# Patient Record
Sex: Male | Born: 1955 | Race: Black or African American | Hispanic: No | Marital: Married | Smoking: Former smoker
Health system: Southern US, Community
[De-identification: ages and names within clinical notes are randomized; demographics above are authoritative.]

## PROBLEM LIST (undated history)

## (undated) DIAGNOSIS — E785 Hyperlipidemia, unspecified: Secondary | ICD-10-CM

## (undated) DIAGNOSIS — Z794 Long term (current) use of insulin: Secondary | ICD-10-CM

## (undated) DIAGNOSIS — IMO0001 Reserved for inherently not codable concepts without codable children: Secondary | ICD-10-CM

## (undated) DIAGNOSIS — I700 Atherosclerosis of aorta: Secondary | ICD-10-CM

## (undated) DIAGNOSIS — R6882 Decreased libido: Secondary | ICD-10-CM

## (undated) DIAGNOSIS — I480 Paroxysmal atrial fibrillation: Secondary | ICD-10-CM

## (undated) DIAGNOSIS — E669 Obesity, unspecified: Secondary | ICD-10-CM

## (undated) DIAGNOSIS — Z9289 Personal history of other medical treatment: Secondary | ICD-10-CM

## (undated) DIAGNOSIS — I10 Essential (primary) hypertension: Secondary | ICD-10-CM

## (undated) DIAGNOSIS — E119 Type 2 diabetes mellitus without complications: Secondary | ICD-10-CM

## (undated) DIAGNOSIS — I5189 Other ill-defined heart diseases: Secondary | ICD-10-CM

## (undated) DIAGNOSIS — G4730 Sleep apnea, unspecified: Secondary | ICD-10-CM

## (undated) DIAGNOSIS — J029 Acute pharyngitis, unspecified: Secondary | ICD-10-CM

## (undated) DIAGNOSIS — D497 Neoplasm of unspecified behavior of endocrine glands and other parts of nervous system: Secondary | ICD-10-CM

## (undated) DIAGNOSIS — R079 Chest pain, unspecified: Secondary | ICD-10-CM

## (undated) HISTORY — DX: Long term (current) use of insulin: Z79.4

## (undated) HISTORY — DX: Sleep apnea, unspecified: G47.30

## (undated) HISTORY — DX: Reserved for inherently not codable concepts without codable children: IMO0001

## (undated) HISTORY — PX: AV FISTULA REPAIR: SHX563

## (undated) HISTORY — DX: Other ill-defined heart diseases: I51.89

## (undated) HISTORY — DX: Personal history of other medical treatment: Z92.89

## (undated) HISTORY — DX: Hyperlipidemia, unspecified: E78.5

## (undated) HISTORY — DX: Obesity, unspecified: E66.9

## (undated) HISTORY — DX: Type 2 diabetes mellitus without complications: E11.9

## (undated) HISTORY — DX: Paroxysmal atrial fibrillation: I48.0

## (undated) HISTORY — DX: Atherosclerosis of aorta: I70.0

## (undated) HISTORY — DX: Acute pharyngitis, unspecified: J02.9

## (undated) HISTORY — PX: CARDIAC CATHETERIZATION: SHX172

## (undated) HISTORY — DX: Neoplasm of unspecified behavior of endocrine glands and other parts of nervous system: D49.7

## (undated) HISTORY — DX: Essential (primary) hypertension: I10

## (undated) HISTORY — DX: Decreased libido: R68.82

## (undated) HISTORY — DX: Chest pain, unspecified: R07.9

---

## 2005-12-29 ENCOUNTER — Ambulatory Visit: Payer: Self-pay

## 2007-02-14 DIAGNOSIS — I10 Essential (primary) hypertension: Secondary | ICD-10-CM | POA: Insufficient documentation

## 2008-10-01 ENCOUNTER — Ambulatory Visit: Payer: Self-pay | Admitting: Gastroenterology

## 2010-07-31 ENCOUNTER — Ambulatory Visit: Payer: Self-pay | Admitting: Family Medicine

## 2011-05-18 HISTORY — PX: COLONOSCOPY: SHX174

## 2011-11-30 ENCOUNTER — Emergency Department: Payer: Self-pay | Admitting: Emergency Medicine

## 2011-11-30 LAB — BASIC METABOLIC PANEL
Anion Gap: 10 (ref 7–16)
BUN: 21 mg/dL — ABNORMAL HIGH (ref 7–18)
Co2: 22 mmol/L (ref 21–32)
Creatinine: 1.41 mg/dL — ABNORMAL HIGH (ref 0.60–1.30)
EGFR (African American): >60
EGFR (Non-African Amer.): 56 — ABNORMAL LOW
Glucose: 308 mg/dL — ABNORMAL HIGH (ref 65–99)
Osmolality: 285 (ref 275–301)

## 2011-11-30 LAB — CBC
HGB: 13.2 g/dL (ref 13.0–18.0)
MCHC: 31.8 g/dL — ABNORMAL LOW (ref 32.0–36.0)
MCV: 88 fL (ref 80–100)
Platelet: 211 10*3/uL (ref 150–440)
RDW: 11.9 % (ref 11.5–14.5)
WBC: 7.2 10*3/uL (ref 3.8–10.6)

## 2011-11-30 LAB — CK TOTAL AND CKMB (NOT AT ARMC): CK, Total: 388 U/L — ABNORMAL HIGH (ref 35–232)

## 2012-04-06 ENCOUNTER — Ambulatory Visit: Payer: Self-pay | Admitting: Family Medicine

## 2012-10-10 ENCOUNTER — Ambulatory Visit: Payer: Self-pay | Admitting: Family Medicine

## 2012-10-10 LAB — CREATININE, SERUM
Creatinine: 1.23 mg/dL (ref 0.60–1.30)
EGFR (African American): >60
EGFR (Non-African Amer.): >60

## 2012-10-23 ENCOUNTER — Ambulatory Visit: Payer: Self-pay | Admitting: Anesthesiology

## 2012-10-23 LAB — CBC
HCT: 40.1 % (ref 40.0–52.0)
HGB: 13.4 g/dL (ref 13.0–18.0)
MCH: 28.4 pg (ref 26.0–34.0)
MCHC: 33.4 g/dL (ref 32.0–36.0)
MCV: 85 fL (ref 80–100)
Platelet: 266 10*3/uL (ref 150–440)

## 2012-10-23 LAB — BASIC METABOLIC PANEL
Anion Gap: 8 (ref 7–16)
BUN: 15 mg/dL (ref 7–18)
Calcium, Total: 9.3 mg/dL (ref 8.5–10.1)
Chloride: 103 mmol/L (ref 98–107)
Creatinine: 1.18 mg/dL (ref 0.60–1.30)
EGFR (African American): >60
EGFR (Non-African Amer.): >60
Glucose: 287 mg/dL — ABNORMAL HIGH (ref 65–99)
Osmolality: 281 (ref 275–301)
Sodium: 135 mmol/L — ABNORMAL LOW (ref 136–145)

## 2012-10-26 ENCOUNTER — Ambulatory Visit: Payer: Self-pay | Admitting: Surgery

## 2012-10-27 LAB — PATHOLOGY REPORT

## 2014-04-22 ENCOUNTER — Emergency Department: Payer: Self-pay | Admitting: Emergency Medicine

## 2014-04-22 LAB — CBC
HCT: 43 % (ref 40.0–52.0)
HGB: 14 g/dL (ref 13.0–18.0)
MCH: 28.7 pg (ref 26.0–34.0)
MCHC: 32.6 g/dL (ref 32.0–36.0)
MCV: 88 fL (ref 80–100)
Platelet: 232 10*3/uL (ref 150–440)
RBC: 4.9 10*6/uL (ref 4.40–5.90)
RDW: 12.2 % (ref 11.5–14.5)
WBC: 7.8 10*3/uL (ref 3.8–10.6)

## 2014-04-22 LAB — BASIC METABOLIC PANEL
Anion Gap: 12 (ref 7–16)
BUN: 15 mg/dL (ref 7–18)
CALCIUM: 9 mg/dL (ref 8.5–10.1)
CHLORIDE: 95 mmol/L — AB (ref 98–107)
Co2: 25 mmol/L (ref 21–32)
Creatinine: 1.19 mg/dL (ref 0.60–1.30)
EGFR (Non-African Amer.): >60
Glucose: 284 mg/dL — ABNORMAL HIGH (ref 65–99)
Osmolality: 276 (ref 275–301)
POTASSIUM: 3.5 mmol/L (ref 3.5–5.1)
Sodium: 132 mmol/L — ABNORMAL LOW (ref 136–145)

## 2014-04-22 LAB — TROPONIN I: Troponin-I: <0.02 ng/mL

## 2014-04-26 ENCOUNTER — Encounter: Payer: Self-pay | Admitting: *Deleted

## 2014-05-03 ENCOUNTER — Ambulatory Visit: Payer: Self-pay | Admitting: Cardiovascular Disease

## 2014-05-16 ENCOUNTER — Encounter: Payer: Self-pay | Admitting: *Deleted

## 2014-05-23 ENCOUNTER — Ambulatory Visit (INDEPENDENT_AMBULATORY_CARE_PROVIDER_SITE_OTHER): Payer: BLUE CROSS/BLUE SHIELD | Admitting: Cardiovascular Disease

## 2014-05-23 ENCOUNTER — Encounter (INDEPENDENT_AMBULATORY_CARE_PROVIDER_SITE_OTHER): Payer: Self-pay

## 2014-05-23 ENCOUNTER — Telehealth: Payer: Self-pay | Admitting: *Deleted

## 2014-05-23 ENCOUNTER — Encounter: Payer: Self-pay | Admitting: Cardiovascular Disease

## 2014-05-23 VITALS — BP 130/80 | HR 84 | Ht 75.0 in | Wt 275.2 lb

## 2014-05-23 DIAGNOSIS — G4733 Obstructive sleep apnea (adult) (pediatric): Secondary | ICD-10-CM | POA: Insufficient documentation

## 2014-05-23 DIAGNOSIS — E785 Hyperlipidemia, unspecified: Secondary | ICD-10-CM

## 2014-05-23 DIAGNOSIS — R079 Chest pain, unspecified: Secondary | ICD-10-CM

## 2014-05-23 DIAGNOSIS — G4730 Sleep apnea, unspecified: Secondary | ICD-10-CM

## 2014-05-23 DIAGNOSIS — I10 Essential (primary) hypertension: Secondary | ICD-10-CM | POA: Insufficient documentation

## 2014-05-23 NOTE — Progress Notes (Signed)
Primary care physician: Dr. Rutherford Nail  HPI  This is a pleasant 59 year old African-American male who is here today for evaluation of chest pain. He has no previous cardiac history. He has chronic medical conditions that include type 2 diabetes diagnosed in 2000, hypertension, hyperlipidemia, obesity and sleep apnea. He used his CPAP machine in the past but stopped doing so after losing about 30 pounds. He went to the emergency room before Christmas for chest pain and generalized cramps. The chest pain is described as numbness, heavy feeling and occasional sharp discomfort substernally radiating to his left arm. The pain happen at rest and not with physical activities. He does complain of significant exertional fatigue and occasional shortness of breath. No orthopnea or PND. He does complain of feeling his pulse in his ears but no palpitations or tachycardia. His symptoms started after he was started on chlorthalidone. He went to the emergency room at [facility] his labs were unremarkable except for mild hyponatremia at 132 and borderline low potassium at 3.5. Cardiac enzymes were negative and EKG shows no acute changes. He was subsequently switched from chlorthalidone to [facility]. There is no family history of coronary artery disease. He is not a smoker.   Allergies  Allergen Reactions  . Atorvastatin   . Statins      Current Outpatient Prescriptions on File Prior to Visit  Medication Sig Dispense Refill  . diltiazem (CARDIZEM CD) 120 MG 24 hr capsule Take 120 mg by mouth daily.    . fluticasone (FLONASE) 50 MCG/ACT nasal spray Place 2 sprays into both nostrils as needed.     Marland Kitchen glipiZIDE (GLUCOTROL) 10 MG tablet Take 10 mg by mouth 2 (two) times daily before a meal.    . Liraglutide (VICTOZA) 18 MG/3ML SOPN Inject into the skin daily.    Marland Kitchen losartan (COZAAR) 100 MG tablet Take 100 mg by mouth daily.    . metFORMIN (GLUCOPHAGE) 1000 MG tablet Take 1,000 mg by mouth 2 (two) times daily with a meal.    .  sildenafil (VIAGRA) 100 MG tablet Take 100 mg by mouth daily as needed for erectile dysfunction.    Marland Kitchen zolpidem (AMBIEN) 10 MG tablet Take 10 mg by mouth at bedtime.     No current facility-administered medications on file prior to visit.     Past Medical History  Diagnosis Date  . Pharyngitis   . Hypertension   . Diabetes mellitus without complication   . Decreased libido      Past Surgical History  Procedure Laterality Date  . Colonoscopy    . Av fistula repair    . Cardiac catheterization      [facility]      Family History  Problem Relation Age of Onset  . Breast cancer Mother   . Kidney failure Father      History   Social History  . Marital Status: Married    Spouse Name: N/A    Number of Children: N/A  . Years of Education: N/A   Occupational History  . Not on file.   Social History Main Topics  . Smoking status: Never Smoker   . Smokeless tobacco: Not on file  . Alcohol Use: No  . Drug Use: No  . Sexual Activity: Not on file   Other Topics Concern  . Not on file   Social History Narrative     ROS A 10 point review of system was performed. It is negative other than that mentioned in the history of present illness.  PHYSICAL EXAM   BP 130/80 mmHg  Pulse 84  Ht 6\' 3"  (1.905 m)  Wt 275 lb 4 oz (124.853 kg)  BMI 34.40 kg/m2 Constitutional: He is oriented to person, place, and time. He appears well-developed and well-nourished. No distress.  HENT: No nasal discharge.  Head: Normocephalic and atraumatic.  Eyes: Pupils are equal and round.  No discharge. Neck: Normal range of motion. Neck supple. No JVD present. No thyromegaly present.  Cardiovascular: Normal rate, regular rhythm, normal heart sounds. Exam reveals no gallop and no friction rub. No murmur heard.  Pulmonary/Chest: Effort normal and breath sounds normal. No stridor. No respiratory distress. He has no wheezes. He has no rales. He exhibits no tenderness.  Abdominal: Soft. Bowel sounds  are normal. He exhibits no distension. There is no tenderness. There is no rebound and no guarding.  Musculoskeletal: Normal range of motion. He exhibits no edema and no tenderness.  Neurological: He is alert and oriented to person, place, and time. Coordination normal.  Skin: Skin is warm and dry. No rash noted. He is not diaphoretic. No erythema. No pallor.  Psychiatric: He has a normal mood and affect. His behavior is normal. Judgment and thought content normal.       NBZ:XYDSW  Rhythm  -  Diffuse nonspecific T-abnormality.   ABNORMAL     ASSESSMENT AND PLAN

## 2014-05-23 NOTE — Assessment & Plan Note (Signed)
Blood pressure is well controlled on current medications. 

## 2014-05-23 NOTE — Telephone Encounter (Signed)
Informed patient his stress test will be 05/29/14 Arrive at 0745 am  Patient verbalized understanding

## 2014-05-23 NOTE — Assessment & Plan Note (Signed)
His chest pain is mostly atypical. EKG does not show acute ischemic changes but he does have nonspecific T wave changes. He does have multiple risk factors for coronary artery disease and thus I recommend evaluation with a treadmill nuclear stress test. I discussed with the patient the importance of lifestyle changes in order to decrease the chance of future coronary artery disease and cardiovascular events. We discussed the importance of controlling risk factors, healthy diet as well as regular exercise. I also explained to him that a normal stress test does not rule out atherosclerosis.

## 2014-05-23 NOTE — Patient Instructions (Addendum)
[place]  Your caregiver has ordered a Stress Test with nuclear imaging. The purpose of this test is to evaluate the blood supply to your heart muscle. This procedure is referred to as a "Non-Invasive Stress Test." This is because other than having an IV started in your vein, nothing is inserted or "invades" your body. Cardiac stress tests are done to find areas of poor blood flow to the heart by determining the extent of coronary artery disease (CAD). Some patients exercise on a treadmill, which naturally increases the blood flow to your heart, while others who are  unable to walk on a treadmill due to physical limitations have a pharmacologic/chemical stress agent called Lexiscan . This medicine will mimic walking on a treadmill by temporarily increasing your coronary blood flow.   Please note: these test may take anywhere between 2-4 hours to complete  PLEASE REPORT TO [facility] AT THE FIRST DESK WILL DIRECT YOU WHERE TO GO  Date of Procedure:______01/13/16_______________________________  Arrival Time for Procedure:______0745am________________________  Instructions regarding medication:   __x__ : Hold diabetes medication morning of procedure  __x__:  Hold Diltiazem the morning of your procedure    PLEASE NOTIFY THE OFFICE AT LEAST 24 HOURS IN ADVANCE IF YOU ARE UNABLE TO KEEP YOUR APPOINTMENT.  [phone] AND  PLEASE NOTIFY NUCLEAR MEDICINE AT [facility] AT LEAST 24 HOURS IN ADVANCE IF YOU ARE UNABLE TO KEEP YOUR APPOINTMENT. [phone]  How to prepare for your Myoview test:  1. Do not eat or drink after midnight 2. No caffeine for 24 hours prior to test 3. No smoking 24 hours prior to test. 4. Your medication may be taken with water.  If your doctor stopped a medication because of this test, do not take that medication. 5. Ladies, please do not wear dresses.  Skirts or pants are appropriate. Please wear a short sleeve shirt. 6. No perfume, cologne  or lotion. 7. Wear comfortable walking shoes. No heels!        Your physician recommends that you schedule a follow-up appointment in:  As needed

## 2014-05-29 ENCOUNTER — Ambulatory Visit: Payer: Self-pay | Admitting: Cardiovascular Disease

## 2014-05-29 DIAGNOSIS — R079 Chest pain, unspecified: Secondary | ICD-10-CM

## 2014-05-30 ENCOUNTER — Other Ambulatory Visit: Payer: Self-pay

## 2014-05-30 DIAGNOSIS — R079 Chest pain, unspecified: Secondary | ICD-10-CM

## 2014-06-27 LAB — BASIC METABOLIC PANEL: GLUCOSE: 142 mg/dL

## 2014-06-27 LAB — HEMOGLOBIN A1C: Hgb A1c MFr Bld: 7.5 % — AB (ref 4.0–6.0)

## 2014-09-06 NOTE — Op Note (Signed)
PATIENT NAME:  CASSELL, VOORHIES MR#:  [id number] DATE OF BIRTH:  06-26-1955  DATE OF PROCEDURE:  10/26/2012  PREOPERATIVE DIAGNOSIS: Headaches, refractory.  POSTOPERATIVE DIAGNOSIS: Headaches, refractory.  PROCEDURE:  Right temporal artery biopsy.   SURGEON: Marlyce Huge, MD  ANESTHESIA:  MAC local.   ESTIMATED BLOOD LOSS: 5 mL.  COMPLICATIONS: None.   SPECIMENS: Temporal artery.   INDICATION FOR SURGERY: Mr. Gracey is a pleasant 59 year old male with a history of right-sided headaches which he described as severe which were slightly improved with prednisone, but which recurred after he was taken off prednisone. He was thus referred for temporal artery biopsy.   DETAILS OF PROCEDURE: Informed consent was obtained. The patient was brought to the operating room suite. He was laid on the operating room table and given mild sedation. His right face was prepped and draped in standard surgical fashion. A timeout was then performed correctly identifying the patient name, operative site and procedure to be performed. Doppler ultrasound was used to isolate his temporal artery. I made an incision over the artery just above the tragus and this was deepened down to the temporal artery. I dissected out approximately 1 cm of temporal artery, tied off all side branches, placed two 3-0 silk ties proximally and distally and tied these.  The specimen was then cut off from the tied ends and sent to pathology. I then irrigated the wound. It was hemostatic. I then closed it with a layer of 4-0 Monocryl deep dermals and then placed Dermabond over the wound. The patient was then awoken, extubated and brought to the postanesthesia care unit. There were no immediate complications. Needle, sponge and instrument counts were correct at the end of the procedure.   ____________________________ Glena Norfolk. Jordon Bourquin, MD cal:sb D: 10/27/2012 09:07:08 ET T: 10/27/2012 09:27:48 ET JOB#: [id number]  cc: Harrell Gave  A. Kaydan Wong, MD, <Dictator> Floyde Parkins MD ELECTRONICALLY SIGNED 11/02/2012 16:53

## 2014-09-09 ENCOUNTER — Encounter: Payer: Self-pay | Admitting: Emergency Medicine

## 2014-10-24 ENCOUNTER — Encounter: Payer: Self-pay | Admitting: Family Medicine

## 2014-10-24 ENCOUNTER — Ambulatory Visit (INDEPENDENT_AMBULATORY_CARE_PROVIDER_SITE_OTHER): Payer: 59 | Admitting: Family Medicine

## 2014-10-24 ENCOUNTER — Other Ambulatory Visit: Payer: Self-pay | Admitting: Family Medicine

## 2014-10-24 VITALS — BP 159/86 | HR 91 | Temp 98.2°F | Resp 18 | Ht 73.0 in | Wt 279.1 lb

## 2014-10-24 DIAGNOSIS — E1165 Type 2 diabetes mellitus with hyperglycemia: Secondary | ICD-10-CM

## 2014-10-24 DIAGNOSIS — I10 Essential (primary) hypertension: Secondary | ICD-10-CM | POA: Diagnosis not present

## 2014-10-24 DIAGNOSIS — G4700 Insomnia, unspecified: Secondary | ICD-10-CM

## 2014-10-24 DIAGNOSIS — N529 Male erectile dysfunction, unspecified: Secondary | ICD-10-CM

## 2014-10-24 DIAGNOSIS — IMO0002 Reserved for concepts with insufficient information to code with codable children: Secondary | ICD-10-CM

## 2014-10-24 DIAGNOSIS — E785 Hyperlipidemia, unspecified: Secondary | ICD-10-CM

## 2014-10-24 DIAGNOSIS — F419 Anxiety disorder, unspecified: Secondary | ICD-10-CM

## 2014-10-24 DIAGNOSIS — N5201 Erectile dysfunction due to arterial insufficiency: Secondary | ICD-10-CM | POA: Insufficient documentation

## 2014-10-24 LAB — POCT GLYCOSYLATED HEMOGLOBIN (HGB A1C): HEMOGLOBIN A1C: 9.3

## 2014-10-24 LAB — GLUCOSE, POCT (MANUAL RESULT ENTRY): POC Glucose: 255 mg/dl — AB (ref 70–99)

## 2014-10-24 MED ORDER — TADALAFIL 20 MG PO TABS: 10.0000 mg | ORAL_TABLET | Freq: Every day | ORAL | Status: DC | PRN

## 2014-10-24 MED ORDER — LORAZEPAM 0.5 MG PO TABS
0.5000 mg | ORAL_TABLET | Freq: Two times a day (BID) | ORAL | Status: DC | PRN
Start: 2014-10-24 — End: 2015-05-01

## 2014-10-24 MED ORDER — TRIAMTERENE-HCTZ 37.5-25 MG PO TABS: 1.0000 | ORAL_TABLET | Freq: Every day | ORAL | Status: DC

## 2014-10-24 MED ORDER — TADALAFIL 20 MG PO TABS: 10.0000 mg | ORAL_TABLET | ORAL | Status: DC | PRN

## 2014-10-24 MED ORDER — LOSARTAN POTASSIUM 100 MG PO TABS: 100.0000 mg | ORAL_TABLET | Freq: Every day | ORAL | Status: DC

## 2014-10-24 MED ORDER — PRAVASTATIN SODIUM 40 MG PO TABS: 40.0000 mg | ORAL_TABLET | Freq: Every day | ORAL | Status: DC

## 2014-10-24 MED ORDER — LIRAGLUTIDE 18 MG/3ML ~~LOC~~ SOPN: 1.0000 mg | PEN_INJECTOR | Freq: Every day | SUBCUTANEOUS | Status: DC

## 2014-10-24 MED ORDER — OMEPRAZOLE 20 MG PO CPDR: 20.0000 mg | DELAYED_RELEASE_CAPSULE | Freq: Every morning | ORAL | Status: DC

## 2014-10-24 MED ORDER — ZOLPIDEM TARTRATE 10 MG PO TABS: 10.0000 mg | ORAL_TABLET | Freq: Every day | ORAL | Status: DC

## 2014-10-24 MED ORDER — GLIPIZIDE 10 MG PO TABS: 10.0000 mg | ORAL_TABLET | Freq: Two times a day (BID) | ORAL | Status: DC

## 2014-10-24 MED ORDER — INDAPAMIDE 2.5 MG PO TABS
2.5000 mg | ORAL_TABLET | Freq: Every day | ORAL | Status: DC
Start: 2014-10-24 — End: 2015-09-26

## 2014-10-24 MED ORDER — METFORMIN HCL 1000 MG PO TABS: 1000.0000 mg | ORAL_TABLET | Freq: Two times a day (BID) | ORAL | Status: DC

## 2014-10-24 MED ORDER — INDAPAMIDE 2.5 MG PO TABS: 2.5000 mg | ORAL_TABLET | Freq: Every day | ORAL | Status: DC

## 2014-10-24 NOTE — Progress Notes (Signed)
Name: Marvin KIRKER Sr.   MRN: [id number]    DOB: 13-Dec-1955   Date:10/24/2014       Progress Note  Subjective  Chief Complaint  Chief Complaint  Patient presents with  . Hypertension  . Hyperlipidemia  . Insomnia  . Diabetes    Hypertension This is a chronic problem. The current episode started more than 1 year ago. The problem has been gradually worsening since onset. Associated symptoms include anxiety, chest pain, headaches and peripheral edema. Pertinent negatives include no blurred vision, neck pain, orthopnea, palpitations or shortness of breath. Risk factors for coronary artery disease include diabetes mellitus, dyslipidemia, obesity, male gender, stress and sedentary lifestyle. Past treatments include calcium channel blockers, beta blockers, diuretics and angiotensin blockers. The current treatment provides mild improvement. Compliance problems include medication cost and psychosocial issues.  Hypertensive end-organ damage includes kidney disease.  Hyperlipidemia This is a chronic problem. The current episode started more than 1 year ago. The problem is uncontrolled. Recent lipid tests were reviewed and are high. Exacerbating diseases include diabetes and obesity. Associated symptoms include chest pain. Pertinent negatives include no focal weakness, myalgias or shortness of breath. Current antihyperlipidemic treatment includes statins. The current treatment provides mild improvement of lipids. Compliance problems include medication cost.  Risk factors for coronary artery disease include diabetes mellitus, dyslipidemia, hypertension, obesity, a sedentary lifestyle and stress.  Diabetes He presents for his follow-up diabetic visit. He has type 2 diabetes mellitus. His disease course has been worsening. Hypoglycemia symptoms include headaches and nervousness/anxiousness. Pertinent negatives for hypoglycemia include no dizziness, seizures or tremors. Associated symptoms include chest pain.  Pertinent negatives for diabetes include no blurred vision, no weakness and no weight loss. Risk factors for coronary artery disease include diabetes mellitus, hypertension and sedentary lifestyle. Current diabetic treatment includes oral agent (dual therapy). He is compliant with treatment some of the time. He rarely participates in exercise. His home blood glucose trend is increasing steadily. An ACE inhibitor/angiotensin II receptor blocker is being taken.  Anxiety Presents for initial visit. Onset was 1 to 6 months ago. The problem has been gradually worsening. Symptoms include chest pain and nervous/anxious behavior. Patient reports no dizziness, insomnia, nausea, palpitations or shortness of breath. Symptoms occur occasionally. The severity of symptoms is moderate. The symptoms are aggravated by caffeine. The quality of sleep is fair.   Past treatments include nothing.   OBESITY Patient continues to be noncompliant with diet and exercise  ANXIETY Significant stressors recent as patient has been terminated from his job. He is currently seeking employment elsewhere. He has manifestations of anxiety and insomnia     Past Medical History  Diagnosis Date  . Pharyngitis   . Hypertension   . Diabetes mellitus without complication   . Decreased libido     History  Substance Use Topics  . Smoking status: Former Research scientist (life sciences)  . Smokeless tobacco: Not on file  . Alcohol Use: 0.0 oz/week    0 Standard drinks or equivalent per week     Comment: previously but not currently     Current outpatient prescriptions:  .  aspirin 81 MG tablet, Take 81 mg by mouth daily., Disp: , Rfl:  .  Cholecalciferol (VITAMIN D) 2000 UNITS tablet, Take 2,000 Units by mouth daily., Disp: , Rfl:  .  Flaxseed, Linseed, (FLAXSEED OIL) 1000 MG CAPS, Take by mouth daily., Disp: , Rfl:  .  fluticasone (FLONASE) 50 MCG/ACT nasal spray, Place 2 sprays into both nostrils as needed. , Disp: ,  Rfl:  .  glipiZIDE (GLUCOTROL)  10 MG tablet, Take 10 mg by mouth 2 (two) times daily before a meal., Disp: , Rfl:  .  Liraglutide (VICTOZA) 18 MG/3ML SOPN, Inject into the skin daily., Disp: , Rfl:  .  losartan (COZAAR) 100 MG tablet, Take 100 mg by mouth daily., Disp: , Rfl:  .  metFORMIN (GLUCOPHAGE) 1000 MG tablet, Take 1,000 mg by mouth 2 (two) times daily with a meal., Disp: , Rfl:  .  Omega-3 Fatty Acids (FISH OIL) 1000 MG CAPS, Take by mouth daily., Disp: , Rfl:  .  omeprazole (PRILOSEC) 20 MG capsule, TAKE ONE CAPSULE BY MOUTH IN THE MORNING, Disp: 30 capsule, Rfl: 11 .  pravastatin (PRAVACHOL) 40 MG tablet, Take 40 mg by mouth daily., Disp: , Rfl:  .  triamterene-hydrochlorothiazide (MAXZIDE-25) 37.5-25 MG per tablet, Take 1 tablet by mouth daily., Disp: , Rfl:  .  zolpidem (AMBIEN) 10 MG tablet, Take 10 mg by mouth at bedtime., Disp: , Rfl:  .  diltiazem (CARDIZEM CD) 120 MG 24 hr capsule, Take 120 mg by mouth daily., Disp: , Rfl:  .  sildenafil (VIAGRA) 100 MG tablet, Take 100 mg by mouth daily as needed for erectile dysfunction., Disp: , Rfl:   Allergies  Allergen Reactions  . Atorvastatin Other (See Comments)    Statins cause headaches and muscle aches    Review of Systems  Constitutional: Negative for fever, chills and weight loss.  HENT: Negative for congestion, hearing loss, sore throat and tinnitus.   Eyes: Negative for blurred vision, double vision and redness.  Respiratory: Negative for cough, hemoptysis and shortness of breath.   Cardiovascular: Positive for chest pain. Negative for palpitations, orthopnea, claudication and leg swelling.  Gastrointestinal: Negative for heartburn, nausea, vomiting, diarrhea, constipation and blood in stool.  Genitourinary: Negative for dysuria, urgency, frequency and hematuria.  Musculoskeletal: Negative for myalgias, back pain, joint pain, falls and neck pain.  Skin: Negative for itching.  Neurological: Positive for headaches. Negative for dizziness, tingling,  tremors, focal weakness, seizures, loss of consciousness and weakness. Sensory change: in chest.  Endo/Heme/Allergies: Does not bruise/bleed easily.  Psychiatric/Behavioral: Negative for depression and substance abuse. The patient is nervous/anxious. The patient does not have insomnia.      Objective  Filed Vitals:   10/24/14 1517  BP: 159/86  Pulse: 91  Temp: 98.2 F (36.8 C)  Resp: 18  Height: 6\' 1"  (1.854 m)  Weight: 279 lb 1 oz (126.582 kg)  SpO2: 97%     Physical Exam  Constitutional: He is oriented to person, place, and time and well-developed, well-nourished, and in no distress.  HENT:  Head: Normocephalic.  Eyes: EOM are normal. Pupils are equal, round, and reactive to light.  Neck: Normal range of motion. Neck supple. No thyromegaly present.  Cardiovascular: Normal rate, regular rhythm and normal heart sounds.   No murmur heard. Pulmonary/Chest: Effort normal and breath sounds normal. No respiratory distress. He has no wheezes.  Abdominal: Soft. Bowel sounds are normal.  Musculoskeletal: Normal range of motion. He exhibits no edema.  Lymphadenopathy:    He has no cervical adenopathy.  Neurological: He is alert and oriented to person, place, and time. No cranial nerve deficit. Gait normal. Coordination normal.  Skin: Skin is warm and dry. No rash noted.  Psychiatric: Affect and judgment normal.      Assessment & Plan  1. DM (diabetes mellitus), type 2, uncontrolled Continue compliance with diet and exercise to some degree medication -  POCT HgB A1C - POCT Glucose (CBG)  2. Hyperlipidemia  uncontrolled  - Lipid panel - TSH  3. Essential hypertension Uncontrolled and will add indapamide as a diuretic to his regimen - Comprehensive metabolic panel - indapamide (LOZOL) 2.5 MG tablet; Take 1 tablet (2.5 mg total) by mouth daily.  Dispense: 30 tablet; Refill: 5  4. Acute anxiety Related particularly to loss of job and economic stressors. We give trial of  Ativan - LORazepam (ATIVAN) 0.5 MG tablet; Take 1 tablet (0.5 mg total) by mouth 2 (two) times daily as needed for anxiety.  Dispense: 30 tablet; Refill: 1  5. Insomnia Worsened with recent loss of his job - zolpidem (AMBIEN) 10 MG tablet; Take 1 tablet (10 mg total) by mouth at bedtime.  Dispense: 30 tablet; Refill: 5  6. Erectile dysfunction, unspecified erectile dysfunction type Multifactorial - tadalafil (CIALIS) 20 MG tablet; Take 0.5-1 tablets (10-20 mg total) by mouth daily as needed for erectile dysfunction.  Dispense: 5 tablet; Refill: 11

## 2014-10-29 LAB — COMPREHENSIVE METABOLIC PANEL
A/G RATIO: 1.4 (ref 1.1–2.5)
ALT: 12 IU/L (ref 0–44)
AST: 13 IU/L (ref 0–40)
Albumin: 4.2 g/dL (ref 3.5–5.5)
Alkaline Phosphatase: 99 IU/L (ref 39–117)
BILIRUBIN TOTAL: 0.4 mg/dL (ref 0.0–1.2)
BUN/Creatinine Ratio: 14 (ref 9–20)
BUN: 18 mg/dL (ref 6–24)
CHLORIDE: 92 mmol/L — AB (ref 97–108)
CO2: 23 mmol/L (ref 18–29)
Calcium: 9.7 mg/dL (ref 8.7–10.2)
Creatinine, Ser: 1.31 mg/dL — ABNORMAL HIGH (ref 0.76–1.27)
GFR, EST AFRICAN AMERICAN: 69 mL/min/{1.73_m2} (ref >59)
GFR, EST NON AFRICAN AMERICAN: 60 mL/min/{1.73_m2} (ref >59)
Globulin, Total: 2.9 g/dL (ref 1.5–4.5)
Glucose: 305 mg/dL — ABNORMAL HIGH (ref 65–99)
Potassium: 4.2 mmol/L (ref 3.5–5.2)
Sodium: 133 mmol/L — ABNORMAL LOW (ref 134–144)
TOTAL PROTEIN: 7.1 g/dL (ref 6.0–8.5)

## 2014-10-29 LAB — TSH: TSH: 1.81 u[IU]/mL (ref 0.450–4.500)

## 2014-10-29 LAB — LIPID PANEL
Chol/HDL Ratio: 4.9 ratio units (ref 0.0–5.0)
Cholesterol, Total: 180 mg/dL (ref 100–199)
HDL: 37 mg/dL — ABNORMAL LOW (ref >39)
LDL CALC: 114 mg/dL — AB (ref 0–99)
TRIGLYCERIDES: 147 mg/dL (ref 0–149)
VLDL Cholesterol Cal: 29 mg/dL (ref 5–40)

## 2014-10-30 ENCOUNTER — Other Ambulatory Visit: Payer: Self-pay | Admitting: Family Medicine

## 2014-11-26 ENCOUNTER — Other Ambulatory Visit: Payer: Self-pay | Admitting: Emergency Medicine

## 2014-11-26 ENCOUNTER — Telehealth: Payer: Self-pay | Admitting: Family Medicine

## 2014-11-26 DIAGNOSIS — E119 Type 2 diabetes mellitus without complications: Secondary | ICD-10-CM

## 2014-11-26 MED ORDER — INSULIN PEN NEEDLE 32G X 6 MM MISC: 1.0000 | Freq: Once | Status: DC

## 2014-11-26 NOTE — Telephone Encounter (Signed)
Requesting a refill on his needles for victoza send to [facility] [address]

## 2015-01-02 ENCOUNTER — Telehealth: Payer: Self-pay | Admitting: Family Medicine

## 2015-01-02 ENCOUNTER — Other Ambulatory Visit: Payer: Self-pay | Admitting: Family Medicine

## 2015-01-02 MED ORDER — LIRAGLUTIDE 18 MG/3ML ~~LOC~~ SOPN: 1.0000 mg | PEN_INJECTOR | Freq: Every day | SUBCUTANEOUS | Status: DC

## 2015-01-02 NOTE — Telephone Encounter (Signed)
[facility] to pt pharmacy and sent refill

## 2015-01-02 NOTE — Telephone Encounter (Signed)
PT NEEDS VICTOZA REFILL AND NEEDS TO GO TO [facility]

## 2015-01-07 ENCOUNTER — Other Ambulatory Visit: Payer: Self-pay | Admitting: Family Medicine

## 2015-01-24 ENCOUNTER — Telehealth: Payer: Self-pay | Admitting: Family Medicine

## 2015-01-24 MED ORDER — LOSARTAN POTASSIUM 100 MG PO TABS: 100.0000 mg | ORAL_TABLET | Freq: Every day | ORAL | Status: DC

## 2015-01-24 MED ORDER — PRAVASTATIN SODIUM 40 MG PO TABS: 40.0000 mg | ORAL_TABLET | Freq: Every day | ORAL | Status: DC

## 2015-01-24 NOTE — Telephone Encounter (Signed)
Pt would like refill on Losartan and Prevastatin to be sent to [facility].

## 2015-01-24 NOTE — Telephone Encounter (Signed)
Sent to [facility].

## 2015-01-26 ENCOUNTER — Other Ambulatory Visit: Payer: Self-pay | Admitting: Family Medicine

## 2015-02-03 ENCOUNTER — Telehealth: Payer: Self-pay | Admitting: Family Medicine

## 2015-02-03 ENCOUNTER — Encounter: Payer: Self-pay | Admitting: Family Medicine

## 2015-02-03 NOTE — Telephone Encounter (Signed)
Requesting refill on Losartan and Pravastatin. Please send to [facility]. Please call once complete.

## 2015-02-04 MED ORDER — LOSARTAN POTASSIUM 100 MG PO TABS: 100.0000 mg | ORAL_TABLET | Freq: Every day | ORAL | Status: DC

## 2015-02-04 MED ORDER — PRAVASTATIN SODIUM 40 MG PO TABS: 40.0000 mg | ORAL_TABLET | Freq: Every day | ORAL | Status: DC

## 2015-02-04 NOTE — Telephone Encounter (Signed)
Patient informed. Thank you

## 2015-02-04 NOTE — Telephone Encounter (Signed)
Done

## 2015-02-07 ENCOUNTER — Other Ambulatory Visit: Payer: Self-pay | Admitting: Family Medicine

## 2015-02-10 ENCOUNTER — Encounter: Payer: Self-pay | Admitting: Family Medicine

## 2015-02-11 ENCOUNTER — Telehealth: Payer: Self-pay | Admitting: Family Medicine

## 2015-02-12 ENCOUNTER — Other Ambulatory Visit: Payer: Self-pay | Admitting: Family Medicine

## 2015-02-12 NOTE — Telephone Encounter (Signed)
ERRENOUS °

## 2015-02-26 ENCOUNTER — Telehealth: Payer: Self-pay | Admitting: Family Medicine

## 2015-02-26 MED ORDER — GLIPIZIDE 10 MG PO TABS: ORAL_TABLET | ORAL | Status: DC

## 2015-02-26 NOTE — Telephone Encounter (Signed)
Patient informed. 

## 2015-02-26 NOTE — Telephone Encounter (Signed)
Requesting refill on Glipizide, please send to [facility]. Patient has appointment for his annual physical for 03-06-15

## 2015-02-26 NOTE — Telephone Encounter (Signed)
Medication has been sent to pharmacy.  °

## 2015-03-06 ENCOUNTER — Ambulatory Visit (INDEPENDENT_AMBULATORY_CARE_PROVIDER_SITE_OTHER): Payer: 59 | Admitting: Family Medicine

## 2015-03-06 ENCOUNTER — Encounter: Payer: Self-pay | Admitting: Family Medicine

## 2015-03-06 VITALS — BP 158/82 | HR 87 | Temp 98.3°F | Resp 16 | Ht 73.0 in | Wt 272.4 lb

## 2015-03-06 DIAGNOSIS — IMO0002 Reserved for concepts with insufficient information to code with codable children: Secondary | ICD-10-CM | POA: Insufficient documentation

## 2015-03-06 DIAGNOSIS — E1165 Type 2 diabetes mellitus with hyperglycemia: Secondary | ICD-10-CM

## 2015-03-06 DIAGNOSIS — Z1211 Encounter for screening for malignant neoplasm of colon: Secondary | ICD-10-CM | POA: Diagnosis not present

## 2015-03-06 DIAGNOSIS — Z0000 Encounter for general adult medical examination without abnormal findings: Secondary | ICD-10-CM

## 2015-03-06 DIAGNOSIS — Z789 Other specified health status: Secondary | ICD-10-CM | POA: Insufficient documentation

## 2015-03-06 DIAGNOSIS — E1140 Type 2 diabetes mellitus with diabetic neuropathy, unspecified: Secondary | ICD-10-CM | POA: Insufficient documentation

## 2015-03-06 MED ORDER — GLIPIZIDE 10 MG PO TABS: ORAL_TABLET | ORAL | Status: DC

## 2015-03-06 MED ORDER — METFORMIN HCL 1000 MG PO TABS: 1000.0000 mg | ORAL_TABLET | Freq: Two times a day (BID) | ORAL | Status: DC

## 2015-03-06 MED ORDER — DILTIAZEM HCL ER COATED BEADS 120 MG PO CP24: 120.0000 mg | ORAL_CAPSULE | Freq: Every day | ORAL | Status: DC

## 2015-03-06 NOTE — Progress Notes (Signed)
Name: QUOC TOME Sr.   MRN: [id number]    DOB: 1955/05/22   Date:03/06/2015       Progress Note  Subjective  Chief Complaint  Chief Complaint  Patient presents with  . Annual Exam    HPI  59 year old male presents for annual H&P. Baseline problems are stable.    Past Medical History  Diagnosis Date  . Pharyngitis   . Hypertension   . Diabetes mellitus without complication ([place])   . Decreased libido     Social History  Substance Use Topics  . Smoking status: Former Research scientist (life sciences)  . Smokeless tobacco: Not on file  . Alcohol Use: 0.0 oz/week    0 Standard drinks or equivalent per week     Comment: previously but not currently     Current outpatient prescriptions:  .  aspirin 81 MG tablet, Take 81 mg by mouth daily., Disp: , Rfl:  .  Cholecalciferol (VITAMIN D) 2000 UNITS tablet, Take 2,000 Units by mouth daily., Disp: , Rfl:  .  diltiazem (CARTIA XT) 120 MG 24 hr capsule, Take 1 capsule (120 mg total) by mouth daily., Disp: 90 capsule, Rfl: 1 .  Flaxseed, Linseed, (FLAXSEED OIL) 1000 MG CAPS, Take by mouth daily., Disp: , Rfl:  .  fluticasone (FLONASE) 50 MCG/ACT nasal spray, Place 2 sprays into both nostrils as needed. , Disp: , Rfl:  .  glipiZIDE (GLUCOTROL) 10 MG tablet, TAKE ONE TABLET BY MOUTH TWICE DAILY BEFORE MEAL(S), Disp: 180 tablet, Rfl: 1 .  Insulin Pen Needle 32G X 6 MM MISC, 1 Package by Does not apply route once., Disp: 100 each, Rfl: 3 .  Liraglutide (VICTOZA) 18 MG/3ML SOPN, Inject 0.17 mLs (1.02 mg total) into the skin daily., Disp: 6 mL, Rfl: 11 .  losartan (COZAAR) 100 MG tablet, Take 1 tablet (100 mg total) by mouth daily., Disp: 30 tablet, Rfl: 3 .  metFORMIN (GLUCOPHAGE) 1000 MG tablet, Take 1 tablet (1,000 mg total) by mouth 2 (two) times daily with a meal., Disp: 180 tablet, Rfl: 1 .  Omega-3 Fatty Acids (FISH OIL) 1000 MG CAPS, Take by mouth daily., Disp: , Rfl:  .  omeprazole (PRILOSEC) 20 MG capsule, Take 1 capsule (20 mg total) by mouth every  morning., Disp: 30 capsule, Rfl: 11 .  pravastatin (PRAVACHOL) 40 MG tablet, Take 1 tablet (40 mg total) by mouth daily., Disp: 30 tablet, Rfl: 3 .  zolpidem (AMBIEN) 10 MG tablet, Take 1 tablet (10 mg total) by mouth at bedtime., Disp: 30 tablet, Rfl: 5 .  indapamide (LOZOL) 2.5 MG tablet, Take 1 tablet (2.5 mg total) by mouth daily. (Patient not taking: Reported on 03/06/2015), Disp: 30 tablet, Rfl: 5 .  LORazepam (ATIVAN) 0.5 MG tablet, Take 1 tablet (0.5 mg total) by mouth 2 (two) times daily as needed for anxiety. (Patient not taking: Reported on 03/06/2015), Disp: 30 tablet, Rfl: 1 .  tadalafil (CIALIS) 20 MG tablet, Take 0.5-1 tablets (10-20 mg total) by mouth daily as needed for erectile dysfunction. (Patient not taking: Reported on 03/06/2015), Disp: 5 tablet, Rfl: 11 .  triamterene-hydrochlorothiazide (MAXZIDE-25) 37.5-25 MG per tablet, Take 1 tablet by mouth daily. (Patient not taking: Reported on 03/06/2015), Disp: 30 tablet, Rfl: 3  Allergies  Allergen Reactions  . Atorvastatin Other (See Comments)    Statins cause headaches and muscle aches    Review of Systems  Constitutional: Negative for fever, chills and weight loss.  HENT: Negative for congestion, hearing loss, sore throat and tinnitus.  Eyes: Negative for blurred vision, double vision and redness.  Respiratory: Negative for cough, hemoptysis and shortness of breath.   Cardiovascular: Negative for chest pain, palpitations, orthopnea, claudication and leg swelling.  Gastrointestinal: Negative for heartburn, nausea, vomiting, diarrhea, constipation and blood in stool.  Genitourinary: Negative for dysuria, urgency, frequency and hematuria.  Musculoskeletal: Negative for myalgias, back pain, joint pain, falls and neck pain.  Skin: Negative for itching.  Neurological: Negative for dizziness, tingling, tremors, focal weakness, seizures, loss of consciousness, weakness and headaches.  Endo/Heme/Allergies: Does not bruise/bleed  easily.  Psychiatric/Behavioral: Negative for depression and substance abuse. The patient is not nervous/anxious and does not have insomnia.      Objective  Filed Vitals:   03/06/15 0920  BP: 158/82  Pulse: 87  Temp: 98.3 F (36.8 C)  Resp: 16  Height: 6\' 1"  (1.854 m)  Weight: 272 lb 7 oz (123.577 kg)  SpO2: 96%     Physical Exam  Constitutional: He is oriented to person, place, and time.  Obese male in no acute distress  HENT:  Head: Normocephalic.  Eyes: EOM are normal. Pupils are equal, round, and reactive to light.  Neck: Normal range of motion. Neck supple. No thyromegaly present.  Cardiovascular: Normal rate, regular rhythm and normal heart sounds.   No murmur heard. Pulmonary/Chest: Effort normal and breath sounds normal. No respiratory distress. He has no wheezes.  Abdominal: Soft. Bowel sounds are normal.  Genitourinary: Rectum normal, prostate normal and penis normal. Guaiac negative stool. No discharge found.  Musculoskeletal: Normal range of motion. He exhibits no edema.  Lymphadenopathy:    He has no cervical adenopathy.  Neurological: He is alert and oriented to person, place, and time. No cranial nerve deficit. Gait normal. Coordination normal.  Skin: Skin is warm and dry. No rash noted.  Psychiatric: Affect and judgment normal.      Assessment & Plan  1. Annual physical exam  - Ambulatory referral to diabetic education - CBC - Comprehensive Metabolic Panel (CMET) - Lipid Profile - TSH  2. Colon cancer screening  - POC Hemoccult Bld/Stl (1-Cd Office Dx) - POC Hemoccult Bld/Stl (1-Cd Office Dx)

## 2015-03-06 NOTE — Patient Instructions (Signed)
Exercising to Lose Weight Exercising can help you to lose weight. In order to lose weight through exercise, you need to do vigorous-intensity exercise. You can tell that you are exercising with vigorous intensity if you are breathing very hard and fast and cannot hold a conversation while exercising. Moderate-intensity exercise helps to maintain your current weight. You can tell that you are exercising at a moderate level if you have a higher heart rate and faster breathing, but you are still able to hold a conversation. HOW OFTEN SHOULD I EXERCISE? Choose an activity that you enjoy and set realistic goals. Your health care provider can help you to make an activity plan that works for you. Exercise regularly as directed by your health care provider. This may include:  Doing resistance training twice each week, such as:  Push-ups.  Sit-ups.  Lifting weights.  Using resistance bands.  Doing a given intensity of exercise for a given amount of time. Choose from these options:  150 minutes of moderate-intensity exercise every week.  75 minutes of vigorous-intensity exercise every week.  A mix of moderate-intensity and vigorous-intensity exercise every week. Children, pregnant women, people who are out of shape, people who are overweight, and older adults may need to consult a health care provider for individual recommendations. If you have any sort of medical condition, be sure to consult your health care provider before starting a new exercise program. WHAT ARE SOME ACTIVITIES THAT CAN HELP ME TO LOSE WEIGHT?   Walking at a rate of at least 4.5 miles an hour.  Jogging or running at a rate of 5 miles per hour.  Biking at a rate of at least 10 miles per hour.  Lap swimming.  Roller-skating or in-line skating.  Cross-country skiing.  Vigorous competitive sports, such as football, basketball, and soccer.  Jumping rope.  Aerobic dancing. HOW CAN I BE MORE ACTIVE IN MY DAY-TO-DAY  ACTIVITIES?  Use the stairs instead of the elevator.  Take a walk during your lunch break.  If you drive, park your car farther away from work or school.  If you take public transportation, get off one stop early and walk the rest of the way.  Make all of your phone calls while standing up and walking around.  Get up, stretch, and walk around every 30 minutes throughout the day. WHAT GUIDELINES SHOULD I FOLLOW WHILE EXERCISING?  Do not exercise so much that you hurt yourself, feel dizzy, or get very short of breath.  Consult your health care provider prior to starting a new exercise program.  Wear comfortable clothes and shoes with good support.  Drink plenty of water while you exercise to prevent dehydration or heat stroke. Body water is lost during exercise and must be replaced.  Work out until you breathe faster and your heart beats faster.   This information is not intended to replace advice given to you by your health care provider. Make sure you discuss any questions you have with your health care provider.   Document Released: 06/05/2010 Document Revised: 05/24/2014 Document Reviewed: 10/04/2013 Elsevier Interactive Patient Education 2016 [employer]. Obesity Obesity is defined as having too much total body fat and a body mass index (BMI) of 30 or more. BMI is an estimate of body fat and is calculated from your height and weight. BMI is typically calculated by your health care provider during regular wellness visits. Obesity happens when you consume more calories than you can burn by exercising or performing daily physical tasks.  Prolonged obesity can cause major illnesses or emergencies, such as:  Stroke.  Heart disease.  Diabetes.  Cancer.  Arthritis.  High blood pressure (hypertension).  High cholesterol.  Sleep apnea.  Erectile dysfunction.  Infertility problems. CAUSES   Regularly eating unhealthy foods.  Physical inactivity.  Certain disorders,  such as an underactive thyroid (hypothyroidism), Cushing's syndrome, and polycystic ovarian syndrome.  Certain medicines, such as steroids, some depression medicines, and antipsychotics.  Genetics.  Lack of sleep. DIAGNOSIS A health care provider can diagnose obesity after calculating your BMI. Obesity will be diagnosed if your BMI is 30 or higher. There are other methods of measuring obesity levels. Some other methods include measuring your skinfold thickness, your waist circumference, and comparing your hip circumference to your waist circumference. TREATMENT  A healthy treatment program includes some or all of the following:  Long-term dietary changes.  Exercise and physical activity.  Behavioral and lifestyle changes.  Medicine only under the supervision of your health care provider. Medicines may help, but only if they are used with diet and exercise programs. If your BMI is 40 or higher, your health care provider may recommend specialized surgery or programs to help with weight loss. An unhealthy treatment program includes:  Fasting.  Fad diets.  Supplements and drugs. These choices do not succeed in long-term weight control. HOME CARE INSTRUCTIONS  Exercise and perform physical activity as directed by your health care provider. To increase physical activity, try the following:  Use stairs instead of elevators.  Park farther away from store entrances.  Garden, bike, or walk instead of watching television or using the computer.  Eat healthy, low-calorie foods and drinks on a regular basis. Eat more fruits and vegetables. Use low-calorie cookbooks or take healthy cooking classes.  Limit fast food, sweets, and processed snack foods.  Eat smaller portions.  Keep a daily journal of everything you eat. There are many free websites to help you with this. It may be helpful to measure your foods so you can determine if you are eating the correct portion sizes.  Avoid  drinking alcohol. Drink more water and drinks without calories.  Take vitamins and supplements only as recommended by your health care provider.  Weight-loss support groups, Tax adviser, counselors, and stress reduction education can also be very helpful. SEEK IMMEDIATE MEDICAL CARE IF:  You have chest pain or tightness.  You have trouble breathing or feel short of breath.  You have weakness or leg numbness.  You feel confused or have trouble talking.  You have sudden changes in your vision.   This information is not intended to replace advice given to you by your health care provider. Make sure you discuss any questions you have with your health care provider.   Document Released: 06/10/2004 Document Revised: 05/24/2014 Document Reviewed: 06/09/2011 Elsevier Interactive Patient Education [employer].

## 2015-03-07 LAB — LIPID PANEL
CHOLESTEROL TOTAL: 146 mg/dL (ref 100–199)
Chol/HDL Ratio: 3.8 ratio units (ref 0.0–5.0)
HDL: 38 mg/dL — ABNORMAL LOW (ref >39)
LDL Calculated: 89 mg/dL (ref 0–99)
Triglycerides: 93 mg/dL (ref 0–149)
VLDL Cholesterol Cal: 19 mg/dL (ref 5–40)

## 2015-03-07 LAB — COMPREHENSIVE METABOLIC PANEL
A/G RATIO: 1.4 (ref 1.1–2.5)
ALBUMIN: 4.4 g/dL (ref 3.5–5.5)
ALK PHOS: 68 IU/L (ref 39–117)
ALT: 9 IU/L (ref 0–44)
AST: 15 IU/L (ref 0–40)
BILIRUBIN TOTAL: 0.5 mg/dL (ref 0.0–1.2)
BUN / CREAT RATIO: 14 (ref 9–20)
BUN: 17 mg/dL (ref 6–24)
CHLORIDE: 96 mmol/L — AB (ref 97–106)
CO2: 23 mmol/L (ref 18–29)
Calcium: 9.8 mg/dL (ref 8.7–10.2)
Creatinine, Ser: 1.22 mg/dL (ref 0.76–1.27)
GFR calc non Af Amer: 65 mL/min/{1.73_m2} (ref >59)
GFR, EST AFRICAN AMERICAN: 75 mL/min/{1.73_m2} (ref >59)
GLUCOSE: 127 mg/dL — AB (ref 65–99)
Globulin, Total: 3.1 g/dL (ref 1.5–4.5)
POTASSIUM: 4 mmol/L (ref 3.5–5.2)
SODIUM: 135 mmol/L — AB (ref 136–144)
TOTAL PROTEIN: 7.5 g/dL (ref 6.0–8.5)

## 2015-03-07 LAB — CBC
Hematocrit: 37.5 % (ref 37.5–51.0)
Hemoglobin: 12.4 g/dL — ABNORMAL LOW (ref 12.6–17.7)
MCH: 27.7 pg (ref 26.6–33.0)
MCHC: 33.1 g/dL (ref 31.5–35.7)
MCV: 84 fL (ref 79–97)
PLATELETS: 315 10*3/uL (ref 150–379)
RBC: 4.47 x10E6/uL (ref 4.14–5.80)
RDW: 12.5 % (ref 12.3–15.4)
WBC: 5.8 10*3/uL (ref 3.4–10.8)

## 2015-03-07 LAB — TSH: TSH: 1.21 u[IU]/mL (ref 0.450–4.500)

## 2015-03-12 ENCOUNTER — Telehealth: Payer: Self-pay | Admitting: Emergency Medicine

## 2015-03-12 NOTE — Telephone Encounter (Signed)
Patient notified of lab results

## 2015-03-14 ENCOUNTER — Other Ambulatory Visit: Payer: Self-pay | Admitting: Family Medicine

## 2015-04-02 ENCOUNTER — Telehealth: Payer: Self-pay | Admitting: Family Medicine

## 2015-04-02 ENCOUNTER — Encounter: Payer: Self-pay | Admitting: *Deleted

## 2015-04-02 ENCOUNTER — Encounter: Payer: 59 | Attending: Family Medicine | Admitting: *Deleted

## 2015-04-02 VITALS — BP 130/78 | Ht 73.0 in | Wt 274.4 lb

## 2015-04-02 DIAGNOSIS — E119 Type 2 diabetes mellitus without complications: Secondary | ICD-10-CM

## 2015-04-02 DIAGNOSIS — Z794 Long term (current) use of insulin: Principal | ICD-10-CM

## 2015-04-02 MED ORDER — INSULIN PEN NEEDLE 32G X 6 MM MISC
1.0000 | Freq: Once | Status: DC
Start: 2015-04-02 — End: 2019-10-04

## 2015-04-02 NOTE — Telephone Encounter (Signed)
Requesting 32G x 11mm needles to be sent to [facility]. He has about a week worth left

## 2015-04-02 NOTE — Patient Instructions (Addendum)
Check blood sugars 2 x day before breakfast and 2 hrs after supper 3-4 x week Exercise: Begin walking or other exercise for  15  minutes  3 days a week and gradually increase to 150 minutes/week Eat 3 meals day,  1-2  snacks a day Space meals 4-6 hours apart Bring blood sugar records to the next class

## 2015-04-02 NOTE — Progress Notes (Signed)
Diabetes Self-Management Education  Visit Type: First/Initial  Appt. Start Time: 850 Appt. End Time: [id number]  04/02/2015  [name], identified by name and date of birth, is a 59 y.o. male with a diagnosis of Diabetes: Type 2.   ASSESSMENT  Blood pressure 130/78, height 6\' 1"  (1.854 m), weight 274 lb 6.4 oz (124.467 kg). Body mass index is 36.21 kg/(m^2).      Diabetes Self-Management Education - 04/02/15 1131    Visit Information   Visit Type First/Initial   Initial Visit   Diabetes Type Type 2   Are you currently following a meal plan? Yes   What type of meal plan do you follow? drinking only water or unsweetened tea, eating vegetables, baked meat   Are you taking your medications as prescribed? Yes   Date Diagnosed 2000   Health Coping   How would you rate your overall health? Fair   Psychosocial Assessment   Patient Belief/Attitude about Diabetes Motivated to manage diabetes   Self-care barriers None   Self-management support Doctor's office;Family  Marvin Duncan   Patient Concerns Nutrition/Meal planning;Medication;Healthy Lifestyle;Glycemic Control;Weight Control;Monitoring   Special Needs None   Preferred Learning Style Auditory;Visual;Hands on   [place] in progress   How often do you need to have someone help you when you read instructions, pamphlets, or other written materials from your doctor or pharmacy? 1 - Never   What is the last grade level you completed in school? some college   Complications   Last HgB A1C per patient/outside source 9.3 %  10/24/14   How often do you check your blood sugar? 0 times/day (not testing)  He has a new meter but doesn't check blood sugars   Have you had a dilated eye exam in the past 12 months? Yes   Have you had a dental exam in the past 12 months? No   Are you checking your feet? Yes   How many days per week are you checking your feet? 7   Dietary Intake   Breakfast sausage patties or egg or bread or banana   Snack (morning) fruit   Lunch salads with chicken, potato chips   Dinner baked chicken, vegetables, occasional pizza or steak   Beverage(s) water   Exercise   Exercise Type ADL's  pt reports walking for 2 hrs on his job    Patient Education   Previous Diabetes Education No   Disease state  Definition of diabetes, type 1 and 2, and the diagnosis of diabetes   Nutrition management  Role of diet in the treatment of diabetes and the relationship between the three main macronutrients and blood glucose level   Physical activity and exercise  Role of exercise on diabetes management, blood pressure control and cardiac health.   Medications Reviewed patients medication for diabetes, action, purpose, timing of dose and side effects.   Monitoring Purpose and frequency of SMBG.;Identified appropriate SMBG and/or A1C goals.   Chronic complications Relationship between chronic complications and blood glucose control   Psychosocial adjustment Identified and addressed patients feelings and concerns about diabetes   Individualized Goals (developed by patient)   Reducing Risk Improve blood sugars Decrease medications Lose weight Lead a healthier lifestyle Become more fit   Outcomes   Expected Outcomes Demonstrated interest in learning. Expect positive outcomes      Individualized Plan for Diabetes Self-Management Training:   Learning Objective:  Patient will have a greater understanding of diabetes self-management. Patient education plan is to attend  individual and/or group sessions per assessed needs and concerns.   Plan:   Patient Instructions  Check blood sugars 2 x day before breakfast and 2 hrs after supper 3-4 x week Exercise: Begin walking or other exercise for  15  minutes  3 days a week and gradually increase to 150 minutes/week Eat 3 meals day,  1-2  snacks a day Space meals 4-6 hours apart Bring blood sugar records to the next class   Expected Outcomes:  Demonstrated interest in  learning. Expect positive outcomes  Education material provided: General Meal Planning Guidelines  If problems or questions, patient to contact team via:   Johny Drilling, [place], [place], CDE [phone]  Future DSME appointment:   April 14, 2015 for Class 1

## 2015-04-02 NOTE — Telephone Encounter (Signed)
Needles sent to [facility]

## 2015-04-08 ENCOUNTER — Ambulatory Visit (INDEPENDENT_AMBULATORY_CARE_PROVIDER_SITE_OTHER): Payer: 59 | Admitting: Family Medicine

## 2015-04-08 ENCOUNTER — Encounter: Payer: Self-pay | Admitting: Family Medicine

## 2015-04-08 VITALS — BP 122/76 | HR 88 | Temp 98.8°F | Resp 18 | Ht 73.0 in | Wt 274.3 lb

## 2015-04-08 DIAGNOSIS — E785 Hyperlipidemia, unspecified: Secondary | ICD-10-CM | POA: Diagnosis not present

## 2015-04-08 DIAGNOSIS — N528 Other male erectile dysfunction: Secondary | ICD-10-CM

## 2015-04-08 DIAGNOSIS — I10 Essential (primary) hypertension: Secondary | ICD-10-CM

## 2015-04-08 DIAGNOSIS — F419 Anxiety disorder, unspecified: Secondary | ICD-10-CM | POA: Diagnosis not present

## 2015-04-08 DIAGNOSIS — G4730 Sleep apnea, unspecified: Secondary | ICD-10-CM | POA: Diagnosis not present

## 2015-04-08 DIAGNOSIS — R809 Proteinuria, unspecified: Secondary | ICD-10-CM | POA: Diagnosis not present

## 2015-04-08 DIAGNOSIS — E1169 Type 2 diabetes mellitus with other specified complication: Secondary | ICD-10-CM | POA: Diagnosis not present

## 2015-04-08 DIAGNOSIS — E1129 Type 2 diabetes mellitus with other diabetic kidney complication: Secondary | ICD-10-CM

## 2015-04-08 LAB — POCT UA - MICROALBUMIN: Microalbumin Ur, POC: 100 mg/L

## 2015-04-08 LAB — GLUCOSE, POCT (MANUAL RESULT ENTRY): POC Glucose: 137 mg/dl — AB (ref 70–99)

## 2015-04-08 LAB — POCT GLYCOSYLATED HEMOGLOBIN (HGB A1C): Hemoglobin A1C: 7.3

## 2015-04-08 MED ORDER — FLUTICASONE PROPIONATE 50 MCG/ACT NA SUSP: 2.0000 | NASAL | Status: DC | PRN

## 2015-04-08 NOTE — Progress Notes (Signed)
Name: Marvin TIANO Sr.   MRN: [id number]    DOB: 01/01/1956   Date:04/08/2015       Progress Note  Subjective  Chief Complaint  Chief Complaint  Patient presents with  . Hypertension    4 month recheck  . Diabetes  . Hyperlipidemia    HPI  Hypertension   Patient presents for follow-up of hypertension. It has been present for over 5 years.  Patient states that there is compliance with medical regimen which consists of Maxide 25 losartan 100 diltiazem XT 120 daily indapamide 2.5 daily . There is no end organ disease. Cardiac risk factors include hypertension hyperlipidemia and diabetes.  Exercise regimen consist of minimal walking .  Diet consist of salt restriction  Diabetes  Patient presents for follow-up of diabetes which is present for over 5 years. Is currently on a regimen of metformin 1000 mg twice a day losartan 100 mg daily glipizide 10 mg daily and Victoza 18 mg daily . Patient states some compliance with their diet and exercise. There's been no hypoglycemic episodes and there is no polyuria polydipsia polyphagia. His average fasting glucoses been in the low around - with a high around - . There is no end organ disease.  Last diabetic eye exam was earlier this year.   Last visit with dietitian was within the last year. Last microalbumin was obtained today and is 100 .  Marland Kitchen  Hyperlipidemia  Patient has a history of hyperlipidemia for over 5 years.  Current medical regimen consist of omega-3 fatty episodes pravastatin 40 mg daily at bedtime .  Compliance is good .  Diet and exercise are currently followed fairly well .  Risk factors for cardiovascular disease include hyperlipidemia diabetes hypertension obesity .   There have been no side effects from the medication.    Past Medical History  Diagnosis Date  . Pharyngitis   . Hypertension   . Diabetes mellitus without complication ([place])   . Decreased libido   . Hyperlipidemia     Social History  Substance Use Topics  .  Smoking status: Former Smoker -- 0.25 packs/day for 25 years    Types: Cigarettes    Quit date: 05/17/1998  . Smokeless tobacco: Never Used     Comment: off and on - maybe 5 cigarettes  . Alcohol Use: No     Comment: previously but not currently     Current outpatient prescriptions:  .  aspirin 81 MG tablet, Take 81 mg by mouth daily., Disp: , Rfl:  .  Cholecalciferol (VITAMIN D) 2000 UNITS tablet, Take 2,000 Units by mouth daily., Disp: , Rfl:  .  diltiazem (CARTIA XT) 120 MG 24 hr capsule, Take 1 capsule (120 mg total) by mouth daily., Disp: 90 capsule, Rfl: 1 .  Flaxseed, Linseed, (FLAXSEED OIL) 1000 MG CAPS, Take 1,000 mg by mouth daily. , Disp: , Rfl:  .  fluticasone (FLONASE) 50 MCG/ACT nasal spray, Place 2 sprays into both nostrils as needed., Disp: 16 g, Rfl: 5 .  glipiZIDE (GLUCOTROL) 10 MG tablet, TAKE ONE TABLET BY MOUTH TWICE DAILY BEFORE MEAL(S), Disp: 180 tablet, Rfl: 1 .  indapamide (LOZOL) 2.5 MG tablet, Take 1 tablet (2.5 mg total) by mouth daily., Disp: 30 tablet, Rfl: 5 .  Insulin Pen Needle 32G X 6 MM MISC, 1 Package by Does not apply route once., Disp: 100 each, Rfl: 3 .  Liraglutide (VICTOZA) 18 MG/3ML SOPN, Inject 0.17 mLs (1.02 mg total) into the skin daily. (Patient taking  differently: Inject 1.2 mg into the skin daily. ), Disp: 6 mL, Rfl: 11 .  LORazepam (ATIVAN) 0.5 MG tablet, Take 1 tablet (0.5 mg total) by mouth 2 (two) times daily as needed for anxiety., Disp: 30 tablet, Rfl: 1 .  losartan (COZAAR) 100 MG tablet, Take 1 tablet (100 mg total) by mouth daily., Disp: 30 tablet, Rfl: 3 .  metFORMIN (GLUCOPHAGE) 1000 MG tablet, Take 1 tablet (1,000 mg total) by mouth 2 (two) times daily with a meal., Disp: 180 tablet, Rfl: 1 .  Omega-3 Fatty Acids (FISH OIL) 1000 MG CAPS, Take 1,000 mg by mouth daily. , Disp: , Rfl:  .  omeprazole (PRILOSEC) 20 MG capsule, Take 1 capsule (20 mg total) by mouth every morning., Disp: 30 capsule, Rfl: 11 .  pravastatin (PRAVACHOL) 40 MG  tablet, Take 1 tablet (40 mg total) by mouth daily., Disp: 30 tablet, Rfl: 3 .  tadalafil (CIALIS) 20 MG tablet, Take 0.5-1 tablets (10-20 mg total) by mouth daily as needed for erectile dysfunction. (Patient not taking: Reported on 03/06/2015), Disp: 5 tablet, Rfl: 11 .  triamterene-hydrochlorothiazide (MAXZIDE-25) 37.5-25 MG per tablet, Take 1 tablet by mouth daily. (Patient not taking: Reported on 03/06/2015), Disp: 30 tablet, Rfl: 3 .  zolpidem (AMBIEN) 10 MG tablet, Take 1 tablet (10 mg total) by mouth at bedtime., Disp: 30 tablet, Rfl: 5  Allergies  Allergen Reactions  . Atorvastatin Other (See Comments)    Statins cause headaches and muscle aches    Review of Systems  Constitutional: Negative for fever, chills and weight loss.  HENT: Negative for congestion, hearing loss, sore throat and tinnitus.   Eyes: Negative for blurred vision, double vision and redness.  Respiratory: Negative for cough, hemoptysis and shortness of breath.   Cardiovascular: Negative for chest pain, palpitations, orthopnea, claudication and leg swelling.  Gastrointestinal: Negative for heartburn, nausea, vomiting, diarrhea, constipation and blood in stool.  Genitourinary: Negative for dysuria, urgency, frequency and hematuria.  Musculoskeletal: Positive for back pain (left-sided left-sided sciatica). Negative for myalgias, joint pain, falls and neck pain (left-sided sciatica).  Skin: Negative for itching.  Neurological: Negative for dizziness, tingling, tremors, focal weakness, seizures, loss of consciousness, weakness and headaches.  Endo/Heme/Allergies: Does not bruise/bleed easily.  Psychiatric/Behavioral: Negative for depression and substance abuse. The patient is not nervous/anxious and does not have insomnia.      Objective  Filed Vitals:   04/08/15 0751  BP: 122/76  Pulse: 88  Temp: 98.8 F (37.1 C)  TempSrc: Oral  Resp: 18  Height: 6\' 1"  (1.854 m)  Weight: 274 lb 4.8 oz (124.422 kg)  SpO2:  97%     Physical Exam  Constitutional: He is oriented to person, place, and time and well-developed, well-nourished, and in no distress.  HENT:  Head: Normocephalic.  Eyes: EOM are normal. Pupils are equal, round, and reactive to light.  Neck: Normal range of motion. Neck supple. No thyromegaly present.  Cardiovascular: Normal rate, regular rhythm, normal heart sounds and intact distal pulses.   No murmur heard. Pulmonary/Chest: Effort normal and breath sounds normal. No respiratory distress. He has no wheezes.  Abdominal: Soft. Bowel sounds are normal.  Musculoskeletal: Normal range of motion. He exhibits no edema.  Lymphadenopathy:    He has no cervical adenopathy.  Neurological: He is alert and oriented to person, place, and time. No cranial nerve deficit. Gait normal. Coordination normal.  Skin: Skin is warm and dry. No rash noted.  Psychiatric: Affect and judgment normal.  Assessment & Plan   1. Type 2 diabetes mellitus with other specified complication (HCC) Improved  - POCT HgB A1C - POCT Glucose (CBG) - POCT UA - Microalbumin  2. Hyperlipidemia Not at goal encouraged dietary and exercise compliance  3. Essential hypertension At goal  4. Other male erectile dysfunction Continues  5. Acute anxiety Response to Ativan  6. Sleep apnea Continue CPAP  7. Diabetes mellitus with microalbuminuria (HCC) Avoidance of NSAIDs reassess in 6 months

## 2015-04-14 ENCOUNTER — Encounter: Payer: 59 | Admitting: Dietician

## 2015-04-14 VITALS — Ht 73.0 in | Wt 281.7 lb

## 2015-04-14 DIAGNOSIS — E119 Type 2 diabetes mellitus without complications: Secondary | ICD-10-CM

## 2015-04-14 NOTE — Progress Notes (Signed)

## 2015-04-21 ENCOUNTER — Encounter: Payer: 59 | Attending: Family Medicine | Admitting: Dietician

## 2015-04-21 VITALS — Wt 281.5 lb

## 2015-04-21 DIAGNOSIS — E119 Type 2 diabetes mellitus without complications: Secondary | ICD-10-CM | POA: Insufficient documentation

## 2015-04-21 NOTE — Progress Notes (Signed)
Appt. Start Time: 1730 Appt. End Time: 2030  Class 2 Diabetes Overview - define DM; state own type of DM; identify functions of pancreas and insulin; define insulin deficiency vs insulin resistance  Psychosocial - identify DM as a source of stress; state the effects of stress on BG control; verbalize appropriate stress management techniques; identify personal stress issues   Nutritional Management - describe effects of food on blood glucose; identify sources of carbohydrate, protein and fat; verbalize the importance of balance meals in controlling blood glucose; identify meals as well balanced or not; estimate servings of carbohydrate from menus; use food labels to identify servings size, content of carbohydrate, fiber, protein, fat, saturated fat and sodium; recognize food sources of fat, saturated fat, trans fat, sodium and verbalize goals for intake; describe healthful appropriate food choices when dining out-advised pt to eat a small bedtime snack to aide in controlling FBG's  Exercise - describe the effects of exercise on blood glucose and importance of regular exercise in controlling diabetes; state a plan for personal exercise; verbalize contraindications for exercise  Medications - state name, dose, timing of currently prescribed medications; describe types of medications available for diabetes  Self-Monitoring - state importance of HBGM and demo procedure accurately; use HBGM results to effectively manage diabetes; identify importance of regular HbA1C testing and goals for results  Acute Complications/Sick Day Guidelines - recognize hyperglycemia and hypoglycemia with causes and effects; identify blood glucose results as high, low or in control; list steps in treating and preventing high and low blood glucose; state appropriate measure to manage blood glucose when ill (need for meds, HBGM plan, when to call physician, need for fluids)  Chronic Complications/Foot, Skin, Eye Dental Care -  identify possible long-term complications of diabetes (retinopathy, neuropathy, nephropathy, cardiovascular disease, infections); explain steps in prevention and treatment of chronic complications; state importance of daily self-foot exams; describe how to examine feet and what to look for; explain appropriate eye and dental care  Lifestyle Changes/Goals & Health/Community Resources - state benefits of making appropriate lifestyle changes; identify habits that need to change (meals, tobacco, alcohol); identify strategies to reduce risk factors for personal health; set goals for proper diabetes care; state need for and frequency of healthcare follow-up; describe appropriate community resources for good health (ADA, web sites, apps)   Pregnancy/Sexual Health - define gestational diabetes; state importance of good blood glucose control and birth control prior to pregnancy; state importance of good blood glucose control in preventing sexual problems (impotence, vaginal dryness, infections, loss of desire); state relationship of blood glucose control and pregnancy outcome; describe risk of maternal and fetal complications  Teaching Materials Used: Class 2 Slide Packet A1C Pamphlet Foot Care Literature Menu Ideas Goals for Class 2

## 2015-04-25 ENCOUNTER — Other Ambulatory Visit: Payer: Self-pay | Admitting: Family Medicine

## 2015-04-28 ENCOUNTER — Encounter: Payer: 59 | Admitting: Dietician

## 2015-04-28 ENCOUNTER — Other Ambulatory Visit: Payer: Self-pay

## 2015-04-28 VITALS — BP 130/90 | Ht 73.0 in | Wt 280.1 lb

## 2015-04-28 DIAGNOSIS — E119 Type 2 diabetes mellitus without complications: Secondary | ICD-10-CM

## 2015-04-28 MED ORDER — PRAVASTATIN SODIUM 40 MG PO TABS: 40.0000 mg | ORAL_TABLET | Freq: Every day | ORAL | Status: DC

## 2015-04-28 MED ORDER — LOSARTAN POTASSIUM 100 MG PO TABS: 100.0000 mg | ORAL_TABLET | Freq: Every day | ORAL | Status: DC

## 2015-04-28 NOTE — Progress Notes (Signed)

## 2015-04-29 ENCOUNTER — Telehealth: Payer: Self-pay | Admitting: Family Medicine

## 2015-04-29 ENCOUNTER — Emergency Department
Admission: EM | Admit: 2015-04-29 | Discharge: 2015-04-29 | Disposition: A | Discharge status: Home / Self Care | Payer: 59 | Attending: Emergency Medicine | Admitting: Emergency Medicine

## 2015-04-29 ENCOUNTER — Encounter: Payer: Self-pay | Admitting: Medical Oncology

## 2015-04-29 ENCOUNTER — Other Ambulatory Visit: Payer: Self-pay

## 2015-04-29 ENCOUNTER — Emergency Department: Payer: 59

## 2015-04-29 DIAGNOSIS — I10 Essential (primary) hypertension: Secondary | ICD-10-CM | POA: Insufficient documentation

## 2015-04-29 DIAGNOSIS — Z79899 Other long term (current) drug therapy: Secondary | ICD-10-CM | POA: Insufficient documentation

## 2015-04-29 DIAGNOSIS — R079 Chest pain, unspecified: Secondary | ICD-10-CM | POA: Diagnosis not present

## 2015-04-29 DIAGNOSIS — R0789 Other chest pain: Secondary | ICD-10-CM | POA: Diagnosis present

## 2015-04-29 DIAGNOSIS — Z87891 Personal history of nicotine dependence: Secondary | ICD-10-CM | POA: Insufficient documentation

## 2015-04-29 DIAGNOSIS — E119 Type 2 diabetes mellitus without complications: Secondary | ICD-10-CM | POA: Diagnosis not present

## 2015-04-29 DIAGNOSIS — Z7982 Long term (current) use of aspirin: Secondary | ICD-10-CM | POA: Insufficient documentation

## 2015-04-29 DIAGNOSIS — Z7984 Long term (current) use of oral hypoglycemic drugs: Secondary | ICD-10-CM | POA: Diagnosis not present

## 2015-04-29 DIAGNOSIS — Z794 Long term (current) use of insulin: Secondary | ICD-10-CM | POA: Diagnosis not present

## 2015-04-29 LAB — TROPONIN I: Troponin I: <0.03 ng/mL (ref <0.031)

## 2015-04-29 LAB — CBC
HEMATOCRIT: 38.6 % — AB (ref 40.0–52.0)
Hemoglobin: 12.9 g/dL — ABNORMAL LOW (ref 13.0–18.0)
MCH: 28.3 pg (ref 26.0–34.0)
MCHC: 33.3 g/dL (ref 32.0–36.0)
MCV: 85 fL (ref 80.0–100.0)
PLATELETS: 241 10*3/uL (ref 150–440)
RBC: 4.54 MIL/uL (ref 4.40–5.90)
RDW: 12.4 % (ref 11.5–14.5)
WBC: 6.2 10*3/uL (ref 3.8–10.6)

## 2015-04-29 LAB — BASIC METABOLIC PANEL
Anion gap: 10 (ref 5–15)
BUN: 20 mg/dL (ref 6–20)
CHLORIDE: 98 mmol/L — AB (ref 101–111)
CO2: 26 mmol/L (ref 22–32)
CREATININE: 1.2 mg/dL (ref 0.61–1.24)
Calcium: 9.5 mg/dL (ref 8.9–10.3)
GFR calc non Af Amer: >60 mL/min (ref >60)
Glucose, Bld: 224 mg/dL — ABNORMAL HIGH (ref 65–99)
POTASSIUM: 4 mmol/L (ref 3.5–5.1)
SODIUM: 134 mmol/L — AB (ref 135–145)

## 2015-04-29 NOTE — Discharge Instructions (Signed)
As we discussed your EKG today has not changed from one performed in January which is very reassuring. Please seek medical attention for any high fevers, chest pain, shortness of breath, change in behavior, persistent vomiting, bloody stool or any other new or concerning symptoms.   Nonspecific Chest Pain  Chest pain can be caused by many different conditions. There is always a chance that your pain could be related to something serious, such as a heart attack or a blood clot in your lungs. Chest pain can also be caused by conditions that are not life-threatening. If you have chest pain, it is very important to follow up with your health care provider. CAUSES  Chest pain can be caused by:  Heartburn.  Pneumonia or bronchitis.  Anxiety or stress.  Inflammation around your heart (pericarditis) or lung (pleuritis or pleurisy).  A blood clot in your lung.  A collapsed lung (pneumothorax). It can develop suddenly on its own (spontaneous pneumothorax) or from trauma to the chest.  Shingles infection (varicella-zoster virus).  Heart attack.  Damage to the bones, muscles, and cartilage that make up your chest wall. This can include:  Bruised bones due to injury.  Strained muscles or cartilage due to frequent or repeated coughing or overwork.  Fracture to one or more ribs.  Sore cartilage due to inflammation (costochondritis). RISK FACTORS  Risk factors for chest pain may include:  Activities that increase your risk for trauma or injury to your chest.  Respiratory infections or conditions that cause frequent coughing.  Medical conditions or overeating that can cause heartburn.  Heart disease or family history of heart disease.  Conditions or health behaviors that increase your risk of developing a blood clot.  Having had chicken pox (varicella zoster). SIGNS AND SYMPTOMS Chest pain can feel like:  Burning or tingling on the surface of your chest or deep in your  chest.  Crushing, pressure, aching, or squeezing pain.  Dull or sharp pain that is worse when you move, cough, or take a deep breath.  Pain that is also felt in your back, neck, shoulder, or arm, or pain that spreads to any of these areas. Your chest pain may come and go, or it may stay constant. DIAGNOSIS Lab tests or other studies may be needed to find the cause of your pain. Your health care provider may have you take a test called an ambulatory ECG (electrocardiogram). An ECG records your heartbeat patterns at the time the test is performed. You may also have other tests, such as:  Transthoracic echocardiogram (TTE). During echocardiography, sound waves are used to create a picture of all of the heart structures and to look at how blood flows through your heart.  Transesophageal echocardiogram (TEE).This is a more advanced imaging test that obtains images from inside your body. It allows your health care provider to see your heart in finer detail.  Cardiac monitoring. This allows your health care provider to monitor your heart rate and rhythm in real time.  Holter monitor. This is a portable device that records your heartbeat and can help to diagnose abnormal heartbeats. It allows your health care provider to track your heart activity for several days, if needed.  Stress tests. These can be done through exercise or by taking medicine that makes your heart beat more quickly.  Blood tests.  Imaging tests. TREATMENT  Your treatment depends on what is causing your chest pain. Treatment may include:  Medicines. These may include:  Acid blockers for heartburn.  Anti-inflammatory medicine.  Pain medicine for inflammatory conditions.  Antibiotic medicine, if an infection is present.  Medicines to dissolve blood clots.  Medicines to treat coronary artery disease.  Supportive care for conditions that do not require medicines. This may include:  Resting.  Applying heat or cold  packs to injured areas.  Limiting activities until pain decreases. HOME CARE INSTRUCTIONS  If you were prescribed an antibiotic medicine, finish it all even if you start to feel better.  Avoid any activities that bring on chest pain.  Do not use any tobacco products, including cigarettes, chewing tobacco, or electronic cigarettes. If you need help quitting, ask your health care provider.  Do not drink alcohol.  Take medicines only as directed by your health care provider.  Keep all follow-up visits as directed by your health care provider. This is important. This includes any further testing if your chest pain does not go away.  If heartburn is the cause for your chest pain, you may be told to keep your head raised (elevated) while sleeping. This reduces the chance that acid will go from your stomach into your esophagus.  Make lifestyle changes as directed by your health care provider. These may include:  Getting regular exercise. Ask your health care provider to suggest some activities that are safe for you.  Eating a heart-healthy diet. A registered dietitian can help you to learn healthy eating options.  Maintaining a healthy weight.  Managing diabetes, if necessary.  Reducing stress. SEEK MEDICAL CARE IF:  Your chest pain does not go away after treatment.  You have a rash with blisters on your chest.  You have a fever. SEEK IMMEDIATE MEDICAL CARE IF:   Your chest pain is worse.  You have an increasing cough, or you cough up blood.  You have severe abdominal pain.  You have severe weakness.  You faint.  You have chills.  You have sudden, unexplained chest discomfort.  You have sudden, unexplained discomfort in your arms, back, neck, or jaw.  You have shortness of breath at any time.  You suddenly start to sweat, or your skin gets clammy.  You feel nauseous or you vomit.  You suddenly feel light-headed or dizzy.  Your heart begins to beat quickly, or  it feels like it is skipping beats. These symptoms may represent a serious problem that is an emergency. Do not wait to see if the symptoms will go away. Get medical help right away. Call your local emergency services (911 in the [place] Do not drive yourself to the hospital.   This information is not intended to replace advice given to you by your health care provider. Make sure you discuss any questions you have with your health care provider.   Document Released: 02/10/2005 Document Revised: 05/24/2014 Document Reviewed: 12/07/2013 Elsevier Interactive Patient Education [employer].

## 2015-04-29 NOTE — Telephone Encounter (Signed)
Did pt leave number to [facility] so that I may request results? And pt should contact whomever his cardiologist is and request his results because I do not see any results in his chart.

## 2015-04-29 NOTE — Telephone Encounter (Signed)
Patient had a normal EKG about 6 months ago but just had one completed due to job employment and it came back  Abnormal by [facility]. They are requesting his results from the cardiologist. Please fax it to [phone].he is requesting that it be faxed today because his job depends on it. He also scheduled an appointment for Thursday and asked that you request the abnormal EKG from [facility]

## 2015-04-29 NOTE — ED Notes (Signed)

## 2015-04-29 NOTE — ED Notes (Signed)
Pt reports that he has been having central chest heaviness since February, pain has worsened recently but reports he went for pre- employment screening this am and was told to follow-up in ed bc there appeared to be some changes.

## 2015-04-29 NOTE — ED Provider Notes (Signed)
[facility] Emergency Department Provider Note   ____________________________________________  Time seen: 1920  I have reviewed the triage vital signs and the nursing notes.   HISTORY  Chief Complaint Chest Pain   History limited by: Not Limited   HPI Marvin CORSINO Sr. is a 59 y.o. male who presents to the emergency department because of concerns for chest discomfort and abnormal EKG. 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 He states he been feeling off throughout the day and then started feeling worse in the afternoon. He states he has been having some chest pain. He describes it as intermittent episodes of sharp pain committee by some chest pressure. He denies any shortness breath or diaphoresis. He denies any recent fevers. He states he has had similar symptoms and had a workup in the beginning of this ear. He states he underwent a stress test which did not show any concerning abnormalities.    Past Medical History  Diagnosis Date  . Pharyngitis   . Hypertension   . Diabetes mellitus without complication ([place])   . Decreased libido   . Hyperlipidemia     Patient Active Problem List   Diagnosis Date Noted  . Drug intolerance 03/06/2015  . Adult BMI 30+ 03/06/2015  . Diabetes mellitus, type 2 ([place]) 03/06/2015  . Acute anxiety 10/24/2014  . ED (erectile dysfunction) 10/24/2014  . Pain in the chest 05/23/2014  . Essential hypertension 05/23/2014  . Hyperlipidemia 05/23/2014  . Sleep apnea 05/23/2014  . Benign essential HTN 02/14/2007    Past Surgical History  Procedure Laterality Date  . Colonoscopy    . Av fistula repair    . Cardiac catheterization      [facility]     Current Outpatient Rx  Name  Route  Sig  Dispense  Refill  . aspirin 81 MG tablet   Oral   Take 81 mg by mouth daily.         . Cholecalciferol (VITAMIN D) 2000 UNITS tablet   Oral   Take 2,000  Units by mouth daily.         Marland Kitchen diltiazem (CARTIA XT) 120 MG 24 hr capsule   Oral   Take 1 capsule (120 mg total) by mouth daily.   90 capsule   1   . Flaxseed, Linseed, (FLAXSEED OIL) 1000 MG CAPS   Oral   Take 1,000 mg by mouth daily.          . fluticasone (FLONASE) 50 MCG/ACT nasal spray   Each Nare   Place 2 sprays into both nostrils as needed.   16 g   5   . glipiZIDE (GLUCOTROL) 10 MG tablet      TAKE ONE TABLET BY MOUTH TWICE DAILY BEFORE MEAL(S)   180 tablet   1   . indapamide (LOZOL) 2.5 MG tablet   Oral   Take 1 tablet (2.5 mg total) by mouth daily.   30 tablet   5   . Insulin Pen Needle 32G X 6 MM MISC   Does not apply   1 Package by Does not apply route once.   100 each   3     E11.9 dx code Diabetes Type 2   . Liraglutide (VICTOZA) 18 MG/3ML SOPN   Subcutaneous   Inject 0.17 mLs (1.02 mg total) into the skin daily. Patient taking differently: Inject 1.2 mg into the skin daily.  6 mL   11   . LORazepam (ATIVAN) 0.5 MG tablet   Oral   Take 1 tablet (0.5 mg total) by mouth 2 (two) times daily as needed for anxiety.   30 tablet   1   . losartan (COZAAR) 100 MG tablet   Oral   Take 1 tablet (100 mg total) by mouth daily.   90 tablet   0   . metFORMIN (GLUCOPHAGE) 1000 MG tablet   Oral   Take 1 tablet (1,000 mg total) by mouth 2 (two) times daily with a meal.   180 tablet   1   . Omega-3 Fatty Acids (FISH OIL) 1000 MG CAPS   Oral   Take 1,000 mg by mouth daily.          Marland Kitchen omeprazole (PRILOSEC) 20 MG capsule   Oral   Take 1 capsule (20 mg total) by mouth every morning.   30 capsule   11   . pravastatin (PRAVACHOL) 40 MG tablet   Oral   Take 1 tablet (40 mg total) by mouth daily.   90 tablet   0   . tadalafil (CIALIS) 20 MG tablet   Oral   Take 0.5-1 tablets (10-20 mg total) by mouth daily as needed for erectile dysfunction. Patient not taking: Reported on 03/06/2015   5 tablet   11   . triamterene-hydrochlorothiazide  (MAXZIDE-25) 37.5-25 MG per tablet   Oral   Take 1 tablet by mouth daily. Patient not taking: Reported on 03/06/2015   30 tablet   3   . zolpidem (AMBIEN) 10 MG tablet      TAKE ONE TABLET BY MOUTH AT BEDTIME   30 tablet   0     Allergies Atorvastatin  Family History  Problem Relation Age of Onset  . Breast cancer Mother   . Kidney failure Father   . Diabetes Father     Social History Social History  Substance Use Topics  . Smoking status: Former Smoker -- 0.25 packs/day for 25 years    Types: Cigarettes    Quit date: 05/17/1998  . Smokeless tobacco: Never Used     Comment: off and on - maybe 5 cigarettes  . Alcohol Use: No     Comment: previously but not currently    Review of Systems  Constitutional: Negative for fever. Cardiovascular: Positive for chest pain. Respiratory: Negative for shortness of breath. Gastrointestinal: Negative for abdominal pain, vomiting and diarrhea. Neurological: Negative for headaches, focal weakness or numbness.   10-point ROS otherwise negative.  ____________________________________________   PHYSICAL EXAM:  VITAL SIGNS: ED Triage Vitals  Enc Vitals Group     BP 04/29/15 1724 136/74 mmHg     Pulse Rate 04/29/15 1724 82     Resp 04/29/15 1724 17     Temp 04/29/15 1724 97.5 F (36.4 C)     Temp Source 04/29/15 1724 Oral     SpO2 04/29/15 1724 96 %     Weight 04/29/15 1719 274 lb (124.286 kg)     Height 04/29/15 1719 6\' 1"  (1.854 m)     Head Cir --      Peak Flow --      Pain Score 04/29/15 1720 6   Constitutional: Alert and oriented. Well appearing and in no distress. Eyes: Conjunctivae are normal. PERRL. Normal extraocular movements. ENT   Head: Normocephalic and atraumatic.   Nose: No congestion/rhinnorhea.   Mouth/Throat: Mucous membranes are moist.   Neck: No stridor. Hematological/Lymphatic/Immunilogical: No  cervical lymphadenopathy. Cardiovascular: Normal rate, regular rhythm.  No murmurs,  rubs, or gallops. Respiratory: Normal respiratory effort without tachypnea nor retractions. Breath sounds are clear and equal bilaterally. No wheezes/rales/rhonchi. Gastrointestinal: Soft and nontender. No distention. There is no CVA tenderness. Genitourinary: Deferred Musculoskeletal: Normal range of motion in all extremities. No joint effusions.  No lower extremity tenderness nor edema. Neurologic:  Normal speech and language. No gross focal neurologic deficits are appreciated.  Skin:  Skin is warm, dry and intact. No rash noted. Psychiatric: Mood and affect are normal. Speech and behavior are normal. Patient exhibits appropriate insight and judgment.  ____________________________________________    LABS (pertinent positives/negatives)  Labs Reviewed  BASIC METABOLIC PANEL - Abnormal; Notable for the following:    Sodium 134 (*)    Chloride 98 (*)    Glucose, Bld 224 (*)    All other components within normal limits  CBC - Abnormal; Notable for the following:    Hemoglobin 12.9 (*)    HCT 38.6 (*)    All other components within normal limits  TROPONIN I     ____________________________________________   EKG  I, Nance Pear, attending physician, personally viewed and interpreted this EKG  EKG Time: 1721 Rate: 82 Rhythm: nsR Axis: NORMAL Intervals: qtc 404 QRS: narrow ST changes: no st elevation Impression: normal ekg  Not significantly changed from EKG dated 05/23/2014 ____________________________________________    RADIOLOGY  CXR  IMPRESSION: No acute cardiopulmonary disease.  ____________________________________________   PROCEDURES  Procedure(s) performed: None  Critical Care performed: No  ____________________________________________   INITIAL IMPRESSION / ASSESSMENT AND PLAN / ED COURSE  Pertinent labs & imaging results that were available during my care of the patient were reviewed by me and considered in my medical decision making (see  chart for details).  Patient presented to the emergency department today with concerns for chest discomfort and abnormal EKG. EKG today appears to be unchanged from EKG obtained in June or this year. Patient's discomfort was described as intermittent sharp pain without any shortness of breath or diaphoresis. I doubt that the patient's symptoms represent ACS initially given negative troponin and the fact that the symptoms been going on and off and were present for most of the day. Additionally patient has not had any fevers. Chest x-ray was negative. Did discuss with patient's parents following up with primary care doctor and he can recontact cardiology. Discussed return precautions with patient and family.  ____________________________________________   FINAL CLINICAL IMPRESSION(S) / ED DIAGNOSES  Final diagnoses:  Chest pain, unspecified chest pain type     Nance Pear, MD 04/29/15 2025

## 2015-04-30 NOTE — Telephone Encounter (Signed)
Earl called again to give the number [phone]

## 2015-04-30 NOTE — Telephone Encounter (Signed)
Patient returned my call stating that he has appointment tomorrow with Dr Rutherford Nail in the morning and tomorrow afternoon with the cardiologist. He stated that he went to ER last night just to make sure everything was okay. They told him that everything was normal for him that there was no change from his last EKG with the hospital. He will bring the number to [facility] for [place] to request his EKG from them tomorrow.

## 2015-04-30 NOTE — Telephone Encounter (Signed)
Called number provided and they will fax over a copy of results EKG

## 2015-05-01 ENCOUNTER — Encounter: Payer: Self-pay | Admitting: Family Medicine

## 2015-05-01 ENCOUNTER — Ambulatory Visit (INDEPENDENT_AMBULATORY_CARE_PROVIDER_SITE_OTHER): Payer: 59 | Admitting: Physician Assistant

## 2015-05-01 ENCOUNTER — Ambulatory Visit (INDEPENDENT_AMBULATORY_CARE_PROVIDER_SITE_OTHER): Payer: 59 | Admitting: Family Medicine

## 2015-05-01 ENCOUNTER — Other Ambulatory Visit: Payer: Self-pay | Admitting: Family Medicine

## 2015-05-01 ENCOUNTER — Encounter: Payer: Self-pay | Admitting: Physician Assistant

## 2015-05-01 VITALS — BP 142/60 | HR 67 | Ht 73.5 in | Wt 280.0 lb

## 2015-05-01 VITALS — BP 142/86 | HR 104 | Temp 98.0°F | Resp 18 | Ht 73.0 in | Wt 280.8 lb

## 2015-05-01 DIAGNOSIS — G4730 Sleep apnea, unspecified: Secondary | ICD-10-CM

## 2015-05-01 DIAGNOSIS — I10 Essential (primary) hypertension: Secondary | ICD-10-CM | POA: Diagnosis not present

## 2015-05-01 DIAGNOSIS — R079 Chest pain, unspecified: Secondary | ICD-10-CM

## 2015-05-01 DIAGNOSIS — E668 Other obesity: Secondary | ICD-10-CM | POA: Diagnosis not present

## 2015-05-01 DIAGNOSIS — IMO0002 Reserved for concepts with insufficient information to code with codable children: Secondary | ICD-10-CM

## 2015-05-01 DIAGNOSIS — E119 Type 2 diabetes mellitus without complications: Secondary | ICD-10-CM

## 2015-05-01 DIAGNOSIS — F419 Anxiety disorder, unspecified: Secondary | ICD-10-CM

## 2015-05-01 DIAGNOSIS — E785 Hyperlipidemia, unspecified: Secondary | ICD-10-CM

## 2015-05-01 DIAGNOSIS — N528 Other male erectile dysfunction: Secondary | ICD-10-CM | POA: Diagnosis not present

## 2015-05-01 DIAGNOSIS — R9431 Abnormal electrocardiogram [ECG] [EKG]: Secondary | ICD-10-CM | POA: Diagnosis not present

## 2015-05-01 DIAGNOSIS — R072 Precordial pain: Secondary | ICD-10-CM | POA: Diagnosis not present

## 2015-05-01 DIAGNOSIS — Z794 Long term (current) use of insulin: Secondary | ICD-10-CM

## 2015-05-01 DIAGNOSIS — IMO0001 Reserved for inherently not codable concepts without codable children: Secondary | ICD-10-CM

## 2015-05-01 DIAGNOSIS — E1100 Type 2 diabetes mellitus with hyperosmolarity without nonketotic hyperglycemic-hyperosmolar coma (NKHHC): Secondary | ICD-10-CM | POA: Diagnosis not present

## 2015-05-01 DIAGNOSIS — E1142 Type 2 diabetes mellitus with diabetic polyneuropathy: Secondary | ICD-10-CM | POA: Insufficient documentation

## 2015-05-01 NOTE — Patient Instructions (Addendum)
Medication Instructions:  Your physician recommends that you continue on your current medications as directed. Please refer to the Current Medication list given to you today.   Labwork: none  Testing/Procedures: Your physician has requested that you have a lexiscan myoview. For further information please visit [email or web address]. Please follow instruction sheet, as given.  [place]  Your caregiver has ordered a Stress Test with nuclear imaging. The purpose of this test is to evaluate the blood supply to your heart muscle. This procedure is referred to as a "Non-Invasive Stress Test." This is because other than having an IV started in your vein, nothing is inserted or "invades" your body. Cardiac stress tests are done to find areas of poor blood flow to the heart by determining the extent of coronary artery disease (CAD). Some patients exercise on a treadmill, which naturally increases the blood flow to your heart, while others who are  unable to walk on a treadmill due to physical limitations have a pharmacologic/chemical stress agent called Lexiscan . This medicine will mimic walking on a treadmill by temporarily increasing your coronary blood flow.   Please note: these test may take anywhere between 2-4 hours to complete  PLEASE REPORT TO [place] AT THE FIRST DESK WILL DIRECT YOU WHERE TO GO  Date of Procedure:_____Tuesday, Jan 10________________________________  Arrival Time for Procedure:_________7:15am___________________  Instructions regarding medication:   ___xx_ : Hold diabetes medication morning of procedure. Hold glipizide 24 hours before test. Hold metformin 24 hours before and 48 hours after test. Do not take morning insulin. Take 1/2 dose of victoza the evening before  PLEASE NOTIFY THE OFFICE AT LEAST 24 HOURS IN ADVANCE IF YOU ARE UNABLE TO KEEP YOUR APPOINTMENT.  [phone] AND  PLEASE NOTIFY NUCLEAR MEDICINE AT [facility] AT LEAST 24  HOURS IN ADVANCE IF YOU ARE UNABLE TO KEEP YOUR APPOINTMENT. [phone]  How to prepare for your Myoview test:   Do not eat or drink after midnight  No caffeine for 24 hours prior to test  No smoking 24 hours prior to test.  Your medication may be taken with water.  If your doctor stopped a medication because of this test, do not take that medication.  Ladies, please do not wear dresses.  Skirts or pants are appropriate. Please wear a short sleeve shirt.  No perfume, cologne or lotion.  Wear comfortable walking shoes. No heels!            Follow-Up: Your physician recommends that you schedule a follow-up appointment after treadmill myoview with Christell Faith, PA-C   Any Other Special Instructions Will Be Listed Below (If Applicable).     If you need a refill on your cardiac medications before your next appointment, please call your pharmacy.  Cardiac Nuclear Scanning A cardiac nuclear scan is used to check your heart for problems, such as the following:  A portion of the heart is not getting enough blood.  Part of the heart muscle has died, which happens with a heart attack.  The heart wall is not working normally.  In this test, a radioactive dye (tracer) is injected into your bloodstream. After the tracer has traveled to your heart, a scanning device is used to measure how much of the tracer is absorbed by or distributed to various areas of your heart. LET [facility] CARE PROVIDER KNOW ABOUT:  Any allergies you have.  All medicines you are taking, including vitamins, herbs, eye drops, creams, and over-the-counter medicines.  Previous  problems you or members of your family have had with the use of anesthetics.  Any blood disorders you have.  Previous surgeries you have had.  Medical conditions you have.  RISKS AND COMPLICATIONS Generally, this is a safe procedure. However, as with any procedure, problems can occur. Possible problems include:   Serious  chest pain.  Rapid heartbeat.  Sensation of warmth in your chest. This usually passes quickly. BEFORE THE PROCEDURE Ask your health care provider about changing or stopping your regular medicines. PROCEDURE This procedure is usually done at a hospital and takes 2-4 hours.  An IV tube is inserted into one of your veins.  Your health care provider will inject a small amount of radioactive tracer through the tube.  You will then wait for 20-40 minutes while the tracer travels through your bloodstream.  You will lie down on an exam table so images of your heart can be taken. Images will be taken for about 15-20 minutes.  You will exercise on a treadmill or stationary bike. While you exercise, your heart activity will be monitored with an electrocardiogram (ECG), and your blood pressure will be checked.  If you are unable to exercise, you may be given a medicine to make your heart beat faster.  When blood flow to your heart has peaked, tracer will again be injected through the IV tube.  After 20-40 minutes, you will get back on the exam table and have more images taken of your heart.  When the procedure is over, your IV tube will be removed. AFTER THE PROCEDURE  You will likely be able to leave shortly after the test. Unless your health care provider tells you otherwise, you may return to your normal schedule, including diet, activities, and medicines.  Make sure you find out how and when you will get your test results.   This information is not intended to replace advice given to you by your health care provider. Make sure you discuss any questions you have with your health care provider.   Document Released: 05/28/2004 Document Revised: 05/08/2013 Document Reviewed: 04/11/2013 Elsevier Interactive Patient Education [employer].

## 2015-05-01 NOTE — Progress Notes (Signed)
Name: Marvin HUGER Sr.   MRN: [id number]    DOB: 1956/02/21   Date:05/01/2015       Progress Note  Subjective  Chief Complaint  Chief Complaint  Patient presents with  . 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 He has had some intermittent mild chest discomfort. There is no nausea vomiting palpitations orthopnea PND or extremity swelling. He is on a schedule for stress test this afternoon.  Past Medical History  Diagnosis Date  . Pharyngitis   . Hypertension   . Diabetes mellitus without complication ([place])   . Decreased libido   . Hyperlipidemia     Social History  Substance Use Topics  . Smoking status: Former Smoker -- 0.25 packs/day for 25 years    Types: Cigarettes    Quit date: 05/17/1998  . Smokeless tobacco: Never Used     Comment: off and on - maybe 5 cigarettes  . Alcohol Use: No     Comment: previously but not currently     Current outpatient prescriptions:  .  aspirin 81 MG tablet, Take 81 mg by mouth daily., Disp: , Rfl:  .  Cholecalciferol (VITAMIN D) 2000 UNITS tablet, Take 2,000 Units by mouth daily., Disp: , Rfl:  .  diltiazem (CARTIA XT) 120 MG 24 hr capsule, Take 1 capsule (120 mg total) by mouth daily., Disp: 90 capsule, Rfl: 1 .  Flaxseed, Linseed, (FLAXSEED OIL) 1000 MG CAPS, Take 1,000 mg by mouth daily. , Disp: , Rfl:  .  fluticasone (FLONASE) 50 MCG/ACT nasal spray, Place 2 sprays into both nostrils as needed., Disp: 16 g, Rfl: 5 .  glipiZIDE (GLUCOTROL) 10 MG tablet, TAKE ONE TABLET BY MOUTH TWICE DAILY BEFORE MEAL(S), Disp: 180 tablet, Rfl: 1 .  indapamide (LOZOL) 2.5 MG tablet, Take 1 tablet (2.5 mg total) by mouth daily., Disp: 30 tablet, Rfl: 5 .  Insulin Pen Needle 32G X 6 MM MISC, 1 Package by Does not apply route once., Disp: 100 each, Rfl: 3 .  Liraglutide (VICTOZA)  18 MG/3ML SOPN, Inject 0.17 mLs (1.02 mg total) into the skin daily. (Patient taking differently: Inject 1.2 mg into the skin daily. ), Disp: 6 mL, Rfl: 11 .  LORazepam (ATIVAN) 0.5 MG tablet, Take 1 tablet (0.5 mg total) by mouth 2 (two) times daily as needed for anxiety., Disp: 30 tablet, Rfl: 1 .  losartan (COZAAR) 100 MG tablet, Take 1 tablet (100 mg total) by mouth daily., Disp: 90 tablet, Rfl: 0 .  metFORMIN (GLUCOPHAGE) 1000 MG tablet, Take 1 tablet (1,000 mg total) by mouth 2 (two) times daily with a meal., Disp: 180 tablet, Rfl: 1 .  Omega-3 Fatty Acids (FISH OIL) 1000 MG CAPS, Take 1,000 mg by mouth daily. , Disp: , Rfl:  .  omeprazole (PRILOSEC) 20 MG capsule, Take 1 capsule (20 mg total) by mouth every morning., Disp: 30 capsule, Rfl: 11 .  pravastatin (PRAVACHOL) 40 MG tablet, Take 1 tablet (40 mg total) by mouth daily., Disp: 90 tablet, Rfl: 0 .  tadalafil (CIALIS) 20 MG tablet, Take 0.5-1 tablets (10-20 mg total) by mouth daily as needed for erectile dysfunction. (Patient not taking: Reported on 03/06/2015), Disp: 5 tablet, Rfl: 11 .  triamterene-hydrochlorothiazide (MAXZIDE-25) 37.5-25 MG per tablet, Take 1 tablet by mouth daily. (Patient not taking:  Reported on 03/06/2015), Disp: 30 tablet, Rfl: 3 .  zolpidem (AMBIEN) 10 MG tablet, TAKE ONE TABLET BY MOUTH AT BEDTIME, Disp: 30 tablet, Rfl: 0  Allergies  Allergen Reactions  . Atorvastatin Other (See Comments)    Statins cause headaches and muscle aches    Review of Systems  Constitutional: Negative for fever, chills and weight loss.  HENT: Negative for congestion, hearing loss, sore throat and tinnitus.   Eyes: Negative for blurred vision, double vision and redness.  Respiratory: Negative for cough, hemoptysis and shortness of breath.   Cardiovascular: Positive for chest pain (with EKG showing T-wave abnormalities). Negative for palpitations, orthopnea, claudication and leg swelling.  Gastrointestinal: Negative for heartburn,  nausea, vomiting, diarrhea, constipation and blood in stool.  Genitourinary: Negative for dysuria, urgency, frequency and hematuria.  Musculoskeletal: Negative for myalgias, back pain, joint pain, falls and neck pain.  Skin: Negative for itching.  Neurological: Negative for dizziness, tingling, tremors, focal weakness, seizures, loss of consciousness, weakness and headaches.  Endo/Heme/Allergies: Does not bruise/bleed easily.  Psychiatric/Behavioral: Negative for depression and substance abuse. The patient is not nervous/anxious and does not have insomnia.      Objective  Filed Vitals:   05/01/15 0852  BP: 142/86  Pulse: 104  Temp: 98 F (36.7 C)  TempSrc: Oral  Resp: 18  Height: 6\' 1"  (1.854 m)  Weight: 280 lb 12.8 oz (127.37 kg)  SpO2: 96%     Physical Exam  Constitutional: He is oriented to person, place, and time and well-developed, well-nourished, and in no distress.  HENT:  Head: Normocephalic.  Eyes: EOM are normal. Pupils are equal, round, and reactive to light.  Neck: Normal range of motion. Neck supple. No thyromegaly present.  Cardiovascular: Normal rate, regular rhythm and normal heart sounds.   No murmur heard. Pulmonary/Chest: Effort normal and breath sounds normal. No respiratory distress. He has no wheezes.  Abdominal: Soft. Bowel sounds are normal.  Musculoskeletal: Normal range of motion. He exhibits no edema.  Lymphadenopathy:    He has no cervical adenopathy.  Neurological: He is alert and oriented to person, place, and time. No cranial nerve deficit. Gait normal. Coordination normal.  Skin: Skin is warm and dry. No rash noted.  Psychiatric: Affect and judgment normal.      Assessment & Plan  1. Abnormal EKG Stress testing with cardiologist as scheduled  2. Precordial pain As above  3. Essential hypertension Not at all not at goal  4. Type 2 diabetes mellitus with hyperosmolarity without coma, without long-term current use of insulin  (HCC) Not at [facility] male erectile dysfunction Multifactorial  6. Sleep apnea  continue CPAP.   Hyperlipidemia Labs on return visit  8. Adult BMI 30+ Encourage diet and exercise   9. Acute anxietysystematic treatment as needed

## 2015-05-01 NOTE — Progress Notes (Signed)
Cardiology Office Note:  Date of Encounter: 05/01/2015  ID: Marvin Sauger Sr., DOB [birth year], MRN [id number]  PCP:  Ashok Norris, MD Primary Cardiologist:  Dr. Fletcher Anon, MD  Chief Complaint  Patient presents with  . other    Follow up from [facility] ER chest pain. Meds reviewed by the patient verbally. Pt. c/o chest pain at times with tingling sensation in finers as well.     HPI:  59 year old male with history of DM2 diagnosed in 2000, HTN, HLD, obseity and sleep apnea previously on CPAP but stopped using it after losing about 30 pounds who presents for ED follow up of chest pain. He was seen in January of 2016 for chest pain with generalized cramps after visiting the ED as well. At that time all of his labs were unremarkable except for mild hyponatremia of 132 and boderline hypokalemia of 3.5. Cardaic enzymes were negative and ECG showed no acute changes. He underwent nuclear stress testing at that showed no evidence of significant ischemia, GI uptake was noted, EF 51%, no ECG changes concerning for ischemia, normal study.   He again presented to the ED around Christmas time this year with chest pain. 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 He was complaining of intermittent sharp chest pain. Cardiac enzymes were negative x 1. Glucose 224, sodium 134, K+ 4.0, SCr 1.20, hgb 12.9. CXR showed no acute cardiopulmonary disease. ECG showed NSR, 82 bpm, nonspecific st/t changes inferolateral leads. He was advised to follow up with PCP and cardiology and discharged from the ED.   He reports the most recent episodes of left and right-sided chest pain are "light" in nature. Are not related to either exertion or rest and are not associated with any other symptoms, except the episode that prompted him to come to the ED he did have some palpitations lasting "unitl I got to the car." The  tingling in his fingers occurs when he sleeps with his hands above his head, otherwise he does not develop tingling in his fingers. The above left and right-sided chest pains are intermittent with pain lasting minutes in duration before self resolving. The pain he is currently having does not feel like the pain he was having the prior year. He feels like the pain he was having the prior year was 2/2 the influenza vaccine. He is currently without pain. 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 He reports this was a job "I wanted my whole life."     Past Medical History  Diagnosis Date  . Pharyngitis   . Hypertension   . Diabetes mellitus without complication ([place])   . Decreased libido   . Hyperlipidemia   . Obesity   . Sleep apnea   . History of nuclear stress test     a. 05/2014: no evidence of significant ischemia, GI uptake was noted, EF 51%, no ECG changes concerning for ischemia, normal study  :  Past Surgical History  Procedure Laterality Date  . Colonoscopy    . Av fistula repair    . Cardiac catheterization      [facility]   :  Social History:  The patient  reports that he quit smoking about 16 years ago. His smoking use included Cigarettes. He has a 6.25 pack-year smoking history. He has never used smokeless tobacco.  He reports that he does not drink alcohol or use illicit drugs.   Family History  Problem Relation Age of Onset  . Breast cancer Mother   . Kidney failure Father   . Diabetes Father      Allergies:  Allergies  Allergen Reactions  . Atorvastatin Other (See Comments)    Statins cause headaches and muscle aches     Home Medications:  Current Outpatient Prescriptions  Medication Sig Dispense Refill  . aspirin 81 MG tablet Take 81 mg by mouth daily.    . Cholecalciferol (VITAMIN D) 2000 UNITS tablet Take 2,000 Units by mouth daily.    Marland Kitchen diltiazem (CARTIA XT) 120 MG 24 hr capsule Take 1 capsule (120 mg total)  by mouth daily. 90 capsule 1  . Flaxseed, Linseed, (FLAXSEED OIL) 1000 MG CAPS Take 1,000 mg by mouth daily.     . fluticasone (FLONASE) 50 MCG/ACT nasal spray Place 2 sprays into both nostrils as needed. 16 g 5  . glipiZIDE (GLUCOTROL) 10 MG tablet TAKE ONE TABLET BY MOUTH TWICE DAILY BEFORE MEAL(S) 180 tablet 1  . indapamide (LOZOL) 2.5 MG tablet Take 1 tablet (2.5 mg total) by mouth daily. 30 tablet 5  . Insulin Pen Needle 32G X 6 MM MISC 1 Package by Does not apply route once. 100 each 3  . Liraglutide (VICTOZA) 18 MG/3ML SOPN Inject 0.17 mLs (1.02 mg total) into the skin daily. (Patient taking differently: Inject 1.2 mg into the skin daily. ) 6 mL 11  . LORazepam (ATIVAN) 0.5 MG tablet Take 1 tablet (0.5 mg total) by mouth 2 (two) times daily as needed for anxiety. 30 tablet 1  . losartan (COZAAR) 100 MG tablet Take 1 tablet (100 mg total) by mouth daily. 90 tablet 0  . metFORMIN (GLUCOPHAGE) 1000 MG tablet Take 1 tablet (1,000 mg total) by mouth 2 (two) times daily with a meal. 180 tablet 1  . Omega-3 Fatty Acids (FISH OIL) 1000 MG CAPS Take 1,000 mg by mouth daily.     Marland Kitchen omeprazole (PRILOSEC) 20 MG capsule Take 1 capsule (20 mg total) by mouth every morning. 30 capsule 11  . pravastatin (PRAVACHOL) 40 MG tablet Take 1 tablet (40 mg total) by mouth daily. 90 tablet 0  . tadalafil (CIALIS) 20 MG tablet Take 0.5-1 tablets (10-20 mg total) by mouth daily as needed for erectile dysfunction. 5 tablet 11  . triamterene-hydrochlorothiazide (MAXZIDE-25) 37.5-25 MG per tablet Take 1 tablet by mouth daily. 30 tablet 3  . zolpidem (AMBIEN) 10 MG tablet TAKE ONE TABLET BY MOUTH AT BEDTIME 30 tablet 0   No current facility-administered medications for this visit.     Review of Systems:  Review of Systems  Constitutional: Negative for fever, chills, weight loss, malaise/fatigue and diaphoresis.  HENT: Negative for congestion.   Eyes: Negative for discharge and redness.  Respiratory: Negative for  cough, hemoptysis, sputum production, shortness of breath and wheezing.   Cardiovascular: Positive for chest pain. Negative for palpitations, orthopnea, claudication, leg swelling and PND.       "Light" left and right-sided chest pain lasting minutes at a time. Not associated with exertion or rest  Gastrointestinal: Negative for nausea, vomiting and abdominal pain.  Musculoskeletal: Negative for falls.  Skin: Negative for rash.  Neurological: Positive for tingling and sensory change. Negative for dizziness, tremors, speech change, focal weakness, loss of consciousness and weakness.       Tingling and sensory change in his fingers if he sleeps with his  arms above his head  Endo/Heme/Allergies: Does not bruise/bleed easily.  Psychiatric/Behavioral: Negative for substance abuse. The patient is nervous/anxious.   All other systems reviewed and are negative.    Physical Exam:  Blood pressure 142/60, pulse 67, height 6' 1.5" (1.867 m), weight 280 lb (127.007 kg). BMI: Body mass index is 36.44 kg/(m^2). General: Pleasant, NAD. Psych: Normal affect. Responds to questions with normal affect.  Neuro: Alert and oriented X 3. Moves all extremities spontaneously. HEENT: Normocephalic, atraumatic. EOM intact. Sclera anicteric.  Neck: Trachea midline. Supple without bruits or JVD. Lungs:  Respirations regular and unlabored. CTA bilaterally without wheezing, crackles, or rhonchi.  Heart: RRR, normal s3, s4. No murmurs, rubs, or gallops. Palpation does not reproduce his symptoms.   Abdomen: Obese, soft, non-tender, non-distended, BS + x 4.  Extremities: No clubbing, cyanosis or edema. DP/PT/Radials 2+ and equal bilaterally.   Accessory Clinical Findings:  EKG: NSR, 89 bpm, TWI I, aVL, nonspecific st/t changes II, III, aVF, V5-V6  Recent Labs: 03/06/2015: ALT 9; TSH 1.210 04/29/2015: BUN 20; Creatinine, Ser 1.20; Hemoglobin 12.9*; Platelets 241; Potassium 4.0; Sodium 134*  03/06/2015: Chol/HDL Ratio  3.8; Cholesterol, Total 146; HDL 38*; LDL Calculated 89; Triglycerides 93  Estimated Creatinine Clearance: 94.4 mL/min (by C-G formula based on Cr of 1.2).  Weights: Wt Readings from Last 3 Encounters:  05/01/15 280 lb (127.007 kg)  05/01/15 280 lb 12.8 oz (127.37 kg)  04/29/15 274 lb (124.286 kg)    Other studies Reviewed: Additional studies/ records that were reviewed today include: Cardiology, PCP, and ED notes.  Assessment & Plan:  1. Chest pain with moderate risk of cardiac etiology: -Recent treadmill Myoview January 2015 that was negative for ischemia -Risk factors include IDDM, HTN, HLD, obesity, former tobacco abuse of 6.25 pack years -Discussed further evaluation options to include repeat nuclear stress testing vs cardiac catheterization  -Patient prefers repeat treadmill Myoview at this time, though wait 3 weeks as he is just starting his new job on 05/05/2015 -He will need to continue to control his above risk factors -Continue aspirin and pravastatin   2. HTN: -BP has typically been running in the 120s/80s -Continue current medications  3. IDDM: -Per PCP -A1C 7.3% on 04/08/2015  4. Obesity/OSA: -CPAP -Weight loss advised    Dispo: -Follow up post stress test  Current medicines are reviewed at length with the patient today.  The patient did not have any concerns regarding medicines.   Christell Faith, PA-C [place],  [phone] [facility] 05/01/2015, 3:32 PM

## 2015-05-01 NOTE — Telephone Encounter (Signed)
Patient was seen today.

## 2015-05-06 ENCOUNTER — Encounter: Payer: Self-pay | Admitting: *Deleted

## 2015-05-26 ENCOUNTER — Other Ambulatory Visit: Payer: 59

## 2015-05-26 ENCOUNTER — Telehealth: Payer: Self-pay

## 2015-05-26 DIAGNOSIS — R079 Chest pain, unspecified: Secondary | ICD-10-CM | POA: Diagnosis not present

## 2015-05-26 NOTE — Telephone Encounter (Signed)
Reviewed lexi instructions w/pt who verbalized understanding.

## 2015-05-27 ENCOUNTER — Encounter
Admission: RE | Admit: 2015-05-27 | Discharge: 2015-05-27 | Disposition: A | Discharge status: Home / Self Care | Payer: 59 | Source: Ambulatory Visit | Attending: Physician Assistant | Admitting: Physician Assistant

## 2015-05-27 ENCOUNTER — Other Ambulatory Visit: Payer: Self-pay | Admitting: Family Medicine

## 2015-05-27 DIAGNOSIS — R079 Chest pain, unspecified: Secondary | ICD-10-CM | POA: Diagnosis not present

## 2015-05-27 LAB — NM MYOCAR MULTI W/SPECT W/WALL MOTION / EF
CHL CUP RESTING HR STRESS: 67 {beats}/min
CSEPED: 8 min
CSEPEW: 9.9 METS
CSEPPHR: 150 {beats}/min
LVDIAVOL: 107 mL
LVSYSVOL: 53 mL
NUC STRESS TID: 0.89
Percent HR: 92 %
SDS: 0
SRS: 1
SSS: 0

## 2015-05-27 MED ORDER — TECHNETIUM TC 99M SESTAMIBI - CARDIOLITE
31.4930 | Freq: Once | INTRAVENOUS | Status: AC | PRN
  Administered 2015-05-27: 31.493 via INTRAVENOUS

## 2015-05-27 MED ORDER — TECHNETIUM TC 99M SESTAMIBI - CARDIOLITE
10.0000 | Freq: Once | INTRAVENOUS | Status: AC | PRN
  Administered 2015-05-27: 12.63 via INTRAVENOUS

## 2015-05-28 ENCOUNTER — Other Ambulatory Visit: Payer: Self-pay

## 2015-05-28 DIAGNOSIS — R079 Chest pain, unspecified: Secondary | ICD-10-CM

## 2015-05-28 DIAGNOSIS — R9439 Abnormal result of other cardiovascular function study: Secondary | ICD-10-CM

## 2015-06-03 ENCOUNTER — Other Ambulatory Visit: Payer: Self-pay | Admitting: Family Medicine

## 2015-06-03 ENCOUNTER — Other Ambulatory Visit: Payer: Self-pay

## 2015-06-03 ENCOUNTER — Ambulatory Visit (INDEPENDENT_AMBULATORY_CARE_PROVIDER_SITE_OTHER): Payer: 59

## 2015-06-03 DIAGNOSIS — R9439 Abnormal result of other cardiovascular function study: Secondary | ICD-10-CM

## 2015-06-03 DIAGNOSIS — R079 Chest pain, unspecified: Secondary | ICD-10-CM

## 2015-06-12 ENCOUNTER — Telehealth: Payer: Self-pay

## 2015-06-12 ENCOUNTER — Encounter: Payer: Self-pay | Admitting: Family Medicine

## 2015-06-12 NOTE — Telephone Encounter (Signed)
S/w pt who called to inquire of echo results from Jan 17.  He states if echo is normal, he would like to cancel his Jan 31 f/u visit as he has already taken time off from work for the tests.  Echo results are not in MD basket therefore, I have submitted it to Dr. Fletcher Anon for review. Advised pt I will CB after results available. Pt agreeable w/plan.

## 2015-06-12 NOTE — Telephone Encounter (Signed)
Pt is requesting his myoview results over the phone, he does not want to come on 1/31 for his f/u appt

## 2015-06-12 NOTE — Telephone Encounter (Signed)
Marvin Sauger Sr. >','<< Less Detail',event)" href="javascript:;">More Detail >>   Marvin Hampshire, MD   SentMegan Mans 3:44 PM    To: Georgiana Shore, RN        Message         Echo was fine. Ejection fraction is normal.        ----- Message -----     From: Georgiana Shore, RN     Sent: 06/12/2015  2:37 PM      To: Marvin Hampshire, MD        Informed pt of echo results. Pt verbalized understanding and states he would like to cancel Jan 31 f/u as his tests were normal.  Advised pt to call if he has any issues. Appt cancelled at patient request.

## 2015-06-17 ENCOUNTER — Ambulatory Visit: Payer: 59 | Admitting: Nurse Practitioner

## 2015-06-26 ENCOUNTER — Other Ambulatory Visit: Payer: Self-pay | Admitting: Family Medicine

## 2015-06-30 ENCOUNTER — Telehealth: Payer: Self-pay | Admitting: Family Medicine

## 2015-06-30 NOTE — Telephone Encounter (Signed)
Requesting refill on Zolpidem. Please send to [facility]

## 2015-07-01 MED ORDER — ZOLPIDEM TARTRATE 10 MG PO TABS: 10.0000 mg | ORAL_TABLET | Freq: Every day | ORAL | Status: DC

## 2015-07-01 NOTE — Telephone Encounter (Signed)
Script faxed to pharmacy

## 2015-07-01 NOTE — Telephone Encounter (Signed)
Patient informed. 

## 2015-07-08 ENCOUNTER — Ambulatory Visit: Payer: 59 | Admitting: Family Medicine

## 2015-07-30 ENCOUNTER — Other Ambulatory Visit: Payer: Self-pay | Admitting: Family Medicine

## 2015-08-06 ENCOUNTER — Ambulatory Visit: Payer: 59 | Admitting: Family Medicine

## 2015-08-20 ENCOUNTER — Other Ambulatory Visit: Payer: Self-pay | Admitting: Family Medicine

## 2015-08-25 ENCOUNTER — Other Ambulatory Visit: Payer: Self-pay

## 2015-08-25 MED ORDER — ZOLPIDEM TARTRATE 10 MG PO TABS: 10.0000 mg | ORAL_TABLET | Freq: Every day | ORAL | Status: DC

## 2015-09-02 ENCOUNTER — Other Ambulatory Visit: Payer: Self-pay

## 2015-09-02 MED ORDER — ZOLPIDEM TARTRATE 10 MG PO TABS
10.0000 mg | ORAL_TABLET | Freq: Every day | ORAL | Status: DC
Start: 2015-09-02 — End: 2015-09-26

## 2015-09-09 ENCOUNTER — Other Ambulatory Visit: Payer: Self-pay | Admitting: Family Medicine

## 2015-09-16 ENCOUNTER — Ambulatory Visit: Payer: 59 | Admitting: Family Medicine

## 2015-09-22 ENCOUNTER — Ambulatory Visit: Payer: 59 | Admitting: Family Medicine

## 2015-09-26 ENCOUNTER — Encounter: Payer: Self-pay | Admitting: Family Medicine

## 2015-09-26 ENCOUNTER — Ambulatory Visit (INDEPENDENT_AMBULATORY_CARE_PROVIDER_SITE_OTHER): Payer: 59 | Admitting: Family Medicine

## 2015-09-26 VITALS — BP 158/100 | HR 80 | Temp 98.3°F | Resp 16 | Ht 73.0 in | Wt 285.0 lb

## 2015-09-26 DIAGNOSIS — Z1211 Encounter for screening for malignant neoplasm of colon: Secondary | ICD-10-CM | POA: Insufficient documentation

## 2015-09-26 DIAGNOSIS — IMO0002 Reserved for concepts with insufficient information to code with codable children: Secondary | ICD-10-CM

## 2015-09-26 DIAGNOSIS — E1100 Type 2 diabetes mellitus with hyperosmolarity without nonketotic hyperglycemic-hyperosmolar coma (NKHHC): Secondary | ICD-10-CM | POA: Diagnosis not present

## 2015-09-26 DIAGNOSIS — G4730 Sleep apnea, unspecified: Secondary | ICD-10-CM

## 2015-09-26 DIAGNOSIS — Z1159 Encounter for screening for other viral diseases: Secondary | ICD-10-CM | POA: Diagnosis not present

## 2015-09-26 DIAGNOSIS — N529 Male erectile dysfunction, unspecified: Secondary | ICD-10-CM

## 2015-09-26 DIAGNOSIS — E668 Other obesity: Secondary | ICD-10-CM

## 2015-09-26 DIAGNOSIS — Z114 Encounter for screening for human immunodeficiency virus [HIV]: Secondary | ICD-10-CM | POA: Insufficient documentation

## 2015-09-26 DIAGNOSIS — Z5181 Encounter for therapeutic drug level monitoring: Secondary | ICD-10-CM | POA: Insufficient documentation

## 2015-09-26 DIAGNOSIS — E785 Hyperlipidemia, unspecified: Secondary | ICD-10-CM

## 2015-09-26 DIAGNOSIS — I10 Essential (primary) hypertension: Secondary | ICD-10-CM

## 2015-09-26 DIAGNOSIS — Z125 Encounter for screening for malignant neoplasm of prostate: Secondary | ICD-10-CM | POA: Insufficient documentation

## 2015-09-26 MED ORDER — TRAZODONE HCL 50 MG PO TABS: 50.0000 mg | ORAL_TABLET | Freq: Every evening | ORAL | Status: DC | PRN

## 2015-09-26 MED ORDER — SILDENAFIL CITRATE 20 MG PO TABS: ORAL_TABLET | ORAL | Status: DC

## 2015-09-26 MED ORDER — AMLODIPINE BESYLATE 5 MG PO TABS: 5.0000 mg | ORAL_TABLET | Freq: Every day | ORAL | Status: DC

## 2015-09-26 NOTE — Assessment & Plan Note (Addendum)
Add amlo 5 mg; check at home, call if needed, if not to [facility]; patient to monitor BP at home, discussed "prop and flop" method with wrist BP cuff; will increase amlodipine in 2 weeks if he is not controlled; DASH guidelines encouraged; weight loss stressed

## 2015-09-26 NOTE — Patient Instructions (Addendum)
Check out the information at familydoctor.org entitled "Nutrition for Weight Loss: What You Need to Know About Fad Diets" Try to lose between 1-2 pounds per week by taking in fewer calories and burning off more calories You can succeed by limiting portions, limiting foods dense in calories and fat, becoming more active, and drinking 8 glasses of water a day (64 ounces) Don't skip meals, especially breakfast, as skipping meals may alter your metabolism Do not use over-the-counter weight loss pills or gimmicks that claim rapid weight loss A healthy BMI (or body mass index) is between 18.5 and 24.9 You can calculate your ideal BMI at the [place] website ClubMonetize.fr   Your goal blood pressure is less than 130 mmHg on top. Try to follow the DASH guidelines (DASH stands for Dietary Approaches to Stop Hypertension) Try to limit the sodium in your diet.  Ideally, consume less than 1.5 grams (less than 1,500mg ) per day. Do not add salt when cooking or at the table.  Check the sodium amount on labels when shopping, and choose items lower in sodium when given a choice. Avoid or limit foods that already contain a lot of sodium. Eat a diet rich in fruits and vegetables and whole grains. Monitor your blood pressure (prop and flop), and call me to increase your amlodipine if not to goal after 2 weeks  Use the new medicine instead of Cialis  Use the new medicine instead of zolpidem  Let me know if you'd like referral for physical therapy  DASH Eating Plan DASH stands for "Dietary Approaches to Stop Hypertension." The DASH eating plan is a healthy eating plan that has been shown to reduce high blood pressure (hypertension). Additional health benefits may include reducing the risk of type 2 diabetes mellitus, heart disease, and stroke. The DASH eating plan may also help with weight loss. WHAT DO I NEED TO KNOW ABOUT THE DASH EATING PLAN? For the DASH  eating plan, you will follow these general guidelines:  Choose foods with a percent daily value for sodium of less than 5% (as listed on the food label).  Use salt-free seasonings or herbs instead of table salt or sea salt.  Check with your health care provider or pharmacist before using salt substitutes.  Eat lower-sodium products, often labeled as "lower sodium" or "no salt added."  Eat fresh foods.  Eat more vegetables, fruits, and low-fat dairy products.  Choose whole grains. Look for the word "whole" as the first word in the ingredient list.  Choose fish and skinless chicken or [place] more often than red meat. Limit fish, poultry, and meat to 6 oz (170 g) each day.  Limit sweets, desserts, sugars, and sugary drinks.  Choose heart-healthy fats.  Limit cheese to 1 oz (28 g) per day.  Eat more home-cooked food and less restaurant, buffet, and fast food.  Limit fried foods.  Cook foods using methods other than frying.  Limit canned vegetables. If you do use them, rinse them well to decrease the sodium.  When eating at a restaurant, ask that your food be prepared with less salt, or no salt if possible. WHAT FOODS CAN I EAT? Seek help from a dietitian for individual calorie needs. Grains Whole grain or whole wheat bread. Dec rice. Whole grain or whole wheat pasta. Quinoa, bulgur, and whole grain cereals. Low-sodium cereals. Corn or whole wheat flour tortillas. Whole grain cornbread. Whole grain crackers. Low-sodium crackers. Vegetables Fresh or frozen vegetables (raw, steamed, roasted, or grilled). Low-sodium or reduced-sodium  tomato and vegetable juices. Low-sodium or reduced-sodium tomato sauce and paste. Low-sodium or reduced-sodium canned vegetables.  Fruits All fresh, canned (in natural juice), or frozen fruits. Meat and Other Protein Products Ground beef (85% or leaner), grass-fed beef, or beef trimmed of fat. Skinless chicken or [place]. Ground chicken or [place]. Pork  trimmed of fat. All fish and seafood. Eggs. Dried beans, peas, or lentils. Unsalted nuts and seeds. Unsalted canned beans. Dairy Low-fat dairy products, such as skim or 1% milk, 2% or reduced-fat cheeses, low-fat ricotta or cottage cheese, or plain low-fat yogurt. Low-sodium or reduced-sodium cheeses. Fats and Oils Tub margarines without trans fats. Light or reduced-fat mayonnaise and salad dressings (reduced sodium). Avocado. Safflower, olive, or canola oils. Natural peanut or almond butter. Other Unsalted popcorn and pretzels. The items listed above may not be a complete list of recommended foods or beverages. Contact your dietitian for more options. WHAT FOODS ARE NOT RECOMMENDED? Grains White bread. White pasta. White rice. Refined cornbread. Bagels and croissants. Crackers that contain trans fat. Vegetables Creamed or fried vegetables. Vegetables in a cheese sauce. Regular canned vegetables. Regular canned tomato sauce and paste. Regular tomato and vegetable juices. Fruits Dried fruits. Canned fruit in light or heavy syrup. Fruit juice. Meat and Other Protein Products Fatty cuts of meat. Ribs, chicken wings, bacon, sausage, bologna, salami, chitterlings, fatback, hot dogs, bratwurst, and packaged luncheon meats. Salted nuts and seeds. Canned beans with salt. Dairy Whole or 2% milk, cream, half-and-half, and cream cheese. Whole-fat or sweetened yogurt. Full-fat cheeses or blue cheese. Nondairy creamers and whipped toppings. Processed cheese, cheese spreads, or cheese curds. Condiments Onion and garlic salt, seasoned salt, table salt, and sea salt. Canned and packaged gravies. Worcestershire sauce. Tartar sauce. Barbecue sauce. Teriyaki sauce. Soy sauce, including reduced sodium. Steak sauce. Fish sauce. Oyster sauce. Cocktail sauce. Horseradish. Ketchup and mustard. Meat flavorings and tenderizers. Bouillon cubes. Hot sauce. Tabasco sauce. Marinades. Taco seasonings. Relishes. Fats and  Oils Butter, stick margarine, lard, shortening, ghee, and bacon fat. Coconut, palm kernel, or palm oils. Regular salad dressings. Other Pickles and olives. Salted popcorn and pretzels. The items listed above may not be a complete list of foods and beverages to avoid. Contact your dietitian for more information. WHERE CAN I FIND MORE INFORMATION? National Heart, Lung, and Blood Institute: travelstabloid.com   This information is not intended to replace advice given to you by your health care provider. Make sure you discuss any questions you have with your health care provider.   Document Released: 04/22/2011 Document Revised: 05/24/2014 Document Reviewed: 03/07/2013 Elsevier Interactive Patient Education [employer].

## 2015-09-26 NOTE — Progress Notes (Signed)
BP 158/100 mmHg  Pulse 80  Temp(Src) 98.3 F (36.8 C) (Oral)  Resp 16  Ht 6\' 1"  (1.854 m)  Wt 285 lb (129.275 kg)  BMI 37.61 kg/m2  SpO2 96%   Subjective:    Patient ID: Marvin Bellis., male    DOB: [birth year], 60 y.o.   MRN: [id number]  HPI: Marvin Duncan. is a 60 y.o. male  Chief Complaint  Patient presents with  . Follow-up    6 months  . Medication Refill  . Hypertension    quit several blood pressure medicines due to causing pain   Back pain left side; no old injuries; no B/B dysfunction Having some electricity, short pains going down, little numbers in the ball of the foot, bit toe Off of juices He stopped the medicine one day and noticed that the back pain went away Not as active lately, job issues No chest pain with activity  He has done the lifestyle diabetes training On metformin, Victoza 1.2 mg daily; no side effects Checking FSBS; mornings 150, not working; when working it's lower, 110-130 No lower sugars, none under 85 Diabetic neuropathy; more when drinking sweets, but gave that up and disappeared Just saw eye doctor in Feb and all was well  Goal weight is 250 pounds; baked foods and veggies; he knows he needs to lose weight  Uses ambien for sleep; been on it for 4-5 years; gets 4 hours of sleep a night without it  OSA; wearing CPAP  Went through a physical for job; saw something for heart, on medicines now he says (?), through Dr. Tyrell Antonio office  Depression screen [facility] 2/9 09/26/2015 05/01/2015 04/08/2015 04/02/2015  Decreased Interest 0 0 0 0  Down, Depressed, Hopeless 0 0 0 0  PHQ - 2 Score 0 0 0 0   Relevant past medical, surgical, family and social history reviewed Past Medical History  Diagnosis Date  . Pharyngitis   . Hypertension   . IDDM (insulin dependent diabetes mellitus) ([place])   . Decreased libido   . Hyperlipidemia   . Obesity   . Sleep apnea   . History of nuclear stress test     a. 05/2014: no evidence of significant  ischemia, GI uptake was noted, EF 51%, no ECG changes concerning for ischemia, normal study  . HTN (hypertension)    Past Surgical History  Procedure Laterality Date  . Colonoscopy    . Av fistula repair    . Cardiac catheterization      [facility]    Family History  Problem Relation Age of Onset  . Breast cancer Mother   . Kidney failure Father   . Diabetes Father   HTN; very positive hx in the family  Social History  Substance Use Topics  . Smoking status: Former Smoker -- 0.25 packs/day for 25 years    Types: Cigarettes    Quit date: 05/17/1998  . Smokeless tobacco: Never Used     Comment: off and on - maybe 5 cigarettes  . Alcohol Use: No     Comment: previously but not currently   Interim medical history since last visit reviewed. Allergies and medications reviewed  Review of Systems Per HPI unless specifically indicated above     Objective:    BP 158/100 mmHg  Pulse 80  Temp(Src) 98.3 F (36.8 C) (Oral)  Resp 16  Ht 6\' 1"  (1.854 m)  Wt 285 lb (129.275 kg)  BMI 37.61 kg/m2  SpO2 96%  Wt  Readings from Last 3 Encounters:  09/26/15 285 lb (129.275 kg)  05/01/15 280 lb (127.007 kg)  05/01/15 280 lb 12.8 oz (127.37 kg)    Physical Exam  Constitutional: He appears well-developed and well-nourished. No distress.  HENT:  Head: Normocephalic and atraumatic.  Eyes: EOM are normal. No scleral icterus.  Neck: No thyromegaly present.  Cardiovascular: Normal rate and regular rhythm.   Pulmonary/Chest: Effort normal and breath sounds normal.  Abdominal: Soft. Bowel sounds are normal. He exhibits no distension.  Musculoskeletal: He exhibits no edema.  Neurological: Coordination normal.  Skin: Skin is warm and dry. No pallor.  Psychiatric: He has a normal mood and affect. His behavior is normal. Judgment and thought content normal.   Diabetic Foot Form - Detailed   Diabetic Foot Exam - detailed  Diabetic Foot exam was performed with the following findings:  Yes     Visual Foot Exam completed.:  Yes  Are the toenails long?:  No  Are the toenails thick?:  No  Are the toenails ingrown?:  No    Pulse Foot Exam completed.:  Yes  Right Dorsalis Pedis:  Present Left Dorsalis Pedis:  Present  Sensory Foot Exam Completed.:  Yes  Semmes-Weinstein Monofilament Test  R Site 1-Great Toe:  Pos L Site 1-Great Toe:  Pos  R Site 4:  Pos L Site 4:  Pos  R Site 5:  Pos L Site 5:  Pos          Assessment & Plan:   Problem List Items Addressed This Visit      Cardiovascular and Mediastinum   Essential hypertension    Add amlo 5 mg; check at home, call if needed, if not to goa; patient to monitor BP at home, discussed "prop and flop" method with wrist BP cuff; will increase amlodipine in 2 weeks if he is not controlled; DASH guidelines encouraged; weight loss stressed      Relevant Medications   amLODipine (NORVASC) 5 MG tablet     Endocrine   Diabetes mellitus, type 2 (HCC) - Primary    Check A1c and urine microalb today; continue meds; work on weight loss; foot exam by MD      Relevant Orders   Hemoglobin A1c (Completed)   Microalbumin / creatinine urine ratio (Completed)     Genitourinary   ED (erectile dysfunction)     Other   Adult BMI 30+    Stressed importance of weight loss; see AVS for recommendations      Hyperlipidemia    Check lipids and sgpt; limit saturated fats; work on weight loss      Relevant Medications   amLODipine (NORVASC) 5 MG tablet   Other Relevant Orders   Lipid Panel w/o Chol/HDL Ratio (Completed)   Medication monitoring encounter   Relevant Orders   Comprehensive metabolic panel (Completed)   Need for hepatitis C screening test    Discussed one-time hep C screening recommendation for individuals born between 1945-1965 per USPSTF guidelines; patient agrees with testing; Hep C Ab ordered      Relevant Orders   Hepatitis C antibody (Completed)   Prostate cancer screening   Relevant Orders   PSA (Completed)    Screening for HIV (human immunodeficiency virus)    Discussed one-time HIV screening recommendation per USPSTF guidelines; patient agrees with testing; HIV antibody ordered      Relevant Orders   HIV antibody (Completed)   Sleep apnea    Cpap, weight loss; I do not personally  recommend that he take Lorrin Mais if he has sleep apnea; will try trazodone instead         Follow up plan: Return in about 6 months (around 03/28/2016) for next regular f/u; 3 months if A1c is 7+.  An after-visit summary was printed and given to the patient at [facility].  Please see the patient instructions which may contain other information and recommendations beyond what is mentioned above in the assessment and plan.  Meds ordered this encounter  Medications  . traZODone (DESYREL) 50 MG tablet    Sig: Take 1-2 tablets (50-100 mg total) by mouth at bedtime as needed for sleep.    Dispense:  60 tablet    Refill:  3  . amLODipine (NORVASC) 5 MG tablet    Sig: Take 1 tablet (5 mg total) by mouth daily.    Dispense:  30 tablet    Refill:  3  . DISCONTD: sildenafil (REVATIO) 20 MG tablet    Sig: Three to five pills by mouth prior to intimacy once a day; five pills equals one pill of cialis    Dispense:  50 tablet    Refill:  11   Orders Placed This Encounter  Procedures  . Hepatitis C antibody  . HIV antibody  . Comprehensive metabolic panel  . Hemoglobin A1c  . Lipid Panel w/o Chol/HDL Ratio  . PSA  . Microalbumin / creatinine urine ratio

## 2015-09-26 NOTE — Assessment & Plan Note (Addendum)
Cpap, weight loss; I do not personally recommend that he take Lorrin Mais if he has sleep apnea; will try trazodone instead

## 2015-09-26 NOTE — Assessment & Plan Note (Addendum)
Check lipids and sgpt; limit saturated fats; work on weight loss

## 2015-09-26 NOTE — Assessment & Plan Note (Addendum)
Check A1c and urine microalb today; continue meds; work on weight loss; foot exam by MD

## 2015-09-27 LAB — COMPREHENSIVE METABOLIC PANEL
A/G RATIO: 1.5 (ref 1.2–2.2)
ALBUMIN: 4.5 g/dL (ref 3.5–5.5)
ALK PHOS: 75 IU/L (ref 39–117)
ALT: 9 IU/L (ref 0–44)
AST: 14 IU/L (ref 0–40)
BILIRUBIN TOTAL: 0.6 mg/dL (ref 0.0–1.2)
BUN / CREAT RATIO: 12 (ref 9–20)
BUN: 13 mg/dL (ref 6–24)
CALCIUM: 9.9 mg/dL (ref 8.7–10.2)
CO2: 23 mmol/L (ref 18–29)
CREATININE: 1.12 mg/dL (ref 0.76–1.27)
Chloride: 98 mmol/L (ref 96–106)
GFR calc Af Amer: 83 mL/min/{1.73_m2} (ref >59)
GFR calc non Af Amer: 72 mL/min/{1.73_m2} (ref >59)
GLOBULIN, TOTAL: 3 g/dL (ref 1.5–4.5)
GLUCOSE: 158 mg/dL — AB (ref 65–99)
POTASSIUM: 4.3 mmol/L (ref 3.5–5.2)
SODIUM: 136 mmol/L (ref 134–144)
Total Protein: 7.5 g/dL (ref 6.0–8.5)

## 2015-09-27 LAB — HEPATITIS C ANTIBODY: Hep C Virus Ab: <0.1 s/co ratio (ref 0.0–0.9)

## 2015-09-27 LAB — LIPID PANEL W/O CHOL/HDL RATIO
Cholesterol, Total: 164 mg/dL (ref 100–199)
HDL: 44 mg/dL (ref >39)
LDL Calculated: 104 mg/dL — ABNORMAL HIGH (ref 0–99)
Triglycerides: 81 mg/dL (ref 0–149)
VLDL CHOLESTEROL CAL: 16 mg/dL (ref 5–40)

## 2015-09-27 LAB — MICROALBUMIN / CREATININE URINE RATIO
CREATININE, UR: 131.7 mg/dL
MICROALB/CREAT RATIO: 67.4 mg/g creat — ABNORMAL HIGH (ref 0.0–30.0)
MICROALBUM., U, RANDOM: 88.8 ug/mL

## 2015-09-27 LAB — PSA: PROSTATE SPECIFIC AG, SERUM: 0.5 ng/mL (ref 0.0–4.0)

## 2015-09-27 LAB — HEMOGLOBIN A1C
Est. average glucose Bld gHb Est-mCnc: 197 mg/dL
HEMOGLOBIN A1C: 8.5 % — AB (ref 4.8–5.6)

## 2015-09-27 LAB — HIV ANTIBODY (ROUTINE TESTING W REFLEX): HIV Screen 4th Generation wRfx: NONREACTIVE

## 2015-10-04 ENCOUNTER — Telehealth: Payer: Self-pay | Admitting: Family Medicine

## 2015-10-04 NOTE — Telephone Encounter (Signed)
A1c has gone up Positive urine microalbumin:Cr

## 2015-10-06 MED ORDER — SILDENAFIL CITRATE 20 MG PO TABS: ORAL_TABLET | ORAL | Status: DC

## 2015-10-06 NOTE — Telephone Encounter (Signed)
I spoke with patient; explained labs; he thinks the rise in A1c and LDL is explained by his being sedentary for a few months; that has all changed, he is on the job site now in [facility], as a matter of fact; we'll stay the course with his meds Explained small amount of protein leaking; likely related to A1c worsening; we'll recheck at f/u in 3 months Pharmacy did not get sildenafil; I'll resend, 2-5 pills (40 mg to 100 mg) PRN

## 2015-10-12 NOTE — Assessment & Plan Note (Signed)
Discussed one-time hep C screening recommendation for individuals born between 1945-1965 per USPSTF guidelines; patient agrees with testing; Hep C Ab ordered 

## 2015-10-12 NOTE — Assessment & Plan Note (Signed)
Stressed importance of weight loss; see AVS for recommendations

## 2015-10-12 NOTE — Assessment & Plan Note (Signed)
Discussed one-time HIV screening recommendation per USPSTF guidelines; patient agrees with testing; HIV antibody ordered 

## 2015-10-29 ENCOUNTER — Other Ambulatory Visit: Payer: Self-pay | Admitting: Family Medicine

## 2015-12-08 ENCOUNTER — Other Ambulatory Visit: Payer: Self-pay | Admitting: Family Medicine

## 2015-12-10 ENCOUNTER — Other Ambulatory Visit: Payer: Self-pay

## 2015-12-11 MED ORDER — LOSARTAN POTASSIUM 100 MG PO TABS: 100.0000 mg | ORAL_TABLET | Freq: Every day | ORAL | 1 refills | Status: DC

## 2015-12-11 MED ORDER — PRAVASTATIN SODIUM 40 MG PO TABS: 40.0000 mg | ORAL_TABLET | Freq: Every day | ORAL | 1 refills | Status: DC

## 2015-12-11 NOTE — Telephone Encounter (Signed)
Reviewed last sgpt and lipids and cr; rxs approved

## 2015-12-15 ENCOUNTER — Other Ambulatory Visit: Payer: Self-pay

## 2015-12-15 ENCOUNTER — Telehealth: Payer: Self-pay | Admitting: Family Medicine

## 2015-12-15 NOTE — Telephone Encounter (Signed)
Patient is requesting a refill on the following medications to be sent to [facility] for a 90 day supply.  Patient stated that his insurance is charging more when he picks up medication from [place] instead of using the mail order:  AmLODipine 5 mg GlipiZide 10mg 

## 2015-12-15 NOTE — Telephone Encounter (Signed)
Needs 90 day supply 

## 2015-12-16 MED ORDER — METFORMIN HCL 1000 MG PO TABS: 1000.0000 mg | ORAL_TABLET | Freq: Two times a day (BID) | ORAL | 0 refills | Status: DC

## 2015-12-16 MED ORDER — GLIPIZIDE 10 MG PO TABS: 10.0000 mg | ORAL_TABLET | Freq: Two times a day (BID) | ORAL | 0 refills | Status: DC

## 2015-12-16 NOTE — Telephone Encounter (Signed)
Please advise, you are completely booked until the end of August and 12/26/15 is your early day to leave

## 2015-12-16 NOTE — Telephone Encounter (Signed)
The appt August 11th needs to be with me, not the CMA; thank you

## 2015-12-17 MED ORDER — AMLODIPINE BESYLATE 5 MG PO TABS: 5.0000 mg | ORAL_TABLET | Freq: Every day | ORAL | 3 refills | Status: DC

## 2015-12-17 NOTE — Telephone Encounter (Signed)
Left voice message for patient to return call. °

## 2015-12-17 NOTE — Telephone Encounter (Signed)
Patient informed to come in at 1:20 instead of 8am. Also informed prescriptions have been sent to pharmacy.

## 2015-12-17 NOTE — Telephone Encounter (Signed)
I sent the sulfonylurea yesterday; sending CCB

## 2015-12-17 NOTE — Telephone Encounter (Signed)
I'll stay and see him at 1:20 pm then; thanks!

## 2015-12-26 ENCOUNTER — Encounter: Payer: Self-pay | Admitting: Family Medicine

## 2015-12-26 ENCOUNTER — Ambulatory Visit (INDEPENDENT_AMBULATORY_CARE_PROVIDER_SITE_OTHER): Payer: 59 | Admitting: Family Medicine

## 2015-12-26 DIAGNOSIS — E1100 Type 2 diabetes mellitus with hyperosmolarity without nonketotic hyperglycemic-hyperosmolar coma (NKHHC): Secondary | ICD-10-CM

## 2015-12-26 DIAGNOSIS — I10 Essential (primary) hypertension: Secondary | ICD-10-CM | POA: Diagnosis not present

## 2015-12-26 DIAGNOSIS — E668 Other obesity: Secondary | ICD-10-CM

## 2015-12-26 DIAGNOSIS — K219 Gastro-esophageal reflux disease without esophagitis: Secondary | ICD-10-CM | POA: Diagnosis not present

## 2015-12-26 DIAGNOSIS — IMO0002 Reserved for concepts with insufficient information to code with codable children: Secondary | ICD-10-CM

## 2015-12-26 DIAGNOSIS — R252 Cramp and spasm: Secondary | ICD-10-CM

## 2015-12-26 LAB — BASIC METABOLIC PANEL WITH GFR
BUN: 15 mg/dL (ref 7–25)
CHLORIDE: 102 mmol/L (ref 98–110)
CO2: 21 mmol/L (ref 20–31)
CREATININE: 1.12 mg/dL (ref 0.70–1.33)
Calcium: 9.4 mg/dL (ref 8.6–10.3)
GFR, EST AFRICAN AMERICAN: 83 mL/min (ref >=60)
GFR, Est Non African American: 72 mL/min (ref >=60)
Glucose, Bld: 313 mg/dL — ABNORMAL HIGH (ref 65–99)
POTASSIUM: 4.3 mmol/L (ref 3.5–5.3)
SODIUM: 134 mmol/L — AB (ref 135–146)

## 2015-12-26 LAB — MAGNESIUM: Magnesium: 1.7 mg/dL (ref 1.5–2.5)

## 2015-12-26 MED ORDER — TRAZODONE HCL 100 MG PO TABS: 100.0000 mg | ORAL_TABLET | Freq: Every evening | ORAL | 3 refills | Status: DC | PRN

## 2015-12-26 MED ORDER — DULAGLUTIDE 0.75 MG/0.5ML ~~LOC~~ SOAJ: SUBCUTANEOUS | 5 refills | Status: DC

## 2015-12-26 NOTE — Patient Instructions (Addendum)
You might consider trying melatonin 3 mg at the exact same time of night for 3 weeks I'll recommend that you stop the omeprazole You can use Pepcid AC or Zantac instead Please check blood pressure and increase amlodipine from 5 mg daily to 10 mg daily if top number not consistently under 130 mmHg Check out the information at familydoctor.org entitled "Nutrition for Weight Loss: What You Need to Know about Fad Diets" Try to lose between 1-2 pounds per week by taking in fewer calories and burning off more calories You can succeed by limiting portions, limiting foods dense in calories and fat, becoming more active, and drinking 8 glasses of water a day (64 ounces) Don't skip meals, especially breakfast, as skipping meals may alter your metabolism Do not use over-the-counter weight loss pills or gimmicks that claim rapid weight loss A healthy BMI (or body mass index) is between 18.5 and 24.9 You can calculate your ideal BMI at the [place] website ClubMonetize.fr When you get the Trulicity, then swap out the Victoza for that  [place] stands for "Dietary Approaches to Stop Hypertension." The DASH eating plan is a healthy eating plan that has been shown to reduce high blood pressure (hypertension). Additional health benefits may include reducing the risk of type 2 diabetes mellitus, heart disease, and stroke. The DASH eating plan may also help with weight loss. WHAT DO I NEED TO KNOW ABOUT THE DASH EATING PLAN? For the DASH eating plan, you will follow these general guidelines:  Choose foods with a percent daily value for sodium of less than 5% (as listed on the food label).  Use salt-free seasonings or herbs instead of table salt or sea salt.  Check with your health care provider or pharmacist before using salt substitutes.  Eat lower-sodium products, often labeled as "lower sodium" or "no salt added."  Eat fresh foods.  Eat more  vegetables, fruits, and low-fat dairy products.  Choose whole grains. Look for the word "whole" as the first word in the ingredient list.  Choose fish and skinless chicken or [place] more often than red meat. Limit fish, poultry, and meat to 6 oz (170 g) each day.  Limit sweets, desserts, sugars, and sugary drinks.  Choose heart-healthy fats.  Limit cheese to 1 oz (28 g) per day.  Eat more home-cooked food and less restaurant, buffet, and fast food.  Limit fried foods.  Cook foods using methods other than frying.  Limit canned vegetables. If you do use them, rinse them well to decrease the sodium.  When eating at a restaurant, ask that your food be prepared with less salt, or no salt if possible. WHAT FOODS CAN I EAT? Seek help from a dietitian for individual calorie needs. Grains Whole grain or whole wheat bread. [place] rice. Whole grain or whole wheat pasta. Quinoa, bulgur, and whole grain cereals. Low-sodium cereals. Corn or whole wheat flour tortillas. Whole grain cornbread. Whole grain crackers. Low-sodium crackers. Vegetables Fresh or frozen vegetables (raw, steamed, roasted, or grilled). Low-sodium or reduced-sodium tomato and vegetable juices. Low-sodium or reduced-sodium tomato sauce and paste. Low-sodium or reduced-sodium canned vegetables.  Fruits All fresh, canned (in natural juice), or frozen fruits. Meat and Other Protein Products Ground beef (85% or leaner), grass-fed beef, or beef trimmed of fat. Skinless chicken or [place]. Ground chicken or [place]. Pork trimmed of fat. All fish and seafood. Eggs. Dried beans, peas, or lentils. Unsalted nuts and seeds. Unsalted canned beans. Dairy Low-fat dairy products, such as skim  or 1% milk, 2% or reduced-fat cheeses, low-fat ricotta or cottage cheese, or plain low-fat yogurt. Low-sodium or reduced-sodium cheeses. Fats and Oils Tub margarines without trans fats. Light or reduced-fat mayonnaise and salad dressings (reduced sodium).  Avocado. Safflower, olive, or canola oils. Natural peanut or almond butter. Other Unsalted popcorn and pretzels. The items listed above may not be a complete list of recommended foods or beverages. Contact your dietitian for more options. WHAT FOODS ARE NOT RECOMMENDED? Grains White bread. White pasta. White rice. Refined cornbread. Bagels and croissants. Crackers that contain trans fat. Vegetables Creamed or fried vegetables. Vegetables in a cheese sauce. Regular canned vegetables. Regular canned tomato sauce and paste. Regular tomato and vegetable juices. Fruits Dried fruits. Canned fruit in light or heavy syrup. Fruit juice. Meat and Other Protein Products Fatty cuts of meat. Ribs, chicken wings, bacon, sausage, bologna, salami, chitterlings, fatback, hot dogs, bratwurst, and packaged luncheon meats. Salted nuts and seeds. Canned beans with salt. Dairy Whole or 2% milk, cream, half-and-half, and cream cheese. Whole-fat or sweetened yogurt. Full-fat cheeses or blue cheese. Nondairy creamers and whipped toppings. Processed cheese, cheese spreads, or cheese curds. Condiments Onion and garlic salt, seasoned salt, table salt, and sea salt. Canned and packaged gravies. Worcestershire sauce. Tartar sauce. Barbecue sauce. Teriyaki sauce. Soy sauce, including reduced sodium. Steak sauce. Fish sauce. Oyster sauce. Cocktail sauce. Horseradish. Ketchup and mustard. Meat flavorings and tenderizers. Bouillon cubes. Hot sauce. Tabasco sauce. Marinades. Taco seasonings. Relishes. Fats and Oils Butter, stick margarine, lard, shortening, ghee, and bacon fat. Coconut, palm kernel, or palm oils. Regular salad dressings. Other Pickles and olives. Salted popcorn and pretzels. The items listed above may not be a complete list of foods and beverages to avoid. Contact your dietitian for more information. WHERE CAN I FIND MORE INFORMATION? National Heart, Lung, and Blood Institute:  travelstabloid.com   This information is not intended to replace advice given to you by your health care provider. Make sure you discuss any questions you have with your health care provider.   Document Released: 04/22/2011 Document Revised: 05/24/2014 Document Reviewed: 03/07/2013 Elsevier Interactive Patient Education [employer].

## 2015-12-26 NOTE — Assessment & Plan Note (Signed)
Check Mg2+; tonic water, 4 ounces at night

## 2015-12-26 NOTE — Assessment & Plan Note (Signed)
Check BP at home and can increase CCB if needed to get under 130

## 2015-12-26 NOTE — Assessment & Plan Note (Signed)
Avoid whites, check feet; foot eam by MD today; eye exam UTD; check A1c and urine microalbumin

## 2015-12-26 NOTE — Progress Notes (Signed)
BP 140/78   Pulse 100   Temp 98.7 F (37.1 C) (Oral)   Resp 16   Ht 6' 0.4" (1.839 m)   Wt 284 lb (128.8 kg)   SpO2 95%   BMI 38.09 kg/m    Subjective:    Patient ID: Marvin Bellis., male    DOB: 07-27-1955, 60 y.o.   MRN: [id number]  HPI: Marvin Verma. is a 60 y.o. male  Chief Complaint  Patient presents with  . Follow-up    Does drink a lot of water; does get up once at night to urinate; feels good Sleep has not been great; gets 3 hours of sleep and can't go back to sleep for a while; does not think he is depressed; 3 months  Has sleep apnea, has CPAP; feels rested upon awakening Does not drink much caffeine, not much on weekdays Away from home; always gone, sleeping in hotels; he does take trazodone He has been walking some, but not much for exercise  Type 2 diabetes; he knows his body; he has had this since 2000; eye exam UTD; white potatoes once a while; wheat bread; no numbness or sores; little sharp pains just fleeting  He uses omeprazole; does not really need it  Has high blood pressure; he feels better; was feeling better on the new medicine; staying with aunt and she makes dinner and wife sends food with him; rarely does fast food    Depression screen [facility] 2/9 12/26/2015 09/26/2015 05/01/2015 04/08/2015 04/02/2015  Decreased Interest 0 0 0 0 0  Down, Depressed, Hopeless 0 0 0 0 0  PHQ - 2 Score 0 0 0 0 0    No flowsheet data found.  Relevant past medical, surgical, family and social history reviewed Past Medical History:  Diagnosis Date  . Decreased libido   . History of nuclear stress test    a. 05/2014: no evidence of significant ischemia, GI uptake was noted, EF 51%, no ECG changes concerning for ischemia, normal study  . HTN (hypertension)   . Hyperlipidemia   . Hypertension   . IDDM (insulin dependent diabetes mellitus) ([place])   . Obesity   . Pharyngitis   . Sleep apnea    Past Surgical History:  Procedure Laterality Date  . AV FISTULA  REPAIR    . CARDIAC CATHETERIZATION     [facility]   . COLONOSCOPY     Family History  Problem Relation Age of Onset  . Breast cancer Mother   . Kidney failure Father   . Diabetes Father    Social History  Substance Use Topics  . Smoking status: Former Smoker    Packs/day: 0.25    Years: 25.00    Types: Cigarettes    Quit date: 05/17/1998  . Smokeless tobacco: Never Used     Comment: off and on - maybe 5 cigarettes  . Alcohol use No     Comment: previously but not currently    Interim medical history since last visit reviewed. Allergies and medications reviewed  Review of Systems Per HPI unless specifically indicated above     Objective:    BP 140/78   Pulse 100   Temp 98.7 F (37.1 C) (Oral)   Resp 16   Ht 6' 0.4" (1.839 m)   Wt 284 lb (128.8 kg)   SpO2 95%   BMI 38.09 kg/m   Wt Readings from Last 3 Encounters:  12/26/15 284 lb (128.8 kg)  09/26/15 285 lb (129.3  kg)  05/01/15 280 lb (127 kg)    Physical Exam  Constitutional: He appears well-developed and well-nourished. No distress.  obese  HENT:  Head: Normocephalic and atraumatic.  Eyes: EOM are normal. No scleral icterus.  Neck: No thyromegaly present.  Cardiovascular: Normal rate and regular rhythm.   Pulmonary/Chest: Effort normal and breath sounds normal.  Abdominal: Soft. Bowel sounds are normal. He exhibits no distension.  Musculoskeletal: He exhibits no edema.  Neurological: He is alert.  Skin: Skin is warm and dry. No pallor.  Psychiatric: He has a normal mood and affect. His behavior is normal. Judgment and thought content normal.   Diabetic Foot Form - Detailed   Diabetic Foot Exam - detailed Diabetic Foot exam was performed with the following findings:  Yes 12/26/2015  5:49 PM  Visual Foot Exam completed.:  Yes  Are the toenails ingrown?:  No Normal Range of Motion:  Yes Pulse Foot Exam completed.:  Yes  Right Dorsalis Pedis:  Present Left Dorsalis Pedis:  Present  Sensory Foot Exam  Completed.:  Yes Swelling:  No Semmes-Weinstein Monofilament Test R Site 1-Great Toe:  Pos L Site 1-Great Toe:  Pos  R Site 4:  Pos L Site 4:  Pos  R Site 5:  Pos L Site 5:  Pos          Assessment & Plan:   Problem List Items Addressed This Visit      Cardiovascular and Mediastinum   Benign essential HTN    Check BP at home and can increase CCB if needed to get under 130      Relevant Orders   BASIC METABOLIC PANEL WITH GFR (Completed)     Digestive   GERD (gastroesophageal reflux disease)    Cautioned about risk of long-term use of PPIs; will have him try avoiding potential triggers and use H2 blockers instead        Endocrine   Diabetes mellitus, type 2 (HCC)    Avoid whites, check feet; foot eam by MD today; eye exam UTD; check A1c and urine microalbumin      Relevant Medications   Dulaglutide (TRULICITY) A999333 0000000 SOPN   Other Relevant Orders   BASIC METABOLIC PANEL WITH GFR (Completed)   Microalbumin / creatinine urine ratio (Completed)   Hemoglobin A1c (Completed)     Other   Leg cramps    Check Mg2+; tonic water, 4 ounces at night      Relevant Orders   Magnesium (Completed)   Adult BMI 30+    Encouraged weight loss; see AVS       Other Visit Diagnoses   None.     Follow up plan: Return in about 3 months (around 03/27/2016) for fasting labs and visit.  An after-visit summary was printed and given to the patient at [place].  Please see the patient instructions which may contain other information and recommendations beyond what is mentioned above in the assessment and plan.  Meds ordered this encounter  Medications  . traZODone (DESYREL) 100 MG tablet    Sig: Take 1 tablet (100 mg total) by mouth at bedtime as needed for sleep.    Dispense:  90 tablet    Refill:  3  . Dulaglutide (TRULICITY) A999333 0000000 SOPN    Sig: Inject one vial into the skin once a WEEK    Dispense:  12 pen    Refill:  5    This replaces Victoza    Orders  Placed This Encounter  Procedures  . BASIC METABOLIC PANEL WITH GFR  . Microalbumin / creatinine urine ratio  . Hemoglobin A1c  . Magnesium

## 2015-12-27 LAB — MICROALBUMIN / CREATININE URINE RATIO
CREATININE, URINE: 113 mg/dL (ref 20–370)
Microalb Creat Ratio: 10 mcg/mg creat (ref <30)
Microalb, Ur: 1.1 mg/dL

## 2015-12-27 LAB — HEMOGLOBIN A1C
Hgb A1c MFr Bld: 9.5 % — ABNORMAL HIGH (ref <5.7)
MEAN PLASMA GLUCOSE: 226 mg/dL

## 2016-01-04 DIAGNOSIS — K219 Gastro-esophageal reflux disease without esophagitis: Secondary | ICD-10-CM | POA: Insufficient documentation

## 2016-01-04 NOTE — Assessment & Plan Note (Signed)
Encouraged weight loss; see AVS 

## 2016-01-04 NOTE — Assessment & Plan Note (Signed)
Cautioned about risk of long-term use of PPIs; will have him try avoiding potential triggers and use H2 blockers instead

## 2016-02-04 ENCOUNTER — Other Ambulatory Visit: Payer: Self-pay | Admitting: Family Medicine

## 2016-02-26 ENCOUNTER — Encounter: Payer: Self-pay | Admitting: Family Medicine

## 2016-02-26 ENCOUNTER — Ambulatory Visit (INDEPENDENT_AMBULATORY_CARE_PROVIDER_SITE_OTHER): Payer: 59 | Admitting: Family Medicine

## 2016-02-26 VITALS — BP 143/71 | HR 105 | Temp 98.2°F | Resp 18 | Ht 72.0 in | Wt 284.9 lb

## 2016-02-26 DIAGNOSIS — Z6835 Body mass index (BMI) 35.0-35.9, adult: Secondary | ICD-10-CM

## 2016-02-26 DIAGNOSIS — E669 Obesity, unspecified: Secondary | ICD-10-CM | POA: Diagnosis not present

## 2016-02-26 DIAGNOSIS — Z0000 Encounter for general adult medical examination without abnormal findings: Secondary | ICD-10-CM | POA: Diagnosis not present

## 2016-02-26 DIAGNOSIS — I10 Essential (primary) hypertension: Secondary | ICD-10-CM

## 2016-02-26 DIAGNOSIS — J029 Acute pharyngitis, unspecified: Secondary | ICD-10-CM | POA: Diagnosis not present

## 2016-02-26 DIAGNOSIS — IMO0001 Reserved for inherently not codable concepts without codable children: Secondary | ICD-10-CM

## 2016-02-26 MED ORDER — AMLODIPINE BESYLATE 5 MG PO TABS: 7.5000 mg | ORAL_TABLET | Freq: Every day | ORAL | 0 refills | Status: DC

## 2016-02-26 MED ORDER — AMOXICILLIN-POT CLAVULANATE 875-125 MG PO TABS: 1.0000 | ORAL_TABLET | Freq: Two times a day (BID) | ORAL | 0 refills | Status: DC

## 2016-02-26 NOTE — Progress Notes (Signed)
Name: Marvin MEZEY Sr.   MRN: [id number]    DOB: 10-Feb-1956   Date:02/26/2016       Progress Note  Subjective  Chief Complaint  Chief Complaint  Patient presents with  . Annual Exam    CPE  This patient is usually followed by Dr. Sanda Klein, new to me  HPI  Pt. Presents for a Complete Physical Exam. Last colonoscopy was 5 years ago, was reportedly normal, repeat in 5 years  Past Medical History:  Diagnosis Date  . Decreased libido   . History of nuclear stress test    a. 05/2014: no evidence of significant ischemia, GI uptake was noted, EF 51%, no ECG changes concerning for ischemia, normal study  . HTN (hypertension)   . Hyperlipidemia   . Hypertension   . IDDM (insulin dependent diabetes mellitus) ([place])   . Obesity   . Pharyngitis   . Sleep apnea     Past Surgical History:  Procedure Laterality Date  . AV FISTULA REPAIR    . CARDIAC CATHETERIZATION     [facility]   . COLONOSCOPY      Family History  Problem Relation Age of Onset  . Breast cancer Mother   . Kidney failure Father   . Diabetes Father     Social History   Social History  . Marital status: Married    Spouse name: N/A  . Number of children: N/A  . Years of education: N/A   Occupational History  . Not on file.   Social History Main Topics  . Smoking status: Former Smoker    Packs/day: 0.25    Years: 25.00    Types: Cigarettes    Quit date: 05/17/1998  . Smokeless tobacco: Never Used     Comment: off and on - maybe 5 cigarettes  . Alcohol use No     Comment: previously but not currently  . Drug use: No  . Sexual activity: Yes   Other Topics Concern  . Not on file   Social History Narrative  . No narrative on file     Current Outpatient Prescriptions:  .  amLODipine (NORVASC) 5 MG tablet, Take 1 tablet (5 mg total) by mouth daily., Disp: 90 tablet, Rfl: 3 .  aspirin 81 MG tablet, Take 81 mg by mouth daily., Disp: , Rfl:  .  Cholecalciferol (VITAMIN D) 2000 UNITS tablet, Take 2,000 Units by  mouth daily., Disp: , Rfl:  .  Dulaglutide (TRULICITY) A999333 0000000 SOPN, Inject one vial into the skin once a WEEK, Disp: 12 pen, Rfl: 5 .  Flaxseed, Linseed, (FLAXSEED OIL) 1000 MG CAPS, Take 1,000 mg by mouth daily. , Disp: , Rfl:  .  fluticasone (FLONASE) 50 MCG/ACT nasal spray, Place 2 sprays into both nostrils as needed., Disp: 16 g, Rfl: 5 .  glipiZIDE (GLUCOTROL) 10 MG tablet, TAKE 1 TABLET BY MOUTH 2  TIMES DAILY BEFORE A MEAL., Disp: 180 tablet, Rfl: 1 .  Insulin Pen Needle 32G X 6 MM MISC, 1 Package by Does not apply route once., Disp: 100 each, Rfl: 3 .  losartan (COZAAR) 100 MG tablet, Take 1 tablet (100 mg total) by mouth daily., Disp: 90 tablet, Rfl: 1 .  metFORMIN (GLUCOPHAGE) 1000 MG tablet, Take 1 tablet (1,000 mg total) by mouth 2 (two) times daily with a meal., Disp: 180 tablet, Rfl: 0 .  Omega-3 Fatty Acids (FISH OIL) 1000 MG CAPS, Take 1,000 mg by mouth daily. , Disp: , Rfl:  .  pravastatin (  PRAVACHOL) 40 MG tablet, Take 1 tablet (40 mg total) by mouth daily., Disp: 90 tablet, Rfl: 1 .  sildenafil (REVATIO) 20 MG tablet, Two to five pills (40 to 100 mg) by mouth prior to intimacy once a day; five pills equals one 20 mg pill of cialis, Disp: 50 tablet, Rfl: 11 .  traZODone (DESYREL) 100 MG tablet, Take 1 tablet (100 mg total) by mouth at bedtime as needed for sleep., Disp: 90 tablet, Rfl: 3  Allergies  Allergen Reactions  . Atorvastatin Other (See Comments)    Statins cause headaches and muscle aches     Review of Systems  Constitutional: Positive for malaise/fatigue. Negative for chills, fever and weight loss.  HENT: Positive for sore throat. Negative for congestion.   Eyes: Negative for blurred vision and double vision.  Respiratory: Positive for cough and sputum production. Negative for shortness of breath.   Cardiovascular: Negative for chest pain and leg swelling.  Gastrointestinal: Positive for heartburn (chronic heartburn). Negative for blood in stool, melena,  nausea and vomiting.  Genitourinary: Negative for dysuria, frequency and hematuria.  Musculoskeletal: Positive for back pain (sciatic nerve pain). Negative for myalgias and neck pain.  Skin: Negative for rash.  Neurological: Positive for headaches (occasional headaches).  Psychiatric/Behavioral: Negative for depression. The patient is not nervous/anxious.     Objective  Vitals:   02/26/16 1412  BP: (!) 143/71  Pulse: (!) 105  Resp: 18  Temp: 98.2 F (36.8 C)  TempSrc: Oral  SpO2: 96%  Weight: 284 lb 14.4 oz (129.2 kg)  Height: 6' (1.829 m)    Physical Exam  Constitutional: He is oriented to person, place, and time and well-developed, well-nourished, and in no distress.  HENT:  Head: Normocephalic and atraumatic.  Right Ear: Tympanic membrane and ear canal normal.  Left Ear: Tympanic membrane and ear canal normal.  Nose: Right sinus exhibits no maxillary sinus tenderness and no frontal sinus tenderness. Left sinus exhibits no maxillary sinus tenderness and no frontal sinus tenderness.  Mouth/Throat: Posterior oropharyngeal erythema present.  Nasal mucosal inflammation, turbinate hypertrophy.  Cardiovascular: Normal rate, regular rhythm, S1 normal, S2 normal and normal heart sounds.   No murmur heard. Pulmonary/Chest: Effort normal. He has no decreased breath sounds. He has no rhonchi.  Abdominal: Soft. Bowel sounds are normal. There is no tenderness.  Genitourinary: Rectum normal and prostate normal. Rectal exam shows no tenderness. Prostate is not tender.  Musculoskeletal:       Right ankle: He exhibits no swelling.       Left ankle: He exhibits no swelling.  Neurological: He is alert and oriented to person, place, and time.  Skin: Skin is warm, dry and intact.  Psychiatric: Mood, memory, affect and judgment normal.  Nursing note and vitals reviewed.    Assessment & Plan  1. Annual physical exam Completed annual physical exam, no lab work indicated as most of it has  already been obtained by PCP. Reassured  2. Sore throat Likely allergies versus viral pharyngitis. Recommended conservative therapy however if no improvement within 3-4 days he can start on Augmentin - amoxicillin-clavulanate (AUGMENTIN) 875-125 MG tablet; Take 1 tablet by mouth 2 (two) times daily.  Dispense: 14 tablet; Refill: 0  3. Benign essential HTN Increase amlodipine to 7.5 mg daily to address elevated blood pressure, follow-up with PCP - amLODipine (NORVASC) 5 MG tablet; Take 1.5 tablets (7.5 mg total) by mouth daily.  Dispense: 135 tablet; Refill: 0  4. Obesity, Class II, BMI 35-39.9, with comorbidity  Completed [facility]s paperwork for alternative to BMI goal. Patient will try to eat healthier and be more physically active.  Noble Bodie Asad A. [facility] 02/26/2016 2:25 PM

## 2016-03-08 ENCOUNTER — Other Ambulatory Visit: Payer: Self-pay

## 2016-03-09 MED ORDER — SILDENAFIL CITRATE 20 MG PO TABS: ORAL_TABLET | ORAL | 5 refills | Status: DC

## 2016-03-15 ENCOUNTER — Other Ambulatory Visit: Payer: Self-pay

## 2016-03-15 MED ORDER — METFORMIN HCL 1000 MG PO TABS: 1000.0000 mg | ORAL_TABLET | Freq: Two times a day (BID) | ORAL | 0 refills | Status: DC

## 2016-03-15 NOTE — Telephone Encounter (Signed)
Last Cr reviewed; rx approved

## 2016-03-29 ENCOUNTER — Ambulatory Visit: Payer: 59 | Admitting: Family Medicine

## 2016-04-15 ENCOUNTER — Other Ambulatory Visit: Payer: Self-pay | Admitting: Family Medicine

## 2016-04-15 DIAGNOSIS — I10 Essential (primary) hypertension: Secondary | ICD-10-CM

## 2016-04-19 ENCOUNTER — Ambulatory Visit: Payer: 59 | Admitting: Family Medicine

## 2016-04-24 ENCOUNTER — Other Ambulatory Visit: Payer: Self-pay | Admitting: Family Medicine

## 2016-04-24 NOTE — Telephone Encounter (Signed)
Last K+. Cr, SGPT reviewed; Rxs approved; appt later this month

## 2016-04-27 ENCOUNTER — Ambulatory Visit: Payer: 59 | Admitting: Family Medicine

## 2016-05-01 ENCOUNTER — Other Ambulatory Visit: Payer: Self-pay | Admitting: Family Medicine

## 2016-05-06 ENCOUNTER — Other Ambulatory Visit: Payer: Self-pay

## 2016-05-06 NOTE — Telephone Encounter (Signed)
90 Day supply

## 2016-05-06 NOTE — Telephone Encounter (Signed)
A 90 day supply was just sent on 03/15/16; he shouldn't be out until the end of January; please just make he is taking the medicine properly (not taking too much and using too fast) Thank you

## 2016-05-13 ENCOUNTER — Ambulatory Visit (INDEPENDENT_AMBULATORY_CARE_PROVIDER_SITE_OTHER): Payer: 59 | Admitting: Family Medicine

## 2016-05-13 ENCOUNTER — Encounter: Payer: Self-pay | Admitting: *Deleted

## 2016-05-13 ENCOUNTER — Encounter: Payer: Self-pay | Admitting: Family Medicine

## 2016-05-13 DIAGNOSIS — Z5181 Encounter for therapeutic drug level monitoring: Secondary | ICD-10-CM

## 2016-05-13 DIAGNOSIS — Z6838 Body mass index (BMI) 38.0-38.9, adult: Secondary | ICD-10-CM

## 2016-05-13 DIAGNOSIS — M722 Plantar fascial fibromatosis: Secondary | ICD-10-CM

## 2016-05-13 DIAGNOSIS — K439 Ventral hernia without obstruction or gangrene: Secondary | ICD-10-CM | POA: Diagnosis not present

## 2016-05-13 DIAGNOSIS — E782 Mixed hyperlipidemia: Secondary | ICD-10-CM | POA: Diagnosis not present

## 2016-05-13 DIAGNOSIS — E6609 Other obesity due to excess calories: Secondary | ICD-10-CM | POA: Diagnosis not present

## 2016-05-13 DIAGNOSIS — I10 Essential (primary) hypertension: Secondary | ICD-10-CM

## 2016-05-13 DIAGNOSIS — E1100 Type 2 diabetes mellitus with hyperosmolarity without nonketotic hyperglycemic-hyperosmolar coma (NKHHC): Secondary | ICD-10-CM | POA: Diagnosis not present

## 2016-05-13 DIAGNOSIS — IMO0001 Reserved for inherently not codable concepts without codable children: Secondary | ICD-10-CM

## 2016-05-13 MED ORDER — AMLODIPINE BESYLATE 10 MG PO TABS: 10.0000 mg | ORAL_TABLET | Freq: Every day | ORAL | 3 refills | Status: DC

## 2016-05-13 NOTE — Assessment & Plan Note (Signed)
Check A1c today; work on weight loss; goal is 220 pounds over the next 15 months of so

## 2016-05-13 NOTE — Assessment & Plan Note (Signed)
Discussed his current weight, target weight; see AVS

## 2016-05-13 NOTE — Patient Instructions (Addendum)
Try tennis ball Try frozen 16 or 20 ounce bottle, protect skin from ice Gentle stretching Let me know if not getting better and we can have you see a podiatrist Increase the amlodipine from 7.5 mg daily to 10 mg daily Your goal blood pressure is less than 130 mmHg on top. Try to follow the DASH guidelines (DASH stands for Dietary Approaches to Stop Hypertension) Try to limit the sodium in your diet.  Ideally, consume less than 1.5 grams (less than 1,500mg ) per day. Do not add salt when cooking or at the table.  Check the sodium amount on labels when shopping, and choose items lower in sodium when given a choice. Avoid or limit foods that already contain a lot of sodium. Eat a diet rich in fruits and vegetables and whole grains. Check out the information at familydoctor.org entitled "Nutrition for Weight Loss: What You Need to Know about Fad Diets" Try to lose between 1-2 pounds per week by taking in fewer calories and burning off more calories You can succeed by limiting portions, limiting foods dense in calories and fat, becoming more active, and drinking 8 glasses of water a day (64 ounces) Don't skip meals, especially breakfast, as skipping meals may alter your metabolism Do not use over-the-counter weight loss pills or gimmicks that claim rapid weight loss A healthy BMI (or body mass index) is between 18.5 and 24.9 You can calculate your ideal BMI at the [place] website ClubMonetize.fr  Plantar Fasciitis Plantar fasciitis is a painful foot condition that affects the heel. It occurs when the band of tissue that connects the toes to the heel bone (plantar fascia) becomes irritated. This can happen after exercising too much or doing other repetitive activities (overuse injury). The pain from plantar fasciitis can range from mild irritation to severe pain that makes it difficult for you to walk or move. The pain is usually worse in the morning or  after you have been sitting or lying down for a while. CAUSES This condition may be caused by:  Standing for long periods of time.  Wearing shoes that do not fit.  Doing high-impact activities, including running, aerobics, and ballet.  Being overweight.  Having an abnormal way of walking (gait).  Having tight calf muscles.  Having high arches in your feet.  Starting a new athletic activity. SYMPTOMS The main symptom of this condition is heel pain. Other symptoms include:  Pain that gets worse after activity or exercise.  Pain that is worse in the morning or after resting.  Pain that goes away after you walk for a few minutes. DIAGNOSIS This condition may be diagnosed based on your signs and symptoms. Your health care provider will also do a physical exam to check for:  A tender area on the bottom of your foot.  A high arch in your foot.  Pain when you move your foot.  Difficulty moving your foot. You may also need to have imaging studies to confirm the diagnosis. These can include:  X-rays.  Ultrasound.  MRI. TREATMENT  Treatment for plantar fasciitis depends on the severity of the condition. Your treatment may include:  Rest, ice, and over-the-counter pain medicines to manage your pain.  Exercises to stretch your calves and your plantar fascia.  A splint that holds your foot in a stretched, upward position while you sleep (night splint).  Physical therapy to relieve symptoms and prevent problems in the future.  Cortisone injections to relieve severe pain.  Extracorporeal shock wave therapy (ESWT)  to stimulate damaged plantar fascia with electrical impulses. It is often used as a last resort before surgery.  Surgery, if other treatments have not worked after 12 months. HOME CARE INSTRUCTIONS  Take medicines only as directed by your health care provider.  Avoid activities that cause pain.  Roll the bottom of your foot over a bag of ice or a bottle of  cold water. Do this for 20 minutes, 3-4 times a day.  Perform simple stretches as directed by your health care provider.  Try wearing athletic shoes with air-sole or gel-sole cushions or soft shoe inserts.  Wear a night splint while sleeping, if directed by your health care provider.  Keep all follow-up appointments with your health care provider. PREVENTION   Do not perform exercises or activities that cause heel pain.  Consider finding low-impact activities if you continue to have problems.  Lose weight if you need to. The best way to prevent plantar fasciitis is to avoid the activities that aggravate your plantar fascia. SEEK MEDICAL CARE IF:  Your symptoms do not go away after treatment with home care measures.  Your pain gets worse.  Your pain affects your ability to move or do your daily activities. This information is not intended to replace advice given to you by your health care provider. Make sure you discuss any questions you have with your health care provider. Document Released: 01/26/2001 Document Revised: 08/25/2015 Document Reviewed: 03/13/2014 Elsevier Interactive Patient Education  2017 [place] DASH stands for "Dietary Approaches to Stop Hypertension." The DASH eating plan is a healthy eating plan that has been shown to reduce high blood pressure (hypertension). Additional health benefits may include reducing the risk of type 2 diabetes mellitus, heart disease, and stroke. The DASH eating plan may also help with weight loss. What do I need to know about the DASH eating plan? For the DASH eating plan, you will follow these general guidelines:  Choose foods with less than 150 milligrams of sodium per serving (as listed on the food label).  Use salt-free seasonings or herbs instead of table salt or sea salt.  Check with your health care provider or pharmacist before using salt substitutes.  Eat lower-sodium products. These are often labeled  as "low-sodium" or "no salt added."  Eat fresh foods. Avoid eating a lot of canned foods.  Eat more vegetables, fruits, and low-fat dairy products.  Choose whole grains. Look for the word "whole" as the first word in the ingredient list.  Choose fish and skinless chicken or [place] more often than red meat. Limit fish, poultry, and meat to 6 oz (170 g) each day.  Limit sweets, desserts, sugars, and sugary drinks.  Choose heart-healthy fats.  Eat more home-cooked food and less restaurant, buffet, and fast food.  Limit fried foods.  Do not fry foods. Cook foods using methods such as baking, boiling, grilling, and broiling instead.  When eating at a restaurant, ask that your food be prepared with less salt, or no salt if possible. What foods can I eat? Seek help from a dietitian for individual calorie needs. Grains  Whole grain or whole wheat bread. Sather rice. Whole grain or whole wheat pasta. Quinoa, bulgur, and whole grain cereals. Low-sodium cereals. Corn or whole wheat flour tortillas. Whole grain cornbread. Whole grain crackers. Low-sodium crackers. Vegetables  Fresh or frozen vegetables (raw, steamed, roasted, or grilled). Low-sodium or reduced-sodium tomato and vegetable juices. Low-sodium or reduced-sodium tomato sauce and paste. Low-sodium or reduced-sodium  canned vegetables. Fruits  All fresh, canned (in natural juice), or frozen fruits. Meat and Other Protein Products  Ground beef (85% or leaner), grass-fed beef, or beef trimmed of fat. Skinless chicken or [place]. Ground chicken or [place]. Pork trimmed of fat. All fish and seafood. Eggs. Dried beans, peas, or lentils. Unsalted nuts and seeds. Unsalted canned beans. Dairy  Low-fat dairy products, such as skim or 1% milk, 2% or reduced-fat cheeses, low-fat ricotta or cottage cheese, or plain low-fat yogurt. Low-sodium or reduced-sodium cheeses. Fats and Oils  Tub margarines without trans fats. Light or reduced-fat mayonnaise  and salad dressings (reduced sodium). Avocado. Safflower, olive, or canola oils. Natural peanut or almond butter. Other  Unsalted popcorn and pretzels. The items listed above may not be a complete list of recommended foods or beverages. Contact your dietitian for more options.  What foods are not recommended? Grains  White bread. White pasta. White rice. Refined cornbread. Bagels and croissants. Crackers that contain trans fat. Vegetables  Creamed or fried vegetables. Vegetables in a cheese sauce. Regular canned vegetables. Regular canned tomato sauce and paste. Regular tomato and vegetable juices. Fruits  Canned fruit in light or heavy syrup. Fruit juice. Meat and Other Protein Products  Fatty cuts of meat. Ribs, chicken wings, bacon, sausage, bologna, salami, chitterlings, fatback, hot dogs, bratwurst, and packaged luncheon meats. Salted nuts and seeds. Canned beans with salt. Dairy  Whole or 2% milk, cream, half-and-half, and cream cheese. Whole-fat or sweetened yogurt. Full-fat cheeses or blue cheese. Nondairy creamers and whipped toppings. Processed cheese, cheese spreads, or cheese curds. Condiments  Onion and garlic salt, seasoned salt, table salt, and sea salt. Canned and packaged gravies. Worcestershire sauce. Tartar sauce. Barbecue sauce. Teriyaki sauce. Soy sauce, including reduced sodium. Steak sauce. Fish sauce. Oyster sauce. Cocktail sauce. Horseradish. Ketchup and mustard. Meat flavorings and tenderizers. Bouillon cubes. Hot sauce. Tabasco sauce. Marinades. Taco seasonings. Relishes. Fats and Oils  Butter, stick margarine, lard, shortening, ghee, and bacon fat. Coconut, palm kernel, or palm oils. Regular salad dressings. Other  Pickles and olives. Salted popcorn and pretzels. The items listed above may not be a complete list of foods and beverages to avoid. Contact your dietitian for more information.  Where can I find more information? National Heart, Lung, and Blood Institute:  travelstabloid.com This information is not intended to replace advice given to you by your health care provider. Make sure you discuss any questions you have with your health care provider. Document Released: 04/22/2011 Document Revised: 10/09/2015 Document Reviewed: 03/07/2013 Elsevier Interactive Patient Education  2017 [employer].

## 2016-05-13 NOTE — Assessment & Plan Note (Signed)
Refer to general surgeon, to ER if incarceration symptoms; hopefully, improved stomach integrity will help with exercise

## 2016-05-13 NOTE — Assessment & Plan Note (Addendum)
Increase the amlodipine; try DASH guidelines; work on weight loss; patient will check BP and monitor at home, let me know if not to goal; discussed target BP now and when he turns 61

## 2016-05-13 NOTE — Assessment & Plan Note (Signed)
Check lipids today; avoid saturated fats 

## 2016-05-13 NOTE — Assessment & Plan Note (Signed)
Discussed suspected dx; try tennis ball, ice massage, stretching; see AVS; contact me if not resolving

## 2016-05-13 NOTE — Progress Notes (Signed)
BP (!) 142/70   Pulse 84   Temp 97.9 F (36.6 C) (Oral)   Resp 16   Wt 285 lb (129.3 kg)   SpO2 97%   BMI 38.65 kg/m    Subjective:    Patient ID: Marvin Bellis., male    DOB: [birth year], 60 y.o.   MRN: [id number]  HPI: Marvin Honn. is a 60 y.o. male  Chief Complaint  Patient presents with  . Follow-up    Left foot pain along plantar fascia near the heel; tried shoe inserts; painful, then a little better; do a lot of walking with work; not tried ice or stretching  Type 2 diabetes; strong fam hx of diabetes; diagnosed in 2000; last A1c was 9.5; no dry mouth or blurred vision; not checking FSBS with my blessing; when sugars are high, he'll urinate more frequently, but that has gotten better  He has a hernia ventral; would like to see someone; would like to do more sit-ups but limtied b/c of hernia  No BP medicine today which might explain why BP is up today; numbers are 140/75 or close to that  High cholesterol; taking statin; not eating pork bacon or sausage  Depression screen [facility] 2/9 05/13/2016 02/26/2016 12/26/2015 09/26/2015 05/01/2015  Decreased Interest 0 0 0 0 0  Down, Depressed, Hopeless 0 0 0 0 0  PHQ - 2 Score 0 0 0 0 0    No flowsheet data found.  Relevant past medical, surgical, family and social history reviewed Past Medical History:  Diagnosis Date  . Decreased libido   . History of nuclear stress test    a. 05/2014: no evidence of significant ischemia, GI uptake was noted, EF 51%, no ECG changes concerning for ischemia, normal study  . HTN (hypertension)   . Hyperlipidemia   . Hypertension   . IDDM (insulin dependent diabetes mellitus) ([place])   . Obesity   . Pharyngitis   . Sleep apnea    Past Surgical History:  Procedure Laterality Date  . AV FISTULA REPAIR    . CARDIAC CATHETERIZATION     [facility]   . COLONOSCOPY     Family History  Problem Relation Age of Onset  . Breast cancer Mother   . Kidney failure Father   . Diabetes Father     Social History  Substance Use Topics  . Smoking status: Former Smoker    Packs/day: 0.25    Years: 25.00    Types: Cigarettes    Quit date: 05/17/1998  . Smokeless tobacco: Never Used     Comment: off and on - maybe 5 cigarettes  . Alcohol use No     Comment: previously but not currently    Interim medical history since last visit reviewed. Allergies and medications reviewed  Review of Systems  Respiratory: Negative for shortness of breath.   Cardiovascular: Negative for chest pain.   Per HPI unless specifically indicated above     Objective:    BP (!) 142/70   Pulse 84   Temp 97.9 F (36.6 C) (Oral)   Resp 16   Wt 285 lb (129.3 kg)   SpO2 97%   BMI 38.65 kg/m   Wt Readings from Last 3 Encounters:  05/13/16 285 lb (129.3 kg)  02/26/16 284 lb 14.4 oz (129.2 kg)  12/26/15 284 lb (128.8 kg)    Physical Exam  Constitutional: He appears well-developed and well-nourished. No distress.  obese  HENT:  Head: Normocephalic and atraumatic.  Eyes: EOM are normal. No scleral icterus.  Neck: No thyromegaly present.  Cardiovascular: Normal rate and regular rhythm.   Pulmonary/Chest: Effort normal and breath sounds normal.  Abdominal: Soft. Bowel sounds are normal. He exhibits no distension.  Musculoskeletal: He exhibits no edema.  Neurological: He is alert.  Skin: Skin is warm and dry. No pallor.  Psychiatric: He has a normal mood and affect. His behavior is normal. Judgment and thought content normal.   Diabetic Foot Form - Detailed   Diabetic Foot Exam - detailed Diabetic Foot exam was performed with the following findings:  Yes 05/13/2016 10:44 AM  Visual Foot Exam completed.:  Yes  Are the toenails ingrown?:  No Normal Range of Motion:  Yes Pulse Foot Exam completed.:  Yes  Right Dorsalis Pedis:  Present Left Dorsalis Pedis:  Present  Sensory Foot Exam Completed.:  Yes Swelling:  No Semmes-Weinstein Monofilament Test R Site 1-Great Toe:  Pos L Site 1-Great Toe:   Pos  R Site 4:  Pos L Site 4:  Pos  R Site 5:  Pos L Site 5:  Pos    Comments:  Some discomfort along the plantar surface of the LEFT foot; slight bruise about 1/3 of the way from the heel, smaller than lima bean size     Results for orders placed or performed in visit on [id number]  BASIC METABOLIC PANEL WITH GFR  Result Value Ref Range   Sodium 134 (L) 135 - 146 mmol/L   Potassium 4.3 3.5 - 5.3 mmol/L   Chloride 102 98 - 110 mmol/L   CO2 21 20 - 31 mmol/L   Glucose, Bld 313 (H) 65 - 99 mg/dL   BUN 15 7 - 25 mg/dL   Creat 1.12 0.70 - 1.33 mg/dL   Calcium 9.4 8.6 - 10.3 mg/dL   GFR, Est African American 83 >=60 mL/min   GFR, Est Non African American 72 >=60 mL/min  Microalbumin / creatinine urine ratio  Result Value Ref Range   Creatinine, Urine 113 20 - 370 mg/dL   Microalb, Ur 1.1 Not estab mg/dL   Microalb Creat Ratio 10 <30 mcg/mg creat  Hemoglobin A1c  Result Value Ref Range   Hgb A1c MFr Bld 9.5 (H) <5.7 %   Mean Plasma Glucose 226 mg/dL  Magnesium  Result Value Ref Range   Magnesium 1.7 1.5 - 2.5 mg/dL      Assessment & Plan:   Problem List Items Addressed This Visit      Cardiovascular and Mediastinum   Benign essential HTN (Chronic)    Increase the amlodipine; try DASH guidelines; work on weight loss; patient will check BP and monitor at home, let me know if not to goal; discussed target BP now and when he turns 61      Relevant Medications   amLODipine (NORVASC) 10 MG tablet   Other Relevant Orders   COMPLETE METABOLIC PANEL WITH GFR     Endocrine   Diabetes mellitus, type 2 (HCC) (Chronic)    Check A1c today; work on weight loss; goal is 220 pounds over the next 15 months of so      Relevant Orders   Hemoglobin A1C     Musculoskeletal and Integument   Plantar fasciitis of left foot    Discussed suspected dx; try tennis ball, ice massage, stretching; see AVS; contact me if not resolving        Other   Ventral hernia without obstruction or  gangrene    Refer  to general surgeon, to ER if incarceration symptoms; hopefully, improved stomach integrity will help with exercise      Relevant Orders   Ambulatory referral to General Surgery   Obesity (Chronic)    Discussed his current weight, target weight; see AVS      Medication monitoring encounter    Check labs      Relevant Orders   COMPLETE METABOLIC PANEL WITH GFR   Hyperlipidemia (Chronic)    Check lipids today; avoid saturated fats      Relevant Medications   amLODipine (NORVASC) 10 MG tablet   Other Relevant Orders   Lipid Profile      Follow up plan: Return in about 3 months (around 08/11/2016) for diabetes and fasting labs.  An after-visit summary was printed and given to the patient at [place].  Please see the patient instructions which may contain other information and recommendations beyond what is mentioned above in the assessment and plan.  Meds ordered this encounter  Medications  . amLODipine (NORVASC) 10 MG tablet    Sig: Take 1 tablet (10 mg total) by mouth daily.    Dispense:  90 tablet    Refill:  3    We're changing dose; cancel old Rxs please    Orders Placed This Encounter  Procedures  . Hemoglobin A1C  . COMPLETE METABOLIC PANEL WITH GFR  . Lipid Profile  . Ambulatory referral to General Surgery

## 2016-05-13 NOTE — Assessment & Plan Note (Signed)
Check labs 

## 2016-05-14 ENCOUNTER — Other Ambulatory Visit: Payer: Self-pay | Admitting: Family Medicine

## 2016-05-14 ENCOUNTER — Telehealth: Payer: Self-pay | Admitting: Family Medicine

## 2016-05-14 DIAGNOSIS — E1165 Type 2 diabetes mellitus with hyperglycemia: Secondary | ICD-10-CM

## 2016-05-14 LAB — COMPREHENSIVE METABOLIC PANEL
ALBUMIN: 4.2 g/dL (ref 3.5–5.5)
ALK PHOS: 77 IU/L (ref 39–117)
ALT: 13 IU/L (ref 0–44)
AST: 29 IU/L (ref 0–40)
Albumin/Globulin Ratio: 1.5 (ref 1.2–2.2)
BUN / CREAT RATIO: 15 (ref 9–20)
BUN: 17 mg/dL (ref 6–24)
Bilirubin Total: 0.4 mg/dL (ref 0.0–1.2)
CALCIUM: 9.5 mg/dL (ref 8.7–10.2)
CO2: 24 mmol/L (ref 18–29)
CREATININE: 1.17 mg/dL (ref 0.76–1.27)
Chloride: 98 mmol/L (ref 96–106)
GFR calc Af Amer: 78 mL/min/{1.73_m2} (ref >59)
GFR, EST NON AFRICAN AMERICAN: 68 mL/min/{1.73_m2} (ref >59)
GLOBULIN, TOTAL: 2.8 g/dL (ref 1.5–4.5)
GLUCOSE: 246 mg/dL — AB (ref 65–99)
Potassium: 4.5 mmol/L (ref 3.5–5.2)
SODIUM: 136 mmol/L (ref 134–144)
Total Protein: 7 g/dL (ref 6.0–8.5)

## 2016-05-14 LAB — HEMOGLOBIN A1C
ESTIMATED AVERAGE GLUCOSE: 278 mg/dL
HEMOGLOBIN A1C: 11.3 % — AB (ref 4.8–5.6)

## 2016-05-14 LAB — LIPID PANEL
CHOLESTEROL TOTAL: 158 mg/dL (ref 100–199)
Chol/HDL Ratio: 4.1 ratio units (ref 0.0–5.0)
HDL: 39 mg/dL — ABNORMAL LOW (ref >39)
LDL Calculated: 95 mg/dL (ref 0–99)
Triglycerides: 120 mg/dL (ref 0–149)
VLDL CHOLESTEROL CAL: 24 mg/dL (ref 5–40)

## 2016-05-14 LAB — AMBIG ABBREV CMP14 DEFAULT

## 2016-05-14 MED ORDER — DULAGLUTIDE 1.5 MG/0.5ML ~~LOC~~ SOAJ: SUBCUTANEOUS | 2 refills | Status: DC

## 2016-05-14 NOTE — Assessment & Plan Note (Signed)
Refer to endo; increase trulicity

## 2016-05-14 NOTE — Telephone Encounter (Signed)
Patient states that you called him therefore he is returning your call. [phone]

## 2016-05-14 NOTE — Progress Notes (Signed)
Increase dose of trulicity Called pt, left message, diabetes out of control, need to refer to endo; call me back or write me back to review med changes in the meantime; urged him to work on weight loss, has to be a key priority for 2018; avoid starches, sweets, white rice, potatoes, etc.

## 2016-05-14 NOTE — Telephone Encounter (Signed)
I talked with patient; he agrees with increasing trulicity, will use two of the 0.75 pens (different sites) to equal 1.5 to use up what he has; glucophage maxed; he does not want to increase sulfonylurea; he sounds determined to make positive changes in his eating Referral to endo discussed; I'll certainly be glad to take his care back over once numbers controlled

## 2016-05-18 MED ORDER — METFORMIN HCL 1000 MG PO TABS: 1000.0000 mg | ORAL_TABLET | Freq: Two times a day (BID) | ORAL | 1 refills | Status: DC

## 2016-05-18 NOTE — Telephone Encounter (Signed)
Cr 1.17; rx approved

## 2016-05-19 ENCOUNTER — Encounter: Payer: Self-pay | Admitting: General Surgery

## 2016-05-19 ENCOUNTER — Ambulatory Visit (INDEPENDENT_AMBULATORY_CARE_PROVIDER_SITE_OTHER): Payer: 59 | Admitting: General Surgery

## 2016-05-19 VITALS — BP 132/74 | HR 78 | Resp 14 | Ht 73.0 in | Wt 284.0 lb

## 2016-05-19 DIAGNOSIS — K429 Umbilical hernia without obstruction or gangrene: Secondary | ICD-10-CM | POA: Diagnosis not present

## 2016-05-19 NOTE — Patient Instructions (Addendum)
The patient is aware to call back for any questions or concerns. Proper lifting techniques  Umbilical Hernia, Adult A hernia is a bulge of tissue that pushes through an opening between muscles. An umbilical hernia happens in the abdomen, near the belly button (umbilicus). The hernia may contain tissues from the small intestine, large intestine, or fatty tissue covering the intestines (omentum). Umbilical hernias in adults tend to get worse over time, and they require surgical treatment. There are several types of umbilical hernias. You may have:  A hernia located just above or below the umbilicus (indirect hernia). This is the most common type of umbilical hernia in adults.  A hernia that forms through an opening formed by the umbilicus (direct hernia).  A hernia that comes and goes (reducible hernia). A reducible hernia may be visible only when you strain, lift something heavy, or cough. This type of hernia can be pushed back into the abdomen (reduced).  A hernia that traps abdominal tissue inside the hernia (incarcerated hernia). This type of hernia cannot be reduced.  A hernia that cuts off blood flow to the tissues inside the hernia (strangulated hernia). The tissues can start to die if this happens. This type of hernia requires emergency treatment. What are the causes? An umbilical hernia happens when tissue inside the abdomen presses on a weak area of the abdominal muscles. What increases the risk? You may have a greater risk of this condition if you:  Are obese.  Have had several pregnancies.  Have a buildup of fluid inside your abdomen (ascites).  Have had surgery that weakens the abdominal muscles. What are the signs or symptoms? The main symptom of this condition is a painless bulge at or near the belly button. A reducible hernia may be visible only when you strain, lift something heavy, or cough. Other symptoms may include:  Dull pain.  A feeling of pressure. Symptoms of  a strangulated hernia may include:  Pain that gets increasingly worse.  Nausea and vomiting.  Pain when pressing on the hernia.  Skin over the hernia becoming red or purple.  Constipation.  Blood in the stool. How is this diagnosed? This condition may be diagnosed based on:  A physical exam. You may be asked to cough or strain while standing. These actions increase the pressure inside your abdomen and force the hernia through the opening in your muscles. Your health care provider may try to reduce the hernia by pressing on it.  Your symptoms and medical history. How is this treated? Surgery is the only treatment for an umbilical hernia. Surgery for a strangulated hernia is done as soon as possible. If you have a small hernia that is not incarcerated, you may need to lose weight before having surgery. Follow these instructions at home:  Lose weight, if told by your health care provider.  Do not try to push the hernia back in.  Watch your hernia for any changes in color or size. Tell your health care provider if any changes occur.  You may need to avoid activities that increase pressure on your hernia.  Do not lift anything that is heavier than 10 lb (4.5 kg) until your health care provider says that this is safe.  Take over-the-counter and prescription medicines only as told by your health care provider.  Keep all follow-up visits as told by your health care provider. This is important. Contact a health care provider if:  Your hernia gets larger.  Your hernia becomes painful. Get help  right away if:  You develop sudden, severe pain near the area of your hernia.  You have pain as well as nausea or vomiting.  You have pain and the skin over your hernia changes color.  You develop a fever. This information is not intended to replace advice given to you by your health care provider. Make sure you discuss any questions you have with your health care provider. Document  Released: 10/03/2015 Document Revised: 01/04/2016 Document Reviewed: 10/03/2015 Elsevier Interactive Patient Education  2017 [employer].

## 2016-05-19 NOTE — Progress Notes (Signed)
Patient ID: Marvin Bellis., male   DOB: 1956/05/10, 61 y.o.   MRN: [id number]  Chief Complaint  Patient presents with  . Other    hernia    HPI Marvin Brettschneider. is a 61 y.o. male here for evaluation of a possible ventral or umbilical hernia. He states it was first noticed around 5 years ago when he had his colonoscopy. He states it has become more uncomfortable over the past year. He states he is trying to lose some weight. No GI issues. I have reviewed the history of present illness with the patient.   HPI  Past Medical History:  Diagnosis Date  . Decreased libido   . History of nuclear stress test    a. 05/2014: no evidence of significant ischemia, GI uptake was noted, EF 51%, no ECG changes concerning for ischemia, normal study  . HTN (hypertension)   . Hyperlipidemia   . Hypertension   . IDDM (insulin dependent diabetes mellitus) ([place])   . Obesity   . Pharyngitis   . Sleep apnea    uses CPAP    Past Surgical History:  Procedure Laterality Date  . AV FISTULA REPAIR    . CARDIAC CATHETERIZATION     [facility]   . COLONOSCOPY  2013    Family History  Problem Relation Age of Onset  . Breast cancer Mother   . Kidney failure Father   . Diabetes Father   . Diabetes Sister     Social History Social History  Substance Use Topics  . Smoking status: Former Smoker    Packs/day: 0.25    Years: 25.00    Types: Cigarettes    Quit date: 05/17/1998  . Smokeless tobacco: Never Used     Comment: off and on - maybe 5 cigarettes  . Alcohol use No     Comment: previously but not currently    Allergies  Allergen Reactions  . Atorvastatin Other (See Comments)    Statins cause headaches and muscle aches    Current Outpatient Prescriptions  Medication Sig Dispense Refill  . amLODipine (NORVASC) 10 MG tablet Take 1 tablet (10 mg total) by mouth daily. 90 tablet 3  . aspirin 81 MG tablet Take 81 mg by mouth daily.    . Cholecalciferol (VITAMIN D) 2000 UNITS tablet Take 2,000  Units by mouth daily.    . Dulaglutide (TRULICITY) 1.5 0000000 SOPN Inject contents of one syringe subcutaneously once a week 4 pen 2  . Flaxseed, Linseed, (FLAXSEED OIL) 1000 MG CAPS Take 1,000 mg by mouth daily.     . fluticasone (FLONASE) 50 MCG/ACT nasal spray Place 2 sprays into both nostrils as needed. 16 g 5  . glipiZIDE (GLUCOTROL) 10 MG tablet TAKE 1 TABLET BY MOUTH 2  TIMES DAILY BEFORE A MEAL. 180 tablet 1  . Insulin Pen Needle 32G X 6 MM MISC 1 Package by Does not apply route once. 100 each 3  . losartan (COZAAR) 100 MG tablet TAKE 1 TABLET BY MOUTH  DAILY 90 tablet 3  . metFORMIN (GLUCOPHAGE) 1000 MG tablet Take 1 tablet (1,000 mg total) by mouth 2 (two) times daily with a meal. 180 tablet 1  . Omega-3 Fatty Acids (FISH OIL) 1000 MG CAPS Take 1,000 mg by mouth daily.     . pravastatin (PRAVACHOL) 40 MG tablet Take 1 tablet (40 mg total) by mouth at bedtime. 90 tablet 3  . sildenafil (REVATIO) 20 MG tablet Two to five pills (40 to 100  mg) by mouth prior to intimacy once a day; five pills equals one 20 mg pill of cialis 50 tablet 5  . traZODone (DESYREL) 100 MG tablet Take 1 tablet (100 mg total) by mouth at bedtime as needed for sleep. 90 tablet 3   No current facility-administered medications for this visit.     Review of Systems Review of Systems  Constitutional: Negative.   Respiratory: Negative.   Cardiovascular: Negative.   Gastrointestinal: Positive for abdominal pain. Negative for constipation, diarrhea, nausea and vomiting.    Blood pressure 132/74, pulse 78, resp. rate 14, height 6\' 1"  (1.854 m), weight 284 lb (128.8 kg).  Physical Exam Physical Exam  Constitutional: He is oriented to person, place, and time. He appears well-developed and well-nourished.  HENT:  Mouth/Throat: Oropharynx is clear and moist.  Eyes: Conjunctivae are normal. No scleral icterus.  Neck: Neck supple.  Cardiovascular: Normal rate, regular rhythm and normal heart sounds.    Pulmonary/Chest: Effort normal and breath sounds normal.  Abdominal: Soft. Normal appearance and bowel sounds are normal. There is no tenderness. A hernia is present. Hernia confirmed negative in the right inguinal area and confirmed negative in the left inguinal area.  Small umbilical hernia and diastasis recti is present.  Lymphadenopathy:    He has no cervical adenopathy.  Neurological: He is alert and oriented to person, place, and time.  Skin: Skin is warm and dry.  Psychiatric: He has a normal mood and affect. His behavior is normal.    Data Reviewed  None    Assessment  Small umbilical hernia. Diastasis recti which is quite prominent but has no signs of a hernia.      Plan   Pt advised on findings. No operative intervention required at present.  Proper lifting techniques reviewed. Follow up as needed or if symptoms worsen. The patient is aware to call back for any questions or concerns.      This information has been scribed by Karie Fetch RN, BSN,BC.   SANKAR,SEEPLAPUTHUR G 05/19/2016, 2:20 PM

## 2016-06-10 ENCOUNTER — Other Ambulatory Visit: Payer: Self-pay | Admitting: Family Medicine

## 2016-06-25 ENCOUNTER — Ambulatory Visit (INDEPENDENT_AMBULATORY_CARE_PROVIDER_SITE_OTHER): Payer: 59 | Admitting: Family Medicine

## 2016-06-25 ENCOUNTER — Encounter: Payer: Self-pay | Admitting: Family Medicine

## 2016-06-25 VITALS — BP 130/78 | HR 99 | Temp 101.2°F | Resp 18 | Wt 279.2 lb

## 2016-06-25 DIAGNOSIS — J101 Influenza due to other identified influenza virus with other respiratory manifestations: Secondary | ICD-10-CM | POA: Diagnosis not present

## 2016-06-25 DIAGNOSIS — R509 Fever, unspecified: Secondary | ICD-10-CM

## 2016-06-25 LAB — POCT INFLUENZA A/B
INFLUENZA A, POC: POSITIVE — AB
INFLUENZA B, POC: NEGATIVE

## 2016-06-25 MED ORDER — BENZONATATE 100 MG PO CAPS
100.0000 mg | ORAL_CAPSULE | Freq: Three times a day (TID) | ORAL | 0 refills | Status: DC | PRN
Start: 2016-06-25 — End: 2016-06-25

## 2016-06-25 MED ORDER — FLUTICASONE PROPIONATE 50 MCG/ACT NA SUSP: 2.0000 | NASAL | 11 refills | Status: DC | PRN

## 2016-06-25 MED ORDER — OSELTAMIVIR PHOSPHATE 75 MG PO CAPS: 75.0000 mg | ORAL_CAPSULE | Freq: Two times a day (BID) | ORAL | 0 refills | Status: AC

## 2016-06-25 MED ORDER — BENZONATATE 100 MG PO CAPS: 100.0000 mg | ORAL_CAPSULE | Freq: Three times a day (TID) | ORAL | 0 refills | Status: DC | PRN

## 2016-06-25 MED ORDER — FLUTICASONE PROPIONATE 50 MCG/ACT NA SUSP: 2.0000 | NASAL | 3 refills | Status: DC | PRN

## 2016-06-25 NOTE — Patient Instructions (Addendum)
Start the Tamiflu Use the tessalon perles if needed for cough Flu is very contagious, so please take extra care and caution If you develop signs or symptoms or pneumonia, dehydration, or worsening of your illness, please go to the emergency room   Influenza, Adult Influenza, more commonly known as "the flu," is a viral infection that primarily affects the respiratory tract. The respiratory tract includes organs that help you breathe, such as the lungs, nose, and throat. The flu causes many common cold symptoms, as well as a high fever and body aches. The flu spreads easily from person to person (is contagious). Getting a flu shot (influenza vaccination) every year is the best way to prevent influenza. What are the causes? Influenza is caused by a virus. You can catch the virus by:  Breathing in droplets from an infected person's cough or sneeze.  Touching something that was recently contaminated with the virus and then touching your mouth, nose, or eyes. What increases the risk? The following factors may make you more likely to get the flu:  Not cleaning your hands frequently with soap and water or alcohol-based hand sanitizer.  Having close contact with many people during cold and flu season.  Touching your mouth, eyes, or nose without washing or sanitizing your hands first.  Not drinking enough fluids or not eating a healthy diet.  Not getting enough sleep or exercise.  Being under a high amount of stress.  Not getting a yearly (annual) flu shot. You may be at a higher risk of complications from the flu, such as a severe lung infection (pneumonia), if you:  Are over the age of 38.  Are pregnant.  Have a weakened disease-fighting system (immune system). You may have a weakened immune system if you:  Have HIV or AIDS.  Are undergoing chemotherapy.  Aretaking medicines that reduce the activity of (suppress) the immune system.  Have a long-term (chronic) illness, such as  heart disease, kidney disease, diabetes, or lung disease.  Have a liver disorder.  Are obese.  Have anemia. What are the signs or symptoms? Symptoms of this condition typically last 4-10 days and may include:  Fever.  Chills.  Headache, body aches, or muscle aches.  Sore throat.  Cough.  Runny or congested nose.  Chest discomfort and cough.  Poor appetite.  Weakness or tiredness (fatigue).  Dizziness.  Nausea or vomiting. How is this diagnosed? This condition may be diagnosed based on your medical history and a physical exam. Your health care provider may do a nose or throat swab test to confirm the diagnosis. How is this treated? If influenza is detected early, you can be treated with antiviral medicine that can reduce the length of your illness and the severity of your symptoms. This medicine may be given by mouth (orally) or through an IV tube that is inserted in one of your veins. The goal of treatment is to relieve symptoms by taking care of yourself at home. This may include taking over-the-counter medicines, drinking plenty of fluids, and adding humidity to the air in your home. In some cases, influenza goes away on its own. Severe influenza or complications from influenza may be treated in a hospital. Follow these instructions at home:  Take over-the-counter and prescription medicines only as told by your health care provider.  Use a cool mist humidifier to add humidity to the air in your home. This can make breathing easier.  Rest as needed.  Drink enough fluid to keep your urine  clear or pale yellow.  Cover your mouth and nose when you cough or sneeze.  Wash your hands with soap and water often, especially after you cough or sneeze. If soap and water are not available, use hand sanitizer.  Stay home from work or school as told by your health care provider. Unless you are visiting your health care provider, try to avoid leaving home until your fever has  been gone for 24 hours without the use of medicine.  Keep all follow-up visits as told by your health care provider. This is important. How is this prevented?  Getting an annual flu shot is the best way to avoid getting the flu. You may get the flu shot in late summer, fall, or winter. Ask your health care provider when you should get your flu shot.  Wash your hands often or use hand sanitizer often.  Avoid contact with people who are sick during cold and flu season.  Eat a healthy diet, drink plenty of fluids, get enough sleep, and exercise regularly. Contact a health care provider if:  You develop new symptoms.  You have:  Chest pain.  Diarrhea.  A fever.  Your cough gets worse.  You produce more mucus.  You feel nauseous or you vomit. Get help right away if:  You develop shortness of breath or difficulty breathing.  Your skin or nails turn a bluish color.  You have severe pain or stiffness in your neck.  You develop a sudden headache or sudden pain in your face or ear.  You cannot stop vomiting. This information is not intended to replace advice given to you by your health care provider. Make sure you discuss any questions you have with your health care provider. Document Released: 04/30/2000 Document Revised: 10/09/2015 Document Reviewed: 02/25/2015 Elsevier Interactive Patient Education  2017 [employer].
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
## 2016-06-28 ENCOUNTER — Other Ambulatory Visit: Payer: Self-pay

## 2016-06-28 MED ORDER — FLUTICASONE PROPIONATE 50 MCG/ACT NA SUSP: 2.0000 | Freq: Every day | NASAL | 3 refills | Status: DC

## 2016-06-28 NOTE — Telephone Encounter (Signed)
Need 90 day and also pharmacy wants clarification on instructions.

## 2016-06-28 NOTE — Telephone Encounter (Signed)
New Rx with new Sig sent to pharmacy

## 2016-07-22 ENCOUNTER — Other Ambulatory Visit: Payer: Self-pay | Admitting: Family Medicine

## 2016-07-22 NOTE — Telephone Encounter (Signed)
Requesting refill on trulicity [facility] send to [facility]. Have enough for this weekend but will be out for next week.

## 2016-07-23 MED ORDER — DULAGLUTIDE 1.5 MG/0.5ML ~~LOC~~ SOAJ: SUBCUTANEOUS | 3 refills | Status: DC

## 2016-08-12 ENCOUNTER — Ambulatory Visit (INDEPENDENT_AMBULATORY_CARE_PROVIDER_SITE_OTHER): Payer: 59 | Admitting: Family Medicine

## 2016-08-12 ENCOUNTER — Encounter: Payer: Self-pay | Admitting: Family Medicine

## 2016-08-12 VITALS — BP 138/72 | HR 92 | Temp 97.9°F | Resp 16 | Wt 283.4 lb

## 2016-08-12 DIAGNOSIS — E782 Mixed hyperlipidemia: Secondary | ICD-10-CM

## 2016-08-12 DIAGNOSIS — G4739 Other sleep apnea: Secondary | ICD-10-CM

## 2016-08-12 DIAGNOSIS — IMO0001 Reserved for inherently not codable concepts without codable children: Secondary | ICD-10-CM

## 2016-08-12 DIAGNOSIS — Z5181 Encounter for therapeutic drug level monitoring: Secondary | ICD-10-CM

## 2016-08-12 DIAGNOSIS — Z6838 Body mass index (BMI) 38.0-38.9, adult: Secondary | ICD-10-CM

## 2016-08-12 DIAGNOSIS — I10 Essential (primary) hypertension: Secondary | ICD-10-CM | POA: Diagnosis not present

## 2016-08-12 DIAGNOSIS — J0100 Acute maxillary sinusitis, unspecified: Secondary | ICD-10-CM

## 2016-08-12 DIAGNOSIS — E6609 Other obesity due to excess calories: Secondary | ICD-10-CM

## 2016-08-12 DIAGNOSIS — E1165 Type 2 diabetes mellitus with hyperglycemia: Secondary | ICD-10-CM

## 2016-08-12 MED ORDER — AMOXICILLIN-POT CLAVULANATE 875-125 MG PO TABS: 1.0000 | ORAL_TABLET | Freq: Two times a day (BID) | ORAL | 0 refills | Status: AC

## 2016-08-12 NOTE — Assessment & Plan Note (Signed)
Trouble losing weight; drinking adequate water; trouble exercising due to abdominal hernia; check thyroid

## 2016-08-12 NOTE — Patient Instructions (Addendum)
Try to use PLAIN allergy and cold medicine without the decongestant Avoid: phenylephrine, phenylpropanolamine, and pseudoephredine   Start the antibiotics for the sinus infection Please do eat yogurt daily or take a probiotic daily for the next month We want to replace the healthy germs in the gut If you notice foul, watery diarrhea in the next two months, schedule an appointment RIGHT AWAY  Try to limit saturated fats in your diet (bologna, hot dogs, barbeque, cheeseburgers, hamburgers, steak, bacon, sausage, cheese, etc.) and get more fresh fruits, vegetables, and whole grains

## 2016-08-12 NOTE — Assessment & Plan Note (Signed)
Limit saturated fats 

## 2016-08-12 NOTE — Assessment & Plan Note (Signed)
Wearing CPAP

## 2016-08-12 NOTE — Assessment & Plan Note (Addendum)
Work on weight loss, DASH guidelines, less salt; avoid decongestants; he does not want to adjust medicine, but try on his own

## 2016-08-12 NOTE — Assessment & Plan Note (Signed)
Check A1c today; foot exam by MD; not checking FSBS with my blessing, citing ACE guidelines

## 2016-08-12 NOTE — Progress Notes (Signed)
BP 138/72   Pulse 92   Temp 97.9 F (36.6 C) (Oral)   Resp 16   Wt 283 lb 6.4 oz (128.5 kg)   SpO2 96%   BMI 37.39 kg/m    Subjective:    Patient ID: Marvin Bellis., male    DOB: [birth year], 61 y.o.   MRN: [id number]  HPI: Marvin Curl. is a 61 y.o. male  Chief Complaint  Patient presents with  . Follow-up   Here for f/u Type 2 diabetes; not checking FSBS with my blessing; staying away from white bread and white bread; no blurred vision and no dry mouth; no problems with eyes Lab Results  Component Value Date   HGBA1C 11.3 (H) 05/13/2016    HTN; not checking BP; has been using theraflu lately BP Readings from Last 3 Encounters:  08/12/16 138/72  06/25/16 130/78  05/19/16 132/74   Sinus problems; bothered with congestion, discomfort since having the flu  Cholesterol; trying to avoid fatty meats; eggs just once a week; on statin  Fluctuating weight; trouble losing weight; not sure about any family hx of thyroid disease  OSA; using CPAP  Depression screen [facility] 2/9 08/12/2016 06/25/2016 05/13/2016 02/26/2016 12/26/2015  Decreased Interest 0 0 0 0 0  Down, Depressed, Hopeless 0 0 0 0 0  PHQ - 2 Score 0 0 0 0 0    Relevant past medical, surgical, family and social history reviewed Past Medical History:  Diagnosis Date  . Decreased libido   . History of nuclear stress test    a. 05/2014: no evidence of significant ischemia, GI uptake was noted, EF 51%, no ECG changes concerning for ischemia, normal study  . HTN (hypertension)   . Hyperlipidemia   . Hypertension   . IDDM (insulin dependent diabetes mellitus) ([place])   . Obesity   . Pharyngitis   . Sleep apnea    uses CPAP   Past Surgical History:  Procedure Laterality Date  . AV FISTULA REPAIR    . CARDIAC CATHETERIZATION     [facility]   . COLONOSCOPY  2013   Family History  Problem Relation Age of Onset  . Breast cancer Mother   . Cancer Mother   . Kidney failure Father   . Diabetes Father   . Alcohol  abuse Father   . Diabetes Sister    Social History  Substance Use Topics  . Smoking status: Former Smoker    Packs/day: 0.25    Years: 25.00    Types: Cigarettes    Quit date: 05/17/1998  . Smokeless tobacco: Never Used     Comment: off and on - maybe 5 cigarettes  . Alcohol use No     Comment: previously but not currently    Interim medical history since last visit reviewed. Allergies and medications reviewed  Review of Systems Per HPI unless specifically indicated above     Objective:    BP 138/72   Pulse 92   Temp 97.9 F (36.6 C) (Oral)   Resp 16   Wt 283 lb 6.4 oz (128.5 kg)   SpO2 96%   BMI 37.39 kg/m   Wt Readings from Last 3 Encounters:  08/12/16 283 lb 6.4 oz (128.5 kg)  06/25/16 279 lb 3.2 oz (126.6 kg)  05/19/16 284 lb (128.8 kg)    Physical Exam  Constitutional: He appears well-developed and well-nourished. No distress.  HENT:  Head: Normocephalic and atraumatic.  Right Ear: Hearing, tympanic membrane, external ear and  ear canal normal.  Left Ear: Hearing, tympanic membrane, external ear and ear canal normal.  Nose: Rhinorrhea (cloudy, yellow) present.  Mouth/Throat: Oropharynx is clear and moist and mucous membranes are normal.  Mucosa is erythematous  Eyes: EOM are normal. No scleral icterus.  Neck: No thyromegaly present.  Cardiovascular: Normal rate and regular rhythm.   Pulmonary/Chest: Effort normal and breath sounds normal.  Abdominal: Soft. Bowel sounds are normal. He exhibits no distension.  Musculoskeletal: He exhibits no edema.  Lymphadenopathy:    He has no cervical adenopathy.  Neurological: Coordination normal.  Skin: Skin is warm and dry. No pallor.  Psychiatric: He has a normal mood and affect. His behavior is normal. Judgment and thought content normal.   Diabetic Foot Form - Detailed   Diabetic Foot Exam - detailed Diabetic Foot exam was performed with the following findings:  Yes 08/12/2016  8:32 AM  Visual Foot Exam  completed.:  Yes  Are the toenails ingrown?:  No Normal Range of Motion:  Yes Pulse Foot Exam completed.:  Yes  Right Dorsalis Pedis:  Present Left Dorsalis Pedis:  Present  Sensory Foot Exam Completed.:  Yes Swelling:  No Semmes-Weinstein Monofilament Test R Site 1-Great Toe:  Pos L Site 1-Great Toe:  Pos  R Site 4:  Pos L Site 4:  Pos  R Site 5:  Pos L Site 5:  Pos       Results for orders placed or performed in visit on 06/25/16  POCT Influenza A/B  Result Value Ref Range   Influenza A, POC Positive (A) Negative   Influenza B, POC Negative Negative      Assessment & Plan:   Problem List Items Addressed This Visit      Cardiovascular and Mediastinum   Benign essential HTN (Chronic)    Work on weight loss, DASH guidelines, less salt; avoid decongestants; he does not want to adjust medicine, but try on his own        Respiratory   Sleep apnea (Chronic)    Wearing CPAP        Endocrine   Diabetes mellitus, type 2 (HCC) - Primary (Chronic)    Check A1c today; foot exam by MD; not checking FSBS with my blessing, citing ACE guidelines      Relevant Orders   Hemoglobin A1c     Other   Obesity (Chronic)    Trouble losing weight; drinking adequate water; trouble exercising due to abdominal hernia; check thyroid      Relevant Orders   TSH   T4, free   T3, free   T3, free   T4, free   TSH   Medication monitoring encounter   Relevant Orders   Comprehensive Metabolic Panel (CMET)   Hyperlipidemia (Chronic)    Limit saturated fats       Relevant Orders   Lipid panel    Other Visit Diagnoses    Acute non-recurrent maxillary sinusitis       antibiotics; risk of C diff, see AVS; avoid decongestants   Relevant Medications   amoxicillin-clavulanate (AUGMENTIN) 875-125 MG tablet      Follow up plan: Return in about 3 months (around 11/12/2016) for diabetes follow-up.  An after-visit summary was printed and given to the patient at [place].  Please see the  patient instructions which may contain other information and recommendations beyond what is mentioned above in the assessment and plan.  Meds ordered this encounter  Medications  . amoxicillin-clavulanate (AUGMENTIN) 875-125 MG tablet  Sig: Take 1 tablet by mouth 2 (two) times daily.    Dispense:  20 tablet    Refill:  0    Orders Placed This Encounter  Procedures  . Hemoglobin A1c  . Lipid panel  . Comprehensive Metabolic Panel (CMET)  . TSH  . T4, free  . T3, free  . T3, free  . T4, free  . TSH   [facility]

## 2016-08-13 DIAGNOSIS — H2513 Age-related nuclear cataract, bilateral: Secondary | ICD-10-CM | POA: Diagnosis not present

## 2016-08-13 DIAGNOSIS — E119 Type 2 diabetes mellitus without complications: Secondary | ICD-10-CM | POA: Diagnosis not present

## 2016-08-13 DIAGNOSIS — E089 Diabetes mellitus due to underlying condition without complications: Secondary | ICD-10-CM | POA: Diagnosis not present

## 2016-08-13 LAB — COMPREHENSIVE METABOLIC PANEL
ALT: 11 IU/L (ref 0–44)
AST: 16 IU/L (ref 0–40)
Albumin/Globulin Ratio: 1.4 (ref 1.2–2.2)
Albumin: 4.2 g/dL (ref 3.5–5.5)
Alkaline Phosphatase: 74 IU/L (ref 39–117)
BUN/Creatinine Ratio: 14 (ref 9–20)
BUN: 16 mg/dL (ref 6–24)
Bilirubin Total: 0.4 mg/dL (ref 0.0–1.2)
CALCIUM: 9.7 mg/dL (ref 8.7–10.2)
CHLORIDE: 96 mmol/L (ref 96–106)
CO2: 21 mmol/L (ref 18–29)
Creatinine, Ser: 1.17 mg/dL (ref 0.76–1.27)
GFR, EST AFRICAN AMERICAN: 78 mL/min/{1.73_m2} (ref >59)
GFR, EST NON AFRICAN AMERICAN: 68 mL/min/{1.73_m2} (ref >59)
GLUCOSE: 245 mg/dL — AB (ref 65–99)
Globulin, Total: 3.1 g/dL (ref 1.5–4.5)
Potassium: 4.6 mmol/L (ref 3.5–5.2)
Sodium: 133 mmol/L — ABNORMAL LOW (ref 134–144)
TOTAL PROTEIN: 7.3 g/dL (ref 6.0–8.5)

## 2016-08-13 LAB — LIPID PANEL
CHOL/HDL RATIO: 3.8 ratio (ref 0.0–5.0)
Cholesterol, Total: 154 mg/dL (ref 100–199)
HDL: 41 mg/dL (ref >39)
LDL Calculated: 93 mg/dL (ref 0–99)
Triglycerides: 101 mg/dL (ref 0–149)
VLDL Cholesterol Cal: 20 mg/dL (ref 5–40)

## 2016-08-13 LAB — HEMOGLOBIN A1C
Est. average glucose Bld gHb Est-mCnc: 255 mg/dL
Hgb A1c MFr Bld: 10.5 % — ABNORMAL HIGH (ref 4.8–5.6)

## 2016-08-13 LAB — T4, FREE: Free T4: 1.2 ng/dL (ref 0.82–1.77)

## 2016-08-13 LAB — TSH: TSH: 1.8 u[IU]/mL (ref 0.450–4.500)

## 2016-08-13 LAB — T3, FREE: T3 FREE: 3.4 pg/mL (ref 2.0–4.4)

## 2016-08-25 LAB — HM DIABETES EYE EXAM

## 2016-09-28 ENCOUNTER — Other Ambulatory Visit: Payer: Self-pay | Admitting: Family Medicine

## 2016-10-28 ENCOUNTER — Encounter: Payer: Self-pay | Admitting: Family Medicine

## 2016-10-28 ENCOUNTER — Ambulatory Visit (INDEPENDENT_AMBULATORY_CARE_PROVIDER_SITE_OTHER): Payer: 59 | Admitting: Family Medicine

## 2016-10-28 VITALS — BP 144/86 | HR 94 | Temp 98.3°F | Resp 16 | Ht 73.0 in | Wt 280.2 lb

## 2016-10-28 DIAGNOSIS — I10 Essential (primary) hypertension: Secondary | ICD-10-CM

## 2016-10-28 DIAGNOSIS — K439 Ventral hernia without obstruction or gangrene: Secondary | ICD-10-CM | POA: Diagnosis not present

## 2016-10-28 NOTE — Progress Notes (Addendum)
Name: Marvin RAHAL Sr.   MRN: [id number]    DOB: 08-May-1956   Date:10/28/2016       Progress Note  Subjective  Chief Complaint  Chief Complaint  Patient presents with  . paperwork for new job    HPI  PT presents for BP check and abdominal hernia check for his job.   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  He was feeling great today, and when he got there he got a little anxious and his BP read 163/92, 170/84 today.  No chest pain, shortness of breath, palpitations, headache, blurred vision or weakness.  He took his BP at home this morning and it was 138/72.  He has consistently had BP's that are at goal in office and has been compliant with his medications.  Abdominal Hernia: This physical screening for his job also requests evaluation of abdominal hernia. He was seen by Dr. Jamal Collin on 05/19/2016 regarding hernia. Dr. Jamal Collin advised no operative intervention at the present time, and patient is to follow proper lifting techniques and follow up with him as needed.  Patient Active Problem List   Diagnosis Date Noted  . Ventral hernia without obstruction or gangrene 05/13/2016  . Plantar fasciitis of left foot 05/13/2016  . GERD (gastroesophageal reflux disease) 01/04/2016  . Leg cramps 12/26/2015  . Medication monitoring encounter 09/26/2015  . Screening for HIV (human immunodeficiency virus) 09/26/2015  . Need for hepatitis C screening test 09/26/2015  . Prostate cancer screening 09/26/2015  . Chest pain with moderate risk for cardiac etiology 05/01/2015  . Drug intolerance 03/06/2015  . Obesity 03/06/2015  . Diabetes mellitus, type 2 ([place]) 03/06/2015  . Acute anxiety 10/24/2014  . ED (erectile dysfunction) 10/24/2014  . Pain in the chest 05/23/2014  . Hyperlipidemia 05/23/2014  . Sleep apnea 05/23/2014  . Benign essential HTN 02/14/2007   Social History  Substance Use Topics  . Smoking status: Former  Smoker    Packs/day: 0.25    Years: 25.00    Types: Cigarettes    Quit date: 05/17/1998  . Smokeless tobacco: Never Used     Comment: off and on - maybe 5 cigarettes  . Alcohol use No     Comment: previously but not currently    Current Outpatient Prescriptions:  .  amLODipine (NORVASC) 10 MG tablet, Take 1 tablet (10 mg total) by mouth daily., Disp: 90 tablet, Rfl: 3 .  aspirin 81 MG tablet, Take 81 mg by mouth daily., Disp: , Rfl:  .  Cholecalciferol (VITAMIN D) 2000 UNITS tablet, Take 2,000 Units by mouth daily., Disp: , Rfl:  .  Dulaglutide (TRULICITY) 1.5 CV/8.9FY SOPN, Inject contents of one syringe subcutaneously once a week, Disp: 12 pen, Rfl: 3 .  Flaxseed, Linseed, (FLAXSEED OIL) 1000 MG CAPS, Take 1,000 mg by mouth daily. , Disp: , Rfl:  .  fluticasone (FLONASE) 50 MCG/ACT nasal spray, Place 2 sprays into both nostrils daily. (if needed for allergy symptoms), Disp: 48 g, Rfl: 3 .  glipiZIDE (GLUCOTROL) 10 MG tablet, TAKE 1 TABLET BY MOUTH 2  TIMES DAILY BEFORE A MEAL., Disp: 180 tablet, Rfl: 1 .  Insulin Pen Needle 32G X 6 MM MISC, 1 Package by Does not apply route once., Disp: 100 each, Rfl: 3 .  losartan (COZAAR) 100 MG tablet, TAKE 1 TABLET BY MOUTH  DAILY, Disp: 90 tablet, Rfl: 3 .  metFORMIN (GLUCOPHAGE) 1000 MG tablet, TAKE 1  TABLET BY MOUTH TWO  TIMES DAILY WITH MEALS, Disp: 180 tablet, Rfl: 1 .  pravastatin (PRAVACHOL) 40 MG tablet, Take 1 tablet (40 mg total) by mouth at bedtime., Disp: 90 tablet, Rfl: 3 .  sildenafil (REVATIO) 20 MG tablet, Two to five pills (40 to 100 mg) by mouth prior to intimacy once a day; five pills equals one 20 mg pill of cialis, Disp: 50 tablet, Rfl: 5 .  traZODone (DESYREL) 100 MG tablet, TAKE 1 TABLET BY MOUTH AT  BEDTIME AS NEEDED FOR SLEEP, Disp: 90 tablet, Rfl: 3  Allergies  Allergen Reactions  . Atorvastatin Other (See Comments)    Statins cause headaches and muscle aches    ROS  Constitutional: Negative for fever or weight change.   Respiratory: Negative for cough and shortness of breath.   Cardiovascular: Negative for chest pain or palpitations.  Gastrointestinal: Negative for abdominal pain, no bowel changes.  Musculoskeletal: Negative for gait problem or joint swelling.  Skin: Negative for rash.  Neurological: Negative for dizziness or headache.  No other specific complaints in a complete review of systems (except as listed in HPI above).  Objective  Vitals:   10/28/16 1433 10/28/16 1500  BP: (!) 158/78 (!) 144/86  Pulse: 94   Resp: 16   Temp: 98.3 F (36.8 C)   SpO2: 97%   Weight: 280 lb 4 oz (127.1 kg)   Height: 6\' 1"  (1.854 m)     Body mass index is 36.97 kg/m.  Nursing Note and Vital Signs reviewed.  Physical Exam  Constitutional: Patient appears well-developed and well-nourished. Obese No distress.  HEENT: head atraumatic, normocephalic, pupils equal and reactive to light, EOM's intact Cardiovascular: Normal rate, regular rhythm, S1/S2 present.  No murmur or rub heard. No BLE edema.   Although BP remains slightly elevated, pt expresses anxiety regarding situation today. Review of recent visits shows BP's consistently at or below goal: - 08/12/2016 - 138/72 - 06/25/2016 - 130/78 - 05/19/2016 - 132/74 Pulmonary/Chest: Effort normal and breath sounds clear. No respiratory distress or retractions. Abdominal: Soft and non-tender, bowel sounds present x4 quadrants. Psychiatric: Patient has a normal mood and affect. behavior is normal. Judgment and thought content normal.  Recent Results (from the past 2160 hour(s))  Hemoglobin A1c     Status: Abnormal   Collection Time: 08/12/16  8:45 AM  Result Value Ref Range   Hgb A1c MFr Bld 10.5 (H) 4.8 - 5.6 %    Comment:          Pre-diabetes: 5.7 - 6.4          Diabetes: >6.4          Glycemic control for adults with diabetes: <7.0    Est. average glucose Bld gHb Est-mCnc 255 mg/dL  Lipid panel     Status: None   Collection Time: 08/12/16  8:45 AM   Result Value Ref Range   Cholesterol, Total 154 100 - 199 mg/dL   Triglycerides 101 0 - 149 mg/dL   HDL 41 >39 mg/dL   VLDL Cholesterol Cal 20 5 - 40 mg/dL   LDL Calculated 93 0 - 99 mg/dL   Chol/HDL Ratio 3.8 0.0 - 5.0 ratio units    Comment:                                   T. Chol/HDL Ratio  Men  Women                               1/2 Avg.Risk  3.4    3.3                                   Avg.Risk  5.0    4.4                                2X Avg.Risk  9.6    7.1                                3X Avg.Risk 23.4   11.0   Comprehensive Metabolic Panel (CMET)     Status: Abnormal   Collection Time: 08/12/16  8:45 AM  Result Value Ref Range   Glucose 245 (H) 65 - 99 mg/dL   BUN 16 6 - 24 mg/dL   Creatinine, Ser 1.17 0.76 - 1.27 mg/dL   GFR calc non Af Amer 68 >59 mL/min/1.73   GFR calc Af Amer 78 >59 mL/min/1.73   BUN/Creatinine Ratio 14 9 - 20   Sodium 133 (L) 134 - 144 mmol/L   Potassium 4.6 3.5 - 5.2 mmol/L   Chloride 96 96 - 106 mmol/L   CO2 21 18 - 29 mmol/L   Calcium 9.7 8.7 - 10.2 mg/dL   Total Protein 7.3 6.0 - 8.5 g/dL   Albumin 4.2 3.5 - 5.5 g/dL   Globulin, Total 3.1 1.5 - 4.5 g/dL   Albumin/Globulin Ratio 1.4 1.2 - 2.2   Bilirubin Total 0.4 0.0 - 1.2 mg/dL   Alkaline Phosphatase 74 39 - 117 IU/L   AST 16 0 - 40 IU/L   ALT 11 0 - 44 IU/L  T4, free     Status: None   Collection Time: 08/12/16  8:45 AM  Result Value Ref Range   Free T4 1.20 0.82 - 1.77 ng/dL  TSH     Status: None   Collection Time: 08/12/16  8:45 AM  Result Value Ref Range   TSH 1.800 0.450 - 4.500 uIU/mL  T3, free     Status: None   Collection Time: 08/12/16  8:45 AM  Result Value Ref Range   T3, Free 3.4 2.0 - 4.4 pg/mL  HM DIABETES EYE EXAM     Status: None   Collection Time: 08/25/16 12:00 AM  Result Value Ref Range   HM Diabetic Eye Exam No Retinopathy No Retinopathy    Comment: Phineas Douglas, OD     Assessment & Plan  1. Benign  essential HTN  Attribute BP elevation to anxiety.  He is instructed to continue to monitor at home and continue medications as prescribed, he has follow up with Dr. Sanda Klein (PCP) on 11/02/2016, and we will consider medication changes if BP remains elevated at this time.  2. Ventral hernia without obstruction or gangrene Stable, follow up as needed.  Letter is drafted and paperwork is completed clearing patient for his job (this paperwork requires a BP reading <170/100).  -Red flags and when to present for emergency care or RTC including fever >101.75F, chest pain, shortness of breath, new/worsening/un-resolving symptoms, blurred vision, confusion, headache, weakness reviewed with patient at time of visit.  Follow up and care instructions discussed and provided in AVS.  ----------------------------- I have reviewed this encounter including the documentation in this note and/or discussed this patient with the Johney Maine, FNP, NP-C. I am certifying that I agree with the content of this note as supervising physician.  Enid Derry, [facility] 10/28/2016, 6:38 PM

## 2016-11-02 ENCOUNTER — Ambulatory Visit (INDEPENDENT_AMBULATORY_CARE_PROVIDER_SITE_OTHER): Payer: 59 | Admitting: Family Medicine

## 2016-11-02 ENCOUNTER — Encounter: Payer: Self-pay | Admitting: Family Medicine

## 2016-11-02 DIAGNOSIS — E782 Mixed hyperlipidemia: Secondary | ICD-10-CM | POA: Diagnosis not present

## 2016-11-02 DIAGNOSIS — I10 Essential (primary) hypertension: Secondary | ICD-10-CM | POA: Diagnosis not present

## 2016-11-02 DIAGNOSIS — Z5181 Encounter for therapeutic drug level monitoring: Secondary | ICD-10-CM | POA: Diagnosis not present

## 2016-11-02 DIAGNOSIS — E1165 Type 2 diabetes mellitus with hyperglycemia: Secondary | ICD-10-CM | POA: Diagnosis not present

## 2016-11-02 NOTE — Assessment & Plan Note (Signed)
Check labs 

## 2016-11-02 NOTE — Progress Notes (Signed)
BP 128/82   Pulse 85   Temp 97.8 F (36.6 C) (Oral)   Resp 16   Ht 6' (1.829 m)   Wt 277 lb 8 oz (125.9 kg)   SpO2 97%   BMI 37.64 kg/m    Subjective:    Patient ID: Marvin Bellis., male    DOB: [birth year], 61 y.o.   MRN: [id number]  HPI: Marvin Vallecillo. is a 61 y.o. male  Chief Complaint  Patient presents with  . Follow-up   HPI Patient is here for follow-up Type 2 diabetes; feet are okay; no blurred vision, no dry mouth; last A1c 10.5; thinks it will be better today High cholesterol; staying away from fatty meats for the most part; does his best Hypertension; controlled; does not eat processed foods Obesity; weight down six pounds in less than three months Last week, BP was up at job search, okay on recheck with Raelyn Ensign and okay here today GERD; still has a little bit, not bad; depends on what he eats  Depression screen [facility] 2/9 11/02/2016 08/12/2016 06/25/2016 05/13/2016 02/26/2016  Decreased Interest 0 0 0 0 0  Down, Depressed, Hopeless 0 0 0 0 0  PHQ - 2 Score 0 0 0 0 0   Relevant past medical, surgical, family and social history reviewed Past Medical History:  Diagnosis Date  . Decreased libido   . History of nuclear stress test    a. 05/2014: no evidence of significant ischemia, GI uptake was noted, EF 51%, no ECG changes concerning for ischemia, normal study  . HTN (hypertension)   . Hyperlipidemia   . Hypertension   . IDDM (insulin dependent diabetes mellitus) ([place])   . Obesity   . Pharyngitis   . Sleep apnea    uses CPAP   Past Surgical History:  Procedure Laterality Date  . AV FISTULA REPAIR    . CARDIAC CATHETERIZATION     [facility]   . COLONOSCOPY  2013   Family History  Problem Relation Age of Onset  . Breast cancer Mother   . Cancer Mother   . Kidney failure Father   . Diabetes Father   . Alcohol abuse Father   . Diabetes Sister    Social History   Social History  . Marital status: Married    Spouse name: N/A  . Number of  children: N/A  . Years of education: N/A   Occupational History  . Not on file.   Social History Main Topics  . Smoking status: Former Smoker    Packs/day: 0.25    Years: 25.00    Types: Cigarettes    Quit date: 05/17/1998  . Smokeless tobacco: Never Used     Comment: off and on - maybe 5 cigarettes  . Alcohol use No     Comment: previously but not currently  . Drug use: No  . Sexual activity: Yes   Other Topics Concern  . Not on file   Social History Narrative  . No narrative on file    Interim medical history since last visit reviewed. Allergies and medications reviewed  Review of Systems Per HPI unless specifically indicated above     Objective:    BP 128/82   Pulse 85   Temp 97.8 F (36.6 C) (Oral)   Resp 16   Ht 6' (1.829 m)   Wt 277 lb 8 oz (125.9 kg)   SpO2 97%   BMI 37.64 kg/m   Wt Readings from Last  3 Encounters:  11/02/16 277 lb 8 oz (125.9 kg)  10/28/16 280 lb 4 oz (127.1 kg)  08/12/16 283 lb 6.4 oz (128.5 kg)    Physical Exam  Constitutional: He appears well-developed and well-nourished. No distress.  HENT:  Head: Normocephalic and atraumatic.  Right Ear: Hearing and external ear normal.  Left Ear: Hearing and external ear normal.  Mouth/Throat: Oropharynx is clear and moist and mucous membranes are normal.  Eyes: EOM are normal. No scleral icterus.  Neck: No thyromegaly present.  Cardiovascular: Normal rate and regular rhythm.   Pulmonary/Chest: Effort normal and breath sounds normal.  Abdominal: Soft. Bowel sounds are normal. He exhibits no distension.  Musculoskeletal: He exhibits no edema.  Lymphadenopathy:    He has no cervical adenopathy.  Neurological: He is alert.  Skin: Skin is warm and dry. No pallor.  Psychiatric: He has a normal mood and affect. His behavior is normal. Judgment and thought content normal. His mood appears not anxious. He does not exhibit a depressed mood.   Diabetic Foot Form - Detailed   Diabetic Foot Exam -  detailed Diabetic Foot exam was performed with the following findings:  Yes 11/02/2016  8:25 AM  Visual Foot Exam completed.:  Yes  Can the patient see the bottom of their feet?:  Yes Are the shoes appropriate in style and fit?:  No Pulse Foot Exam completed.:  Yes  Right Dorsalis Pedis:  Present Left Dorsalis Pedis:  Present  Sensory Foot Exam Completed.:  Yes Semmes-Weinstein Monofilament Test R Site 1-Great Toe:  Pos L Site 1-Great Toe:  Pos        Results for orders placed or performed in visit on 09/13/16  HM DIABETES EYE EXAM  Result Value Ref Range   HM Diabetic Eye Exam No Retinopathy No Retinopathy      Assessment & Plan:   Problem List Items Addressed This Visit      Cardiovascular and Mediastinum   Benign essential HTN (Chronic)    Well-controlled; keep up the good job with weight loss; try to avoid salt and processed foods        Endocrine   Diabetes mellitus, type 2 (HCC) (Chronic)    Foot exam by MD today; check A1c; encouragement for avoidance of simple carbs      Relevant Orders   Hemoglobin A1c     Other   Medication monitoring encounter    Check labs      Relevant Orders   Comprehensive metabolic panel   Hyperlipidemia (Chronic)    Check lipids, avoid fatty meats      Relevant Orders   Lipid panel      Follow up plan: Return in about 3 months (around 02/02/2017) for twenty minute follow-up with fasting labs; if A1c under 7, then RTC in 6 months.  An after-visit summary was printed and given to the patient at [place].  Please see the patient instructions which may contain other information and recommendations beyond what is mentioned above in the assessment and plan.  No orders of the defined types were placed in this encounter.   Orders Placed This Encounter  Procedures  . Comprehensive metabolic panel  . Hemoglobin A1c  . Lipid panel

## 2016-11-02 NOTE — Assessment & Plan Note (Signed)
Check lipids, avoid fatty meats

## 2016-11-02 NOTE — Assessment & Plan Note (Signed)
Foot exam by MD today; check A1c; encouragement for avoidance of simple carbs

## 2016-11-02 NOTE — Assessment & Plan Note (Signed)
Well-controlled; keep up the good job with weight loss; try to avoid salt and processed foods

## 2016-11-02 NOTE — Patient Instructions (Signed)
Keep up the great job with weight loss Try to limit saturated fats in your diet (bologna, hot dogs, barbeque, cheeseburgers, hamburgers, steak, bacon, sausage, cheese, etc.) and get more fresh fruits, vegetables, and whole grains

## 2016-11-03 LAB — COMPREHENSIVE METABOLIC PANEL
A/G RATIO: 1.4 (ref 1.2–2.2)
ALBUMIN: 4.3 g/dL (ref 3.5–5.5)
ALT: 12 IU/L (ref 0–44)
AST: 21 IU/L (ref 0–40)
Alkaline Phosphatase: 76 IU/L (ref 39–117)
BILIRUBIN TOTAL: 0.4 mg/dL (ref 0.0–1.2)
BUN / CREAT RATIO: 12 (ref 9–20)
BUN: 15 mg/dL (ref 6–24)
CHLORIDE: 99 mmol/L (ref 96–106)
CO2: 20 mmol/L (ref 20–29)
Calcium: 9.8 mg/dL (ref 8.7–10.2)
Creatinine, Ser: 1.21 mg/dL (ref 0.76–1.27)
GFR calc non Af Amer: 65 mL/min/{1.73_m2} (ref >59)
GFR, EST AFRICAN AMERICAN: 75 mL/min/{1.73_m2} (ref >59)
Globulin, Total: 3.1 g/dL (ref 1.5–4.5)
Glucose: 280 mg/dL — ABNORMAL HIGH (ref 65–99)
POTASSIUM: 4.7 mmol/L (ref 3.5–5.2)
Sodium: 134 mmol/L (ref 134–144)
TOTAL PROTEIN: 7.4 g/dL (ref 6.0–8.5)

## 2016-11-03 LAB — HEMOGLOBIN A1C
ESTIMATED AVERAGE GLUCOSE: 258 mg/dL
Hgb A1c MFr Bld: 10.6 % — ABNORMAL HIGH (ref 4.8–5.6)

## 2016-11-03 LAB — LIPID PANEL
CHOLESTEROL TOTAL: 161 mg/dL (ref 100–199)
Chol/HDL Ratio: 4.1 ratio (ref 0.0–5.0)
HDL: 39 mg/dL — AB (ref >39)
LDL Calculated: 96 mg/dL (ref 0–99)
Triglycerides: 130 mg/dL (ref 0–149)
VLDL CHOLESTEROL CAL: 26 mg/dL (ref 5–40)

## 2016-11-07 ENCOUNTER — Other Ambulatory Visit: Payer: Self-pay | Admitting: Family Medicine

## 2016-11-07 MED ORDER — EMPAGLIFLOZIN 10 MG PO TABS: 10.0000 mg | ORAL_TABLET | Freq: Every day | ORAL | 5 refills | Status: DC

## 2016-11-07 NOTE — Progress Notes (Signed)
Add jardiance, pt to consider insulin vs endo referral and call me back

## 2016-12-18 DIAGNOSIS — S92301A Fracture of unspecified metatarsal bone(s), right foot, initial encounter for closed fracture: Secondary | ICD-10-CM | POA: Diagnosis not present

## 2016-12-25 DIAGNOSIS — S80811A Abrasion, right lower leg, initial encounter: Secondary | ICD-10-CM | POA: Diagnosis not present

## 2016-12-25 DIAGNOSIS — L03115 Cellulitis of right lower limb: Secondary | ICD-10-CM | POA: Diagnosis not present

## 2017-01-25 ENCOUNTER — Telehealth: Payer: Self-pay

## 2017-01-25 NOTE — Telephone Encounter (Signed)
Matheri, Case manager from [facility] states the pt has not taking his jardiance at all. Matheri states the pt was not aware that he need to take it and was confused. Looked over the pt chart and saw that Dr. lada sent his lab results plus instructions regarding the medicine in 10/2016.

## 2017-02-14 ENCOUNTER — Encounter: Payer: Self-pay | Admitting: Family Medicine

## 2017-02-14 ENCOUNTER — Ambulatory Visit (INDEPENDENT_AMBULATORY_CARE_PROVIDER_SITE_OTHER): Payer: 59 | Admitting: Family Medicine

## 2017-02-14 ENCOUNTER — Other Ambulatory Visit: Payer: Self-pay

## 2017-02-14 VITALS — BP 138/76 | HR 88 | Temp 97.8°F | Resp 16 | Ht 73.5 in | Wt 272.9 lb

## 2017-02-14 DIAGNOSIS — S92901A Unspecified fracture of right foot, initial encounter for closed fracture: Secondary | ICD-10-CM

## 2017-02-14 DIAGNOSIS — E1165 Type 2 diabetes mellitus with hyperglycemia: Secondary | ICD-10-CM

## 2017-02-14 DIAGNOSIS — I10 Essential (primary) hypertension: Secondary | ICD-10-CM

## 2017-02-14 DIAGNOSIS — Z6835 Body mass index (BMI) 35.0-35.9, adult: Secondary | ICD-10-CM

## 2017-02-14 DIAGNOSIS — E782 Mixed hyperlipidemia: Secondary | ICD-10-CM | POA: Diagnosis not present
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
## 2017-02-14 NOTE — Assessment & Plan Note (Signed)
Check A1c today; foot exam by MD 

## 2017-02-14 NOTE — Assessment & Plan Note (Signed)
Down five pounds, praise given

## 2017-02-14 NOTE — Patient Instructions (Addendum)
Your goal blood pressure is less than 130 mmHg on top. Try to follow the DASH guidelines (DASH stands for Dietary Approaches to Stop Hypertension) Try to limit the sodium in your diet.  Ideally, consume less than 1.5 grams (less than 1,500mg ) per day. Do not add salt when cooking or at the table.  Check the sodium amount on labels when shopping, and choose items lower in sodium when given a choice. Avoid or limit foods that already contain a lot of sodium. Eat a diet rich in fruits and vegetables and whole grains. Try to limit saturated fats in your diet (bologna, hot dogs, barbeque, cheeseburgers, hamburgers, steak, bacon, sausage, cheese, etc.) and get more fresh fruits, vegetables, and whole grains  DASH Eating Plan DASH stands for "Dietary Approaches to Stop Hypertension." The DASH eating plan is a healthy eating plan that has been shown to reduce high blood pressure (hypertension). It may also reduce your risk for type 2 diabetes, heart disease, and stroke. The DASH eating plan may also help with weight loss. What are tips for following this plan? General guidelines  Avoid eating more than 2,300 mg (milligrams) of salt (sodium) a day. If you have hypertension, you may need to reduce your sodium intake to 1,500 mg a day.  Limit alcohol intake to no more than 1 drink a day for nonpregnant women and 2 drinks a day for men. One drink equals 12 oz of beer, 5 oz of wine, or 1 oz of hard liquor.  Work with your health care provider to maintain a healthy body weight or to lose weight. Ask what an ideal weight is for you.  Get at least 30 minutes of exercise that causes your heart to beat faster (aerobic exercise) most days of the week. Activities may include walking, swimming, or biking.  Work with your health care provider or diet and nutrition specialist (dietitian) to adjust your eating plan to your individual calorie needs. Reading food labels  Check food labels for the amount of sodium per  serving. Choose foods with less than 5 percent of the Daily Value of sodium. Generally, foods with less than 300 mg of sodium per serving fit into this eating plan.  To find whole grains, look for the word "whole" as the first word in the ingredient list. Shopping  Buy products labeled as "low-sodium" or "no salt added."  Buy fresh foods. Avoid canned foods and premade or frozen meals. Cooking  Avoid adding salt when cooking. Use salt-free seasonings or herbs instead of table salt or sea salt. Check with your health care provider or pharmacist before using salt substitutes.  Do not fry foods. Cook foods using healthy methods such as baking, boiling, grilling, and broiling instead.  Cook with heart-healthy oils, such as olive, canola, soybean, or sunflower oil. Meal planning   Eat a balanced diet that includes: ? 5 or more servings of fruits and vegetables each day. At each meal, try to fill half of your plate with fruits and vegetables. ? Up to 6-8 servings of whole grains each day. ? Less than 6 oz of lean meat, poultry, or fish each day. A 3-oz serving of meat is about the same size as a deck of cards. One egg equals 1 oz. ? 2 servings of low-fat dairy each day. ? A serving of nuts, seeds, or beans 5 times each week. ? Heart-healthy fats. Healthy fats called Omega-3 fatty acids are found in foods such as flaxseeds and coldwater fish, like sardines,  salmon, and mackerel.  Limit how much you eat of the following: ? Canned or prepackaged foods. ? Food that is high in trans fat, such as fried foods. ? Food that is high in saturated fat, such as fatty meat. ? Sweets, desserts, sugary drinks, and other foods with added sugar. ? Full-fat dairy products.  Do not salt foods before eating.  Try to eat at least 2 vegetarian meals each week.  Eat more home-cooked food and less restaurant, buffet, and fast food.  When eating at a restaurant, ask that your food be prepared with less salt  or no salt, if possible. What foods are recommended? The items listed may not be a complete list. Talk with your dietitian about what dietary choices are best for you. Grains Whole-grain or whole-wheat bread. Whole-grain or whole-wheat pasta. Brayman rice. [place]. Bulgur. Whole-grain and low-sodium cereals. Pita bread. Low-fat, low-sodium crackers. Whole-wheat flour tortillas. Vegetables Fresh or frozen vegetables (raw, steamed, roasted, or grilled). Low-sodium or reduced-sodium tomato and vegetable juice. Low-sodium or reduced-sodium tomato sauce and tomato paste. Low-sodium or reduced-sodium canned vegetables. Fruits All fresh, dried, or frozen fruit. Canned fruit in natural juice (without added sugar). Meat and other protein foods Skinless chicken or [place]. Ground chicken or [place]. Pork with fat trimmed off. Fish and seafood. Egg whites. Dried beans, peas, or lentils. Unsalted nuts, nut butters, and seeds. Unsalted canned beans. Lean cuts of beef with fat trimmed off. Low-sodium, lean deli meat. Dairy Low-fat (1%) or fat-free (skim) milk. Fat-free, low-fat, or reduced-fat cheeses. Nonfat, low-sodium ricotta or cottage cheese. Low-fat or nonfat yogurt. Low-fat, low-sodium cheese. Fats and oils Soft margarine without trans fats. Vegetable oil. Low-fat, reduced-fat, or light mayonnaise and salad dressings (reduced-sodium). Canola, safflower, olive, soybean, and sunflower oils. Avocado. Seasoning and other foods Herbs. Spices. Seasoning mixes without salt. Unsalted popcorn and pretzels. Fat-free sweets. What foods are not recommended? The items listed may not be a complete list. Talk with your dietitian about what dietary choices are best for you. Grains Baked goods made with fat, such as croissants, muffins, or some breads. Dry pasta or rice meal packs. Vegetables Creamed or fried vegetables. Vegetables in a cheese sauce. Regular canned vegetables (not low-sodium or reduced-sodium).  Regular canned tomato sauce and paste (not low-sodium or reduced-sodium). Regular tomato and vegetable juice (not low-sodium or reduced-sodium). Angie Fava. Olives. Fruits Canned fruit in a light or heavy syrup. Fried fruit. Fruit in cream or butter sauce. Meat and other protein foods Fatty cuts of meat. Ribs. Fried meat. Berniece Salines. Sausage. Bologna and other processed lunch meats. Salami. Fatback. Hotdogs. Bratwurst. Salted nuts and seeds. Canned beans with added salt. Canned or smoked fish. Whole eggs or egg yolks. Chicken or [place] with skin. Dairy Whole or 2% milk, cream, and half-and-half. Whole or full-fat cream cheese. Whole-fat or sweetened yogurt. Full-fat cheese. Nondairy creamers. Whipped toppings. Processed cheese and cheese spreads. Fats and oils Butter. Stick margarine. Lard. Shortening. Ghee. Bacon fat. Tropical oils, such as coconut, palm kernel, or palm oil. Seasoning and other foods Salted popcorn and pretzels. Onion salt, garlic salt, seasoned salt, table salt, and sea salt. Worcestershire sauce. Tartar sauce. Barbecue sauce. Teriyaki sauce. Soy sauce, including reduced-sodium. Steak sauce. Canned and packaged gravies. Fish sauce. Oyster sauce. Cocktail sauce. Horseradish that you find on the shelf. Ketchup. Mustard. Meat flavorings and tenderizers. Bouillon cubes. Hot sauce and Tabasco sauce. Premade or packaged marinades. Premade or packaged taco seasonings. Relishes. Regular salad dressings. Where to find more information:  [employer]  Heart, Lung, and Blood Institute: https://wilson-eaton.com/  American Heart Association: www.heart.org Summary  The DASH eating plan is a healthy eating plan that has been shown to reduce high blood pressure (hypertension). It may also reduce your risk for type 2 diabetes, heart disease, and stroke.  With the DASH eating plan, you should limit salt (sodium) intake to 2,300 mg a day. If you have hypertension, you may need to reduce your sodium intake to 1,500 mg  a day.  When on the DASH eating plan, aim to eat more fresh fruits and vegetables, whole grains, lean proteins, low-fat dairy, and heart-healthy fats.  Work with your health care provider or diet and nutrition specialist (dietitian) to adjust your eating plan to your individual calorie needs. This information is not intended to replace advice given to you by your health care provider. Make sure you discuss any questions you have with your health care provider. Document Released: 04/22/2011 Document Revised: 04/26/2016 Document Reviewed: 04/26/2016 Elsevier Interactive Patient Education  2017 [employer].

## 2017-02-14 NOTE — Assessment & Plan Note (Signed)
Stable; will not check today; draw fasting lipids at next appt; limit saturated fats

## 2017-02-14 NOTE — Assessment & Plan Note (Signed)
Goal systolic less than 916, close, did not take medicine and stressors likely increased; will not adjust med; try to follow DASH guidelines

## 2017-02-15 ENCOUNTER — Other Ambulatory Visit: Payer: Self-pay | Admitting: Family Medicine

## 2017-02-15 LAB — BASIC METABOLIC PANEL
BUN/Creatinine Ratio: 13 (ref 10–24)
BUN: 17 mg/dL (ref 8–27)
CALCIUM: 9.6 mg/dL (ref 8.6–10.2)
CO2: 20 mmol/L (ref 20–29)
CREATININE: 1.29 mg/dL — AB (ref 0.76–1.27)
Chloride: 102 mmol/L (ref 96–106)
GFR calc Af Amer: 69 mL/min/{1.73_m2} (ref >59)
GFR, EST NON AFRICAN AMERICAN: 60 mL/min/{1.73_m2} (ref >59)
Glucose: 126 mg/dL — ABNORMAL HIGH (ref 65–99)
Potassium: 4.5 mmol/L (ref 3.5–5.2)
Sodium: 138 mmol/L (ref 134–144)

## 2017-02-15 LAB — HEMOGLOBIN A1C
Est. average glucose Bld gHb Est-mCnc: 171 mg/dL
HEMOGLOBIN A1C: 7.6 % — AB (ref 4.8–5.6)

## 2017-02-24 DIAGNOSIS — S93401A Sprain of unspecified ligament of right ankle, initial encounter: Secondary | ICD-10-CM | POA: Diagnosis not present

## 2017-02-24 DIAGNOSIS — S93409A Sprain of unspecified ligament of unspecified ankle, initial encounter: Secondary | ICD-10-CM | POA: Insufficient documentation

## 2017-03-04 DIAGNOSIS — M25571 Pain in right ankle and joints of right foot: Secondary | ICD-10-CM | POA: Diagnosis not present

## 2017-03-04 DIAGNOSIS — M25879 Other specified joint disorders, unspecified ankle and foot: Secondary | ICD-10-CM | POA: Diagnosis not present

## 2017-03-09 DIAGNOSIS — M25571 Pain in right ankle and joints of right foot: Secondary | ICD-10-CM | POA: Diagnosis not present

## 2017-03-09 DIAGNOSIS — M25879 Other specified joint disorders, unspecified ankle and foot: Secondary | ICD-10-CM | POA: Diagnosis not present

## 2017-03-18 ENCOUNTER — Other Ambulatory Visit: Payer: Self-pay | Admitting: Family Medicine

## 2017-03-21 ENCOUNTER — Ambulatory Visit (INDEPENDENT_AMBULATORY_CARE_PROVIDER_SITE_OTHER): Payer: 59 | Admitting: Family Medicine

## 2017-03-21 ENCOUNTER — Encounter: Payer: Self-pay | Admitting: Family Medicine

## 2017-03-21 ENCOUNTER — Other Ambulatory Visit: Payer: Self-pay | Admitting: Family Medicine

## 2017-03-21 VITALS — BP 138/78 | HR 80 | Temp 97.7°F | Resp 16 | Ht 74.0 in | Wt 280.9 lb

## 2017-03-21 DIAGNOSIS — Z125 Encounter for screening for malignant neoplasm of prostate: Secondary | ICD-10-CM | POA: Diagnosis not present

## 2017-03-21 DIAGNOSIS — Z1211 Encounter for screening for malignant neoplasm of colon: Secondary | ICD-10-CM | POA: Diagnosis not present

## 2017-03-21 DIAGNOSIS — Z0000 Encounter for general adult medical examination without abnormal findings: Secondary | ICD-10-CM | POA: Diagnosis not present

## 2017-03-21 DIAGNOSIS — M25879 Other specified joint disorders, unspecified ankle and foot: Secondary | ICD-10-CM | POA: Diagnosis not present

## 2017-03-21 DIAGNOSIS — M25571 Pain in right ankle and joints of right foot: Secondary | ICD-10-CM | POA: Diagnosis not present

## 2017-03-21 MED ORDER — DAPAGLIFLOZIN PROPANEDIOL 5 MG PO TABS: 5.0000 mg | ORAL_TABLET | Freq: Every day | ORAL | 11 refills | Status: DC

## 2017-03-21 NOTE — Progress Notes (Signed)
Name: Marvin Duncan.   MRN: [id number]    DOB: 05/27/1955   Date:03/21/2017       Progress Note  Subjective  Chief Complaint  Chief Complaint  Patient presents with  . Annual Exam    HPI  Pt. presents for complete physical exam.  His last colonoscopy was in 2011, will order Cologuard, he has no FHx of colon CA. He is due for prostate cancer screening.    Past Medical History:  Diagnosis Date  . Decreased libido   . History of nuclear stress test    a. 05/2014: no evidence of significant ischemia, GI uptake was noted, EF 51%, no ECG changes concerning for ischemia, normal study  . HTN (hypertension)   . Hyperlipidemia   . Hypertension   . IDDM (insulin dependent diabetes mellitus) ([place])   . Obesity   . Pharyngitis   . Sleep apnea    uses CPAP    Past Surgical History:  Procedure Laterality Date  . AV FISTULA REPAIR    . CARDIAC CATHETERIZATION     [facility]   . COLONOSCOPY  2013    Family History  Problem Relation Age of Onset  . Breast cancer Mother   . Cancer Mother   . Kidney failure Father   . Diabetes Father   . Alcohol abuse Father   . Diabetes Sister     Social History   Socioeconomic History  . Marital status: Married    Spouse name: Not on file  . Number of children: Not on file  . Years of education: Not on file  . Highest education level: Not on file  Social Needs  . Financial resource strain: Not on file  . Food insecurity - worry: Not on file  . Food insecurity - inability: Not on file  . Transportation needs - medical: Not on file  . Transportation needs - non-medical: Not on file  Occupational History  . Not on file  Tobacco Use  . Smoking status: Former Smoker    Packs/day: 0.25    Years: 25.00    Pack years: 6.25    Types: Cigarettes    Last attempt to quit: 05/17/1998    Years since quitting: 18.8  . Smokeless tobacco: Never Used  . Tobacco comment: off and on - maybe 5 cigarettes  Substance and Sexual Activity  . Alcohol use:  No    Alcohol/week: 0.0 oz    Comment: previously but not currently  . Drug use: No  . Sexual activity: Yes  Other Topics Concern  . Not on file  Social History Narrative  . Not on file     Current Outpatient Medications:  .  amLODipine (NORVASC) 10 MG tablet, TAKE 1 TABLET BY MOUTH  DAILY, Disp: 90 tablet, Rfl: 3 .  aspirin 81 MG tablet, Take 81 mg by mouth daily., Disp: , Rfl:  .  Cholecalciferol (VITAMIN D) 2000 UNITS tablet, Take 2,000 Units by mouth daily., Disp: , Rfl:  .  Dulaglutide (TRULICITY) 1.5 FM/3.8GY SOPN, Inject contents of one syringe subcutaneously once a week, Disp: 12 pen, Rfl: 3 .  empagliflozin (JARDIANCE) 10 MG TABS tablet, Take 10 mg by mouth daily. (for diabetes), Disp: 30 tablet, Rfl: 5 .  Flaxseed, Linseed, (FLAXSEED OIL) 1000 MG CAPS, Take 1,000 mg by mouth daily. , Disp: , Rfl:  .  fluticasone (FLONASE) 50 MCG/ACT nasal spray, Place 2 sprays into both nostrils daily. (if needed for allergy symptoms), Disp: 48 g, Rfl: 3 .  glipiZIDE (GLUCOTROL) 10 MG tablet, TAKE 1 TABLET BY MOUTH 2  TIMES DAILY BEFORE A MEAL., Disp: 180 tablet, Rfl: 1 .  Insulin Pen Needle 32G X 6 MM MISC, 1 Package by Does not apply route once., Disp: 100 each, Rfl: 3 .  losartan (COZAAR) 100 MG tablet, TAKE 1 TABLET BY MOUTH  DAILY, Disp: 90 tablet, Rfl: 3 .  meloxicam (MOBIC) 15 MG tablet, Take 1 tablet daily by mouth., Disp: , Rfl:  .  metFORMIN (GLUCOPHAGE) 1000 MG tablet, TAKE 1 TABLET BY MOUTH TWO  TIMES DAILY WITH MEALS, Disp: 180 tablet, Rfl: 1 .  Omega-3 Fatty Acids (FISH OIL) 1000 MG CAPS, Take 1,000 mg by mouth daily., Disp: , Rfl:  .  pravastatin (PRAVACHOL) 40 MG tablet, Take 1 tablet (40 mg total) by mouth at bedtime., Disp: 90 tablet, Rfl: 3 .  sildenafil (REVATIO) 20 MG tablet, Two to five pills (40 to 100 mg) by mouth prior to intimacy once a day; five pills equals one 20 mg pill of cialis, Disp: 50 tablet, Rfl: 5 .  traZODone (DESYREL) 100 MG tablet, TAKE 1 TABLET BY MOUTH  AT  BEDTIME AS NEEDED FOR SLEEP, Disp: 90 tablet, Rfl: 3 .  vitamin B-12 (CYANOCOBALAMIN) 1000 MCG tablet, Take 1,000 mcg by mouth daily., Disp: , Rfl:   Allergies  Allergen Reactions  . Atorvastatin Other (See Comments)    Statins cause headaches and muscle aches     Review of Systems  Constitutional: Negative for chills, fever, malaise/fatigue and weight loss.  HENT: Positive for sinus pain. Negative for congestion and ear pain.   Eyes: Negative for blurred vision and double vision.  Respiratory: Positive for sputum production. Negative for cough, shortness of breath and wheezing.   Cardiovascular: Negative for chest pain, palpitations, orthopnea and leg swelling.  Gastrointestinal: Positive for constipation. Negative for abdominal pain, blood in stool, diarrhea, nausea and vomiting.  Genitourinary: Negative for dysuria and hematuria.  Musculoskeletal: Negative for back pain and neck pain.  Neurological: Negative for dizziness and headaches.  Psychiatric/Behavioral: Negative for depression. The patient is not nervous/anxious and does not have insomnia.     Objective  Vitals:   03/21/17 1435  BP: 138/78  Pulse: 80  Resp: 16  Temp: 97.7 F (36.5 C)  TempSrc: Oral  SpO2: 97%  Weight: 280 lb 14.4 oz (127.4 kg)  Height: 6\' 2"  (1.88 m)    Physical Exam  Constitutional: He is oriented to person, place, and time and well-developed, well-nourished, and in no distress.  HENT:  Head: Normocephalic and atraumatic.  Right Ear: External ear normal.  Left Ear: External ear normal.  Mouth/Throat: Oropharynx is clear and moist.  Cardiovascular: Normal rate, regular rhythm and normal heart sounds.  No murmur heard. Pulmonary/Chest: Effort normal and breath sounds normal. He has no wheezes.  Abdominal: Soft. Bowel sounds are normal. There is no tenderness.  Genitourinary: Prostate normal.  Neurological: He is alert and oriented to person, place, and time.  Skin: Skin is warm, dry  and intact.  Psychiatric: Mood, memory, affect and judgment normal.  Nursing note and vitals reviewed.      Assessment & Plan  1. Annual physical exam  - CBC with Differential/Platelet - VITAMIN D 25 Hydroxy (Vit-D Deficiency, Fractures)  2. Screening for colon cancer  - Cologuard  3. Screening for prostate cancer  - PSA   Dael Howland [facility] 03/21/2017 3:07 PM

## 2017-03-21 NOTE — Progress Notes (Signed)
Jardiance not covered by insurance Switch to [employer]

## 2017-03-22 DIAGNOSIS — Z125 Encounter for screening for malignant neoplasm of prostate: Secondary | ICD-10-CM | POA: Diagnosis not present

## 2017-03-22 DIAGNOSIS — Z0000 Encounter for general adult medical examination without abnormal findings: Secondary | ICD-10-CM | POA: Diagnosis not present

## 2017-03-23 LAB — CBC WITH DIFFERENTIAL/PLATELET
Basophils Absolute: 0 10*3/uL (ref 0.0–0.2)
Basos: 0 %
EOS (ABSOLUTE): 0.1 10*3/uL (ref 0.0–0.4)
EOS: 2 %
HEMATOCRIT: 44.3 % (ref 37.5–51.0)
HEMOGLOBIN: 14.2 g/dL (ref 13.0–17.7)
Immature Grans (Abs): 0 10*3/uL (ref 0.0–0.1)
Immature Granulocytes: 0 %
LYMPHS ABS: 2.6 10*3/uL (ref 0.7–3.1)
Lymphs: 42 %
MCH: 28.5 pg (ref 26.6–33.0)
MCHC: 32.1 g/dL (ref 31.5–35.7)
MCV: 89 fL (ref 79–97)
MONOCYTES: 6 %
MONOS ABS: 0.3 10*3/uL (ref 0.1–0.9)
NEUTROS ABS: 3.1 10*3/uL (ref 1.4–7.0)
Neutrophils: 50 %
Platelets: 268 10*3/uL (ref 150–379)
RBC: 4.99 x10E6/uL (ref 4.14–5.80)
RDW: 12.8 % (ref 12.3–15.4)
WBC: 6.2 10*3/uL (ref 3.4–10.8)

## 2017-03-23 LAB — PSA: Prostate Specific Ag, Serum: 0.9 ng/mL (ref 0.0–4.0)

## 2017-03-23 LAB — VITAMIN D 25 HYDROXY (VIT D DEFICIENCY, FRACTURES): Vit D, 25-Hydroxy: 29.7 ng/mL — ABNORMAL LOW (ref 30.0–100.0)

## 2017-03-23 LAB — SPECIMEN STATUS REPORT

## 2017-03-24 ENCOUNTER — Other Ambulatory Visit: Payer: Self-pay | Admitting: Family Medicine

## 2017-03-29 ENCOUNTER — Other Ambulatory Visit: Payer: Self-pay | Admitting: Family Medicine

## 2017-03-30 ENCOUNTER — Telehealth: Payer: Self-pay | Admitting: Family Medicine

## 2017-03-30 NOTE — Telephone Encounter (Signed)
Patient is calling to let the office know that he needs a refill of his Glipizide. He also states that the Rx for Vania Rea is on hold and he needs that as well.(it looks like there may be an insurance issue with that- not sure)  He uses the [facility] location. He can be reached for questions at [phone].

## 2017-03-30 NOTE — Telephone Encounter (Signed)
Left detailed voicemail that the Jardiance was not covered by his ins and she gave him farxiga to replace that. Also it looks like she sent in refills yesterday due to rx request we received from mail order [facility].  The glipizide was sent to them if he does not want to use them then let us know and we can resend to [facility].

## 2017-03-30 NOTE — Telephone Encounter (Signed)
Attempted to each pt via phone; left message to call Dr Delight Ovens office ASAP

## 2017-03-31 ENCOUNTER — Telehealth: Payer: Self-pay

## 2017-03-31 MED ORDER — EMPAGLIFLOZIN 10 MG PO TABS: 10.0000 mg | ORAL_TABLET | Freq: Every day | ORAL | 6 refills | Status: DC

## 2017-03-31 NOTE — Telephone Encounter (Signed)
Pt is now stating that the farxiga is not being covered by ins and got a letter from [facility] that jardiance would be approved.  Please resend jardiance to [facility]

## 2017-04-12 ENCOUNTER — Other Ambulatory Visit: Payer: Self-pay

## 2017-04-12 MED ORDER — EMPAGLIFLOZIN 10 MG PO TABS: 10.0000 mg | ORAL_TABLET | Freq: Every day | ORAL | 3 refills | Status: DC

## 2017-04-12 NOTE — Telephone Encounter (Signed)
Insurance requesting 90 days supply.

## 2017-05-18 ENCOUNTER — Encounter: Payer: Self-pay | Admitting: Family Medicine

## 2017-05-18 ENCOUNTER — Ambulatory Visit: Payer: 59 | Admitting: Family Medicine

## 2017-05-18 VITALS — BP 136/72 | HR 82 | Temp 98.1°F | Ht 73.5 in | Wt 281.5 lb

## 2017-05-18 DIAGNOSIS — G4739 Other sleep apnea: Secondary | ICD-10-CM | POA: Diagnosis not present

## 2017-05-18 DIAGNOSIS — I10 Essential (primary) hypertension: Secondary | ICD-10-CM | POA: Diagnosis not present

## 2017-05-18 DIAGNOSIS — Z6835 Body mass index (BMI) 35.0-35.9, adult: Secondary | ICD-10-CM | POA: Diagnosis not present

## 2017-05-18 DIAGNOSIS — J0100 Acute maxillary sinusitis, unspecified: Secondary | ICD-10-CM | POA: Diagnosis not present

## 2017-05-18 DIAGNOSIS — E1165 Type 2 diabetes mellitus with hyperglycemia: Secondary | ICD-10-CM | POA: Diagnosis not present

## 2017-05-18 DIAGNOSIS — E66812 Obesity, class 2: Secondary | ICD-10-CM

## 2017-05-18 DIAGNOSIS — Z5181 Encounter for therapeutic drug level monitoring: Secondary | ICD-10-CM

## 2017-05-18 MED ORDER — AMOXICILLIN-POT CLAVULANATE 875-125 MG PO TABS: 1.0000 | ORAL_TABLET | Freq: Two times a day (BID) | ORAL | 0 refills | Status: AC

## 2017-05-18 NOTE — Assessment & Plan Note (Signed)
Working on this

## 2017-05-18 NOTE — Progress Notes (Signed)
BP 136/72 (BP Location: Right Arm, Patient Position: Sitting, Cuff Size: Large)   Pulse 82   Temp 98.1 F (36.7 C) (Oral)   Ht 6' 1.5" (1.867 m)   Wt 281 lb 8 oz (127.7 kg)   SpO2 98%   BMI 36.64 kg/m    Subjective:    Patient ID: Marvin Duncan., male    DOB: 08-17-1955, 62 y.o.   MRN: [id number]  HPI: Marvin Duncan. is a 62 y.o. male  Chief Complaint  Patient presents with  . Follow-up    HPI Sinus problems for 3 weeks; congestion; some cough; not really any fevers; tried theraflu; had sinus infections before, thinks this is one  Type 2 diabetes; on meds; tough time of year; does not desire additional education; diet has changed immensely; foot exam, some pain, comes and goes, will see podiatrist, went to [facility]; will use inserts, open invitation in the chart for referral; really dropping A1c from 11.3 one year ago to 7.6 last check  High cholesterol; tries to avoid fatty meats; tolerating statin  HTN; controlled today; not much salt  Vit D deficiency; gets time outdoors; not really tired unless not getting enough sleep; wearing machine CPAP  Depression screen [facility] 2/9 05/18/2017 03/21/2017 02/14/2017 11/02/2016 08/12/2016  Decreased Interest 0 0 0 0 0  Down, Depressed, Hopeless 0 0 0 0 0  PHQ - 2 Score 0 0 0 0 0    Relevant past medical, surgical, family and social history reviewed Past Medical History:  Diagnosis Date  . Decreased libido   . History of nuclear stress test    a. 05/2014: no evidence of significant ischemia, GI uptake was noted, EF 51%, no ECG changes concerning for ischemia, normal study  . HTN (hypertension)   . Hyperlipidemia   . Hypertension   . IDDM (insulin dependent diabetes mellitus) ([place])   . Obesity   . Pharyngitis   . Sleep apnea    uses CPAP   Past Surgical History:  Procedure Laterality Date  . AV FISTULA REPAIR    . CARDIAC CATHETERIZATION     [facility]   . COLONOSCOPY  2013   Family History  Problem Relation Age of Onset    . Breast cancer Mother   . Cancer Mother   . Kidney failure Father   . Diabetes Father   . Alcohol abuse Father   . Diabetes Sister    Social History   Tobacco Use  . Smoking status: Former Smoker    Packs/day: 0.25    Years: 25.00    Pack years: 6.25    Types: Cigarettes    Last attempt to quit: 05/17/1998    Years since quitting: 19.0  . Smokeless tobacco: Never Used  . Tobacco comment: off and on - maybe 5 cigarettes  Substance Use Topics  . Alcohol use: No    Alcohol/week: 0.0 oz    Comment: previously but not currently  . Drug use: No    Interim medical history since last visit reviewed. Allergies and medications reviewed  Review of Systems Per HPI unless specifically indicated above     Objective:    BP 136/72 (BP Location: Right Arm, Patient Position: Sitting, Cuff Size: Large)   Pulse 82   Temp 98.1 F (36.7 C) (Oral)   Ht 6' 1.5" (1.867 m)   Wt 281 lb 8 oz (127.7 kg)   SpO2 98%   BMI 36.64 kg/m   Wt Readings from Last  3 Encounters:  05/18/17 281 lb 8 oz (127.7 kg)  03/21/17 280 lb 14.4 oz (127.4 kg)  02/14/17 272 lb 14.4 oz (123.8 kg)    Physical Exam  Constitutional: He appears well-developed and well-nourished. No distress.  HENT:  Head: Normocephalic and atraumatic.  Right Ear: Hearing, tympanic membrane, external ear and ear canal normal.  Left Ear: Hearing, tympanic membrane, external ear and ear canal normal.  Nose: Mucosal edema and rhinorrhea present.  Mouth/Throat: Oropharynx is clear and moist and mucous membranes are normal. No posterior oropharyngeal edema or posterior oropharyngeal erythema.  Eyes: EOM are normal. No scleral icterus.  Neck: No JVD present.  Cardiovascular: Normal rate and regular rhythm.  Pulmonary/Chest: Effort normal and breath sounds normal.  Abdominal: Soft. Bowel sounds are normal. He exhibits no distension.  Musculoskeletal: He exhibits no edema.       Right foot: There is no swelling and no deformity.        Left foot: There is no swelling and no deformity.       Feet:  Neurological: He is alert.  Skin: Skin is warm and dry. No pallor.  Few scabs on the dorsum of the left foot consistent with healing insect bites; no fluctuance, no erythema, no proximal erythema  Psychiatric: He has a normal mood and affect. Thought content normal. His mood appears not anxious. He does not exhibit a depressed mood.    Results for orders placed or performed in visit on 03/21/17  CBC with Differential/Platelet  Result Value Ref Range   WBC 6.2 3.4 - 10.8 x10E3/uL   RBC 4.99 4.14 - 5.80 x10E6/uL   Hemoglobin 14.2 13.0 - 17.7 g/dL   Hematocrit 44.3 37.5 - 51.0 %   MCV 89 79 - 97 fL   MCH 28.5 26.6 - 33.0 pg   MCHC 32.1 31.5 - 35.7 g/dL   RDW 12.8 12.3 - 15.4 %   Platelets 268 150 - 379 x10E3/uL   Neutrophils 50 Not Estab. %   Lymphs 42 Not Estab. %   Monocytes 6 Not Estab. %   Eos 2 Not Estab. %   Basos 0 Not Estab. %   Neutrophils Absolute 3.1 1.4 - 7.0 x10E3/uL   Lymphocytes Absolute 2.6 0.7 - 3.1 x10E3/uL   Monocytes Absolute 0.3 0.1 - 0.9 x10E3/uL   EOS (ABSOLUTE) 0.1 0.0 - 0.4 x10E3/uL   Basophils Absolute 0.0 0.0 - 0.2 x10E3/uL   Immature Granulocytes 0 Not Estab. %   Immature Grans (Abs) 0.0 0.0 - 0.1 x10E3/uL  VITAMIN D 25 Hydroxy (Vit-D Deficiency, Fractures)  Result Value Ref Range   Vit D, 25-Hydroxy 29.7 (L) 30.0 - 100.0 ng/mL  PSA  Result Value Ref Range   Prostate Specific Ag, Serum 0.9 0.0 - 4.0 ng/mL  Specimen status report  Result Value Ref Range   specimen status report Comment       Assessment & Plan:   Problem List Items Addressed This Visit      Cardiovascular and Mediastinum   Benign essential HTN (Chronic)    Limit salt; check Cr        Respiratory   Sleep apnea (Chronic)    Wearing CPAP; need to treat the sinus infection        Endocrine   Diabetes mellitus, type 2 (HCC) (Chronic)    Big improvements, check A1c; foot exam today; eye exam UTD       Relevant Orders   Hemoglobin A1c   Lipid panel  Other   Obesity (Chronic)    Working on this      Medication monitoring encounter    Monitor liver and kidneys      Relevant Orders   Comprehensive metabolic panel    Other Visit Diagnoses    Acute non-recurrent maxillary sinusitis    -  Primary   treat with antibiotics; cautioned about C diff; see AVS   Relevant Medications   amoxicillin-clavulanate (AUGMENTIN) 875-125 MG tablet       Follow up plan: Return in about 3 months (around 08/16/2017).  An after-visit summary was printed and given to the patient at [place].  Please see the patient instructions which may contain other information and recommendations beyond what is mentioned above in the assessment and plan.  Meds ordered this encounter  Medications  . amoxicillin-clavulanate (AUGMENTIN) 875-125 MG tablet    Sig: Take 1 tablet by mouth 2 (two) times daily for 10 days.    Dispense:  20 tablet    Refill:  0    Orders Placed This Encounter  Procedures  . Comprehensive metabolic panel  . Hemoglobin A1c  . Lipid panel

## 2017-05-18 NOTE — Assessment & Plan Note (Signed)
Monitor liver and kidneys 

## 2017-05-18 NOTE — Assessment & Plan Note (Signed)
Big improvements, check A1c; foot exam today; eye exam UTD

## 2017-05-18 NOTE — Assessment & Plan Note (Signed)
Limit salt; check Cr

## 2017-05-18 NOTE — Patient Instructions (Addendum)
Please do see your eye doctor regularly, and have your eyes examined every year (or more often per his or her recommendation) Check your feet every night and let me know right away of any sores, infections, numbness, etc. Try to limit sweets, white bread, white rice, white potatoes It is okay with me for you to not check your fingerstick blood sugars (per [employer] of Endocrinology Best Practices), unless you are interested and feel it would be helpful for you Please do eat yogurt or kimchi or take a probiotic daily for the next month We want to replace the healthy germs in the gut If you notice foul, watery diarrhea in the next two months, schedule an appointment RIGHT AWAY or go to an urgent care or the emergency room if a holiday or over a weekend

## 2017-05-18 NOTE — Assessment & Plan Note (Signed)
Wearing CPAP; need to treat the sinus infection

## 2017-05-19 ENCOUNTER — Other Ambulatory Visit: Payer: Self-pay | Admitting: Family Medicine

## 2017-05-19 ENCOUNTER — Telehealth: Payer: Self-pay

## 2017-05-19 LAB — COMPREHENSIVE METABOLIC PANEL
ALBUMIN: 4.5 g/dL (ref 3.6–4.8)
ALT: 10 IU/L (ref 0–44)
AST: 18 IU/L (ref 0–40)
Albumin/Globulin Ratio: 1.6 (ref 1.2–2.2)
Alkaline Phosphatase: 72 IU/L (ref 39–117)
BUN / CREAT RATIO: 13 (ref 10–24)
BUN: 16 mg/dL (ref 8–27)
Bilirubin Total: 0.4 mg/dL (ref 0.0–1.2)
CO2: 19 mmol/L — AB (ref 20–29)
CREATININE: 1.24 mg/dL (ref 0.76–1.27)
Calcium: 9.8 mg/dL (ref 8.6–10.2)
Chloride: 103 mmol/L (ref 96–106)
GFR calc Af Amer: 73 mL/min/{1.73_m2} (ref >59)
GFR, EST NON AFRICAN AMERICAN: 63 mL/min/{1.73_m2} (ref >59)
GLUCOSE: 126 mg/dL — AB (ref 65–99)
Globulin, Total: 2.9 g/dL (ref 1.5–4.5)
Potassium: 4.6 mmol/L (ref 3.5–5.2)
SODIUM: 137 mmol/L (ref 134–144)
TOTAL PROTEIN: 7.4 g/dL (ref 6.0–8.5)

## 2017-05-19 LAB — LIPID PANEL
CHOL/HDL RATIO: 3.1 ratio (ref 0.0–5.0)
Cholesterol, Total: 129 mg/dL (ref 100–199)
HDL: 42 mg/dL (ref >39)
LDL CALC: 69 mg/dL (ref 0–99)
TRIGLYCERIDES: 91 mg/dL (ref 0–149)
VLDL CHOLESTEROL CAL: 18 mg/dL (ref 5–40)

## 2017-05-19 LAB — HEMOGLOBIN A1C
Est. average glucose Bld gHb Est-mCnc: 189 mg/dL
Hgb A1c MFr Bld: 8.2 % — ABNORMAL HIGH (ref 4.8–5.6)

## 2017-05-19 MED ORDER — EMPAGLIFLOZIN 25 MG PO TABS: 25.0000 mg | ORAL_TABLET | Freq: Every day | ORAL | 5 refills | Status: DC

## 2017-05-19 NOTE — Progress Notes (Signed)
Increase dose of SGLT-2 inhibitor

## 2017-05-19 NOTE — Telephone Encounter (Signed)
Called pt informed him of lab results, pt gave verbal understanding.  

## 2017-05-19 NOTE — Telephone Encounter (Signed)
-----   Message from Arnetha Courser, MD sent at 05/19/2017  1:35 PM EST ----- Please let pt know that his kidney function has improved since the last check; 3 month blood sugar average did go up, though; it's 8.2 and our goal is less than 7; increase Jardiance from 10 mg daily to 25 mg daily (new Rx sent); really work on weight loss and we'll see him back in 3 months to recheck this; cholesterol has improved since last check, so that's good news; thank you

## 2017-05-31 ENCOUNTER — Other Ambulatory Visit: Payer: Self-pay | Admitting: Family Medicine

## 2017-05-31 NOTE — Telephone Encounter (Signed)
Last SGPT and lipids reviewed; Rxs approved

## 2017-06-30 NOTE — Progress Notes (Signed)
Closing abstraction note

## 2017-06-30 NOTE — Progress Notes (Signed)
Closing out lab/order note open since:  02/14/17

## 2017-08-12 ENCOUNTER — Other Ambulatory Visit: Payer: Self-pay | Admitting: Family Medicine

## 2017-09-21 ENCOUNTER — Other Ambulatory Visit: Payer: Self-pay | Admitting: Family Medicine

## 2017-09-22 ENCOUNTER — Encounter: Payer: Self-pay | Admitting: Family Medicine

## 2017-09-22 ENCOUNTER — Ambulatory Visit: Payer: 59 | Admitting: Family Medicine

## 2017-09-22 VITALS — BP 118/76 | HR 80 | Temp 98.2°F | Resp 14 | Ht 74.0 in | Wt 274.4 lb

## 2017-09-22 DIAGNOSIS — Z5181 Encounter for therapeutic drug level monitoring: Secondary | ICD-10-CM | POA: Diagnosis not present

## 2017-09-22 DIAGNOSIS — E782 Mixed hyperlipidemia: Secondary | ICD-10-CM | POA: Diagnosis not present

## 2017-09-22 DIAGNOSIS — Z125 Encounter for screening for malignant neoplasm of prostate: Secondary | ICD-10-CM | POA: Diagnosis not present

## 2017-09-22 DIAGNOSIS — N5201 Erectile dysfunction due to arterial insufficiency: Secondary | ICD-10-CM

## 2017-09-22 DIAGNOSIS — E1140 Type 2 diabetes mellitus with diabetic neuropathy, unspecified: Secondary | ICD-10-CM

## 2017-09-22 DIAGNOSIS — E1165 Type 2 diabetes mellitus with hyperglycemia: Secondary | ICD-10-CM | POA: Diagnosis not present

## 2017-09-22 DIAGNOSIS — I10 Essential (primary) hypertension: Secondary | ICD-10-CM | POA: Diagnosis not present

## 2017-09-22 DIAGNOSIS — IMO0002 Reserved for concepts with insufficient information to code with codable children: Secondary | ICD-10-CM

## 2017-09-22 MED ORDER — GABAPENTIN 300 MG PO CAPS: 300.0000 mg | ORAL_CAPSULE | Freq: Every day | ORAL | 0 refills | Status: DC

## 2017-09-22 MED ORDER — SILDENAFIL CITRATE 20 MG PO TABS: ORAL_TABLET | ORAL | 11 refills | Status: DC

## 2017-09-22 MED ORDER — DULAGLUTIDE 1.5 MG/0.5ML ~~LOC~~ SOAJ: SUBCUTANEOUS | 1 refills | Status: DC

## 2017-09-22 NOTE — Assessment & Plan Note (Signed)
Well-controlled; continue medicine; he is working on weight loss

## 2017-09-22 NOTE — Assessment & Plan Note (Signed)
Check liver and kidney function 

## 2017-09-22 NOTE — Assessment & Plan Note (Addendum)
BMI >35 with comorbidities (DM, GERD, sleep apnea); encouraged him to continue to lose weight; discussed healthy eating, baked foods, veggies, limiting cheese, fatty meats, etc; we calculated what weight is needed to get his BMI out of the obesity range (225 or 226 pounds is his next goal); try to lose 1-2 pounds per week

## 2017-09-22 NOTE — Progress Notes (Signed)
BP 118/76   Pulse 80   Temp 98.2 F (36.8 C) (Oral)   Resp 14   Ht 6\' 2"  (1.88 m)   Wt 274 lb 6.4 oz (124.5 kg)   SpO2 94%   BMI 35.23 kg/m    Subjective:    Patient ID: Marvin Bellis., male    DOB: 30-Dec-1955, 62 y.o.   MRN: [id number]  HPI: Marvin Anfinson. is a 62 y.o. male  Chief Complaint  Patient presents with  . Follow-up    HPI Here for f/u HTN; no adding much salt to food at all; well-controlled Type 2 diabetes; burning and numbness in the feet, both sides, distally; on multiple medicines Checking FSBS, lowest around 90 range, highest 115 Lab Results  Component Value Date   HGBA1C 8.2 (H) 05/18/2017  Morbid obesity (BMI >35 with comordibities); he is managing to lose weight; basically on baked foods and vegetables; stopped eating between, just when needed; down about 7 pounds from last visit Ramadan, cleaning out system out, trying to eat cleaner too Reviewed med list; he was taking meloxicam for his foot after the accident, not really taking that now High cholesterol; avoiding fatty meats; limiting cheese; muffin every morning, wheat bread sometimes ED; medicine is helping; would like refills  Depression screen [facility] 2/9 09/22/2017 05/18/2017 03/21/2017 02/14/2017 11/02/2016  Decreased Interest 0 0 0 0 0  Down, Depressed, Hopeless 0 0 0 0 0  PHQ - 2 Score 0 0 0 0 0    Relevant past medical, surgical, family and social history reviewed Past Medical History:  Diagnosis Date  . Decreased libido   . History of nuclear stress test    a. 05/2014: no evidence of significant ischemia, GI uptake was noted, EF 51%, no ECG changes concerning for ischemia, normal study  . HTN (hypertension)   . Hyperlipidemia   . Hypertension   . IDDM (insulin dependent diabetes mellitus) ([place])   . Obesity   . Pharyngitis   . Sleep apnea    uses CPAP   Past Surgical History:  Procedure Laterality Date  . AV FISTULA REPAIR    . CARDIAC CATHETERIZATION     [facility]   . COLONOSCOPY   2013   Family History  Problem Relation Age of Onset  . Breast cancer Mother   . Cancer Mother   . Kidney failure Father   . Diabetes Father   . Alcohol abuse Father   . Diabetes Sister    Social History   Tobacco Use  . Smoking status: Former Smoker    Packs/day: 0.25    Years: 25.00    Pack years: 6.25    Types: Cigarettes    Last attempt to quit: 05/17/1998    Years since quitting: 19.3  . Smokeless tobacco: Never Used  . Tobacco comment: off and on - maybe 5 cigarettes  Substance Use Topics  . Alcohol use: No    Alcohol/week: 0.0 oz    Comment: previously but not currently  . Drug use: No    Interim medical history since last visit reviewed. Allergies and medications reviewed  Review of Systems  Constitutional: Negative for unexpected weight change.  Respiratory: Negative for shortness of breath.   Cardiovascular: Negative for chest pain.  Neurological: Positive for numbness (neuropathy in both feet).   Per HPI unless specifically indicated above     Objective:    BP 118/76   Pulse 80   Temp 98.2 F (36.8 C) (Oral)  Resp 14   Ht 6\' 2"  (1.88 m)   Wt 274 lb 6.4 oz (124.5 kg)   SpO2 94%   BMI 35.23 kg/m   Wt Readings from Last 3 Encounters:  09/22/17 274 lb 6.4 oz (124.5 kg)  05/18/17 281 lb 8 oz (127.7 kg)  03/21/17 280 lb 14.4 oz (127.4 kg)    Physical Exam  Constitutional: He appears well-developed and well-nourished. No distress.  HENT:  Head: Normocephalic and atraumatic.  Eyes: EOM are normal. No scleral icterus.  Neck: No thyromegaly present.  Cardiovascular: Normal rate and regular rhythm.  Pulmonary/Chest: Effort normal and breath sounds normal.  Abdominal: Soft. Bowel sounds are normal. He exhibits no distension.  Musculoskeletal: He exhibits no edema.  Neurological: Coordination normal.  Skin: Skin is warm and dry. No pallor.  Psychiatric: He has a normal mood and affect. His behavior is normal. Judgment and thought content normal.     Results for orders placed or performed in visit on 05/18/17  Comprehensive metabolic panel  Result Value Ref Range   Glucose 126 (H) 65 - 99 mg/dL   BUN 16 8 - 27 mg/dL   Creatinine, Ser 1.24 0.76 - 1.27 mg/dL   GFR calc non Af Amer 63 >59 mL/min/1.73   GFR calc Af Amer 73 >59 mL/min/1.73   BUN/Creatinine Ratio 13 10 - 24   Sodium 137 134 - 144 mmol/L   Potassium 4.6 3.5 - 5.2 mmol/L   Chloride 103 96 - 106 mmol/L   CO2 19 (L) 20 - 29 mmol/L   Calcium 9.8 8.6 - 10.2 mg/dL   Total Protein 7.4 6.0 - 8.5 g/dL   Albumin 4.5 3.6 - 4.8 g/dL   Globulin, Total 2.9 1.5 - 4.5 g/dL   Albumin/Globulin Ratio 1.6 1.2 - 2.2   Bilirubin Total 0.4 0.0 - 1.2 mg/dL   Alkaline Phosphatase 72 39 - 117 IU/L   AST 18 0 - 40 IU/L   ALT 10 0 - 44 IU/L  Hemoglobin A1c  Result Value Ref Range   Hgb A1c MFr Bld 8.2 (H) 4.8 - 5.6 %   Est. average glucose Bld gHb Est-mCnc 189 mg/dL  Lipid panel  Result Value Ref Range   Cholesterol, Total 129 100 - 199 mg/dL   Triglycerides 91 0 - 149 mg/dL   HDL 42 >39 mg/dL   VLDL Cholesterol Cal 18 5 - 40 mg/dL   LDL Calculated 69 0 - 99 mg/dL   Chol/HDL Ratio 3.1 0.0 - 5.0 ratio      Assessment & Plan:   Problem List Items Addressed This Visit      Cardiovascular and Mediastinum   Benign essential HTN (Chronic)    Well-controlled; continue medicine; he is working on weight loss      Relevant Medications   sildenafil (REVATIO) 20 MG tablet     Endocrine   DM type 2, uncontrolled, with neuropathy ([place])    Foot exam by MD; start gabapentin; check A1c; discussed importance of weight loss; looking towards insulin if we can't get him controlled      Relevant Medications   empagliflozin (JARDIANCE) 10 MG TABS tablet   Dulaglutide (TRULICITY) 1.5 PY/1.9JK SOPN   Other Relevant Orders   Microalbumin / creatinine urine ratio   Hemoglobin A1c   Hemoglobin A1c   Microalbumin / creatinine urine ratio     Genitourinary   ED (erectile dysfunction)     Refill medicine; working well        Other  Hyperlipidemia - Primary (Chronic)   Relevant Medications   sildenafil (REVATIO) 20 MG tablet   Morbid obesity (HCC)    BMI >35 with comorbidities (DM, GERD, sleep apnea); encouraged him to continue to lose weight; discussed healthy eating, baked foods, veggies, limiting cheese, fatty meats, etc; we calculated what weight is needed to get his BMI out of the obesity range (225 or 226 pounds is his next goal); try to lose 1-2 pounds per week      Relevant Medications   empagliflozin (JARDIANCE) 10 MG TABS tablet   Dulaglutide (TRULICITY) 1.5 RA/0.7MA SOPN   Medication monitoring encounter    Check liver and kidney function      Relevant Orders   Comprehensive metabolic panel   Comprehensive metabolic panel       Follow up plan: Return in about 3 months (around 12/23/2017).  An after-visit summary was printed and given to the patient at [facility].  Please see the patient instructions which may contain other information and recommendations beyond what is mentioned above in the assessment and plan.  Meds ordered this encounter  Medications  . sildenafil (REVATIO) 20 MG tablet    Sig: TAKE TWO TO FIVE TABLETS BY MOUTH ONCE PRIOR TO INTIMACY ONCE A DAY    Dispense:  20 tablet    Refill:  11    Please consider 90 day supplies to promote better adherence  . Dulaglutide (TRULICITY) 1.5 UQ/3.3HL SOPN    Sig: Inject contents of one syringe once a week    Dispense:  15 pen    Refill:  1  . gabapentin (NEURONTIN) 300 MG capsule    Sig: Take 1 capsule (300 mg total) by mouth at bedtime.    Dispense:  90 capsule    Refill:  0    Orders Placed This Encounter  Procedures  . Microalbumin / creatinine urine ratio  . Hemoglobin A1c  . Comprehensive metabolic panel  . Comprehensive metabolic panel  . Hemoglobin A1c  . Microalbumin / creatinine urine ratio

## 2017-09-22 NOTE — Assessment & Plan Note (Signed)
Refill medicine; working well

## 2017-09-22 NOTE — Patient Instructions (Addendum)
Check out the information at familydoctor.org entitled "Nutrition for Weight Loss: What You Need to Know about Fad Diets" Try to lose between 1-2 pounds per week by taking in fewer calories and burning off more calories You can succeed by limiting portions, limiting foods dense in calories and fat, becoming more active, and drinking 8 glasses of water a day (64 ounces) Don't skip meals, especially breakfast, as skipping meals may alter your metabolism Do not use over-the-counter weight loss pills or gimmicks that claim rapid weight loss A healthy BMI (or body mass index) is between 18.5 and 24.9 You can calculate your ideal BMI at the [place] website ClubMonetize[employer]fr [place] for 225 pounds as your target Start the gabaptentin for the neuropathy Please have labs If you have not heard anything from my staff in a week about any orders/referrals/studies from today, please contact us here to follow-up [phone]

## 2017-09-22 NOTE — Assessment & Plan Note (Addendum)
Foot exam by MD; start gabapentin; check A1c; discussed importance of weight loss; looking towards insulin if we can't get him controlled

## 2017-09-23 ENCOUNTER — Telehealth: Payer: Self-pay | Admitting: Family Medicine

## 2017-09-23 ENCOUNTER — Other Ambulatory Visit: Payer: Self-pay | Admitting: Family Medicine

## 2017-09-23 DIAGNOSIS — Z125 Encounter for screening for malignant neoplasm of prostate: Secondary | ICD-10-CM

## 2017-09-23 LAB — HEMOGLOBIN A1C
Est. average glucose Bld gHb Est-mCnc: 131 mg/dL
Hgb A1c MFr Bld: 6.2 % — ABNORMAL HIGH (ref 4.8–5.6)

## 2017-09-23 LAB — COMPREHENSIVE METABOLIC PANEL
ALT: 6 IU/L (ref 0–44)
AST: 14 IU/L (ref 0–40)
Albumin/Globulin Ratio: 1.5 (ref 1.2–2.2)
Albumin: 4.2 g/dL (ref 3.6–4.8)
Alkaline Phosphatase: 66 IU/L (ref 39–117)
BUN/Creatinine Ratio: 13 (ref 10–24)
BUN: 17 mg/dL (ref 8–27)
Bilirubin Total: 0.5 mg/dL (ref 0.0–1.2)
CALCIUM: 9.7 mg/dL (ref 8.6–10.2)
CHLORIDE: 104 mmol/L (ref 96–106)
CO2: 18 mmol/L — ABNORMAL LOW (ref 20–29)
Creatinine, Ser: 1.31 mg/dL — ABNORMAL HIGH (ref 0.76–1.27)
GFR, EST AFRICAN AMERICAN: 68 mL/min/{1.73_m2} (ref >59)
GFR, EST NON AFRICAN AMERICAN: 59 mL/min/{1.73_m2} — AB (ref >59)
GLUCOSE: 101 mg/dL — AB (ref 65–99)
Globulin, Total: 2.8 g/dL (ref 1.5–4.5)
Potassium: 4.5 mmol/L (ref 3.5–5.2)
Sodium: 136 mmol/L (ref 134–144)
TOTAL PROTEIN: 7 g/dL (ref 6.0–8.5)

## 2017-09-23 LAB — MICROALBUMIN / CREATININE URINE RATIO
CREATININE, UR: 70.1 mg/dL
Microalb/Creat Ratio: 13.3 mg/g creat (ref 0.0–30.0)
Microalbumin, Urine: 9.3 ug/mL

## 2017-09-23 MED ORDER — GLIPIZIDE 10 MG PO TABS: 10.0000 mg | ORAL_TABLET | Freq: Every day | ORAL | 1 refills | Status: DC

## 2017-09-23 NOTE — Telephone Encounter (Signed)
Please have Jenny Reichmann ADD ON a PSA to blood in lab I talked to her Friday afternoon and she said it was fine to wait until Monday to send that order through (just needs to be released) Thank you

## 2017-09-23 NOTE — Progress Notes (Signed)
STOP sulfonylurea

## 2017-09-26 NOTE — Addendum Note (Signed)
Addended by: Cathrine Muster C on: 09/26/2017 11:20 AM   Modules accepted: Orders

## 2017-09-28 LAB — PSA: PROSTATE SPECIFIC AG, SERUM: 0.4 ng/mL (ref 0.0–4.0)

## 2017-09-28 LAB — SPECIMEN STATUS REPORT

## 2017-10-18 ENCOUNTER — Other Ambulatory Visit: Payer: Self-pay | Admitting: Family Medicine

## 2017-10-19 ENCOUNTER — Other Ambulatory Visit: Payer: Self-pay | Admitting: Family Medicine

## 2017-10-21 NOTE — Telephone Encounter (Signed)
GFR 68 Rxs approved, except for gabapentin, too soon

## 2017-10-28 LAB — HM DIABETES EYE EXAM

## 2017-11-10 ENCOUNTER — Other Ambulatory Visit: Payer: Self-pay | Admitting: Family Medicine

## 2017-12-23 ENCOUNTER — Ambulatory Visit: Payer: 59 | Admitting: Family Medicine

## 2017-12-23 ENCOUNTER — Encounter: Payer: Self-pay | Admitting: Family Medicine

## 2017-12-23 VITALS — BP 120/72 | HR 80 | Temp 98.1°F | Resp 14 | Ht 74.0 in | Wt 272.4 lb

## 2017-12-23 DIAGNOSIS — K219 Gastro-esophageal reflux disease without esophagitis: Secondary | ICD-10-CM

## 2017-12-23 DIAGNOSIS — E559 Vitamin D deficiency, unspecified: Secondary | ICD-10-CM | POA: Diagnosis not present

## 2017-12-23 DIAGNOSIS — Z5181 Encounter for therapeutic drug level monitoring: Secondary | ICD-10-CM

## 2017-12-23 DIAGNOSIS — IMO0002 Reserved for concepts with insufficient information to code with codable children: Secondary | ICD-10-CM

## 2017-12-23 DIAGNOSIS — E1165 Type 2 diabetes mellitus with hyperglycemia: Secondary | ICD-10-CM

## 2017-12-23 DIAGNOSIS — R413 Other amnesia: Secondary | ICD-10-CM

## 2017-12-23 DIAGNOSIS — I10 Essential (primary) hypertension: Secondary | ICD-10-CM

## 2017-12-23 DIAGNOSIS — G4739 Other sleep apnea: Secondary | ICD-10-CM

## 2017-12-23 DIAGNOSIS — E782 Mixed hyperlipidemia: Secondary | ICD-10-CM | POA: Diagnosis not present

## 2017-12-23 DIAGNOSIS — E1140 Type 2 diabetes mellitus with diabetic neuropathy, unspecified: Secondary | ICD-10-CM

## 2017-12-23 NOTE — Assessment & Plan Note (Signed)
Controlled; limit salt 

## 2017-12-23 NOTE — Progress Notes (Signed)
BP 120/72   Pulse 80   Temp 98.1 F (36.7 C) (Oral)   Resp 14   Ht 6\' 2"  (1.88 m)   Wt 272 lb 6.4 oz (123.6 kg)   SpO2 95%   BMI 34.97 kg/m    Subjective:    Patient ID: Marvin Duncan., male    DOB: Jul 25, 1955, 62 y.o.   MRN: [id number]  HPI: Marvin Duncan. is a 62 y.o. male  Chief Complaint  Patient presents with  . Follow-up    HPI  Trouble with short-term memory;  Going on for years No known alzheimers in the family  Vitamin D was low in November Normal TSH March 2018 No vitamin B12  Hypertension; excellent control; adds a little salt to his food, willing to cut a little  Type 2 dm; eye visit UTD; new glasses; no blurred vision; no dry mouth; avoiding sugar as much as possible Lab Results  Component Value Date   HGBA1C 6.2 (H) 09/22/2017    High cholesterol; not many fatty meats Lab Results  Component Value Date   CHOL 129 05/18/2017   HDL 42 05/18/2017   LDLCALC 69 05/18/2017   TRIG 91 05/18/2017   CHOLHDL 3.1 05/18/2017   Sleep apnea; using CPAP; feels pretty rested for the most part; getting close to 7 hours a night  GERD; no blood in the stool, controlled      Depression screen [facility] 2/9 12/23/2017 09/22/2017 05/18/2017 03/21/2017 02/14/2017  Decreased Interest 0 0 0 0 0  Down, Depressed, Hopeless 0 0 0 0 0  PHQ - 2 Score 0 0 0 0 0    Relevant past medical, surgical, family and social history reviewed Past Medical History:  Diagnosis Date  . Decreased libido   . History of nuclear stress test    a. 05/2014: no evidence of significant ischemia, GI uptake was noted, EF 51%, no ECG changes concerning for ischemia, normal study  . HTN (hypertension)   . Hyperlipidemia   . Hypertension   . IDDM (insulin dependent diabetes mellitus) ([place])   . Obesity   . Pharyngitis   . Sleep apnea    uses CPAP   Past Surgical History:  Procedure Laterality Date  . AV FISTULA REPAIR    . CARDIAC CATHETERIZATION     [facility]   . COLONOSCOPY  2013   Family  History  Problem Relation Age of Onset  . Breast cancer Mother   . Cancer Mother   . Kidney failure Father   . Diabetes Father   . Alcohol abuse Father   . Diabetes Sister    Social History   Tobacco Use  . Smoking status: Former Smoker    Packs/day: 0.25    Years: 25.00    Pack years: 6.25    Types: Cigarettes    Last attempt to quit: 05/17/1998    Years since quitting: 19.6  . Smokeless tobacco: Never Used  . Tobacco comment: off and on - maybe 5 cigarettes  Substance Use Topics  . Alcohol use: No    Alcohol/week: 0.0 standard drinks    Comment: previously but not currently  . Drug use: No    Interim medical history since last visit reviewed. Allergies and medications reviewed  Review of Systems Per HPI unless specifically indicated above     Objective:    BP 120/72   Pulse 80   Temp 98.1 F (36.7 C) (Oral)   Resp 14   Ht 6'  2" (1.88 m)   Wt 272 lb 6.4 oz (123.6 kg)   SpO2 95%   BMI 34.97 kg/m   Wt Readings from Last 3 Encounters:  12/23/17 272 lb 6.4 oz (123.6 kg)  09/22/17 274 lb 6.4 oz (124.5 kg)  05/18/17 281 lb 8 oz (127.7 kg)    Physical Exam  Constitutional: He appears well-developed and well-nourished. No distress.  HENT:  Head: Normocephalic and atraumatic.  Eyes: EOM are normal. No scleral icterus.  Neck: No thyromegaly present.  Cardiovascular: Normal rate and regular rhythm.  Pulmonary/Chest: Effort normal and breath sounds normal.  Abdominal: Soft. Bowel sounds are normal. He exhibits no distension.  Musculoskeletal: He exhibits no edema.  Neurological: Coordination normal.  Skin: Skin is warm and dry. No pallor.  Psychiatric: He has a normal mood and affect. His behavior is normal. Judgment and thought content normal.    Results for orders placed or performed in visit on 10/28/17  HM DIABETES EYE EXAM  Result Value Ref Range   HM Diabetic Eye Exam No Retinopathy No Retinopathy   Diabetic Foot Form - Detailed   Diabetic Foot Exam -  detailed Diabetic Foot exam was performed with the following findings:  Yes 12/23/2017  9:43 AM  Visual Foot Exam completed.:  Yes  Pulse Foot Exam completed.:  Yes  Right Dorsalis Pedis:  Present Left Dorsalis Pedis:  Present  Sensory Foot Exam Completed.:  Yes Semmes-Weinstein Monofilament Test R Site 1-Great Toe:  Pos L Site 1-Great Toe:  Pos           Assessment & Plan:   Problem List Items Addressed This Visit      Cardiovascular and Mediastinum   Benign essential HTN (Chronic)    Controlled; limit salt        Respiratory   Sleep apnea (Chronic)    Wearing CPAP with good results        Digestive   GERD (gastroesophageal reflux disease)    controlled        Endocrine   DM type 2, uncontrolled, with neuropathy ([place])    Foot exam by MD; eye exam UTD      Relevant Orders   Lipid panel   Hemoglobin A1c     Other   Medication monitoring encounter   Relevant Orders   Comprehensive metabolic panel   Hyperlipidemia (Chronic)    Cut down on saturated fats; LDL is at goal; continue medicine      Relevant Orders   Lipid panel    Other Visit Diagnoses    Memory impairment    -  Primary   will check vit D, vit B12, and TSH; if all normal, REFER to neurologist   Relevant Orders   Vitamin B12   TSH   Vitamin D deficiency       Relevant Orders   VITAMIN D 25 Hydroxy (Vit-D Deficiency, Fractures)       Follow up plan: Return in about 6 months (around 06/25/2018) for follow-up visit with Dr. Sanda Klein.  An after-visit summary was printed and given to the patient at [facility].  Please see the patient instructions which may contain other information and recommendations beyond what is mentioned above in the assessment and plan.  No orders of the defined types were placed in this encounter.   Orders Placed This Encounter  Procedures  . Vitamin B12  . VITAMIN D 25 Hydroxy (Vit-D Deficiency, Fractures)  . TSH  . Lipid panel  . Hemoglobin A1c  .  Comprehensive  metabolic panel

## 2017-12-23 NOTE — Assessment & Plan Note (Signed)
Foot exam by MD; eye exam UTD

## 2017-12-23 NOTE — Assessment & Plan Note (Signed)
Cut down on saturated fats; LDL is at goal; continue medicine

## 2017-12-23 NOTE — Assessment & Plan Note (Signed)
Wearing CPAP with good results

## 2017-12-23 NOTE — Patient Instructions (Signed)
Please do see your eye doctor regularly, and have your eyes examined every year (or more often per his or her recommendation) Check your feet every night and let me know right away of any sores, infections, numbness, etc. Try to limit sweets, white bread, white rice, white potatoes It is okay with me for you to not check your fingerstick blood sugars (per [employer] of Endocrinology Best Practices), unless you are interested and feel it would be helpful for you Check out the information at familydoctor.org entitled "Nutrition for Weight Loss: What You Need to Know about Fad Diets" Try to lose between 1-2 pounds per week by taking in fewer calories and burning off more calories You can succeed by limiting portions, limiting foods dense in calories and fat, becoming more active, and drinking 8 glasses of water a day (64 ounces) Don't skip meals, especially breakfast, as skipping meals may alter your metabolism Do not use over-the-counter weight loss pills or gimmicks that claim rapid weight loss A healthy BMI (or body mass index) is between 18.5 and 24.9 You can calculate your ideal BMI at the [place] website ClubMonetize.fr Try to follow the DASH guidelines (DASH stands for Dietary Approaches to Stop Hypertension). Try to limit the sodium in your diet to no more than 1,500mg  of sodium per day. Certainly try to not exceed 2,000 mg per day at the very most. Do not add salt when cooking or at the table.  Check the sodium amount on labels when shopping, and choose items lower in sodium when given a choice. Avoid or limit foods that already contain a lot of sodium. Eat a diet rich in fruits and vegetables and whole grains, and try to lose weight if overweight or obese Try to limit saturated fats in your diet (bologna, hot dogs, barbeque, cheeseburgers, hamburgers, steak, bacon, sausage, cheese, etc.) and get more fresh fruits, vegetables, and whole  grains

## 2017-12-23 NOTE — Assessment & Plan Note (Signed)
controlled 

## 2017-12-24 ENCOUNTER — Other Ambulatory Visit: Payer: Self-pay | Admitting: Family Medicine

## 2017-12-24 DIAGNOSIS — R413 Other amnesia: Secondary | ICD-10-CM

## 2017-12-24 LAB — COMPREHENSIVE METABOLIC PANEL
ALT: 7 IU/L (ref 0–44)
AST: 16 IU/L (ref 0–40)
Albumin/Globulin Ratio: 1.5 (ref 1.2–2.2)
Albumin: 4.1 g/dL (ref 3.6–4.8)
Alkaline Phosphatase: 64 IU/L (ref 39–117)
BILIRUBIN TOTAL: 0.4 mg/dL (ref 0.0–1.2)
BUN/Creatinine Ratio: 13 (ref 10–24)
BUN: 17 mg/dL (ref 8–27)
CO2: 21 mmol/L (ref 20–29)
CREATININE: 1.29 mg/dL — AB (ref 0.76–1.27)
Calcium: 9.6 mg/dL (ref 8.6–10.2)
Chloride: 103 mmol/L (ref 96–106)
GFR calc Af Amer: 69 mL/min/{1.73_m2} (ref >59)
GFR calc non Af Amer: 60 mL/min/{1.73_m2} (ref >59)
GLUCOSE: 106 mg/dL — AB (ref 65–99)
Globulin, Total: 2.8 g/dL (ref 1.5–4.5)
Potassium: 4.7 mmol/L (ref 3.5–5.2)
Sodium: 139 mmol/L (ref 134–144)
Total Protein: 6.9 g/dL (ref 6.0–8.5)

## 2017-12-24 LAB — VITAMIN B12: VITAMIN B 12: 782 pg/mL (ref 232–1245)

## 2017-12-24 LAB — LIPID PANEL
CHOL/HDL RATIO: 3.6 ratio (ref 0.0–5.0)
CHOLESTEROL TOTAL: 143 mg/dL (ref 100–199)
HDL: 40 mg/dL (ref >39)
LDL Calculated: 89 mg/dL (ref 0–99)
Triglycerides: 71 mg/dL (ref 0–149)
VLDL Cholesterol Cal: 14 mg/dL (ref 5–40)

## 2017-12-24 LAB — HEMOGLOBIN A1C
ESTIMATED AVERAGE GLUCOSE: 137 mg/dL
Hgb A1c MFr Bld: 6.4 % — ABNORMAL HIGH (ref 4.8–5.6)

## 2017-12-24 LAB — TSH: TSH: 1.49 u[IU]/mL (ref 0.450–4.500)

## 2017-12-24 LAB — VITAMIN D 25 HYDROXY (VIT D DEFICIENCY, FRACTURES): VIT D 25 HYDROXY: 32.6 ng/mL (ref 30.0–100.0)

## 2017-12-24 MED ORDER — PRAVASTATIN SODIUM 80 MG PO TABS: 80.0000 mg | ORAL_TABLET | Freq: Every day | ORAL | 3 refills | Status: DC

## 2017-12-24 NOTE — Progress Notes (Signed)
Refer to neuro; vit B12, D, and TSH normal

## 2017-12-26 ENCOUNTER — Telehealth: Payer: Self-pay

## 2017-12-26 DIAGNOSIS — E782 Mixed hyperlipidemia: Secondary | ICD-10-CM

## 2017-12-26 NOTE — Telephone Encounter (Signed)
-----   Message from Arnetha Courser, MD sent at 12/24/2017 12:04 PM EDT ----- Marvin Duncan, please let the patient that the testing for vitamin B12 deficiency, vitamin D deficiency, and thyroid function were all normal; for his memory issues, I've put in a referral for him to see a neurologist His vitamin D is at the lower end of normal, so we'll recommend 800 iu of vitamin D3 once a day His LDL has gone up from 69 to 89, a 20 point increase; I'd like to increase his pravastatin from 40 mg to 80 mg and encourage him to really decrease his saturated fat intake; please ORDER a lipid panel for him to have drawn in 6-8 weeks His A1c is excellent; kidney function stable, liver tests are normal; thank you

## 2018-01-10 ENCOUNTER — Other Ambulatory Visit: Payer: Self-pay | Admitting: Family Medicine

## 2018-01-11 NOTE — Telephone Encounter (Signed)
Last appt:8/9

## 2018-01-31 DIAGNOSIS — R413 Other amnesia: Secondary | ICD-10-CM | POA: Insufficient documentation

## 2018-02-15 ENCOUNTER — Other Ambulatory Visit: Payer: Self-pay | Admitting: Family Medicine

## 2018-04-03 ENCOUNTER — Other Ambulatory Visit: Payer: Self-pay | Admitting: Family Medicine

## 2018-04-05 ENCOUNTER — Other Ambulatory Visit: Payer: Self-pay | Admitting: Family Medicine

## 2018-04-05 ENCOUNTER — Other Ambulatory Visit: Payer: Self-pay

## 2018-04-05 MED ORDER — DULAGLUTIDE 1.5 MG/0.5ML ~~LOC~~ SOAJ: SUBCUTANEOUS | 1 refills | Status: DC

## 2018-04-05 NOTE — Telephone Encounter (Signed)
Duplicate I already addressed trulicity Rx earlier today

## 2018-04-05 NOTE — Telephone Encounter (Signed)
GFR 69 Rx approved

## 2018-04-07 ENCOUNTER — Other Ambulatory Visit: Payer: Self-pay | Admitting: Family Medicine

## 2018-04-17 ENCOUNTER — Encounter: Payer: Self-pay | Admitting: Family Medicine

## 2018-04-17 ENCOUNTER — Ambulatory Visit: Payer: 59 | Admitting: Family Medicine

## 2018-04-17 VITALS — BP 126/78 | HR 92 | Temp 97.8°F | Ht 73.5 in | Wt 260.9 lb

## 2018-04-17 DIAGNOSIS — E1140 Type 2 diabetes mellitus with diabetic neuropathy, unspecified: Secondary | ICD-10-CM

## 2018-04-17 DIAGNOSIS — IMO0002 Reserved for concepts with insufficient information to code with codable children: Secondary | ICD-10-CM

## 2018-04-17 DIAGNOSIS — J0100 Acute maxillary sinusitis, unspecified: Secondary | ICD-10-CM

## 2018-04-17 DIAGNOSIS — Z6834 Body mass index (BMI) 34.0-34.9, adult: Secondary | ICD-10-CM | POA: Insufficient documentation

## 2018-04-17 DIAGNOSIS — E1165 Type 2 diabetes mellitus with hyperglycemia: Secondary | ICD-10-CM | POA: Diagnosis not present

## 2018-04-17 DIAGNOSIS — E669 Obesity, unspecified: Secondary | ICD-10-CM | POA: Diagnosis not present

## 2018-04-17 MED ORDER — GLIPIZIDE 10 MG PO TABS: 10.0000 mg | ORAL_TABLET | Freq: Two times a day (BID) | ORAL | 1 refills | Status: DC

## 2018-04-17 MED ORDER — AMOXICILLIN-POT CLAVULANATE 875-125 MG PO TABS: 1.0000 | ORAL_TABLET | Freq: Two times a day (BID) | ORAL | 0 refills | Status: AC

## 2018-04-17 NOTE — Patient Instructions (Addendum)
Start the antibiotics Please do eat yogurt or kimchi or take a probiotic daily for the next month We want to replace the healthy germs in the gut If you notice foul, watery diarrhea in the next two months, schedule an appointment RIGHT AWAY or go to an urgent care or the emergency room if a holiday or over a weekend Call if needed You can start back on the sugar medicine glipizide but don't take if sugars drop too low Return in February for your next check but call me before then if needed Keep up the great job with weight loss

## 2018-04-17 NOTE — Progress Notes (Signed)
BP 126/78   Pulse 92   Temp 97.8 F (36.6 C) (Oral)   Ht 6' 1.5" (1.867 m)   Wt 260 lb 14.4 oz (118.3 kg)   SpO2 98%   BMI 33.95 kg/m    Subjective:    Patient ID: Marvin Bellis., male    DOB: 1955/07/29, 62 y.o.   MRN: [id number]  HPI: Marvin Holsworth. is a 62 y.o. male  Chief Complaint  Patient presents with  . Sinusitis    onset 2 weeks sypmtoms include: sinus drainage, sore throat, congestion and cough    HPI Patient is here for a sick visit  Sinus congestion, drainage, sore throat, cough No real fevers Slight headache Worse when going outside Tried theraflu, OTC  Would like to get back on glipizide; more frequent urination; no real blurred vision; dry mouth; he used to take it and wants to start back No low sugars Lab Results  Component Value Date   HGBA1C 6.4 (H) 12/23/2017   He is working on weight loss; down 12 pounds with smarter eating; no worries  Depression screen [facility] 2/9 04/17/2018 12/23/2017 09/22/2017 05/18/2017 03/21/2017  Decreased Interest 0 0 0 0 0  Down, Depressed, Hopeless 0 0 0 0 0  PHQ - 2 Score 0 0 0 0 0  Altered sleeping 0 - - - -  Tired, decreased energy 0 - - - -  Change in appetite 0 - - - -  Feeling bad or failure about yourself  0 - - - -  Trouble concentrating 0 - - - -  Moving slowly or fidgety/restless 0 - - - -  Suicidal thoughts 0 - - - -  PHQ-9 Score 0 - - - -  Difficult doing work/chores Not difficult at all - - - -   Fall Risk  04/17/2018 12/23/2017 09/22/2017 05/18/2017 03/21/2017  Falls in the past year? 0 No No No No  Number falls in past yr: 0 - - - -  Injury with Fall? 0 - - - -    Relevant past medical, surgical, family and social history reviewed Past Medical History:  Diagnosis Date  . Decreased libido   . History of nuclear stress test    a. 05/2014: no evidence of significant ischemia, GI uptake was noted, EF 51%, no ECG changes concerning for ischemia, normal study  . HTN (hypertension)   . Hyperlipidemia   .  Hypertension   . IDDM (insulin dependent diabetes mellitus) ([place])   . Obesity   . Pharyngitis   . Sleep apnea    uses CPAP   Past Surgical History:  Procedure Laterality Date  . AV FISTULA REPAIR    . CARDIAC CATHETERIZATION     [facility]   . COLONOSCOPY  2013   Family History  Problem Relation Age of Onset  . Breast cancer Mother   . Cancer Mother   . Kidney failure Father   . Diabetes Father   . Alcohol abuse Father   . Diabetes Sister    Social History   Tobacco Use  . Smoking status: Former Smoker    Packs/day: 0.25    Years: 25.00    Pack years: 6.25    Types: Cigarettes    Last attempt to quit: 05/17/1998    Years since quitting: 19.9  . Smokeless tobacco: Never Used  . Tobacco comment: off and on - maybe 5 cigarettes  Substance Use Topics  . Alcohol use: No    Alcohol/week:  0.0 standard drinks    Comment: previously but not currently  . Drug use: No     Office Visit from 04/17/2018 in [facility]  AUDIT-C Score  0      Interim medical history since last visit reviewed. Allergies and medications reviewed  Review of Systems Per HPI unless specifically indicated above     Objective:    BP 126/78   Pulse 92   Temp 97.8 F (36.6 C) (Oral)   Ht 6' 1.5" (1.867 m)   Wt 260 lb 14.4 oz (118.3 kg)   SpO2 98%   BMI 33.95 kg/m   Wt Readings from Last 3 Encounters:  04/17/18 260 lb 14.4 oz (118.3 kg)  12/23/17 272 lb 6.4 oz (123.6 kg)  09/22/17 274 lb 6.4 oz (124.5 kg)    Physical Exam  Constitutional: He appears well-developed and well-nourished. No distress.  HENT:  Right Ear: Tympanic membrane and ear canal normal.  Left Ear: Tympanic membrane and ear canal normal.  Nose: Mucosal edema and rhinorrhea present.  Mouth/Throat: No posterior oropharyngeal edema or posterior oropharyngeal erythema.  Scant amount of blood in the mouth which patient reports is from dental work done earlier today  Eyes: No scleral icterus.  Cardiovascular:  Normal rate and regular rhythm.  Pulmonary/Chest: Effort normal and breath sounds normal.  Lymphadenopathy:    He has no cervical adenopathy.  Neurological: He is alert.  Skin: No pallor.  Psychiatric: He has a normal mood and affect.   Diabetic Foot Form - Detailed   Diabetic Foot Exam - detailed Diabetic Foot exam was performed with the following findings:  Yes 04/17/2018  4:36 PM  Visual Foot Exam completed.:  Yes  Pulse Foot Exam completed.:  Yes  Right Dorsalis Pedis:  Present Left Dorsalis Pedis:  Present  Sensory Foot Exam Completed.:  Yes Semmes-Weinstein Monofilament Test R Site 1-Great Toe:  Pos L Site 1-Great Toe:  Pos        Results for orders placed or performed in visit on 12/23/17  Vitamin B12  Result Value Ref Range   Vitamin B-12 782 232 - 1,245 pg/mL  VITAMIN D 25 Hydroxy (Vit-D Deficiency, Fractures)  Result Value Ref Range   Vit D, 25-Hydroxy 32.6 30.0 - 100.0 ng/mL  TSH  Result Value Ref Range   TSH 1.490 0.450 - 4.500 uIU/mL  Lipid panel  Result Value Ref Range   Cholesterol, Total 143 100 - 199 mg/dL   Triglycerides 71 0 - 149 mg/dL   HDL 40 >39 mg/dL   VLDL Cholesterol Cal 14 5 - 40 mg/dL   LDL Calculated 89 0 - 99 mg/dL   Chol/HDL Ratio 3.6 0.0 - 5.0 ratio  Hemoglobin A1c  Result Value Ref Range   Hgb A1c MFr Bld 6.4 (H) 4.8 - 5.6 %   Est. average glucose Bld gHb Est-mCnc 137 mg/dL  Comprehensive metabolic panel  Result Value Ref Range   Glucose 106 (H) 65 - 99 mg/dL   BUN 17 8 - 27 mg/dL   Creatinine, Ser 1.29 (H) 0.76 - 1.27 mg/dL   GFR calc non Af Amer 60 >59 mL/min/1.73   GFR calc Af Amer 69 >59 mL/min/1.73   BUN/Creatinine Ratio 13 10 - 24   Sodium 139 134 - 144 mmol/L   Potassium 4.7 3.5 - 5.2 mmol/L   Chloride 103 96 - 106 mmol/L   CO2 21 20 - 29 mmol/L   Calcium 9.6 8.6 - 10.2 mg/dL   Total  Protein 6.9 6.0 - 8.5 g/dL   Albumin 4.1 3.6 - 4.8 g/dL   Globulin, Total 2.8 1.5 - 4.5 g/dL   Albumin/Globulin Ratio 1.5 1.2 - 2.2    Bilirubin Total 0.4 0.0 - 1.2 mg/dL   Alkaline Phosphatase 64 39 - 117 IU/L   AST 16 0 - 40 IU/L   ALT 7 0 - 44 IU/L      Assessment & Plan:   Problem List Items Addressed This Visit      Endocrine   DM type 2, uncontrolled, with neuropathy ([place])    Start back on the sulfonylurea, but cautioned patient about risk of hypoglycemia; he reports no episodes now      Relevant Medications   glipiZIDE (GLUCOTROL) 10 MG tablet     Other   Obesity (BMI 30.0-34.9)    Praise given for intentional weight loss       Other Visit Diagnoses    Acute non-recurrent maxillary sinusitis    -  Primary   start antibiotics; supportive care; C diff precautions discussed   Relevant Medications   amoxicillin-clavulanate (AUGMENTIN) 875-125 MG tablet       Follow up plan: No follow-ups on file.  An after-visit summary was printed and given to the patient at [facility].  Please see the patient instructions which may contain other information and recommendations beyond what is mentioned above in the assessment and plan.  Meds ordered this encounter  Medications  . glipiZIDE (GLUCOTROL) 10 MG tablet    Sig: Take 1 tablet (10 mg total) by mouth 2 (two) times daily before a meal. For high sugars    Dispense:  180 tablet    Refill:  1    New instructions  . amoxicillin-clavulanate (AUGMENTIN) 875-125 MG tablet    Sig: Take 1 tablet by mouth 2 (two) times daily for 10 days.    Dispense:  20 tablet    Refill:  0    No orders of the defined types were placed in this encounter.

## 2018-04-17 NOTE — Assessment & Plan Note (Signed)
Start back on the sulfonylurea, but cautioned patient about risk of hypoglycemia; he reports no episodes now

## 2018-04-17 NOTE — Assessment & Plan Note (Signed)
Praise given for intentional weight loss

## 2018-05-24 ENCOUNTER — Other Ambulatory Visit: Payer: Self-pay | Admitting: Family Medicine

## 2018-05-24 NOTE — Telephone Encounter (Signed)
Last GFR 69 Rx approved

## 2018-05-25 ENCOUNTER — Other Ambulatory Visit: Payer: Self-pay | Admitting: Family Medicine

## 2018-06-06 ENCOUNTER — Encounter: Payer: Self-pay | Admitting: Nurse Practitioner

## 2018-06-06 ENCOUNTER — Ambulatory Visit: Payer: 59 | Admitting: Nurse Practitioner

## 2018-06-06 ENCOUNTER — Other Ambulatory Visit: Payer: Self-pay | Admitting: Family Medicine

## 2018-06-06 VITALS — BP 142/76 | HR 98 | Temp 98.8°F | Resp 16 | Ht 74.0 in | Wt 267.6 lb

## 2018-06-06 DIAGNOSIS — R05 Cough: Secondary | ICD-10-CM

## 2018-06-06 DIAGNOSIS — R059 Cough, unspecified: Secondary | ICD-10-CM

## 2018-06-06 DIAGNOSIS — J069 Acute upper respiratory infection, unspecified: Secondary | ICD-10-CM

## 2018-06-06 MED ORDER — BENZONATATE 200 MG PO CAPS: 200.0000 mg | ORAL_CAPSULE | Freq: Two times a day (BID) | ORAL | 0 refills | Status: DC | PRN

## 2018-06-06 NOTE — Patient Instructions (Signed)
You likely have a viral upper respiratory infection (URI). Antibiotics will not reduce the number of days you are ill or prevent you from getting bacterial rhinosinusitis. A URI can take up to 14 days to resolve, but typically last between 7-11 days. Your body is so smart and strong that it will be fighting this illness off for you but it is important that you drink plenty of fluids, rest. Cover your nose/mouth when you cough or sneeze and wash your hands well and often. Here are some helpful things you can use or pick up over the counter from the pharmacy to help with your symptoms:   For Fever/Pain: Acetaminophen every 6 hours as needed (maximum of 3000mg  a day). If you are still uncomfortable you can add ibuprofen OR naproxen  For coughing: try dextromethorphan for a cough suppressant, and/or a cool mist humidifier, lozenges  For sore throat: saline gargles, honey herbal tea, lozenges, throat spray  To dry out your nose: try an antihistamine like loratadine (non-sedating) or diphenhydramine (sedating) or others To relieve a stuffy nose: try an oral decongestant  Like pseudoephedrine if you are under the age of 108 and do not have high blood pressure, neti pot To make blowing your nose easier and relieve chest congestion: guaifenesin 400mg  every 4-6 hours of guaifenesin ER [phone] mg every 12 hours. Do not take more than 2,400mg  a day.

## 2018-06-06 NOTE — Progress Notes (Signed)
Name: Marvin MONTMINY Sr.   MRN: [id number]    DOB: 23-Mar-1956   Date:06/06/2018       Progress Note  Subjective  Chief Complaint  Chief Complaint  Patient presents with  . Sore Throat    patient present with sore throat on the upper right side since wednesday   . Cough    dry - uncontrollable when in a cold environment  . Wheezing  . Nasal Congestion    thera flu    HPI  Patient presents with sore throat last Thursday- sore throat, progressed to dry cough, nasals congestions. Has tried theraflu, warm teas, lozenges. Patient states dry cough is the worst part- triggered outside.   Denies fevers, chills, chest pain, shortness of breath, chest pain.  Patient Active Problem List   Diagnosis Date Noted  . Obesity (BMI 30.0-34.9) 04/17/2018  . Ventral hernia without obstruction or gangrene 05/13/2016  . Plantar fasciitis of left foot 05/13/2016  . GERD (gastroesophageal reflux disease) 01/04/2016  . Leg cramps 12/26/2015  . Medication monitoring encounter 09/26/2015  . Screening for HIV (human immunodeficiency virus) 09/26/2015  . Screening for colon cancer 09/26/2015  . Screening for prostate cancer 09/26/2015  . Chest pain with moderate risk for cardiac etiology 05/01/2015  . Drug intolerance 03/06/2015  . DM type 2, uncontrolled, with neuropathy ([place]) 03/06/2015  . ED (erectile dysfunction) 10/24/2014  . Hyperlipidemia 05/23/2014  . Sleep apnea 05/23/2014  . Benign essential HTN 02/14/2007    Past Medical History:  Diagnosis Date  . Decreased libido   . History of nuclear stress test    a. 05/2014: no evidence of significant ischemia, GI uptake was noted, EF 51%, no ECG changes concerning for ischemia, normal study  . HTN (hypertension)   . Hyperlipidemia   . Hypertension   . IDDM (insulin dependent diabetes mellitus) ([place])   . Obesity   . Pharyngitis   . Sleep apnea    uses CPAP    Past Surgical History:  Procedure Laterality Date  . AV FISTULA REPAIR    .  CARDIAC CATHETERIZATION     [facility]   . COLONOSCOPY  2013    Social History   Tobacco Use  . Smoking status: Former Smoker    Packs/day: 0.25    Years: 25.00    Pack years: 6.25    Types: Cigarettes    Last attempt to quit: 05/17/1998    Years since quitting: 20.0  . Smokeless tobacco: Never Used  . Tobacco comment: off and on - maybe 5 cigarettes  Substance Use Topics  . Alcohol use: No    Alcohol/week: 0.0 standard drinks    Comment: previously but not currently     Current Outpatient Medications:  .  amLODipine (NORVASC) 10 MG tablet, TAKE 1 TABLET BY MOUTH  DAILY, Disp: 90 tablet, Rfl: 3 .  aspirin 81 MG tablet, Take 81 mg by mouth daily., Disp: , Rfl:  .  Cholecalciferol (VITAMIN D) 2000 UNITS tablet, Take 2,000 Units by mouth daily., Disp: , Rfl:  .  Dulaglutide (TRULICITY) 1.5 WU/9.8JX SOPN, Inject contents of one syringe once a week, Disp: 15 pen, Rfl: 1 .  Flaxseed, Linseed, (FLAXSEED OIL) 1000 MG CAPS, Take 1,000 mg by mouth daily. , Disp: , Rfl:  .  fluticasone (FLONASE) 50 MCG/ACT nasal spray, USE 2 SPRAYS IN EACH  NOSTRIL DAILY (IF NEEDED  FOR ALLERGY SYMPTOMS), Disp: 48 g, Rfl: 0 .  gabapentin (NEURONTIN) 300 MG capsule, TAKE 1 CAPSULE BY  MOUTH AT  BEDTIME, Disp: 90 capsule, Rfl: 3 .  glipiZIDE (GLUCOTROL) 10 MG tablet, Take 1 tablet (10 mg total) by mouth 2 (two) times daily before a meal. For high sugars, Disp: 180 tablet, Rfl: 1 .  Insulin Pen Needle 32G X 6 MM MISC, 1 Package by Does not apply route once., Disp: 100 each, Rfl: 3 .  JARDIANCE 10 MG TABS tablet, TAKE 1 TABLET BY MOUTH  DAILY FOR DIABETES, Disp: 90 tablet, Rfl: 1 .  losartan (COZAAR) 100 MG tablet, TAKE 1 TABLET BY MOUTH  DAILY, Disp: 90 tablet, Rfl: 3 .  metFORMIN (GLUCOPHAGE) 1000 MG tablet, TAKE 1 TABLET BY MOUTH TWO  TIMES DAILY WITH MEALS, Disp: 180 tablet, Rfl: 1 .  Omega-3 Fatty Acids (FISH OIL) 1000 MG CAPS, Take 1,000 mg by mouth daily., Disp: , Rfl:  .  pravastatin (PRAVACHOL) 80 MG tablet,  Take 1 tablet (80 mg total) by mouth daily., Disp: 90 tablet, Rfl: 3 .  sildenafil (REVATIO) 20 MG tablet, TAKE 2 TO 5 TABLETS BY MOUTH ONCE PRIOR TO INTIMACY ONCE A DAY, Disp: 20 tablet, Rfl: 5 .  traZODone (DESYREL) 100 MG tablet, TAKE 1 TABLET BY MOUTH AT  BEDTIME AS NEEDED FOR SLEEP, Disp: 90 tablet, Rfl: 3 .  vitamin B-12 (CYANOCOBALAMIN) 1000 MCG tablet, Take 1,000 mcg by mouth daily., Disp: , Rfl:   Allergies  Allergen Reactions  . Atorvastatin Other (See Comments)    Statins cause headaches and muscle aches    ROS   No other specific complaints in a complete review of systems (except as listed in HPI above).  Objective  Vitals:   06/06/18 0843  BP: (!) 142/76  Pulse: 98  Resp: 16  Temp: 98.8 F (37.1 C)  TempSrc: Oral  SpO2: 95%  Weight: 267 lb 9.6 oz (121.4 kg)  Height: 6\' 2"  (1.88 m)    Body mass index is 34.36 kg/m.  Nursing Note and Vital Signs reviewed.  Physical Exam HENT:     Head: Normocephalic and atraumatic.     Right Ear: Hearing, tympanic membrane, ear canal and external ear normal.     Left Ear: Hearing, tympanic membrane, ear canal and external ear normal.     Nose: Nose normal.     Right Sinus: No maxillary sinus tenderness or frontal sinus tenderness.     Left Sinus: No maxillary sinus tenderness or frontal sinus tenderness.     Mouth/Throat:     Mouth: Mucous membranes are moist.     Pharynx: Uvula midline. No oropharyngeal exudate or posterior oropharyngeal erythema.  Eyes:     General:        Right eye: No discharge.        Left eye: No discharge.     Conjunctiva/sclera: Conjunctivae normal.  Neck:     Musculoskeletal: Normal range of motion.  Cardiovascular:     Rate and Rhythm: Normal rate.  Pulmonary:     Effort: Pulmonary effort is normal.     Breath sounds: Normal breath sounds.  Lymphadenopathy:     Cervical: No cervical adenopathy.  Skin:    General: Skin is warm and dry.     Findings: No rash.  Neurological:      Mental Status: He is alert.  Psychiatric:        Judgment: Judgment normal.        No results found for this or any previous visit (from the past 48 hour(s)).  Assessment & Plan  1. Cough -  benzonatate (TESSALON) 200 MG capsule; Take 1 capsule (200 mg total) by mouth 2 (two) times daily as needed for cough.  Dispense: 30 capsule; Refill: 0  2. Upper respiratory tract infection, unspecified type Discussed course of viral URI's, signs and symptoms of secondary infections and when to return for care.  - benzonatate (TESSALON) 200 MG capsule; Take 1 capsule (200 mg total) by mouth 2 (two) times daily as needed for cough.  Dispense: 30 capsule; Refill: 0

## 2018-06-26 ENCOUNTER — Encounter: Payer: Self-pay | Admitting: Family Medicine

## 2018-06-26 ENCOUNTER — Ambulatory Visit
Admission: RE | Admit: 2018-06-26 | Discharge: 2018-06-26 | Disposition: A | Discharge status: Home / Self Care | Payer: 59 | Source: Ambulatory Visit | Attending: Family Medicine | Admitting: Family Medicine

## 2018-06-26 ENCOUNTER — Other Ambulatory Visit: Payer: Self-pay | Admitting: Family Medicine

## 2018-06-26 ENCOUNTER — Ambulatory Visit: Payer: 59 | Admitting: Family Medicine

## 2018-06-26 VITALS — BP 138/80 | HR 94 | Temp 98.0°F | Ht 74.0 in | Wt 262.2 lb

## 2018-06-26 DIAGNOSIS — R059 Cough, unspecified: Secondary | ICD-10-CM

## 2018-06-26 DIAGNOSIS — R05 Cough: Secondary | ICD-10-CM

## 2018-06-26 DIAGNOSIS — N5201 Erectile dysfunction due to arterial insufficiency: Secondary | ICD-10-CM

## 2018-06-26 DIAGNOSIS — G4739 Other sleep apnea: Secondary | ICD-10-CM | POA: Diagnosis not present

## 2018-06-26 DIAGNOSIS — E1140 Type 2 diabetes mellitus with diabetic neuropathy, unspecified: Secondary | ICD-10-CM | POA: Diagnosis not present

## 2018-06-26 DIAGNOSIS — I10 Essential (primary) hypertension: Secondary | ICD-10-CM | POA: Diagnosis not present

## 2018-06-26 DIAGNOSIS — IMO0002 Reserved for concepts with insufficient information to code with codable children: Secondary | ICD-10-CM

## 2018-06-26 DIAGNOSIS — E1165 Type 2 diabetes mellitus with hyperglycemia: Secondary | ICD-10-CM

## 2018-06-26 DIAGNOSIS — Z125 Encounter for screening for malignant neoplasm of prostate: Secondary | ICD-10-CM

## 2018-06-26 DIAGNOSIS — K439 Ventral hernia without obstruction or gangrene: Secondary | ICD-10-CM

## 2018-06-26 DIAGNOSIS — E669 Obesity, unspecified: Secondary | ICD-10-CM

## 2018-06-26 DIAGNOSIS — Z5181 Encounter for therapeutic drug level monitoring: Secondary | ICD-10-CM

## 2018-06-26 DIAGNOSIS — E782 Mixed hyperlipidemia: Secondary | ICD-10-CM

## 2018-06-26 DIAGNOSIS — R0789 Other chest pain: Secondary | ICD-10-CM

## 2018-06-26 DIAGNOSIS — J9811 Atelectasis: Secondary | ICD-10-CM | POA: Insufficient documentation

## 2018-06-26 MED ORDER — FLUTICASONE PROPIONATE HFA 110 MCG/ACT IN AERO: 2.0000 | INHALATION_SPRAY | Freq: Two times a day (BID) | RESPIRATORY_TRACT | 12 refills | Status: DC

## 2018-06-26 MED ORDER — OMEPRAZOLE 20 MG PO CPDR: 20.0000 mg | DELAYED_RELEASE_CAPSULE | Freq: Every day | ORAL | 0 refills | Status: DC

## 2018-06-26 MED ORDER — ALBUTEROL SULFATE HFA 108 (90 BASE) MCG/ACT IN AERS: 2.0000 | INHALATION_SPRAY | RESPIRATORY_TRACT | 1 refills | Status: DC | PRN

## 2018-06-26 NOTE — Assessment & Plan Note (Signed)
Check liver and kidneys 

## 2018-06-26 NOTE — Assessment & Plan Note (Signed)
praqise given for some pounds off; keep it up

## 2018-06-26 NOTE — Assessment & Plan Note (Signed)
Try to limit saturated fats; check lipids today; continue statin

## 2018-06-26 NOTE — Assessment & Plan Note (Signed)
Foot exam by MD today; check labs 

## 2018-06-26 NOTE — Assessment & Plan Note (Signed)
Using CPAP 

## 2018-06-26 NOTE — Assessment & Plan Note (Signed)
Refer back to surgeon

## 2018-06-26 NOTE — Progress Notes (Signed)
BP 138/80   Pulse 94   Temp 98 F (36.7 C)   Ht 6\' 2"  (1.88 m)   Wt 262 lb 3.2 oz (118.9 kg)   SpO2 97%   BMI 33.66 kg/m    Subjective:    Patient ID: Marvin Bellis., male    DOB: 1956/03/17, 63 y.o.   MRN: [id number]  HPI: Marvin Norwood. is a 63 y.o. male  Chief Complaint  Patient presents with  . Follow-up    HPI  Triggers are cold, talking too much, laughter Going on for 1+ month or so Tessalon perles did not help Rest and sleep help Started with real sore throat, but that got better when he saw NP Persistent cough, very little production, mostly dry Not on ACE-I No acid reflux Allergic to something which created cough  Type 2 diabetes mellitus Hoping for better numbers Trying to avoid sugary drinks; wheat bread, Glace rice Eye exam in June  Hypertension Trying to avoid salt  High cholesterol Using [place], no pork No aches on the statin  Sleep apnea Using machine; finds it helpful  GERD Takes apple cider vinegar Pain in the chest at times when he coughs, like a knot in the middle, upper third; every 4-5 days, made worse with sneezing or coughing; air locked up; not a pressure or tightness; once he burps, it loosens up; no blood in the stool; food it not sticking when he swallows  Ventral hernia below the umbilicus, diastasis recti, loose muscle; twinges of pain at times; would like to have it repaired; saw surgeon already  Saw heart doctor a few years ago  Depression screen [facility] 2/9 06/26/2018 06/06/2018 04/17/2018 12/23/2017 09/22/2017  Decreased Interest 0 0 0 0 0  Down, Depressed, Hopeless 0 0 0 0 0  PHQ - 2 Score 0 0 0 0 0  Altered sleeping 0 0 0 - -  Tired, decreased energy 0 0 0 - -  Change in appetite 0 0 0 - -  Feeling bad or failure about yourself  0 0 0 - -  Trouble concentrating 0 0 0 - -  Moving slowly or fidgety/restless 0 0 0 - -  Suicidal thoughts 0 0 0 - -  PHQ-9 Score 0 0 0 - -  Difficult doing work/chores Not difficult at all  Not difficult at all Not difficult at all - -   Fall Risk  06/26/2018 06/06/2018 04/17/2018 12/23/2017 09/22/2017  Falls in the past year? 0 0 0 No No  Number falls in past yr: - 0 0 - -  Injury with Fall? - 0 0 - -    Relevant past medical, surgical, family and social history reviewed Past Medical History:  Diagnosis Date  . Decreased libido   . History of nuclear stress test    a. 05/2014: no evidence of significant ischemia, GI uptake was noted, EF 51%, no ECG changes concerning for ischemia, normal study  . HTN (hypertension)   . Hyperlipidemia   . Hypertension   . IDDM (insulin dependent diabetes mellitus) ([place])   . Obesity   . Pharyngitis   . Sleep apnea    uses CPAP   Past Surgical History:  Procedure Laterality Date  . AV FISTULA REPAIR    . CARDIAC CATHETERIZATION     [facility]   . COLONOSCOPY  2013   Family History  Problem Relation Age of Onset  . Breast cancer Mother   . Cancer Mother   .  Kidney failure Father   . Diabetes Father   . Alcohol abuse Father   . Diabetes Sister   . Other Daughter    Social History   Tobacco Use  . Smoking status: Former Smoker    Packs/day: 0.25    Years: 25.00    Pack years: 6.25    Types: Cigarettes    Last attempt to quit: 05/17/1998    Years since quitting: 20.1  . Smokeless tobacco: Never Used  . Tobacco comment: off and on - maybe 5 cigarettes  Substance Use Topics  . Alcohol use: No    Alcohol/week: 0.0 standard drinks    Comment: previously but not currently  . Drug use: No     Office Visit from 06/26/2018 in [facility]  AUDIT-C Score  0      Interim medical history since last visit reviewed. Allergies and medications reviewed  Review of Systems Per HPI unless specifically indicated above     Objective:    BP 138/80   Pulse 94   Temp 98 F (36.7 C)   Ht 6\' 2"  (1.88 m)   Wt 262 lb 3.2 oz (118.9 kg)   SpO2 97%   BMI 33.66 kg/m   Wt Readings from Last 3 Encounters:  06/26/18 262 lb  3.2 oz (118.9 kg)  06/06/18 267 lb 9.6 oz (121.4 kg)  04/17/18 260 lb 14.4 oz (118.3 kg)    Physical Exam Constitutional:      General: He is not in acute distress.    Appearance: He is well-developed. He is obese.  HENT:     Head: Normocephalic and atraumatic.  Eyes:     General: No scleral icterus. Neck:     Thyroid: No thyromegaly.  Cardiovascular:     Rate and Rhythm: Normal rate and regular rhythm.  Pulmonary:     Effort: Pulmonary effort is normal. No tachypnea, accessory muscle usage or prolonged expiration.     Breath sounds: Wheezing (few very faint expiratory wheezes) present. No decreased breath sounds or rhonchi.  Abdominal:     General: Bowel sounds are normal. There is no distension.     Palpations: Abdomen is soft.  Skin:    General: Skin is warm and dry.     Coloration: Skin is not pale.  Neurological:     Mental Status: He is alert.     Coordination: Coordination normal.  Psychiatric:        Behavior: Behavior normal.        Thought Content: Thought content normal.        Judgment: Judgment normal.     Results for orders placed or performed in visit on 12/23/17  Vitamin B12  Result Value Ref Range   Vitamin B-12 782 232 - 1,245 pg/mL  VITAMIN D 25 Hydroxy (Vit-D Deficiency, Fractures)  Result Value Ref Range   Vit D, 25-Hydroxy 32.6 30.0 - 100.0 ng/mL  TSH  Result Value Ref Range   TSH 1.490 0.450 - 4.500 uIU/mL  Lipid panel  Result Value Ref Range   Cholesterol, Total 143 100 - 199 mg/dL   Triglycerides 71 0 - 149 mg/dL   HDL 40 >39 mg/dL   VLDL Cholesterol Cal 14 5 - 40 mg/dL   LDL Calculated 89 0 - 99 mg/dL   Chol/HDL Ratio 3.6 0.0 - 5.0 ratio  Hemoglobin A1c  Result Value Ref Range   Hgb A1c MFr Bld 6.4 (H) 4.8 - 5.6 %   Est. average glucose  Bld gHb Est-mCnc 137 mg/dL  Comprehensive metabolic panel  Result Value Ref Range   Glucose 106 (H) 65 - 99 mg/dL   BUN 17 8 - 27 mg/dL   Creatinine, Ser 1.29 (H) 0.76 - 1.27 mg/dL   GFR calc non  Af Amer 60 >59 mL/min/1.73   GFR calc Af Amer 69 >59 mL/min/1.73   BUN/Creatinine Ratio 13 10 - 24   Sodium 139 134 - 144 mmol/L   Potassium 4.7 3.5 - 5.2 mmol/L   Chloride 103 96 - 106 mmol/L   CO2 21 20 - 29 mmol/L   Calcium 9.6 8.6 - 10.2 mg/dL   Total Protein 6.9 6.0 - 8.5 g/dL   Albumin 4.1 3.6 - 4.8 g/dL   Globulin, Total 2.8 1.5 - 4.5 g/dL   Albumin/Globulin Ratio 1.5 1.2 - 2.2   Bilirubin Total 0.4 0.0 - 1.2 mg/dL   Alkaline Phosphatase 64 39 - 117 IU/L   AST 16 0 - 40 IU/L   ALT 7 0 - 44 IU/L      Assessment & Plan:   Problem List Items Addressed This Visit      Cardiovascular and Mediastinum   Benign essential HTN (Chronic)    Fair control, try to lose weight, DASH guidelines; usually up first thing he says; monitor BP and let me know if not under 130 to adjust medicine        Respiratory   Sleep apnea (Chronic)    Using CPAP      Relevant Orders   CBC     Endocrine   DM type 2, uncontrolled, with neuropathy ([place])    Foot exam by MD today; check labs      Relevant Orders   Lipid panel   Hemoglobin A1c   COMPLETE METABOLIC PANEL WITH GFR     Other   ED (erectile dysfunction)   Relevant Orders   Ambulatory referral to Urology   Ventral hernia without obstruction or gangrene    Refer back to surgeon      Obesity (BMI 30.0-34.9)    praqise given for some pounds off; keep it up      Medication monitoring encounter    Check liver and kidneys      Relevant Orders   COMPLETE METABOLIC PANEL WITH GFR   Hyperlipidemia (Chronic)    Try to limit saturated fats; check lipids today; continue statin      Relevant Orders   Lipid panel    Other Visit Diagnoses    Cough    -  Primary   start Flovent; use SABA if needed; sending for CXR   Relevant Medications   fluticasone (FLOVENT HFA) 110 MCG/ACT inhaler   albuterol (PROAIR HFA) 108 (90 Base) MCG/ACT inhaler   Other Relevant Orders   DG Chest 2 View (Completed)   Screening for prostate cancer        patient does not want a male to do his prostate exam   Relevant Orders   Ambulatory referral to Urology   Chest discomfort       upper chest, midline, relieved with burping; pt avers not heart; will start PPI; reassess in 4 weeks; consider EGD; he denies food sticking; CXR ordered       Follow up plan: Return in about 4 weeks (around 07/24/2018) for follow-up visit with Dr. Sanda Klein.  An after-visit summary was printed and given to the patient at [place].  Please see the patient instructions which may contain other information and  recommendations beyond what is mentioned above in the assessment and plan.  Meds ordered this encounter  Medications  . fluticasone (FLOVENT HFA) 110 MCG/ACT inhaler    Sig: Inhale 2 puffs into the lungs 2 (two) times daily. Rinse mouth out and gargle after 2nd inhalation    Dispense:  1 Inhaler    Refill:  12  . albuterol (PROAIR HFA) 108 (90 Base) MCG/ACT inhaler    Sig: Inhale 2 puffs into the lungs every 4 (four) hours as needed for wheezing or shortness of breath.    Dispense:  1 Inhaler    Refill:  1  . omeprazole (PRILOSEC) 20 MG capsule    Sig: Take 1 capsule (20 mg total) by mouth daily.    Dispense:  30 capsule    Refill:  0    Orders Placed This Encounter  Procedures  . DG Chest 2 View  . Lipid panel  . Hemoglobin A1c  . COMPLETE METABOLIC PANEL WITH GFR  . CBC  . Ambulatory referral to Urology

## 2018-06-26 NOTE — Assessment & Plan Note (Signed)
Fair control, try to lose weight, DASH guidelines; usually up first thing he says; monitor BP and let me know if not under 130 to adjust medicine

## 2018-06-26 NOTE — Assessment & Plan Note (Signed)
On CXR; cough x 1 month; chest discomfort; ordering chest CT

## 2018-06-26 NOTE — Patient Instructions (Addendum)
Start the new inhaler twice a day every day (Flovent) Use the rescue inhaler (Proair) just if needed for tightness or wheezing or coughing Start back on omeprazole If symptoms not 100% resolved, we'll have you see a pulmonologist for breathing/cough issues and/or GI for upper chest symptoms  We'll have you see the urologist  Check out the information at familydoctor.org entitled "Nutrition for Weight Loss: What You Need to Know about Fad Diets" Try to lose between 1-2 pounds per week by taking in fewer calories and burning off more calories You can succeed by limiting portions, limiting foods dense in calories and fat, becoming more active, and drinking 8 glasses of water a day (64 ounces) Don't skip meals, especially breakfast, as skipping meals may alter your metabolism Do not use over-the-counter weight loss pills or gimmicks that claim rapid weight loss A healthy BMI (or body mass index) is between 18.5 and 24.9 You can calculate your ideal BMI at the NIH website ClubMonetize.fr  Try to limit saturated fats in your diet (bologna, hot dogs, barbeque, cheeseburgers, hamburgers, steak, bacon, sausage, cheese, etc.) and get more fresh fruits, vegetables, and whole grains  Try to follow the DASH guidelines (DASH stands for Dietary Approaches to Stop Hypertension). Try to limit the sodium in your diet to no more than 1,500mg  of sodium per day. Certainly try to not exceed 2,000 mg per day at the very most. Do not add salt when cooking or at the table.  Check the sodium amount on labels when shopping, and choose items lower in sodium when given a choice. Avoid or limit foods that already contain a lot of sodium. Eat a diet rich in fruits and vegetables and whole grains, and try to lose weight if overweight or obese

## 2018-06-26 NOTE — Progress Notes (Signed)
Marvin Duncan, please let the patient know that there was no evidence of pneumonia on his chest xray. There is mild "atelectasis" on the left side at the bottom which can be from incomplete inflation of the lungs. I don't have a reason for that. Since he has had this cough long enough, let's get a chest CT to evaluate. I'll order. Thank you

## 2018-06-27 ENCOUNTER — Other Ambulatory Visit: Payer: Self-pay | Admitting: Family Medicine

## 2018-06-27 LAB — LIPID PANEL
Cholesterol: 144 mg/dL (ref <200)
HDL: 42 mg/dL (ref >=40)
LDL Cholesterol (Calc): 83 mg/dL (calc)
Non-HDL Cholesterol (Calc): 102 mg/dL (calc) (ref <130)
Total CHOL/HDL Ratio: 3.4 (calc) (ref <5.0)
Triglycerides: 93 mg/dL (ref <150)

## 2018-06-27 LAB — COMPLETE METABOLIC PANEL WITH GFR
AG Ratio: 1.3 (calc) (ref 1.0–2.5)
ALT: 8 U/L — ABNORMAL LOW (ref 9–46)
AST: 13 U/L (ref 10–35)
Albumin: 4 g/dL (ref 3.6–5.1)
Alkaline phosphatase (APISO): 57 U/L (ref 35–144)
BUN: 15 mg/dL (ref 7–25)
CO2: 22 mmol/L (ref 20–32)
CREATININE: 1.15 mg/dL (ref 0.70–1.25)
Calcium: 9.5 mg/dL (ref 8.6–10.3)
Chloride: 104 mmol/L (ref 98–110)
GFR, EST NON AFRICAN AMERICAN: 68 mL/min/{1.73_m2} (ref >=60)
GFR, Est African American: 79 mL/min/{1.73_m2} (ref >=60)
GLOBULIN: 3.1 g/dL (ref 1.9–3.7)
Glucose, Bld: 109 mg/dL — ABNORMAL HIGH (ref 65–99)
Potassium: 4.3 mmol/L (ref 3.5–5.3)
SODIUM: 137 mmol/L (ref 135–146)
Total Bilirubin: 0.5 mg/dL (ref 0.2–1.2)
Total Protein: 7.1 g/dL (ref 6.1–8.1)

## 2018-06-27 LAB — HEMOGLOBIN A1C
HEMOGLOBIN A1C: 6.6 %{Hb} — AB (ref <5.7)
MEAN PLASMA GLUCOSE: 143 (calc)
eAG (mmol/L): 7.9 (calc)

## 2018-06-27 LAB — CBC
HCT: 42.8 % (ref 38.5–50.0)
Hemoglobin: 14.4 g/dL (ref 13.2–17.1)
MCH: 29.1 pg (ref 27.0–33.0)
MCHC: 33.6 g/dL (ref 32.0–36.0)
MCV: 86.6 fL (ref 80.0–100.0)
MPV: 10.2 fL (ref 7.5–12.5)
Platelets: 270 10*3/uL (ref 140–400)
RBC: 4.94 10*6/uL (ref 4.20–5.80)
RDW: 11.2 % (ref 11.0–15.0)
WBC: 5.7 10*3/uL (ref 3.8–10.8)

## 2018-06-27 MED ORDER — ROSUVASTATIN CALCIUM 20 MG PO TABS: 20.0000 mg | ORAL_TABLET | Freq: Every day | ORAL | 1 refills | Status: DC

## 2018-07-09 ENCOUNTER — Other Ambulatory Visit: Payer: Self-pay | Admitting: Family Medicine

## 2018-07-09 NOTE — Telephone Encounter (Signed)
Rx request for pravastatin DENIED His list shows rosuvastatin

## 2018-07-11 ENCOUNTER — Other Ambulatory Visit: Payer: Self-pay | Admitting: Family Medicine

## 2018-07-11 NOTE — Telephone Encounter (Signed)
Copied from [place] [id number]. Topic: Quick Communication - Rx Refill/Question >> Jul 11, 2018 12:07 PM Reyne Dumas L wrote: Medication:  metFORMIN (GLUCOPHAGE) 1000 MG tablet losartan (COZAAR) 100 MG tablet JARDIANCE 10 MG TABS tablet   Pt states that he is switching insurance companies and because of that he must use new pharmacy.  New pharmacy states they  need new script and they can not transfer RX for pt.  Has the patient contacted their pharmacy? yes (Agent: If no, request that the patient contact the pharmacy for the refill.) (Agent: If yes, when and what did the pharmacy advise?)  Preferred Pharmacy (with phone number or street name): [id number] [name], [place] [phone] (Phone) [phone] (Fax)  Agent: Please be advised that RX refills may take up to 3 business days. We ask that you follow-up with your pharmacy.

## 2018-07-12 MED ORDER — EMPAGLIFLOZIN 10 MG PO TABS: 10.0000 mg | ORAL_TABLET | Freq: Every day | ORAL | 1 refills | Status: DC

## 2018-07-12 MED ORDER — LOSARTAN POTASSIUM 100 MG PO TABS: 100.0000 mg | ORAL_TABLET | Freq: Every day | ORAL | 1 refills | Status: DC

## 2018-07-12 MED ORDER — METFORMIN HCL 1000 MG PO TABS: 1000.0000 mg | ORAL_TABLET | Freq: Two times a day (BID) | ORAL | 1 refills | Status: DC

## 2018-07-12 NOTE — Telephone Encounter (Signed)
Lab Results  Component Value Date   CREATININE 1.15 06/26/2018   Lab Results  Component Value Date   K 4.3 06/26/2018   Lab Results  Component Value Date   HGBA1C 6.6 (H) 06/26/2018   If he cannot use the old pharmacy, please remove it New Rxs sent to [facility]

## 2018-07-21 ENCOUNTER — Ambulatory Visit: Payer: 59 | Admitting: Urology

## 2018-07-21 ENCOUNTER — Encounter: Payer: Self-pay | Admitting: Urology

## 2018-07-21 VITALS — BP 134/79 | HR 81 | Ht 73.5 in | Wt 266.8 lb

## 2018-07-21 DIAGNOSIS — R350 Frequency of micturition: Secondary | ICD-10-CM

## 2018-07-21 DIAGNOSIS — N5201 Erectile dysfunction due to arterial insufficiency: Secondary | ICD-10-CM | POA: Diagnosis not present

## 2018-07-21 DIAGNOSIS — N401 Enlarged prostate with lower urinary tract symptoms: Secondary | ICD-10-CM | POA: Insufficient documentation

## 2018-07-21 MED ORDER — TADALAFIL 20 MG PO TABS: 20.0000 mg | ORAL_TABLET | Freq: Every day | ORAL | 0 refills | Status: DC | PRN

## 2018-07-21 NOTE — Patient Instructions (Signed)
Erectile Dysfunction  Erectile dysfunction (ED) is the inability to get or keep an erection in order to have sexual intercourse. Erectile dysfunction may include:   Inability to get an erection.   Lack of enough hardness of the erection to allow penetration.   Loss of the erection before sex is finished.  What are the causes?  This condition may be caused by:   Certain medicines, such as:  ? Pain relievers.  ? Antihistamines.  ? Antidepressants.  ? Blood pressure medicines.  ? Water pills (diuretics).  ? Ulcer medicines.  ? Muscle relaxants.  ? Drugs.   Excessive drinking.   Psychological causes, such as:  ? Anxiety.  ? Depression.  ? Sadness.  ? Exhaustion.  ? Performance fear.  ? Stress.   Physical causes, such as:  ? Artery problems. This may include diabetes, smoking, liver disease, or atherosclerosis.  ? High blood pressure.  ? Hormonal problems, such as low testosterone.  ? Obesity.  ? Nerve problems. This may include back or pelvic injuries, diabetes mellitus, multiple sclerosis, or Parkinson disease.  What are the signs or symptoms?  Symptoms of this condition include:   Inability to get an erection.   Lack of enough hardness of the erection to allow penetration.   Loss of the erection before sex is finished.   Normal erections at some times, but with frequent unsatisfactory episodes.   Low sexual satisfaction in either partner due to erection problems.   A curved penis occurring with erection. The curve may cause pain or the penis may be too curved to allow for intercourse.   Never having nighttime erections.  How is this diagnosed?  This condition is often diagnosed by:   Performing a physical exam to find other diseases or specific problems with the penis.   Asking you detailed questions about the problem.   Performing blood tests to check for diabetes mellitus or to measure hormone levels.   Performing other tests to check for underlying health conditions.   Performing an ultrasound  exam to check for scarring.   Performing a test to check blood flow to the penis.   Doing a sleep study at home to measure nighttime erections.  How is this treated?  This condition may be treated by:   Medicine taken by mouth to help you achieve an erection (oral medicine).   Hormone replacement therapy to replace low testosterone levels.   Medicine that is injected into the penis. Your health care provider may instruct you how to give yourself these injections at home.   Vacuum pump. This is a pump with a ring on it. The pump and ring are placed on the penis and used to create pressure that helps the penis become erect.   Penile implant surgery. In this procedure, you may receive:  ? An inflatable implant. This consists of cylinders, a pump, and a reservoir. The cylinders can be inflated with a fluid that helps to create an erection, and they can be deflated after intercourse.  ? A semi-rigid implant. This consists of two silicone rubber rods. The rods provide some rigidity. They are also flexible, so the penis can both curve downward in its normal position and become straight for sexual intercourse.   Blood vessel surgery, to improve blood flow to the penis. During this procedure, a blood vessel from a different part of the body is placed into the penis to allow blood to flow around (bypass) damaged or blocked blood vessels.     Lifestyle changes, such as exercising more, losing weight, and quitting smoking.  Follow these instructions at home:  Medicines     Take over-the-counter and prescription medicines only as told by your health care provider. Do not increase the dosage without first discussing it with your health care provider.   If you are using self-injections, perform injections as directed by your health care provider. Make sure to avoid any veins that are on the surface of the penis. After giving an injection, apply pressure to the injection site for 5 minutes.  General  instructions   Exercise regularly, as directed by your health care provider. Work with your health care provider to lose weight, if needed.   Do not use any products that contain nicotine or tobacco, such as cigarettes and e-cigarettes. If you need help quitting, ask your health care provider.   Before using a vacuum pump, read the instructions that come with the pump and discuss any questions with your health care provider.   Keep all follow-up visits as told by your health care provider. This is important.  Contact a health care provider if:   You feel nauseous.   You vomit.  Get help right away if:   You are taking oral or injectable medicines and you have an erection that lasts longer than 4 hours. If your health care provider is unavailable, go to the nearest emergency room for evaluation. An erection that lasts much longer than 4 hours can result in permanent damage to your penis.   You have severe pain in your groin or abdomen.   You develop redness or severe swelling of your penis.   You have redness spreading up into your groin or lower abdomen.   You are unable to urinate.   You experience chest pain or a rapid heart beat (palpitations) after taking oral medicines.  Summary   Erectile dysfunction (ED) is the inability to get or keep an erection during sexual intercourse. This problem can usually be treated successfully.   This condition is diagnosed based on a physical exam, your symptoms, and tests to determine the cause. Treatment varies depending on the cause, and may include medicines, hormone therapy, surgery, or vacuum pump.   You may need follow-up visits to make sure that you are using your medicines or devices correctly.   Get help right away if you are taking or injecting medicines and you have an erection that lasts longer than 4 hours.  This information is not intended to replace advice given to you by your health care provider. Make sure you discuss any questions you have with  your health care provider.  Document Released: 04/30/2000 Document Revised: 05/19/2016 Document Reviewed: 05/19/2016  Elsevier Interactive Patient Education  2019 [employer]

## 2018-07-21 NOTE — Progress Notes (Signed)
07/21/2018 3:02 PM   Marvin Sauger Sr. 1956/04/09 [id number]  Referring provider: Arnetha Courser, MD [address] [place], [place] [address]  Chief Complaint  Patient presents with  . Erectile Dysfunction    HPI: Marvin Duncan is a 63 year old male seen in consultation at the request of Dr. Sanda Klein for evaluation of erectile dysfunction.  He presents with a 5-year history of erectile dysfunction.  He was diagnosed with diabetes in 2000.  Other organic risk factors include hypertension, antihypertensive medication and a history of tobacco use.  He quit in 2000 and only had a 6 pack year tobacco history.  He was initially started on Viagra which was effective but had progressive worsening ED.  He is presently on generic sildenafil which he states is partially effective.  Without medication he has erections that are < 30% of normal with inability to penetrate.  With sildenafil he estimates his erections at 40% of normal with occasional ability to penetrate.  He denies pain or curvature with erections.  He has good libido.  His prostate cancer screening is up-to-date and last PSA May 2019 was 0.4.  He has mild lower urinary tract symptoms of urgency and postvoid dribbling.   PMH: Past Medical History:  Diagnosis Date  . Decreased libido   . History of nuclear stress test    a. 05/2014: no evidence of significant ischemia, GI uptake was noted, EF 51%, no ECG changes concerning for ischemia, normal study  . HTN (hypertension)   . Hyperlipidemia   . Hypertension   . IDDM (insulin dependent diabetes mellitus) ([place])   . Obesity   . Pharyngitis   . Sleep apnea    uses CPAP    Surgical History: Past Surgical History:  Procedure Laterality Date  . AV FISTULA REPAIR    . CARDIAC CATHETERIZATION     [facility]   . COLONOSCOPY  2013    Home Medications:  Allergies as of 07/21/2018      Reactions   Atorvastatin Other (See Comments)   Statins cause headaches and muscle aches      Medication List       Accurate as of July 21, 2018  3:02 PM. Always use your most recent med list.        albuterol 108 (90 Base) MCG/ACT inhaler Commonly known as:  ProAir HFA Inhale 2 puffs into the lungs every 4 (four) hours as needed for wheezing or shortness of breath.   amLODipine 10 MG tablet Commonly known as:  NORVASC TAKE 1 TABLET BY MOUTH  DAILY   aspirin 81 MG tablet Take 81 mg by mouth daily.   Dulaglutide 1.5 MG/0.5ML Sopn Commonly known as:  Trulicity Inject contents of one syringe once a week   empagliflozin 10 MG Tabs tablet Commonly known as:  Jardiance Take 10 mg by mouth daily.   Fish Oil 1000 MG Caps Take 1,000 mg by mouth daily.   Flaxseed Oil 1000 MG Caps Take 1,000 mg by mouth daily.   fluticasone 110 MCG/ACT inhaler Commonly known as:  Flovent HFA Inhale 2 puffs into the lungs 2 (two) times daily. Rinse mouth out and gargle after 2nd inhalation   fluticasone 50 MCG/ACT nasal spray Commonly known as:  FLONASE USE 2 SPRAYS IN EACH  NOSTRIL DAILY (IF NEEDED  FOR ALLERGY SYMPTOMS)   gabapentin 300 MG capsule Commonly known as:  NEURONTIN TAKE 1 CAPSULE BY MOUTH AT  BEDTIME   glipiZIDE 10 MG tablet Commonly known as:  GLUCOTROL Take 1 tablet (10 mg total) by mouth 2 (two) times daily before a meal. For high sugars   Insulin Pen Needle 32G X 6 MM Misc 1 Package by Does not apply route once.   losartan 100 MG tablet Commonly known as:  COZAAR Take 1 tablet (100 mg total) by mouth daily.   metFORMIN 1000 MG tablet Commonly known as:  GLUCOPHAGE Take 1 tablet (1,000 mg total) by mouth 2 (two) times daily with a meal.   omeprazole 20 MG capsule Commonly known as:  PRILOSEC Take 1 capsule (20 mg total) by mouth daily.   rosuvastatin 20 MG tablet Commonly known as:  Crestor Take 1 tablet (20 mg total) by mouth daily. For cholesterol; this replaces pravastatin   sildenafil 20 MG tablet Commonly known as:  REVATIO TAKE 2 TO 5  TABLETS BY MOUTH ONCE PRIOR TO INTIMACY ONCE A DAY   traZODone 100 MG tablet Commonly known as:  DESYREL TAKE 1 TABLET BY MOUTH AT  BEDTIME AS NEEDED FOR SLEEP   vitamin B-12 1000 MCG tablet Commonly known as:  CYANOCOBALAMIN Take 1,000 mcg by mouth daily.   Vitamin D 50 MCG (2000 UT) tablet Take 2,000 Units by mouth daily.       Allergies:  Allergies  Allergen Reactions  . Atorvastatin Other (See Comments)    Statins cause headaches and muscle aches    Family History: Family History  Problem Relation Age of Onset  . Breast cancer Mother   . Cancer Mother   . Kidney failure Father   . Diabetes Father   . Alcohol abuse Father   . Diabetes Sister   . Other Daughter     Social History:  reports that he quit smoking about 20 years ago. His smoking use included cigarettes. He has a 6.25 pack-year smoking history. He has never used smokeless tobacco. He reports that he does not drink alcohol or use drugs.  ROS: UROLOGY Frequent Urination?: No Hard to postpone urination?: Yes Burning/pain with urination?: No Get up at night to urinate?: Yes Leakage of urine?: Yes Urine stream starts and stops?: No Trouble starting stream?: No Do you have to strain to urinate?: No Blood in urine?: No Urinary tract infection?: No Sexually transmitted disease?: No Injury to kidneys or bladder?: No Painful intercourse?: No Weak stream?: No Erection problems?: Yes Penile pain?: Yes  Gastrointestinal Nausea?: No Vomiting?: No Indigestion/heartburn?: No Diarrhea?: No Constipation?: No  Constitutional Fever: No Night sweats?: No Weight loss?: No Fatigue?: No  Skin Skin rash/lesions?: No Itching?: No  Eyes Blurred vision?: No Double vision?: No  Ears/Nose/Throat Sore throat?: Yes Sinus problems?: Yes  Hematologic/Lymphatic Swollen glands?: No Easy bruising?: No  Cardiovascular Leg swelling?: No Chest pain?: Yes  Respiratory Cough?: Yes Shortness of breath?:  No  Endocrine Excessive thirst?: No  Musculoskeletal Back pain?: Yes Joint pain?: No  Neurological Headaches?: No Dizziness?: Yes  Psychologic Depression?: No Anxiety?: No  Physical Exam: BP 134/79 (BP Location: Left Arm, Patient Position: Sitting, Cuff Size: Large)   Pulse 81   Ht 6' 1.5" (1.867 m)   Wt 266 lb 12.8 oz (121 kg)   BMI 34.72 kg/m   Constitutional:  Alert and oriented, No acute distress. HEENT: [place] AT, moist mucus membranes.  Trachea midline, no masses. Cardiovascular: No clubbing, cyanosis, or edema. Respiratory: Normal respiratory effort, no increased work of breathing. GI: Abdomen is soft, nontender, nondistended, no abdominal masses GU: No CVA tenderness.  Penis without lesions or plaques, testes descended  bilaterally without masses or tenderness.  Testes 20 cc bilaterally.  Cord/epididymis palpably normal bilaterally.  Prostate 40 g, smooth without nodules. Lymph: No cervical or inguinal lymphadenopathy. Skin: No rashes, bruises or suspicious lesions. Neurologic: Grossly intact, no focal deficits, moving all 4 extremities. Psychiatric: Normal mood and affect.   Assessment & Plan:   63 year old male with erectile dysfunction and significant organic risk factors.  Although efficacy of PDE 5 inhibitors is generally equivalent across the board I did offer him a trial of a different medication in that class.  Additional alternatives of vacuum erection devices and intracavernosal injections were also reviewed and he was provided literature.  He was initially interested and another medication trial and given an Rx tadalafil 20 mg 1 hour prior to intercourse.  If not effective he will call back to look at other options.   Abbie Sons, [place] [address], [place], [place] [address] [phone]

## 2018-07-24 ENCOUNTER — Ambulatory Visit
Admission: RE | Admit: 2018-07-24 | Discharge: 2018-07-24 | Disposition: A | Discharge status: Home / Self Care | Payer: 59 | Source: Ambulatory Visit | Attending: Family Medicine | Admitting: Family Medicine

## 2018-07-24 DIAGNOSIS — J9811 Atelectasis: Secondary | ICD-10-CM | POA: Insufficient documentation

## 2018-07-24 DIAGNOSIS — R0789 Other chest pain: Secondary | ICD-10-CM

## 2018-07-24 DIAGNOSIS — R05 Cough: Secondary | ICD-10-CM | POA: Diagnosis present

## 2018-07-24 DIAGNOSIS — R059 Cough, unspecified: Secondary | ICD-10-CM

## 2018-07-24 MED ORDER — IOHEXOL 300 MG/ML  SOLN
75.0000 mL | Freq: Once | INTRAMUSCULAR | Status: AC | PRN
  Administered 2018-07-24: 75 mL via INTRAVENOUS

## 2018-07-26 ENCOUNTER — Encounter: Payer: Self-pay | Admitting: Family Medicine

## 2018-07-26 ENCOUNTER — Other Ambulatory Visit: Payer: Self-pay | Admitting: Family Medicine

## 2018-07-26 DIAGNOSIS — D497 Neoplasm of unspecified behavior of endocrine glands and other parts of nervous system: Secondary | ICD-10-CM | POA: Insufficient documentation

## 2018-07-26 HISTORY — DX: Neoplasm of unspecified behavior of endocrine glands and other parts of nervous system: D49.7

## 2018-07-26 NOTE — Progress Notes (Signed)
Refer to gen surg

## 2018-07-31 ENCOUNTER — Ambulatory Visit (INDEPENDENT_AMBULATORY_CARE_PROVIDER_SITE_OTHER): Payer: 59 | Admitting: General Surgery

## 2018-07-31 ENCOUNTER — Encounter: Payer: Self-pay | Admitting: General Surgery

## 2018-07-31 ENCOUNTER — Other Ambulatory Visit: Payer: Self-pay

## 2018-07-31 VITALS — BP 144/81 | HR 99 | Temp 95.9°F | Ht 73.5 in | Wt 267.2 lb

## 2018-07-31 DIAGNOSIS — E1140 Type 2 diabetes mellitus with diabetic neuropathy, unspecified: Secondary | ICD-10-CM | POA: Diagnosis not present

## 2018-07-31 DIAGNOSIS — D497 Neoplasm of unspecified behavior of endocrine glands and other parts of nervous system: Secondary | ICD-10-CM

## 2018-07-31 DIAGNOSIS — IMO0002 Reserved for concepts with insufficient information to code with codable children: Secondary | ICD-10-CM

## 2018-07-31 DIAGNOSIS — E1165 Type 2 diabetes mellitus with hyperglycemia: Secondary | ICD-10-CM

## 2018-07-31 NOTE — Patient Instructions (Addendum)
Patient will need to have a adrenal protocol ct scan with contrast, Dr.Cannon will call the patient with the results.    Call the office with any questions or concerns.   You will need to have your labs drawn today.  You are scheduled for a CT abdomen w/wo contrast at [facility] on 08/02/18 at 11 am. You will arrive by 10:45 am and have nothing to eat or drink for 4 hours prior.   We will call you with the results.

## 2018-07-31 NOTE — Progress Notes (Signed)
Patient ID: Marvin Bellis., male   DOB: November 18, 1955, 63 y.o.   MRN: [id number]  Chief Complaint  Patient presents with  . Follow-up     seen in 2018 [facility] ref Dr.Lada adrenal tumor    HPI Marvin Duncan. is a 63 y.o. male.   He was referred by his primary care provider, Dr. Varney Biles, for further evaluation of an adrenal mass.  He underwent a chest CT for evaluation of a cough and the lower cuts captured a left adrenal mass.  It measured 2.4 cm and had Hounsfield units of 22.  The interpreting radiologist suggested that this might represent a myelolipoma, however the density is actually indeterminate and requires further evaluation. Marvin Duncan states that he would not have been aware of the mass had not been discovered on CT imaging.  He does endorse occasional palpitations as well as surges of heat and flushing.  He denies syncope or near syncope.  He has hypertension but is only taking 1 medication for blood pressure control.  He is diabetic, but states that his glucose control is actually improving.  He denies facial swelling or centripetal weight gain.  He does not have any significant nocturia or polyuria.  He states that he has never had low potassium.  He denies any known family history of multiple endocrine neoplasia syndromes, von Hippel-Lindau syndrome, or other heritable endocrinopathies.  He was seen previously in this office, by Dr. Jamal Collin, for a possible ventral hernia.  This was determined to be diastasis rectus.  He does have a small umbilical hernia that is asymptomatic.   Past Medical History:  Diagnosis Date  . Adrenal tumor 07/26/2018  . Decreased libido   . History of nuclear stress test    a. 05/2014: no evidence of significant ischemia, GI uptake was noted, EF 51%, no ECG changes concerning for ischemia, normal study  . HTN (hypertension)   . Hyperlipidemia   . Hypertension   . IDDM (insulin dependent diabetes mellitus) ([place])   . Obesity   . Pharyngitis   . Sleep  apnea    uses CPAP    Past Surgical History:  Procedure Laterality Date  . AV FISTULA REPAIR    . CARDIAC CATHETERIZATION     [facility]   . COLONOSCOPY  2013    Family History  Problem Relation Age of Onset  . Breast cancer Mother   . Cancer Mother   . Kidney failure Father   . Diabetes Father   . Alcohol abuse Father   . Diabetes Sister   . Other Daughter     Social History Social History   Tobacco Use  . Smoking status: Former Smoker    Packs/day: 0.25    Years: 25.00    Pack years: 6.25    Types: Cigarettes    Last attempt to quit: 05/17/1998    Years since quitting: 20.2  . Smokeless tobacco: Never Used  . Tobacco comment: off and on - maybe 5 cigarettes  Substance Use Topics  . Alcohol use: No    Alcohol/week: 0.0 standard drinks    Comment: previously but not currently  . Drug use: No    Allergies  Allergen Reactions  . Atorvastatin Other (See Comments)    Statins cause headaches and muscle aches    Current Outpatient Medications  Medication Sig Dispense Refill  . albuterol (PROAIR HFA) 108 (90 Base) MCG/ACT inhaler Inhale 2 puffs into the lungs every 4 (four) hours as needed for  wheezing or shortness of breath. 1 Inhaler 1  . amLODipine (NORVASC) 10 MG tablet TAKE 1 TABLET BY MOUTH  DAILY 90 tablet 3  . aspirin 81 MG tablet Take 81 mg by mouth daily.    . Cholecalciferol (VITAMIN D) 2000 UNITS tablet Take 2,000 Units by mouth daily.    . Dulaglutide (TRULICITY) 1.5 TF/5.7DU SOPN Inject contents of one syringe once a week 15 pen 1  . empagliflozin (JARDIANCE) 10 MG TABS tablet Take 10 mg by mouth daily. 90 tablet 1  . Flaxseed, Linseed, (FLAXSEED OIL) 1000 MG CAPS Take 1,000 mg by mouth daily.     . fluticasone (FLONASE) 50 MCG/ACT nasal spray USE 2 SPRAYS IN EACH  NOSTRIL DAILY (IF NEEDED  FOR ALLERGY SYMPTOMS) 48 g 0  . fluticasone (FLOVENT HFA) 110 MCG/ACT inhaler Inhale 2 puffs into the lungs 2 (two) times daily. Rinse mouth out and gargle after 2nd  inhalation 1 Inhaler 12  . gabapentin (NEURONTIN) 300 MG capsule TAKE 1 CAPSULE BY MOUTH AT  BEDTIME 90 capsule 3  . glipiZIDE (GLUCOTROL) 10 MG tablet Take 1 tablet (10 mg total) by mouth 2 (two) times daily before a meal. For high sugars 180 tablet 1  . Insulin Pen Needle 32G X 6 MM MISC 1 Package by Does not apply route once. 100 each 3  . losartan (COZAAR) 100 MG tablet Take 1 tablet (100 mg total) by mouth daily. 90 tablet 1  . metFORMIN (GLUCOPHAGE) 1000 MG tablet Take 1 tablet (1,000 mg total) by mouth 2 (two) times daily with a meal. 180 tablet 1  . Omega-3 Fatty Acids (FISH OIL) 1000 MG CAPS Take 1,000 mg by mouth daily.    Marland Kitchen omeprazole (PRILOSEC) 20 MG capsule Take 1 capsule (20 mg total) by mouth daily. 30 capsule 0  . rosuvastatin (CRESTOR) 20 MG tablet Take 1 tablet (20 mg total) by mouth daily. For cholesterol; this replaces pravastatin 30 tablet 1  . sildenafil (REVATIO) 20 MG tablet TAKE 2 TO 5 TABLETS BY MOUTH ONCE PRIOR TO INTIMACY ONCE A DAY 20 tablet 5  . tadalafil (ADCIRCA/CIALIS) 20 MG tablet Take 1 tablet (20 mg total) by mouth daily as needed for erectile dysfunction. 30 tablet 0  . traZODone (DESYREL) 100 MG tablet TAKE 1 TABLET BY MOUTH AT  BEDTIME AS NEEDED FOR SLEEP 90 tablet 3  . vitamin B-12 (CYANOCOBALAMIN) 1000 MCG tablet Take 1,000 mcg by mouth daily.     No current facility-administered medications for this visit.     Review of Systems Review of Systems  All other systems reviewed and are negative. or per the HPI  Blood pressure (!) 144/81, pulse 99, temperature (!) 95.9 F (35.5 C), temperature source Temporal, height 6' 1.5" (1.867 m), weight 267 lb 3.2 oz (121.2 kg), SpO2 97 %.  Physical Exam Physical Exam Vitals signs reviewed.  Constitutional:      General: He is not in acute distress.    Appearance: He is obese. He is not ill-appearing.  HENT:     Head: Normocephalic and atraumatic.     Comments: No facial plethora. No increased dorsocervical  fat pad.     Nose: No congestion.     Mouth/Throat:     Mouth: Mucous membranes are moist.     Pharynx: Oropharynx is clear. No oropharyngeal exudate or posterior oropharyngeal erythema.  Eyes:     General: No scleral icterus.       Right eye: No discharge.  Left eye: No discharge.     Extraocular Movements: Extraocular movements intact.     Pupils: Pupils are equal, round, and reactive to light.  Neck:     Musculoskeletal: Normal range of motion and neck supple.  Cardiovascular:     Rate and Rhythm: Regular rhythm.     Pulses: Normal pulses.  Pulmonary:     Effort: Pulmonary effort is normal.     Breath sounds: Normal breath sounds.  Abdominal:     General: Bowel sounds are normal. There is distension.     Palpations: Abdomen is soft.     Hernia: A hernia is present.     Comments: Protuberance consistent with his level of obesity.  Diastasis rectus present.  Tiny umbilical hernia without incarceration.  No violaceous striae.  Genitourinary:    Comments: Deferred. Musculoskeletal:        General: No swelling or tenderness.  Lymphadenopathy:     Cervical: No cervical adenopathy.  Skin:    General: Skin is warm and dry.  Neurological:     General: No focal deficit present.     Mental Status: He is alert.  Psychiatric:        Mood and Affect: Mood normal.        Behavior: Behavior normal.        Thought Content: Thought content normal.        Judgment: Judgment normal.     Data Reviewed EXAM: CT CHEST WITH CONTRAST  TECHNIQUE: Multidetector CT imaging of the chest was performed during intravenous contrast administration.  CONTRAST:  49mL OMNIPAQUE IOHEXOL 300 MG/ML  SOLN  COMPARISON:  06/26/2018  FINDINGS: Cardiovascular: Thoracic aorta demonstrates atherosclerotic calcifications without significant aneurysmal dilatation. No dissection is seen. No cardiac enlargement is identified. The pulmonary artery as visualized is within normal limits.   Mediastinum/Nodes: The esophagus is within normal limits. The thoracic inlet is unremarkable. No hilar or mediastinal adenopathy is noted.  Lungs/Pleura: Lungs are well aerated bilaterally. No focal infiltrate or sizable effusion is seen. No parenchymal nodules are noted.  Upper Abdomen: Visualized upper abdomen demonstrates fatty infiltration of the liver. 2.4 cm hypodense lesion is noted arising from the left adrenal gland. Given the decreased density this may represent a myelolipoma.  Musculoskeletal: Degenerative changes of the thoracic spine are noted. No acute bony abnormality is seen.  IMPRESSION: No acute abnormality in the chest.  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 Follow-up scan in 1 year is recommended to assess for stability.  Fatty liver.  Aortic Atherosclerosis (ICD10-I70.0).     Results for BAYARD, MORE (MRN [id number]) as of 07/31/2018 10:17  Ref. Range 06/26/2018 08:18  Sodium Latest Ref Range: 135 - 146 mmol/L 137  Potassium Latest Ref Range: 3.5 - 5.3 mmol/L 4.3  Chloride Latest Ref Range: 98 - 110 mmol/L 104  CO2 Latest Ref Range: 20 - 32 mmol/L 22  Glucose Latest Ref Range: 65 - 99 mg/dL 109 (H)  Mean Plasma Glucose Latest Units: (calc) 143  BUN Latest Ref Range: 7 - 25 mg/dL 15  Creatinine Latest Ref Range: 0.70 - 1.25 mg/dL 1.15  Calcium Latest Ref Range: 8.6 - 10.3 mg/dL 9.5  BUN/Creatinine Ratio Latest Ref Range: 6 - 22 (calc) NOT APPLICABLE  AG Ratio Latest Ref Range: 1.0 - 2.5 (calc) 1.3  AST Latest Ref Range: 10 - 35 U/L 13  ALT Latest Ref Range: 9 - 46 U/L 8 (L)  Total Protein Latest Ref Range: 6.1 - 8.1 g/dL 7.1  Total Bilirubin Latest Ref Range: 0.2 - 1.2 mg/dL 0.5  GFR, Est Non African American Latest Ref Range: > OR = 60 mL/min/1.65m2 68  GFR, Est African American Latest Ref Range: > OR = 60 mL/min/1.72m2 79  Total CHOL/HDL Ratio Latest Ref Range: <5.0 (calc) 3.4  Cholesterol Latest Ref Range: <200 mg/dL  144  HDL Cholesterol Latest Ref Range: > OR = 40 mg/dL 42  LDL Cholesterol (Calc) Latest Units: mg/dL (calc) 83  Non-HDL Cholesterol (Calc) Latest Ref Range: <130 mg/dL (calc) 102  Triglycerides Latest Ref Range: <150 mg/dL 93  Alkaline phosphatase (APISO) Latest Ref Range: 35 - 144 U/L 57  Globulin Latest Ref Range: 1.9 - 3.7 g/dL (calc) 3.1  WBC Latest Ref Range: 3.8 - 10.8 Thousand/uL 5.7  RBC Latest Ref Range: 4.20 - 5.80 Million/uL 4.94  Hemoglobin Latest Ref Range: 13.2 - 17.1 g/dL 14.4  HCT Latest Ref Range: 38.5 - 50.0 % 42.8  MCV Latest Ref Range: 80.0 - 100.0 fL 86.6  MCH Latest Ref Range: 27.0 - 33.0 pg 29.1  MCHC Latest Ref Range: 32.0 - 36.0 g/dL 33.6  RDW Latest Ref Range: 11.0 - 15.0 % 11.2  Platelets Latest Ref Range: 140 - 400 Thousand/uL 270  MPV Latest Ref Range: 7.5 - 12.5 fL 10.2  eAG (mmol/L) Latest Units: (calc) 7.9  Hemoglobin A1C Latest Ref Range: <5.7 % of total Hgb 6.6 (H)   Imaging reviewed.  Despite statement that it may represent a myelolipoma, the HU are in the indeterminate range and therefore cannot rule out another entity.  Labs show reasonable diabetic control with an A1C of 6.6 and normal potassium levels.  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  The imaging studies show an indeterminate density and therefore additional investigation is required.  He does not have any overt symptoms of a hormone secreting tumor such as difficult to control hypertension/multiple medications, poorly controlled glucose, or any family history of adrenal tumor diseases.  Physical exam findings are not suggestive of any type of syndrome.  Plan We will start with a dedicated adrenal protocol CT scan to better evaluate the imaging characteristics.  If the mass is less than 10 Hounsfield units on noncontrast study, the likelihood that it does represent a myelolipoma is greater.  I discussed with him that myelolipomas are universally  benign and universally nonsecreting, however they can grow with time and that once they are over 6 cm, we recommend surgical resection due to increased risk of internal hemorrhage. if it remains indeterminant, we will proceed with biochemical testing to rule out hormone hypersecretion.  If this is negative, we will plan for a repeat CT scan in 1 years time to evaluate for interval change.  If any of the findings are positive, however we will need to proceed with surgical resection.  We also discussed that in the absence of hormone secretion and should the imaging not be consistent with myelolipoma, we would still want to proceed with surgical resection if the mass grows half a centimeter in 6 months or exceeds 4 cm at any time, due to the increased risk of malignancy with increased size.  I will contact Marvin Duncan by phone once I have the results of the CT scan and plan from there.    Marvin Duncan 07/31/2018, 10:08 AM

## 2018-08-01 DIAGNOSIS — K760 Fatty (change of) liver, not elsewhere classified: Secondary | ICD-10-CM | POA: Insufficient documentation

## 2018-08-01 DIAGNOSIS — I700 Atherosclerosis of aorta: Secondary | ICD-10-CM | POA: Insufficient documentation

## 2018-08-01 LAB — BUN+CREAT
BUN/Creatinine Ratio: 10 (ref 10–24)
BUN: 14 mg/dL (ref 8–27)
Creatinine, Ser: 1.34 mg/dL — ABNORMAL HIGH (ref 0.76–1.27)
GFR calc Af Amer: 66 mL/min/{1.73_m2} (ref >59)
GFR calc non Af Amer: 57 mL/min/{1.73_m2} — ABNORMAL LOW (ref >59)

## 2018-08-01 NOTE — Progress Notes (Signed)
Name: Marvin HASSEBROCK Sr.   MRN: [id number]    DOB: 01-21-1956   Date:08/02/2018       Progress Note  Subjective  Chief Complaint  Chief Complaint  Patient presents with  . Follow-up    HPI  Patient presents for follow-up for one month follow up on chest discomfort- described as upper chest, midline, relieved with burping. 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    Chest discomfort States He took fluticasone inhaler the first few days but stats his coughing stopped a week later. Patient started taking pepcid AC and is taking that every day with relief of acid reflux is not having any symptoms.   Fatty Liver Disease occationally eats fried foods, eats a lot of baked chicken and fish, navy bean soup, vegetables often, home-made beef stew. Eats fruits daily, drinks un sweetened tea, water. Has been working on diet and increasing exercise Wt Readings from Last 3 Encounters:  08/02/18 264 lb (119.7 kg)  07/31/18 267 lb 3.2 oz (121.2 kg)  07/21/18 266 lb 12.8 oz (121 kg)   Atherosclerosis Discussed diet, takes rosuvastatin 20mg  as prescribed. Discussed goal of LDL under 70 patient motivated to do so.  Lab Results  Component Value Date   CHOL 144 06/26/2018   HDL 42 06/26/2018   LDLCALC 83 06/26/2018   TRIG 93 06/26/2018   CHOLHDL 3.4 06/26/2018    Adrenal lesion He is following up with endocrinology surgeon for adrenal lesion- CT abdomen w/wo ordered for today.   PHQ2/9: Depression screen [facility] 2/9 08/02/2018 06/26/2018 06/06/2018 04/17/2018 12/23/2017  Decreased Interest 0 0 0 0 0  Down, Depressed, Hopeless 0 0 0 0 0  PHQ - 2 Score 0 0 0 0 0  Altered sleeping 0 0 0 0 -  Tired, decreased energy 0 0 0 0 -  Change in appetite 0 0 0 0 -  Feeling bad or failure about yourself  0 0 0 0 -  Trouble concentrating 0 0 0 0 -  Moving slowly or fidgety/restless 0 0 0 0 -  Suicidal thoughts  0 0 0 0 -  PHQ-9 Score 0 0 0 0 -  Difficult doing work/chores Not difficult at all Not difficult at all Not difficult at all Not difficult at all -    PHQ reviewed. Negative  Patient Active Problem List   Diagnosis Date Noted  . Fatty liver disease, nonalcoholic 62/83/1517  . Atherosclerosis of abdominal aorta ([place]) 08/01/2018  . Adrenal tumor 07/26/2018  . Benign prostatic hyperplasia with urinary frequency 07/21/2018  . Atelectasis of left lung 06/26/2018  . Obesity (BMI 30.0-34.9) 04/17/2018  . Memory loss or impairment 01/31/2018  . Ventral hernia without obstruction or gangrene 05/13/2016  . Plantar fasciitis of left foot 05/13/2016  . GERD (gastroesophageal reflux disease) 01/04/2016  . Leg cramps 12/26/2015  . Medication monitoring encounter 09/26/2015  . Chest pain with moderate risk for cardiac etiology 05/01/2015  . Drug intolerance 03/06/2015  . DM type 2, uncontrolled, with neuropathy ([place]) 03/06/2015  . Erectile dysfunction due to arterial insufficiency 10/24/2014  . Hyperlipidemia 05/23/2014  . Sleep apnea 05/23/2014  . Benign essential HTN 02/14/2007    Past Medical History:  Diagnosis Date  . Adrenal tumor 07/26/2018  . Decreased libido   . History of nuclear stress test    a. 05/2014: no evidence of significant ischemia, GI uptake was noted, EF 51%, no  ECG changes concerning for ischemia, normal study  . HTN (hypertension)   . Hyperlipidemia   . Hypertension   . IDDM (insulin dependent diabetes mellitus) ([place])   . Obesity   . Pharyngitis   . Sleep apnea    uses CPAP    Past Surgical History:  Procedure Laterality Date  . AV FISTULA REPAIR    . CARDIAC CATHETERIZATION     [facility]   . COLONOSCOPY  2013    Social History   Tobacco Use  . Smoking status: Former Smoker    Packs/day: 0.25    Years: 25.00    Pack years: 6.25    Types: Cigarettes    Last attempt to quit: 05/17/1998    Years since quitting: 20.2  . Smokeless tobacco: Never Used  .  Tobacco comment: off and on - maybe 5 cigarettes  Substance Use Topics  . Alcohol use: No    Alcohol/week: 0.0 standard drinks    Comment: previously but not currently     Current Outpatient Medications:  .  albuterol (PROAIR HFA) 108 (90 Base) MCG/ACT inhaler, Inhale 2 puffs into the lungs every 4 (four) hours as needed for wheezing or shortness of breath., Disp: 1 Inhaler, Rfl: 1 .  amLODipine (NORVASC) 10 MG tablet, TAKE 1 TABLET BY MOUTH  DAILY, Disp: 90 tablet, Rfl: 3 .  aspirin 81 MG tablet, Take 81 mg by mouth daily., Disp: , Rfl:  .  Cholecalciferol (VITAMIN D) 2000 UNITS tablet, Take 2,000 Units by mouth daily., Disp: , Rfl:  .  Dulaglutide (TRULICITY) 1.5 ZY/6.0YT SOPN, Inject contents of one syringe once a week, Disp: 4 pen, Rfl: 4 .  empagliflozin (JARDIANCE) 10 MG TABS tablet, Take 10 mg by mouth daily., Disp: 90 tablet, Rfl: 1 .  Flaxseed, Linseed, (FLAXSEED OIL) 1000 MG CAPS, Take 1,000 mg by mouth daily. , Disp: , Rfl:  .  fluticasone (FLONASE) 50 MCG/ACT nasal spray, USE 2 SPRAYS IN EACH  NOSTRIL DAILY (IF NEEDED  FOR ALLERGY SYMPTOMS), Disp: 48 g, Rfl: 0 .  glipiZIDE (GLUCOTROL) 10 MG tablet, Take 1 tablet (10 mg total) by mouth 2 (two) times daily before a meal. For high sugars, Disp: 180 tablet, Rfl: 1 .  losartan (COZAAR) 100 MG tablet, Take 1 tablet (100 mg total) by mouth daily., Disp: 90 tablet, Rfl: 1 .  metFORMIN (GLUCOPHAGE) 1000 MG tablet, Take 1 tablet (1,000 mg total) by mouth 2 (two) times daily with a meal., Disp: 180 tablet, Rfl: 1 .  Omega-3 Fatty Acids (FISH OIL) 1000 MG CAPS, Take 1,000 mg by mouth daily., Disp: , Rfl:  .  rosuvastatin (CRESTOR) 20 MG tablet, Take 1 tablet (20 mg total) by mouth daily. For cholesterol; this replaces pravastatin, Disp: 30 tablet, Rfl: 1 .  tadalafil (ADCIRCA/CIALIS) 20 MG tablet, Take 1 tablet (20 mg total) by mouth daily as needed for erectile dysfunction., Disp: 30 tablet, Rfl: 0 .  traZODone (DESYREL) 100 MG tablet, TAKE 1  TABLET BY MOUTH AT  BEDTIME AS NEEDED FOR SLEEP, Disp: 90 tablet, Rfl: 3 .  vitamin B-12 (CYANOCOBALAMIN) 1000 MCG tablet, Take 1,000 mcg by mouth daily., Disp: , Rfl:  .  fluticasone (FLOVENT HFA) 110 MCG/ACT inhaler, Inhale 2 puffs into the lungs 2 (two) times daily. Rinse mouth out and gargle after 2nd inhalation (Patient not taking: Reported on 08/02/2018), Disp: 1 Inhaler, Rfl: 12 .  Insulin Pen Needle 32G X 6 MM MISC, 1 Package by Does not apply route once., Disp:  100 each, Rfl: 3 .  omeprazole (PRILOSEC) 20 MG capsule, Take 1 capsule (20 mg total) by mouth daily. (Patient not taking: Reported on 08/02/2018), Disp: 30 capsule, Rfl: 0 .  sildenafil (REVATIO) 20 MG tablet, TAKE 2 TO 5 TABLETS BY MOUTH ONCE PRIOR TO INTIMACY ONCE A DAY (Patient not taking: Reported on 08/02/2018), Disp: 20 tablet, Rfl: 5  Allergies  Allergen Reactions  . Atorvastatin Other (See Comments)    Statins cause headaches and muscle aches    Review of Systems  Constitutional: Negative for chills, fever and malaise/fatigue.  HENT: Positive for congestion. Negative for ear pain, sinus pain and sore throat.   Respiratory: Negative for cough, shortness of breath and wheezing.   Cardiovascular: Negative for chest pain and palpitations.  Gastrointestinal: Negative for abdominal pain, constipation, diarrhea, nausea and vomiting.  Genitourinary: Negative for dysuria and urgency.  Musculoskeletal: Negative for back pain.  Neurological: Negative for dizziness, tingling, weakness and headaches.  Psychiatric/Behavioral: Negative for depression. The patient is not nervous/anxious.      No other specific complaints in a complete review of systems (except as listed in HPI above).  Objective  Vitals:   08/02/18 0823  BP: 130/82  Pulse: 75  Resp: 12  Temp: 98.1 F (36.7 C)  TempSrc: Oral  SpO2: 98%  Weight: 264 lb (119.7 kg)  Height: 6\' 2"  (1.88 m)    Body mass index is 33.9 kg/m.  Nursing Note and Vital Signs  reviewed.  Physical Exam Constitutional:      Appearance: Normal appearance. He is well-developed.  HENT:     Head: Normocephalic and atraumatic.     Right Ear: Hearing normal.     Left Ear: Hearing normal.  Eyes:     Conjunctiva/sclera: Conjunctivae normal.  Cardiovascular:     Rate and Rhythm: Normal rate and regular rhythm.     Heart sounds: Normal heart sounds.  Pulmonary:     Effort: Pulmonary effort is normal.     Breath sounds: Normal breath sounds.  Musculoskeletal: Normal range of motion.  Neurological:     Mental Status: He is alert and oriented to person, place, and time.  Psychiatric:        Speech: Speech normal.        Behavior: Behavior normal. Behavior is cooperative.        Thought Content: Thought content normal.        Judgment: Judgment normal.        Results for orders placed or performed in visit on 07/31/18 (from the past 48 hour(s))  BUN+Creat     Status: Abnormal   Collection Time: 07/31/18 10:45 AM  Result Value Ref Range   BUN 14 8 - 27 mg/dL   Creatinine, Ser 1.34 (H) 0.76 - 1.27 mg/dL   GFR calc non Af Amer 57 (L) >59 mL/min/1.73   GFR calc Af Amer 66 >59 mL/min/1.73   BUN/Creatinine Ratio 10 10 - 24    Assessment & Plan  1. Chest pain with moderate risk for cardiac etiology resolved  2. Mixed hyperlipidemia Discussed diet, continue rosuvastatin   3. Adrenal tumor Follow up with endo surgeon   4. Benign essential HTN Stable continue medications and weight loss   5. Fatty liver disease, nonalcoholic Discussed diet and weight loss   6. Atherosclerosis of abdominal aorta (HCC) Discussed diet and weight loss, BP and lipid control. Daily 81mg  aspirin    7. Diabetes mellitus due to underlying condition, controlled, with diabetic neuropathy, without long-term  current use of insulin (HCC) Does not check sugars but is working on diet and weight loss,  - Dulaglutide (TRULICITY) 1.5 MG/8.6PY SOPN; Inject contents of one syringe once a  week  Dispense: 4 pen; Refill: 4  8. Atelectasis of left lung Lung sounds clear, follow-up CT chest shows resolution. Patient fluticasone was prescribed as daily medication discussed with patient and prevention of thrush

## 2018-08-02 ENCOUNTER — Encounter: Payer: Self-pay | Admitting: Nurse Practitioner

## 2018-08-02 ENCOUNTER — Other Ambulatory Visit: Payer: Self-pay | Admitting: *Deleted

## 2018-08-02 ENCOUNTER — Ambulatory Visit: Payer: 59 | Admitting: Nurse Practitioner

## 2018-08-02 ENCOUNTER — Other Ambulatory Visit: Payer: Self-pay

## 2018-08-02 ENCOUNTER — Ambulatory Visit
Admission: RE | Admit: 2018-08-02 | Discharge: 2018-08-02 | Disposition: A | Discharge status: Home / Self Care | Payer: 59 | Source: Ambulatory Visit | Attending: General Surgery | Admitting: General Surgery

## 2018-08-02 ENCOUNTER — Other Ambulatory Visit: Payer: Self-pay | Admitting: General Surgery

## 2018-08-02 VITALS — BP 130/82 | HR 75 | Temp 98.1°F | Resp 12 | Ht 74.0 in | Wt 264.0 lb

## 2018-08-02 DIAGNOSIS — R079 Chest pain, unspecified: Secondary | ICD-10-CM

## 2018-08-02 DIAGNOSIS — J9811 Atelectasis: Secondary | ICD-10-CM

## 2018-08-02 DIAGNOSIS — I10 Essential (primary) hypertension: Secondary | ICD-10-CM | POA: Diagnosis not present

## 2018-08-02 DIAGNOSIS — E782 Mixed hyperlipidemia: Secondary | ICD-10-CM

## 2018-08-02 DIAGNOSIS — E0840 Diabetes mellitus due to underlying condition with diabetic neuropathy, unspecified: Secondary | ICD-10-CM

## 2018-08-02 DIAGNOSIS — K760 Fatty (change of) liver, not elsewhere classified: Secondary | ICD-10-CM

## 2018-08-02 DIAGNOSIS — D497 Neoplasm of unspecified behavior of endocrine glands and other parts of nervous system: Secondary | ICD-10-CM | POA: Diagnosis not present

## 2018-08-02 DIAGNOSIS — I700 Atherosclerosis of aorta: Secondary | ICD-10-CM

## 2018-08-02 MED ORDER — DULAGLUTIDE 1.5 MG/0.5ML ~~LOC~~ SOAJ: SUBCUTANEOUS | 4 refills | Status: DC

## 2018-08-02 NOTE — Progress Notes (Signed)
Per Glenard Haring with CT, she needs a new order put in on this patient who has already had CT completed. Order needs to be CT abdomen without contrast.   Request completed.

## 2018-08-02 NOTE — Patient Instructions (Addendum)
-   Flonase you can use 2 sprays in each nostril as max, during allergy season may want to take loratadine (Claritin) or cetrizine (Zyrtec). Avoid ones with -D because they contain an oral decongestant that can spike your blood pressure.  Avoid: phenylephrine, phenylpropanolamine, and pseudoephredine   Start the new inhaler twice a day every day (Flovent), make sure you wash out your mouth thoroughly after this.  Use the rescue inhaler (Proair) just if needed for tightness or wheezing or coughing  Bad cholesterol, also called low-density lipoprotein (LDL), carries cholesterol and other fats that your liver makes to your body tissue. If it builds up in blood vessels, LDL can cause heart disease and other health problems. Your LDL level should be below 100. If you have diabetes or a possible heart problem, your LDL should be below 70.  Eat: Eat 20 to 30 grams of soluble fiber every day. Foods such as fruits and vegetables, whole grains, beans, peas, nuts, and seeds can help lower LDL. Avoid: Saturated fats (Dairy foods - such as butter, cream, ghee, regular-fat milk and cheese. Meat - such as fatty cuts of beef, pork and lamb, processed meats like salami, sausages and the skin on chicken. Lard., fatty snack foods, cakes, biscuits, pies and deep fried foods) Avoid smoking   Caution: prolonged use of proton pump inhibitors like omeprazole (Prilosec), pantoprazole (Protonix), esomeprazole (Nexium), and others like Dexilant and Aciphex may increase your risk of pneumonia, Clostridium difficile colitis, osteoporosis, anemia and other health complications Try to limit or avoid triggers like coffee, caffeinated beverages, onions, chocolate, spicy foods, peppermint, acid foods like pizza, spaghetti sauce, and orange juice Lose weight if you are overweight or obese Try elevating the head of your bed by placing a small wedge between your mattress and box springs to keep acid in the stomach at night instead of coming  up into your esophagus

## 2018-08-03 ENCOUNTER — Other Ambulatory Visit: Payer: Self-pay | Admitting: Family Medicine

## 2018-08-03 ENCOUNTER — Telehealth: Payer: Self-pay | Admitting: General Surgery

## 2018-08-03 NOTE — Telephone Encounter (Signed)
Gave patient results of CT scan.  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  No further studies or follow up needed.

## 2018-08-17 ENCOUNTER — Other Ambulatory Visit: Payer: Self-pay | Admitting: Family Medicine

## 2018-08-17 MED ORDER — AMLODIPINE BESYLATE 10 MG PO TABS: 10.0000 mg | ORAL_TABLET | Freq: Every day | ORAL | 0 refills | Status: DC

## 2018-09-07 ENCOUNTER — Encounter: Payer: Self-pay | Admitting: Nurse Practitioner

## 2018-09-07 ENCOUNTER — Encounter: Payer: Self-pay | Admitting: Family Medicine

## 2018-09-07 ENCOUNTER — Other Ambulatory Visit: Payer: Self-pay | Admitting: Family Medicine

## 2018-09-07 NOTE — Telephone Encounter (Signed)
Lab Results  Component Value Date   ALT 8 (L) 06/26/2018   Lab Results  Component Value Date   CHOL 144 06/26/2018   HDL 42 06/26/2018   LDLCALC 83 06/26/2018   TRIG 93 06/26/2018   CHOLHDL 3.4 06/26/2018   Please see previous note: Notes recorded by Arnetha Courser, MD on 06/27/2018 at 9:10 AM EST Roselyn Reef, please let the patient know that his cholesterol is not to goal, even with 80 mg of pravastatin; stop that; start new rosuvastatin; recheck lipids in 6 weeks (please ORDER); encourage him to really watch diet, work on weight loss    HOWEVER, during this outbreak of COVID-19, we understand if he/she wants to wait and have labs done in a few more months when the pandemic slows down I've sent 90 days to keep him/her out of the pharmacy Patient can come in when he/she is comfortable

## 2018-09-07 NOTE — Telephone Encounter (Signed)
Left voicemial 

## 2018-10-03 ENCOUNTER — Other Ambulatory Visit: Payer: Self-pay | Admitting: Family Medicine

## 2018-10-03 MED ORDER — TRAZODONE HCL 100 MG PO TABS: 100.0000 mg | ORAL_TABLET | Freq: Every evening | ORAL | 0 refills | Status: DC | PRN

## 2018-10-03 NOTE — Telephone Encounter (Signed)
Copied from [place] [phone]. Topic: Quick Communication - Rx Refill/Question >> Oct 03, 2018 12:35 PM Mathis Bud wrote: Medication: traZODone (DESYREL) 100 MG tablet   Has the patient contacted their pharmacy? Yes, no more refills   Preferred Pharmacy (with phone number or street name): [id number] [name], [place] [phone] (Phone) [phone] (Fax)    Agent: Please be advised that RX refills may take up to 3 business days. We ask that you follow-up with your pharmacy.

## 2018-10-11 ENCOUNTER — Other Ambulatory Visit: Payer: Self-pay | Admitting: Family Medicine

## 2018-11-10 ENCOUNTER — Other Ambulatory Visit: Payer: Self-pay | Admitting: Family Medicine

## 2018-11-29 ENCOUNTER — Ambulatory Visit: Payer: 59 | Admitting: Nurse Practitioner

## 2018-11-29 ENCOUNTER — Encounter: Payer: Self-pay | Admitting: Nurse Practitioner

## 2018-11-29 ENCOUNTER — Other Ambulatory Visit: Payer: Self-pay

## 2018-11-29 VITALS — BP 116/64 | HR 79 | Temp 97.1°F | Resp 14 | Ht 73.5 in | Wt 268.1 lb

## 2018-11-29 DIAGNOSIS — G4739 Other sleep apnea: Secondary | ICD-10-CM

## 2018-11-29 DIAGNOSIS — I10 Essential (primary) hypertension: Secondary | ICD-10-CM

## 2018-11-29 DIAGNOSIS — IMO0002 Reserved for concepts with insufficient information to code with codable children: Secondary | ICD-10-CM

## 2018-11-29 DIAGNOSIS — I700 Atherosclerosis of aorta: Secondary | ICD-10-CM

## 2018-11-29 DIAGNOSIS — G4700 Insomnia, unspecified: Secondary | ICD-10-CM

## 2018-11-29 DIAGNOSIS — E782 Mixed hyperlipidemia: Secondary | ICD-10-CM

## 2018-11-29 DIAGNOSIS — R350 Frequency of micturition: Secondary | ICD-10-CM

## 2018-11-29 DIAGNOSIS — E1140 Type 2 diabetes mellitus with diabetic neuropathy, unspecified: Secondary | ICD-10-CM

## 2018-11-29 DIAGNOSIS — M5134 Other intervertebral disc degeneration, thoracic region: Secondary | ICD-10-CM

## 2018-11-29 DIAGNOSIS — K760 Fatty (change of) liver, not elsewhere classified: Secondary | ICD-10-CM | POA: Diagnosis not present

## 2018-11-29 DIAGNOSIS — N401 Enlarged prostate with lower urinary tract symptoms: Secondary | ICD-10-CM

## 2018-11-29 DIAGNOSIS — D497 Neoplasm of unspecified behavior of endocrine glands and other parts of nervous system: Secondary | ICD-10-CM

## 2018-11-29 DIAGNOSIS — E1165 Type 2 diabetes mellitus with hyperglycemia: Secondary | ICD-10-CM

## 2018-11-29 MED ORDER — LOSARTAN POTASSIUM 100 MG PO TABS: 100.0000 mg | ORAL_TABLET | Freq: Every day | ORAL | 1 refills | Status: DC

## 2018-11-29 MED ORDER — ROSUVASTATIN CALCIUM 20 MG PO TABS: 20.0000 mg | ORAL_TABLET | Freq: Every day | ORAL | 1 refills | Status: DC

## 2018-11-29 MED ORDER — AMLODIPINE BESYLATE 10 MG PO TABS: 10.0000 mg | ORAL_TABLET | Freq: Every day | ORAL | 1 refills | Status: DC

## 2018-11-29 MED ORDER — TRAZODONE HCL 100 MG PO TABS: 100.0000 mg | ORAL_TABLET | Freq: Every evening | ORAL | 1 refills | Status: DC | PRN

## 2018-11-29 MED ORDER — METFORMIN HCL 1000 MG PO TABS: 1000.0000 mg | ORAL_TABLET | Freq: Two times a day (BID) | ORAL | 1 refills | Status: DC

## 2018-11-29 MED ORDER — TRULICITY 1.5 MG/0.5ML ~~LOC~~ SOAJ: SUBCUTANEOUS | 1 refills | Status: DC

## 2018-11-29 NOTE — Progress Notes (Signed)
Name: Marvin SLEMMER Sr.   MRN: [id number]    DOB: 1955/08/09   Date:11/29/2018       Progress Note  Subjective  Chief Complaint  Chief Complaint  Patient presents with  . Follow-up    HPI  Hypertension Patient is on amlodipine 86m daily, losartan 104mdaily, .  Takes medications as prescribed with no missed doses a month.  He is not compliant with low-salt diet.  He rarely checks blood pressures at home. Denies chest pain, headaches, blurry vision. Has erectile dysfunction- no improvement with cialis.  BP Readings from Last 3 Encounters:  11/29/18 116/64  08/02/18 130/82  07/31/18 (!) 144/81    Hyperlipidemia Patient rx rosuvastatin 2050maily and fish oils.  Takes medications as prescribed with no missed doses a month.  Diet: mostly baked foods and veggies, occasional- fried foods.  Denies myalgias Lab Results  Component Value Date   CHOL 144 06/26/2018   HDL 42 06/26/2018   LDLCALC 83 06/26/2018   TRIG 93 06/26/2018   CHOLHDL 3.4 06/26/2018    Diabetes Mellitus Patient is rx metformin 1000m45mD, glipizide 10mg20URly, trulicty 1.5,4.2,Hkly and jardiance 10mg68mly. Takes medications as prescribed with no missed doses a month.  Denies polyphagia, polydipsia, polyuria.  He stopped drinking sugary drinks.  He is on ARB and statin. Checks feet routinely, does have mild neuropathy- not much pain.  Needs eye doctor appointment- rescheduled due to pandemic.  Lab Results  Component Value Date   HGBA1C 6.6 (H) 06/26/2018   Insomnia Takes trazodone 100mg 75mtly, unable to sleep without it. He can get to sleep but wakes up frequently without it and only occasionally with it. Gets a max of 6 hours with it. Wears CPAP 100% self-reported compliance.   Adrenal tumor Followed by Dr. CannonCeline Ahral imaging recommended.   PHQ2/9: Depression screen PHQ 2/[facility]/15/2020 08/02/2018 06/26/2018 06/06/2018 04/17/2018  Decreased Interest 0 0 0 0 0  Down, Depressed, Hopeless 0 0 0 0 0   PHQ - 2 Score 0 0 0 0 0  Altered sleeping 0 0 0 0 0  Tired, decreased energy 0 0 0 0 0  Change in appetite 0 0 0 0 0  Feeling bad or failure about yourself  0 0 0 0 0  Trouble concentrating 0 0 0 0 0  Moving slowly or fidgety/restless 0 0 0 0 0  Suicidal thoughts 0 0 0 0 0  PHQ-9 Score 0 0 0 0 0  Difficult doing work/chores Not difficult at all Not difficult at all Not difficult at all Not difficult at all Not difficult at all  Some recent data might be hidden     PHQ reviewed. Negative  Patient Active Problem List   Diagnosis Date Noted  . Thoracic degenerative disc disease 11/29/2018  . Insomnia 11/29/2018  . Fatty liver disease, nonalcoholic 07/31/04/23/7628herosclerosis of abdominal aorta (HCC) 0[place]7/2020  . Adrenal tumor 07/26/2018  . Benign prostatic hyperplasia with urinary frequency 07/21/2018  . Atelectasis of left lung 06/26/2018  . Obesity (BMI 30.0-34.9) 04/17/2018  . Ventral hernia without obstruction or gangrene 05/13/2016  . GERD (gastroesophageal reflux disease) 01/04/2016  . DM type 2, uncontrolled, with neuropathy (HCC) 1[place]0/2016  . Erectile dysfunction due to arterial insufficiency 10/24/2014  . Hyperlipidemia 05/23/2014  . Sleep apnea 05/23/2014  . Benign essential HTN 02/14/2007    Past Medical History:  Diagnosis Date  . Adrenal tumor 07/26/2018  . Decreased libido   . History of  nuclear stress test    a. 05/2014: no evidence of significant ischemia, GI uptake was noted, EF 51%, no ECG changes concerning for ischemia, normal study  . HTN (hypertension)   . Hyperlipidemia   . Hypertension   . IDDM (insulin dependent diabetes mellitus) ([place])   . Obesity   . Pharyngitis   . Sleep apnea    uses CPAP    Past Surgical History:  Procedure Laterality Date  . AV FISTULA REPAIR    . CARDIAC CATHETERIZATION     [facility]   . COLONOSCOPY  2013    Social History   Tobacco Use  . Smoking status: Former Smoker    Packs/day: 0.25    Years: 25.00     Pack years: 6.25    Types: Cigarettes    Quit date: 05/17/1998    Years since quitting: 20.5  . Smokeless tobacco: Never Used  . Tobacco comment: off and on - maybe 5 cigarettes  Substance Use Topics  . Alcohol use: No    Alcohol/week: 0.0 standard drinks    Comment: previously but not currently     Current Outpatient Medications:  .  albuterol (PROAIR HFA) 108 (90 Base) MCG/ACT inhaler, Inhale 2 puffs into the lungs every 4 (four) hours as needed for wheezing or shortness of breath., Disp: 1 Inhaler, Rfl: 1 .  amLODipine (NORVASC) 10 MG tablet, TAKE 1 TABLET(10 MG) BY MOUTH DAILY, Disp: 90 tablet, Rfl: 0 .  aspirin 81 MG tablet, Take 81 mg by mouth daily., Disp: , Rfl:  .  Cholecalciferol (VITAMIN D) 2000 UNITS tablet, Take 2,000 Units by mouth daily., Disp: , Rfl:  .  Dulaglutide (TRULICITY) 1.5 QP/5.9FM SOPN, Inject contents of one syringe once a week, Disp: 4 pen, Rfl: 4 .  empagliflozin (JARDIANCE) 10 MG TABS tablet, Take 10 mg by mouth daily., Disp: 90 tablet, Rfl: 1 .  Flaxseed, Linseed, (FLAXSEED OIL) 1000 MG CAPS, Take 1,000 mg by mouth daily. , Disp: , Rfl:  .  fluticasone (FLONASE) 50 MCG/ACT nasal spray, USE 2 SPRAYS IN EACH  NOSTRIL DAILY (IF NEEDED  FOR ALLERGY SYMPTOMS), Disp: 48 g, Rfl: 0 .  glipiZIDE (GLUCOTROL) 10 MG tablet, TAKE 1 TABLET BY MOUTH TWICE DAILY BEFORE A MEAL FOR HIGH SUGARS, Disp: 180 tablet, Rfl: 1 .  losartan (COZAAR) 100 MG tablet, Take 1 tablet (100 mg total) by mouth daily., Disp: 90 tablet, Rfl: 1 .  metFORMIN (GLUCOPHAGE) 1000 MG tablet, Take 1 tablet (1,000 mg total) by mouth 2 (two) times daily with a meal., Disp: 180 tablet, Rfl: 1 .  Omega-3 Fatty Acids (FISH OIL) 1000 MG CAPS, Take 1,000 mg by mouth daily., Disp: , Rfl:  .  rosuvastatin (CRESTOR) 20 MG tablet, TAKE 1 TABLET BY MOUTH EVERY DAY FOR CHOLESTEROL THIS REPLACES PRAVASTATIN, Disp: 90 tablet, Rfl: 0 .  tadalafil (ADCIRCA/CIALIS) 20 MG tablet, Take 1 tablet (20 mg total) by mouth daily as  needed for erectile dysfunction., Disp: 30 tablet, Rfl: 0 .  traZODone (DESYREL) 100 MG tablet, Take 1 tablet (100 mg total) by mouth at bedtime as needed. for sleep, Disp: 90 tablet, Rfl: 0 .  vitamin B-12 (CYANOCOBALAMIN) 1000 MCG tablet, Take 1,000 mcg by mouth daily., Disp: , Rfl:  .  Insulin Pen Needle 32G X 6 MM MISC, 1 Package by Does not apply route once., Disp: 100 each, Rfl: 3  Allergies  Allergen Reactions  . Atorvastatin Other (See Comments)    Statins cause headaches and muscle aches  Review of Systems  Constitutional: Negative for chills, fever and malaise/fatigue.  Eyes: Negative for blurred vision and double vision.  Respiratory: Negative for cough and shortness of breath.   Cardiovascular: Negative for chest pain, palpitations and leg swelling.  Gastrointestinal: Negative for abdominal pain, blood in stool and constipation.  Musculoskeletal: Negative for joint pain and myalgias.  Skin: Negative for rash.  Neurological: Negative for dizziness and headaches.  Psychiatric/Behavioral: The patient is not nervous/anxious and does not have insomnia.      No other specific complaints in a complete review of systems (except as listed in HPI above).  Objective  Vitals:   11/29/18 1529  BP: 116/64  Pulse: 79  Resp: 14  Temp: (!) 97.1 F (36.2 C)  TempSrc: Temporal  SpO2: 98%  Weight: 268 lb 1.6 oz (121.6 kg)  Height: 6' 1.5" (1.867 m)    Body mass index is 34.89 kg/m.  Nursing Note and Vital Signs reviewed.  Physical Exam Vitals signs reviewed.  Constitutional:      Appearance: He is well-developed.  HENT:     Head: Normocephalic and atraumatic.  Neck:     Musculoskeletal: Normal range of motion and neck supple.     Vascular: No carotid bruit.  Cardiovascular:     Heart sounds: Normal heart sounds.  Pulmonary:     Effort: Pulmonary effort is normal.     Breath sounds: Normal breath sounds.  Abdominal:     General: Bowel sounds are normal.      Palpations: Abdomen is soft.     Tenderness: There is no abdominal tenderness.  Musculoskeletal: Normal range of motion.  Skin:    General: Skin is warm and dry.     Capillary Refill: Capillary refill takes less than 2 seconds.  Neurological:     Mental Status: He is alert and oriented to person, place, and time.     GCS: GCS eye subscore is 4. GCS verbal subscore is 5. GCS motor subscore is 6.     Sensory: No sensory deficit.  Psychiatric:        Speech: Speech normal.        Behavior: Behavior normal.        Thought Content: Thought content normal.        Judgment: Judgment normal.       No results found for this or any previous visit (from the past 48 hour(s)).  Assessment & Plan  1. Benign essential HTN stable - CMP14+EGFR - amLODipine (NORVASC) 10 MG tablet; Take 1 tablet (10 mg total) by mouth daily.  Dispense: 90 tablet; Refill: 1 - losartan (COZAAR) 100 MG tablet; Take 1 tablet (100 mg total) by mouth daily.  Dispense: 90 tablet; Refill: 1  2. Atherosclerosis of abdominal aorta (HCC) - Lipid Profile - amLODipine (NORVASC) 10 MG tablet; Take 1 tablet (10 mg total) by mouth daily.  Dispense: 90 tablet; Refill: 1 - rosuvastatin (CRESTOR) 20 MG tablet; Take 1 tablet (20 mg total) by mouth daily.  Dispense: 90 tablet; Refill: 1 - losartan (COZAAR) 100 MG tablet; Take 1 tablet (100 mg total) by mouth daily.  Dispense: 90 tablet; Refill: 1  3. Other sleep apnea Continue wearing cpap  4. Fatty liver disease, nonalcoholic Encouraged weight loss - CMP14+EGFR - Dulaglutide (TRULICITY) 1.5 BT/6.6MA SOPN; Inject contents of one syringe once a week  Dispense: 12 pen; Refill: 1  5. DM type 2, uncontrolled, with neuropathy ([place]) Consider coming off of glipizide and increasing jardiance, discussed diet -  HgB A1c - Dulaglutide (TRULICITY) 1.5 WI/2.0BT SOPN; Inject contents of one syringe once a week  Dispense: 12 pen; Refill: 1 - rosuvastatin (CRESTOR) 20 MG tablet; Take 1  tablet (20 mg total) by mouth daily.  Dispense: 90 tablet; Refill: 1 - metFORMIN (GLUCOPHAGE) 1000 MG tablet; Take 1 tablet (1,000 mg total) by mouth 2 (two) times daily with a meal.  Dispense: 180 tablet; Refill: 1 - losartan (COZAAR) 100 MG tablet; Take 1 tablet (100 mg total) by mouth daily.  Dispense: 90 tablet; Refill: 1  6. Mixed hyperlipidemia - Lipid Profile - rosuvastatin (CRESTOR) 20 MG tablet; Take 1 tablet (20 mg total) by mouth daily.  Dispense: 90 tablet; Refill: 1  7. Benign prostatic hyperplasia with urinary frequency Stable, not on medications  8. Adrenal tumor Stable, routine annual imaging- followed by Dr. Celine Ahr   9. Thoracic degenerative disc disease Discussed weight loss  10. Insomnia, unspecified type - traZODone (DESYREL) 100 MG tablet; Take 1 tablet (100 mg total) by mouth at bedtime as needed. for sleep  Dispense: 90 tablet; Refill: 1

## 2018-11-29 NOTE — Patient Instructions (Addendum)
-   Please call to set up eye doctor appointment  Bad cholesterol, also called low-density lipoprotein (LDL), carries cholesterol and other fats that your liver makes to your body tissue. If it builds up in blood vessels, LDL can cause heart disease and other health problems. Your LDL level should be below 100. If you have diabetes or a possible heart problem, your LDL should be below 70.  Eat: Eat 20 to 30 grams of soluble fiber every day. Foods such as fruits and vegetables, whole grains, beans, peas, nuts, and seeds can help lower LDL. Avoid: Saturated fats (Dairy foods - such as butter, cream, ghee, regular-fat milk and cheese. Meat - such as fatty cuts of beef, pork and lamb, processed meats like salami, sausages and the skin on chicken. Lard., fatty snack foods, cakes, biscuits, pies and deep fried foods) Avoid smoking  Good cholesterol, also called high-density lipoprotein (HDL) removes extra cholesterol and plaque buildup in your arteries and then sends it to your liver to get rid of and helps reduce your risk of heart disease, heart attack, and stroke.Foods that increase HDL: beans and legumes, whole grains, high-fiber fruits:prunes, apples, and pears; fatty fish- salmon, tuna, sardines; nuts, olive oil

## 2018-11-30 ENCOUNTER — Ambulatory Visit: Payer: 59 | Admitting: Family Medicine

## 2018-12-01 LAB — CMP14+EGFR
ALT: 10 IU/L (ref 0–44)
AST: 16 IU/L (ref 0–40)
Albumin/Globulin Ratio: 1.5 (ref 1.2–2.2)
Albumin: 4.2 g/dL (ref 3.8–4.8)
Alkaline Phosphatase: 71 IU/L (ref 39–117)
BUN/Creatinine Ratio: 13 (ref 10–24)
BUN: 15 mg/dL (ref 8–27)
Bilirubin Total: 0.4 mg/dL (ref 0.0–1.2)
CO2: 20 mmol/L (ref 20–29)
Calcium: 9.7 mg/dL (ref 8.6–10.2)
Chloride: 104 mmol/L (ref 96–106)
Creatinine, Ser: 1.2 mg/dL (ref 0.76–1.27)
GFR calc Af Amer: 75 mL/min/{1.73_m2} (ref >59)
GFR calc non Af Amer: 65 mL/min/{1.73_m2} (ref >59)
Globulin, Total: 2.8 g/dL (ref 1.5–4.5)
Glucose: 140 mg/dL — ABNORMAL HIGH (ref 65–99)
Potassium: 4.6 mmol/L (ref 3.5–5.2)
Sodium: 138 mmol/L (ref 134–144)
Total Protein: 7 g/dL (ref 6.0–8.5)

## 2018-12-01 LAB — LIPID PANEL
Chol/HDL Ratio: 2.8 ratio (ref 0.0–5.0)
Cholesterol, Total: 109 mg/dL (ref 100–199)
HDL: 39 mg/dL — ABNORMAL LOW (ref >39)
LDL Calculated: 56 mg/dL (ref 0–99)
Triglycerides: 71 mg/dL (ref 0–149)
VLDL Cholesterol Cal: 14 mg/dL (ref 5–40)

## 2018-12-01 LAB — HEMOGLOBIN A1C
Est. average glucose Bld gHb Est-mCnc: 171 mg/dL
Hgb A1c MFr Bld: 7.6 % — ABNORMAL HIGH (ref 4.8–5.6)

## 2018-12-04 ENCOUNTER — Other Ambulatory Visit: Payer: Self-pay | Admitting: Nurse Practitioner

## 2018-12-04 MED ORDER — JARDIANCE 25 MG PO TABS: 25.0000 mg | ORAL_TABLET | Freq: Every day | ORAL | 0 refills | Status: DC

## 2018-12-08 ENCOUNTER — Other Ambulatory Visit: Payer: Self-pay | Admitting: Family Medicine

## 2018-12-08 DIAGNOSIS — I700 Atherosclerosis of aorta: Secondary | ICD-10-CM

## 2018-12-08 DIAGNOSIS — IMO0002 Reserved for concepts with insufficient information to code with codable children: Secondary | ICD-10-CM

## 2018-12-08 DIAGNOSIS — E782 Mixed hyperlipidemia: Secondary | ICD-10-CM

## 2018-12-08 DIAGNOSIS — E1140 Type 2 diabetes mellitus with diabetic neuropathy, unspecified: Secondary | ICD-10-CM

## 2019-03-01 ENCOUNTER — Other Ambulatory Visit: Payer: Self-pay

## 2019-03-01 ENCOUNTER — Encounter: Payer: Self-pay | Admitting: Family Medicine

## 2019-03-01 ENCOUNTER — Ambulatory Visit (INDEPENDENT_AMBULATORY_CARE_PROVIDER_SITE_OTHER): Payer: 59 | Admitting: Family Medicine

## 2019-03-01 VITALS — BP 128/78 | HR 69 | Temp 97.6°F | Resp 16 | Ht 74.0 in | Wt 263.9 lb

## 2019-03-01 DIAGNOSIS — E1165 Type 2 diabetes mellitus with hyperglycemia: Secondary | ICD-10-CM | POA: Diagnosis not present

## 2019-03-01 DIAGNOSIS — IMO0002 Reserved for concepts with insufficient information to code with codable children: Secondary | ICD-10-CM

## 2019-03-01 DIAGNOSIS — Z1211 Encounter for screening for malignant neoplasm of colon: Secondary | ICD-10-CM | POA: Diagnosis not present

## 2019-03-01 DIAGNOSIS — Z0000 Encounter for general adult medical examination without abnormal findings: Secondary | ICD-10-CM

## 2019-03-01 DIAGNOSIS — E1140 Type 2 diabetes mellitus with diabetic neuropathy, unspecified: Secondary | ICD-10-CM

## 2019-03-01 DIAGNOSIS — M5441 Lumbago with sciatica, right side: Secondary | ICD-10-CM

## 2019-03-01 LAB — POCT GLYCOSYLATED HEMOGLOBIN (HGB A1C): HbA1c, POC (controlled diabetic range): 6.5 % (ref 0.0–7.0)

## 2019-03-01 MED ORDER — TIZANIDINE HCL 2 MG PO TABS: 2.0000 mg | ORAL_TABLET | Freq: Three times a day (TID) | ORAL | 0 refills | Status: DC | PRN

## 2019-03-01 NOTE — Progress Notes (Signed)
Name: Marvin AJELLO Sr.   MRN: [id number]    DOB: 12/14/1955   Date:03/01/2019       Progress Note  Subjective  Chief Complaint  Chief Complaint  Patient presents with  . Annual Exam    HPI  Patient presents for annual CPE  USPSTF grade A and B recommendations:  Diet: Eats mostly baked, whole wheat/Fister rice, plenty of vegetables Exercise: Walks a lot for his job; otherwise not exercising.  Depression: phq 9 is positive Depression screen [facility] 2/9 03/01/2019 11/29/2018 08/02/2018 06/26/2018 06/06/2018  Decreased Interest 0 0 0 0 0  Down, Depressed, Hopeless 0 0 0 0 0  PHQ - 2 Score 0 0 0 0 0  Altered sleeping 2 0 0 0 0  Tired, decreased energy 0 0 0 0 0  Change in appetite 0 0 0 0 0  Feeling bad or failure about yourself  0 0 0 0 0  Trouble concentrating 0 0 0 0 0  Moving slowly or fidgety/restless 0 0 0 0 0  Suicidal thoughts 0 0 0 0 0  PHQ-9 Score 2 0 0 0 0  Difficult doing work/chores Not difficult at all Not difficult at all Not difficult at all Not difficult at all Not difficult at all  Some recent data might be hidden   Hypertension:  BP Readings from Last 3 Encounters:  03/01/19 128/78  11/29/18 116/64  08/02/18 130/82    Obesity: Wt Readings from Last 3 Encounters:  03/01/19 263 lb 14.4 oz (119.7 kg)  11/29/18 268 lb 1.6 oz (121.6 kg)  08/02/18 264 lb (119.7 kg)   BMI Readings from Last 3 Encounters:  03/01/19 33.88 kg/m  11/29/18 34.89 kg/m  08/02/18 33.90 kg/m    Lipids:  Lab Results  Component Value Date   CHOL 109 11/30/2018   CHOL 144 06/26/2018   CHOL 143 12/23/2017   Lab Results  Component Value Date   HDL 39 (L) 11/30/2018   HDL 42 06/26/2018   HDL 40 12/23/2017   Lab Results  Component Value Date   LDLCALC 56 11/30/2018   LDLCALC 83 06/26/2018   LDLCALC 89 12/23/2017   Lab Results  Component Value Date   TRIG 71 11/30/2018   TRIG 93 06/26/2018   TRIG 71 12/23/2017   Lab Results  Component Value Date   CHOLHDL 2.8  11/30/2018   CHOLHDL 3.4 06/26/2018   CHOLHDL 3.6 12/23/2017   No results found for: LDLDIRECT Glucose: Due for A1C today Glucose  Date Value Ref Range Status  11/30/2018 140 (H) 65 - 99 mg/dL Final  12/23/2017 106 (H) 65 - 99 mg/dL Final  09/22/2017 101 (H) 65 - 99 mg/dL Final  04/22/2014 284 (H) 65 - 99 mg/dL Final  10/23/2012 287 (H) 65 - 99 mg/dL Final  11/30/2011 308 (H) 65 - 99 mg/dL Final   Glucose, Bld  Date Value Ref Range Status  06/26/2018 109 (H) 65 - 99 mg/dL Final    Comment:    .            Fasting reference interval . For someone without known diabetes, a glucose value between 100 and 125 mg/dL is consistent with prediabetes and should be confirmed with a follow-up test. .   12/26/2015 313 (H) 65 - 99 mg/dL Final  04/29/2015 224 (H) 65 - 99 mg/dL Final      [facility] Visit from 03/01/2019 in [facility]  AUDIT-C Score  0    No ETOH  use  Married STD testing and prevention (HIV/chl/gon/syphilis): HIV negative 2017; declines additional testing Hep C: Hep C negative in 2017  Skin cancer: No concerns Colorectal cancer: Due in January 2021 for repeat colonoscopy.  Denies family or personal history of colorectal cancer, no changes in BM's - no blood in stool, dark and tarry stool, mucus in stool, or constipation/diarrhea. Prostate cancer: No known family history;  No results found for: PSA  IPSS Questionnaire (AUA-7): Over the past month.   1)  How often have you had a sensation of not emptying your bladder completely after you finish urinating?  0 - Not at all  2)  How often have you had to urinate again less than two hours after you finished urinating? 0 - Not at all  3)  How often have you found you stopped and started again several times when you urinated?  0 - Not at all  4) How difficult have you found it to postpone urination?  0 - Not at all  5) How often have you had a weak urinary stream?  2 - Less than half the time  6) How  often have you had to push or strain to begin urination?  0 - Not at all  7) How many times did you most typically get up to urinate from the time you went to bed until the time you got up in the morning?  0 - None  Total score:  0-7 mildly symptomatic   8-19 moderately symptomatic   20-35 severely symptomatic  Score is very low - discussed PSA screening, will hold off for now.   Lung cancer:  Former Smoker - 25 pack years, quit 20 years ago  Low Dose CT Chest recommended if Age 46-80 years, 30 pack-year currently smoking OR have quit w/in 15years. Patient does not qualify.   AAA: The USPSTF recommends one-time screening with ultrasonography in men ages 41 to 63 years who have ever smoked ECG:  Denies chest pain, shortness of breath, palpitations  Advanced Care Planning: A voluntary discussion about advance care planning including the explanation and discussion of advance directives.  Discussed health care proxy and Living will, and the patient was able to identify a health care proxy as Marvin Duncan).  Patient does not have a living will at present time. If patient does have living will, I have requested they bring this to the clinic to be scanned in to their chart.  Patient Active Problem List   Diagnosis Date Noted  . Thoracic degenerative disc disease 11/29/2018  . Insomnia 11/29/2018  . Fatty liver disease, nonalcoholic 0000000  . Atherosclerosis of abdominal aorta ([place]) 08/01/2018  . Adrenal tumor 07/26/2018  . Benign prostatic hyperplasia with urinary frequency 07/21/2018  . Atelectasis of left lung 06/26/2018  . Obesity (BMI 30.0-34.9) 04/17/2018  . Ventral hernia without obstruction or gangrene 05/13/2016  . GERD (gastroesophageal reflux disease) 01/04/2016  . DM type 2, uncontrolled, with neuropathy ([place]) 03/06/2015  . Erectile dysfunction due to arterial insufficiency 10/24/2014  . Hyperlipidemia 05/23/2014  . Sleep apnea 05/23/2014  . Benign essential HTN 02/14/2007     Past Surgical History:  Procedure Laterality Date  . AV FISTULA REPAIR    . CARDIAC CATHETERIZATION     [facility]   . COLONOSCOPY  2013    Family History  Problem Relation Age of Onset  . Breast cancer Mother   . Cancer Mother   . Kidney failure Father   . Diabetes Father   .  Alcohol abuse Father   . Diabetes Sister   . Other Daughter     Social History   Socioeconomic History  . Marital status: Married    Spouse name: Holley Raring  . Number of children: 3  . Years of education: Not on file  . Highest education level: Some college, no degree  Occupational History  . Not on file  Social Needs  . Financial resource strain: Somewhat hard  . Food insecurity    Worry: Never true    Inability: Never true  . Transportation needs    Medical: No    Non-medical: No  Tobacco Use  . Smoking status: Former Smoker    Packs/day: 0.25    Years: 25.00    Pack years: 6.25    Types: Cigarettes    Quit date: 05/17/1998    Years since quitting: 20.8  . Smokeless tobacco: Never Used  . Tobacco comment: off and on - maybe 5 cigarettes  Substance and Sexual Activity  . Alcohol use: No    Alcohol/week: 0.0 standard drinks    Comment: previously but not currently  . Drug use: No  . Sexual activity: Yes    Partners: Female  Lifestyle  . Physical activity    Days per week: 0 days    Minutes per session: 0 min  . Stress: Only a little  Relationships  . Social Herbalist on phone: Twice a week    Gets together: Never    Attends religious service: 1 to 4 times per year    Active member of club or organization: Yes    Attends meetings of clubs or organizations: 1 to 4 times per year    Relationship status: Married  . Intimate partner violence    Fear of current or ex partner: No    Emotionally abused: No    Physically abused: No    Forced sexual activity: No  Other Topics Concern  . Not on file  Social History Narrative  . Not on file     Current Outpatient  Medications:  .  amLODipine (NORVASC) 10 MG tablet, Take 1 tablet (10 mg total) by mouth daily., Disp: 90 tablet, Rfl: 1 .  aspirin 81 MG tablet, Take 81 mg by mouth daily., Disp: , Rfl:  .  Cholecalciferol (VITAMIN D) 2000 UNITS tablet, Take 2,000 Units by mouth daily., Disp: , Rfl:  .  Dulaglutide (TRULICITY) 1.5 0000000 SOPN, Inject contents of one syringe once a week, Disp: 12 pen, Rfl: 1 .  empagliflozin (JARDIANCE) 25 MG TABS tablet, Take 25 mg by mouth daily., Disp: 90 tablet, Rfl: 0 .  Flaxseed, Linseed, (FLAXSEED OIL) 1000 MG CAPS, Take 1,000 mg by mouth daily. , Disp: , Rfl:  .  fluticasone (FLONASE) 50 MCG/ACT nasal spray, USE 2 SPRAYS IN EACH  NOSTRIL DAILY (IF NEEDED  FOR ALLERGY SYMPTOMS), Disp: 48 g, Rfl: 0 .  glipiZIDE (GLUCOTROL) 10 MG tablet, TAKE 1 TABLET BY MOUTH TWICE DAILY BEFORE A MEAL FOR HIGH SUGARS, Disp: 180 tablet, Rfl: 1 .  Insulin Pen Needle 32G X 6 MM MISC, 1 Package by Does not apply route once., Disp: 100 each, Rfl: 3 .  losartan (COZAAR) 100 MG tablet, Take 1 tablet (100 mg total) by mouth daily., Disp: 90 tablet, Rfl: 1 .  Omega-3 Fatty Acids (FISH OIL) 1000 MG CAPS, Take 1,000 mg by mouth daily., Disp: , Rfl:  .  rosuvastatin (CRESTOR) 20 MG tablet, Take 1 tablet (20 mg  total) by mouth daily., Disp: 90 tablet, Rfl: 1 .  traZODone (DESYREL) 100 MG tablet, Take 1 tablet (100 mg total) by mouth at bedtime as needed. for sleep, Disp: 90 tablet, Rfl: 1 .  vitamin B-12 (CYANOCOBALAMIN) 1000 MCG tablet, Take 1,000 mcg by mouth daily., Disp: , Rfl:  .  metFORMIN (GLUCOPHAGE) 1000 MG tablet, Take 1 tablet (1,000 mg total) by mouth 2 (two) times daily with a meal., Disp: 180 tablet, Rfl: 1  Allergies  Allergen Reactions  . Atorvastatin Other (See Comments)    Statins cause headaches and muscle aches     ROS  Constitutional: Negative for fever or weight change.  Respiratory: Negative for cough and shortness of breath.   Cardiovascular: Negative for chest pain or  palpitations.  Gastrointestinal: Negative for abdominal pain, no bowel changes.  Musculoskeletal: Negative for gait problem or joint swelling. He notes ongoing right sided low back pain with right sciatica for the last 4-5 months.  He walks and stands a lot in safety shoes for his job. Denies weakness, no known injury to the area. Skin: Negative for rash.  Neurological: Negative for dizziness or headache.  No other specific complaints in a complete review of systems (except as listed in HPI above).   Objective  Vitals:   03/01/19 0730  BP: 128/78  Pulse: 69  Resp: 16  Temp: 97.6 F (36.4 C)  TempSrc: Temporal  SpO2: 99%  Weight: 263 lb 14.4 oz (119.7 kg)  Height: 6\' 2"  (1.88 m)    Body mass index is 33.88 kg/m.  Physical Exam  Constitutional: Patient appears well-developed and well-nourished. No distress.  HENT: Head: Normocephalic and atraumatic. Ears: B TMs ok, no erythema or effusion; Nose: Nose normal. Mouth/Throat: Oropharynx is clear and moist. No oropharyngeal exudate.  Eyes: Conjunctivae and EOM are normal. Pupils are equal, round, and reactive to light. No scleral icterus.  Neck: Normal range of motion. Neck supple. No JVD present. No thyromegaly present.  Cardiovascular: Normal rate, regular rhythm and normal heart sounds.  No murmur heard. No BLE edema. Pulmonary/Chest: Effort normal and breath sounds normal. No respiratory distress. Abdominal: Soft. Bowel sounds are normal, no distension. There is no tenderness. no masses RECTAL: Deferred Musculoskeletal: Normal range of motion, no joint effusions. No gross deformities Neurological: he is alert and oriented to person, place, and time. No cranial nerve deficit. Coordination, balance, strength, speech and gait are normal.  Skin: Skin is warm and dry. No rash noted. No erythema.  Psychiatric: Patient has a normal mood and affect. behavior is normal. Judgment and thought content normal.  No results found for this or  any previous visit (from the past 2160 hour(s)).   PHQ2/9: Depression screen [facility] 2/9 03/01/2019 11/29/2018 08/02/2018 06/26/2018 06/06/2018  Decreased Interest 0 0 0 0 0  Down, Depressed, Hopeless 0 0 0 0 0  PHQ - 2 Score 0 0 0 0 0  Altered sleeping 2 0 0 0 0  Tired, decreased energy 0 0 0 0 0  Change in appetite 0 0 0 0 0  Feeling bad or failure about yourself  0 0 0 0 0  Trouble concentrating 0 0 0 0 0  Moving slowly or fidgety/restless 0 0 0 0 0  Suicidal thoughts 0 0 0 0 0  PHQ-9 Score 2 0 0 0 0  Difficult doing work/chores Not difficult at all Not difficult at all Not difficult at all Not difficult at all Not difficult at all  Some recent data  might be hidden   Fall Risk: Fall Risk  03/01/2019 11/29/2018 08/02/2018 07/31/2018 06/26/2018  Falls in the past year? 0 0 0 0 0  Number falls in past yr: 0 0 0 0 -  Injury with Fall? 0 0 0 0 -  Follow up Falls evaluation completed - - - -   Assessment & Plan  1. Annual physical exam -Prostate cancer screening and PSA options (with potential risks and benefits of testing vs not testing) were discussed along with recent recs/guidelines. -USPSTF grade A and B recommendations reviewed with patient; age-appropriate recommendations, preventive care, screening tests, etc discussed and encouraged; healthy living encouraged; see AVS for patient education given to patient -Discussed importance of 150 minutes of physical activity weekly, eat two servings of fish weekly, eat one serving of tree nuts ( cashews, pistachios, pecans, almonds.Marland Kitchen) every other day, eat 6 servings of fruit/vegetables daily and drink plenty of water and avoid sweet beverages.   2. Acute right-sided low back pain with right-sided sciatica - Will try muscle relaxer, but if ineffective, may call back and we can consider QHS gabapentin dosing as he has taken this in the past. - tiZANidine (ZANAFLEX) 2 MG tablet; Take 1 tablet (2 mg total) by mouth every 8 (eight) hours as needed for  muscle spasms (back pain).  Dispense: 30 tablet; Refill: 0  3. DM type 2, uncontrolled, with neuropathy (HCC) - POCT glycosylated hemoglobin (Hb A1C)  4. Colon cancer screening - Ambulatory referral to Gastroenterology

## 2019-03-01 NOTE — Patient Instructions (Addendum)
Back Exercises These exercises help to make your trunk and back strong. They also help to keep the lower back flexible. Doing these exercises can help to prevent back pain or lessen existing pain.  If you have back pain, try to do these exercises 2-3 times each day or as told by your doctor.  As you get better, do the exercises once each day. Repeat the exercises more often as told by your doctor.  To stop back pain from coming back, do the exercises once each day, or as told by your doctor. Exercises Single knee to chest Do these steps 3-5 times in a row for each leg: 1. Lie on your back on a firm bed or the floor with your legs stretched out. 2. Bring one knee to your chest. 3. Grab your knee or thigh with both hands and hold them it in place. 4. Pull on your knee until you feel a gentle stretch in your lower back or buttocks. 5. Keep doing the stretch for 10-30 seconds. 6. Slowly let go of your leg and straighten it. Pelvic tilt Do these steps 5-10 times in a row: 1. Lie on your back on a firm bed or the floor with your legs stretched out. 2. Bend your knees so they point up to the ceiling. Your feet should be flat on the floor. 3. Tighten your lower belly (abdomen) muscles to press your lower back against the floor. This will make your tailbone point up to the ceiling instead of pointing down to your feet or the floor. 4. Stay in this position for 5-10 seconds while you gently tighten your muscles and breathe evenly. Cat-cow Do these steps until your lower back bends more easily: 1. Get on your hands and knees on a firm surface. Keep your hands under your shoulders, and keep your knees under your hips. You may put padding under your knees. 2. Let your head hang down toward your chest. Tighten (contract) the muscles in your belly. Point your tailbone toward the floor so your lower back becomes rounded like the back of a cat. 3. Stay in this position for 5 seconds. 4. Slowly lift your  head. Let the muscles of your belly relax. Point your tailbone up toward the ceiling so your back forms a sagging arch like the back of a cow. 5. Stay in this position for 5 seconds.  Press-ups Do these steps 5-10 times in a row: 1. Lie on your belly (face-down) on the floor. 2. Place your hands near your head, about shoulder-width apart. 3. While you keep your back relaxed and keep your hips on the floor, slowly straighten your arms to raise the top half of your body and lift your shoulders. Do not use your back muscles. You may change where you place your hands in order to make yourself more comfortable. 4. Stay in this position for 5 seconds. 5. Slowly return to lying flat on the floor.  Bridges Do these steps 10 times in a row: 1. Lie on your back on a firm surface. 2. Bend your knees so they point up to the ceiling. Your feet should be flat on the floor. Your arms should be flat at your sides, next to your body. 3. Tighten your butt muscles and lift your butt off the floor until your waist is almost as high as your knees. If you do not feel the muscles working in your butt and the back of your thighs, slide your feet 1-2 inches  farther away from your butt. 4. Stay in this position for 3-5 seconds. 5. Slowly lower your butt to the floor, and let your butt muscles relax. If this exercise is too easy, try doing it with your arms crossed over your chest. Belly crunches Do these steps 5-10 times in a row: 1. Lie on your back on a firm bed or the floor with your legs stretched out. 2. Bend your knees so they point up to the ceiling. Your feet should be flat on the floor. 3. Cross your arms over your chest. 4. Tip your chin a little bit toward your chest but do not bend your neck. 5. Tighten your belly muscles and slowly raise your chest just enough to lift your shoulder blades a tiny bit off of the floor. Avoid raising your body higher than that, because it can put too much stress on your low  back. 6. Slowly lower your chest and your head to the floor. Back lifts Do these steps 5-10 times in a row: 1. Lie on your belly (face-down) with your arms at your sides, and rest your forehead on the floor. 2. Tighten the muscles in your legs and your butt. 3. Slowly lift your chest off of the floor while you keep your hips on the floor. Keep the back of your head in line with the curve in your back. Look at the floor while you do this. 4. Stay in this position for 3-5 seconds. 5. Slowly lower your chest and your face to the floor. Contact a doctor if:  Your back pain gets a lot worse when you do an exercise.  Your back pain does not get better 2 hours after you exercise. If you have any of these problems, stop doing the exercises. Do not do them again unless your doctor says it is okay. Get help right away if:  You have sudden, very bad back pain. If this happens, stop doing the exercises. Do not do them again unless your doctor says it is okay. This information is not intended to replace advice given to you by your health care provider. Make sure you discuss any questions you have with your health care provider. Document Released: 06/05/2010 Document Revised: 01/26/2018 Document Reviewed: 01/26/2018 Elsevier Patient Education  2020 [employer]   Preventive Care 21-29 Years Old, Male Preventive care refers to lifestyle choices and visits with your health care provider that can promote health and wellness. This includes:  A yearly physical exam. This is also called an annual well check.  Regular dental and eye exams.  Immunizations.  Screening for certain conditions.  Healthy lifestyle choices, such as eating a healthy diet, getting regular exercise, not using drugs or products that contain nicotine and tobacco, and limiting alcohol use. What can I expect for my preventive care visit? Physical exam Your health care provider will check:  Height and weight. These may be used  to calculate body mass index (BMI), which is a measurement that tells if you are at a healthy weight.  Heart rate and blood pressure.  Your skin for abnormal spots. Counseling Your health care provider may ask you questions about:  Alcohol, tobacco, and drug use.  Emotional well-being.  Home and relationship well-being.  Sexual activity.  Eating habits.  Work and work Statistician. What immunizations do I need?  Influenza (flu) vaccine  This is recommended every year. Tetanus, diphtheria, and pertussis (Tdap) vaccine  You may need a Td booster every 10 years. Varicella (chickenpox)  vaccine  You may need this vaccine if you have not already been vaccinated. Zoster (shingles) vaccine  You may need this after age 85. Measles, mumps, and rubella (MMR) vaccine  You may need at least one dose of MMR if you were born in 1957 or later. You may also need a second dose. Pneumococcal conjugate (PCV13) vaccine  You may need this if you have certain conditions and were not previously vaccinated. Pneumococcal polysaccharide (PPSV23) vaccine  You may need one or two doses if you smoke cigarettes or if you have certain conditions. Meningococcal conjugate (MenACWY) vaccine  You may need this if you have certain conditions. Hepatitis A vaccine  You may need this if you have certain conditions or if you travel or work in places where you may be exposed to hepatitis A. Hepatitis B vaccine  You may need this if you have certain conditions or if you travel or work in places where you may be exposed to hepatitis B. Haemophilus influenzae type b (Hib) vaccine  You may need this if you have certain risk factors. Human papillomavirus (HPV) vaccine  If recommended by your health care provider, you may need three doses over 6 months. You may receive vaccines as individual doses or as more than one vaccine together in one shot (combination vaccines). Talk with your health care provider  about the risks and benefits of combination vaccines. What tests do I need? Blood tests  Lipid and cholesterol levels. These may be checked every 5 years, or more frequently if you are over 1 years old.  Hepatitis C test.  Hepatitis B test. Screening  Lung cancer screening. You may have this screening every year starting at age 74 if you have a 30-pack-year history of smoking and currently smoke or have quit within the past 15 years.  Prostate cancer screening. Recommendations will vary depending on your family history and other risks.  Colorectal cancer screening. All adults should have this screening starting at age 27 and continuing until age 57. Your health care provider may recommend screening at age 52 if you are at increased risk. You will have tests every 1-10 years, depending on your results and the type of screening test.  Diabetes screening. This is done by checking your blood sugar (glucose) after you have not eaten for a while (fasting). You may have this done every 1-3 years.  Sexually transmitted disease (STD) testing. Follow these instructions at home: Eating and drinking  Eat a diet that includes fresh fruits and vegetables, whole grains, lean protein, and low-fat dairy products.  Take vitamin and mineral supplements as recommended by your health care provider.  Do not drink alcohol if your health care provider tells you not to drink.  If you drink alcohol: ? Limit how much you have to 0-2 drinks a day. ? Be aware of how much alcohol is in your drink. In the [place] one drink equals one 12 oz bottle of beer (355 mL), one 5 oz glass of wine (148 mL), or one 1 oz glass of hard liquor (44 mL). Lifestyle  Take daily care of your teeth and gums.  Stay active. Exercise for at least 30 minutes on 5 or more days each week.  Do not use any products that contain nicotine or tobacco, such as cigarettes, e-cigarettes, and chewing tobacco. If you need help quitting, ask your  health care provider.  If you are sexually active, practice safe sex. Use a condom or other form of protection to  prevent STIs (sexually transmitted infections).  Talk with your health care provider about taking a low-dose aspirin every day starting at age 57. What's next?  Go to your health care provider once a year for a well check visit.  Ask your health care provider how often you should have your eyes and teeth checked.  Stay up to date on all vaccines. This information is not intended to replace advice given to you by your health care provider. Make sure you discuss any questions you have with your health care provider. Document Released: 05/30/2015 Document Revised: 04/27/2018 Document Reviewed: 04/27/2018 Elsevier Patient Education  2020 [employer].

## 2019-03-09 ENCOUNTER — Encounter: Payer: Self-pay | Admitting: *Deleted

## 2019-04-09 ENCOUNTER — Other Ambulatory Visit: Payer: Self-pay

## 2019-04-10 MED ORDER — JARDIANCE 25 MG PO TABS: 25.0000 mg | ORAL_TABLET | Freq: Every day | ORAL | 0 refills | Status: DC

## 2019-04-30 ENCOUNTER — Other Ambulatory Visit: Payer: Self-pay

## 2019-05-01 ENCOUNTER — Other Ambulatory Visit: Payer: Self-pay

## 2019-05-01 MED ORDER — GLIPIZIDE 10 MG PO TABS: ORAL_TABLET | ORAL | 1 refills | Status: DC

## 2019-05-01 NOTE — Telephone Encounter (Signed)
Pt calling to check status. Pt states that he is out of medication. Please advise

## 2019-05-24 ENCOUNTER — Other Ambulatory Visit: Payer: Self-pay | Admitting: Emergency Medicine

## 2019-05-24 DIAGNOSIS — K760 Fatty (change of) liver, not elsewhere classified: Secondary | ICD-10-CM

## 2019-05-24 DIAGNOSIS — E1140 Type 2 diabetes mellitus with diabetic neuropathy, unspecified: Secondary | ICD-10-CM

## 2019-05-24 DIAGNOSIS — IMO0002 Reserved for concepts with insufficient information to code with codable children: Secondary | ICD-10-CM

## 2019-05-25 MED ORDER — TRULICITY 1.5 MG/0.5ML ~~LOC~~ SOAJ: SUBCUTANEOUS | 1 refills | Status: DC

## 2019-06-01 ENCOUNTER — Ambulatory Visit: Payer: 59 | Admitting: Family Medicine

## 2019-06-06 ENCOUNTER — Ambulatory Visit: Payer: 59 | Admitting: Family Medicine

## 2019-06-06 ENCOUNTER — Encounter: Payer: Self-pay | Admitting: Family Medicine

## 2019-06-06 ENCOUNTER — Other Ambulatory Visit: Payer: Self-pay

## 2019-06-06 VITALS — BP 122/74 | HR 93 | Temp 97.3°F | Resp 16 | Ht 74.0 in | Wt 271.1 lb

## 2019-06-06 DIAGNOSIS — E782 Mixed hyperlipidemia: Secondary | ICD-10-CM | POA: Diagnosis not present

## 2019-06-06 DIAGNOSIS — I10 Essential (primary) hypertension: Secondary | ICD-10-CM

## 2019-06-06 DIAGNOSIS — E1165 Type 2 diabetes mellitus with hyperglycemia: Secondary | ICD-10-CM

## 2019-06-06 DIAGNOSIS — I700 Atherosclerosis of aorta: Secondary | ICD-10-CM

## 2019-06-06 DIAGNOSIS — IMO0002 Reserved for concepts with insufficient information to code with codable children: Secondary | ICD-10-CM

## 2019-06-06 DIAGNOSIS — E559 Vitamin D deficiency, unspecified: Secondary | ICD-10-CM

## 2019-06-06 DIAGNOSIS — E669 Obesity, unspecified: Secondary | ICD-10-CM

## 2019-06-06 DIAGNOSIS — E1140 Type 2 diabetes mellitus with diabetic neuropathy, unspecified: Secondary | ICD-10-CM | POA: Diagnosis not present

## 2019-06-06 DIAGNOSIS — Z5181 Encounter for therapeutic drug level monitoring: Secondary | ICD-10-CM

## 2019-06-06 DIAGNOSIS — Z6834 Body mass index (BMI) 34.0-34.9, adult: Secondary | ICD-10-CM

## 2019-06-06 NOTE — Progress Notes (Signed)
Name: Marvin SOTOMAYOR Sr.   MRN: [id number]    DOB: Aug 13, 1955   Date:06/06/2019       Progress Note  Chief Complaint  Patient presents with  . Follow-up  . Diabetes  . Hyperlipidemia  . Hypertension     Subjective:   Marvin Duncan. is a 64 y.o. male, presents to clinic for routine follow up on the conditions listed above.  Diabetes Mellitus Type II: Currently managing with trulicity, glipizide, metformin and jardiance Pt notes good med compliance Pt has no SE from meds. Not checking CBGs at home.  No hypoglycemic episodes - he can "feel" when he gets below 90, and he complains of confusion sometimes - states hes starting to forget things more easily - does not check sugar when it happens, no sweats or agitation associated. Denies: Polyuria, polydipsia, polyphagia, vision changes Some neuropathy to b/l feet - no numbness, wounds or sores -he is thinking about seeing a podiatrist but declines referral today when I offer it to him  Recent pertinent labs: Lab Results  Component Value Date   HGBA1C 6.5 03/01/2019   HGBA1C 7.6 (H) 11/30/2018   HGBA1C 6.6 (H) 06/26/2018   Current diet: in general, a "healthy" diet, low carb/sugar diet Done today - DM foot exam and has appt for eye exam in the next couple weeks ACEI/ARB: Yes Statin: Yes  Hyperlipidemia: Current Medication Regimen:  crestor 20 mg every night Last Lipids: Lab Results  Component Value Date   CHOL 109 11/30/2018   HDL 39 (L) 11/30/2018   LDLCALC 56 11/30/2018   TRIG 71 11/30/2018   CHOLHDL 2.8 11/30/2018  - Denies: Chest pain, shortness of breath, myalgias. - Documented aortic atherosclerosis? Yes - Risk factors for atherosclerosis: diabetes mellitus, hypercholesterolemia and hypertension  Hypertension:  Currently managed on losartan and norvasc Pt reports good med compliance and denies any SE.  No lightheadedness, hypotension, syncope. Blood pressure today is well controlled. BP Readings from Last 3  Encounters:  06/06/19 122/74  03/01/19 128/78  11/29/18 116/64   Pt denies CP, SOB, exertional sx, LE edema, palpitation, Ha's, visual disturbances Dietary efforts for BP?  Healthy, low salt   Tizanidine - prn for muscle spasms due to DDD and intermittent sciatica and "piriformis" - he uses zanaflex and would like a refill for when it flares up   Patient Active Problem List   Diagnosis Date Noted  . Thoracic degenerative disc disease 11/29/2018  . Insomnia 11/29/2018  . Fatty liver disease, nonalcoholic 0000000  . Atherosclerosis of abdominal aorta ([place]) 08/01/2018  . Adrenal tumor 07/26/2018  . Benign prostatic hyperplasia with urinary frequency 07/21/2018  . Atelectasis of left lung 06/26/2018  . Obesity (BMI 30.0-34.9) 04/17/2018  . Ventral hernia without obstruction or gangrene 05/13/2016  . GERD (gastroesophageal reflux disease) 01/04/2016  . DM type 2, uncontrolled, with neuropathy ([place]) 03/06/2015  . Erectile dysfunction due to arterial insufficiency 10/24/2014  . Hyperlipidemia 05/23/2014  . Sleep apnea 05/23/2014  . Benign essential HTN 02/14/2007    Past Surgical History:  Procedure Laterality Date  . AV FISTULA REPAIR    . CARDIAC CATHETERIZATION     [facility]   . COLONOSCOPY  2013    Family History  Problem Relation Age of Onset  . Breast cancer Mother   . Cancer Mother   . Kidney failure Father   . Diabetes Father   . Alcohol abuse Father   . Diabetes Sister   . Other Daughter  Social History   Socioeconomic History  . Marital status: Married    Spouse name: Marvin Duncan  . Number of children: 3  . Years of education: Not on file  . Highest education level: Some college, no degree  Occupational History  . Not on file  Tobacco Use  . Smoking status: Former Smoker    Packs/day: 0.25    Years: 25.00    Pack years: 6.25    Types: Cigarettes    Quit date: 05/17/1998    Years since quitting: 21.0  . Smokeless tobacco: Never Used  . Tobacco comment:  off and on - maybe 5 cigarettes  Substance and Sexual Activity  . Alcohol use: No    Alcohol/week: 0.0 standard drinks    Comment: previously but not currently  . Drug use: No  . Sexual activity: Yes    Partners: Female  Other Topics Concern  . Not on file  Social History Narrative  . Not on file   Social Determinants of Health   Financial Resource Strain: Medium Risk  . Difficulty of Paying Living Expenses: Somewhat hard  Food Insecurity: No Food Insecurity  . Worried About Charity fundraiser in the Last Year: Never true  . Ran Out of Food in the Last Year: Never true  Transportation Needs: No Transportation Needs  . Lack of Transportation (Medical): No  . Lack of Transportation (Non-Medical): No  Physical Activity: Inactive  . Days of Exercise per Week: 0 days  . Minutes of Exercise per Session: 0 min  Stress: No Stress Concern Present  . Feeling of Stress : Only a little  Social Connections: Slightly Isolated  . Frequency of Communication with Friends and Family: Twice a week  . Frequency of Social Gatherings with Friends and Family: Never  . Attends Religious Services: 1 to 4 times per year  . Active Member of Clubs or Organizations: Yes  . Attends Archivist Meetings: 1 to 4 times per year  . Marital Status: Married  Human resources officer Violence: Not At Risk  . Fear of Current or Ex-Partner: No  . Emotionally Abused: No  . Physically Abused: No  . Sexually Abused: No     Current Outpatient Medications:  .  amLODipine (NORVASC) 10 MG tablet, Take 1 tablet (10 mg total) by mouth daily., Disp: 90 tablet, Rfl: 1 .  aspirin 81 MG tablet, Take 81 mg by mouth daily., Disp: , Rfl:  .  Cholecalciferol (VITAMIN D) 2000 UNITS tablet, Take 2,000 Units by mouth daily., Disp: , Rfl:  .  Dulaglutide (TRULICITY) 1.5 0000000 SOPN, Inject contents of one syringe once a week, Disp: 12 pen, Rfl: 1 .  empagliflozin (JARDIANCE) 25 MG TABS tablet, Take 25 mg by mouth daily.,  Disp: 90 tablet, Rfl: 0 .  Flaxseed, Linseed, (FLAXSEED OIL) 1000 MG CAPS, Take 1,000 mg by mouth daily. , Disp: , Rfl:  .  fluticasone (FLONASE) 50 MCG/ACT nasal spray, USE 2 SPRAYS IN EACH  NOSTRIL DAILY (IF NEEDED  FOR ALLERGY SYMPTOMS), Disp: 48 g, Rfl: 0 .  glipiZIDE (GLUCOTROL) 10 MG tablet, TAKE 1 TABLET BY MOUTH TWICE DAILY BEFORE A MEAL FOR HIGH SUGARS, Disp: 180 tablet, Rfl: 1 .  Insulin Pen Needle 32G X 6 MM MISC, 1 Package by Does not apply route once., Disp: 100 each, Rfl: 3 .  losartan (COZAAR) 100 MG tablet, Take 1 tablet (100 mg total) by mouth daily., Disp: 90 tablet, Rfl: 1 .  metFORMIN (GLUCOPHAGE) 1000 MG tablet,  Take 1 tablet (1,000 mg total) by mouth 2 (two) times daily with a meal., Disp: 180 tablet, Rfl: 1 .  Omega-3 Fatty Acids (FISH OIL) 1000 MG CAPS, Take 1,000 mg by mouth daily., Disp: , Rfl:  .  rosuvastatin (CRESTOR) 20 MG tablet, Take 1 tablet (20 mg total) by mouth daily., Disp: 90 tablet, Rfl: 1 .  tiZANidine (ZANAFLEX) 2 MG tablet, Take 1 tablet (2 mg total) by mouth every 8 (eight) hours as needed for muscle spasms (back pain)., Disp: 30 tablet, Rfl: 0 .  traZODone (DESYREL) 100 MG tablet, Take 1 tablet (100 mg total) by mouth at bedtime as needed. for sleep, Disp: 90 tablet, Rfl: 1 .  vitamin B-12 (CYANOCOBALAMIN) 1000 MCG tablet, Take 1,000 mcg by mouth daily., Disp: , Rfl:   Allergies  Allergen Reactions  . Atorvastatin Other (See Comments)    Statins cause headaches and muscle aches    Chart Review Today: I personally reviewed active problem list, medication list, allergies, family history, social history, health maintenance, notes from last encounter, lab results, imaging with the patient/caregiver today.   Review of Systems  Constitutional: Negative.   HENT: Negative.   Eyes: Negative.   Respiratory: Negative.   Cardiovascular: Negative.   Gastrointestinal: Negative.   Endocrine: Negative.   Genitourinary: Negative.   Musculoskeletal: Negative.    Skin: Negative.   Allergic/Immunologic: Negative.   Neurological: Negative.   Hematological: Negative.   Psychiatric/Behavioral: Negative.   All other systems reviewed and are negative.    Objective:    Vitals:   06/06/19 0800  BP: 122/74  Pulse: 93  Resp: 16  Temp: (!) 97.3 F (36.3 C)  TempSrc: Temporal  SpO2: 96%  Weight: 271 lb 1.6 oz (123 kg)  Height: 6\' 2"  (1.88 m)    Body mass index is 34.81 kg/m.  Physical Exam Vitals and nursing note reviewed.  Constitutional:      General: He is not in acute distress.    Appearance: Normal appearance. He is well-developed. He is obese. He is not ill-appearing, toxic-appearing or diaphoretic.     Interventions: Face mask in place.  HENT:     Head: Normocephalic and atraumatic.     Jaw: No trismus.     Right Ear: External ear normal.     Left Ear: External ear normal.  Eyes:     General: Lids are normal. No scleral icterus.    Conjunctiva/sclera: Conjunctivae normal.     Pupils: Pupils are equal, round, and reactive to light.  Neck:     Trachea: Trachea and phonation normal. No tracheal deviation.  Cardiovascular:     Rate and Rhythm: Normal rate and regular rhythm.     Pulses: Normal pulses.          Radial pulses are 2+ on the right side and 2+ on the left side.       Posterior tibial pulses are 2+ on the right side and 2+ on the left side.     Heart sounds: Normal heart sounds. No murmur. No friction rub. No gallop.   Pulmonary:     Effort: Pulmonary effort is normal. No respiratory distress.     Breath sounds: Normal breath sounds. No stridor. No wheezing, rhonchi or rales.  Abdominal:     General: Bowel sounds are normal. There is no distension.     Palpations: Abdomen is soft.     Tenderness: There is no abdominal tenderness. There is no guarding or rebound.  Musculoskeletal:  General: Normal range of motion.     Cervical back: Normal range of motion and neck supple.     Right lower leg: No edema.      Left lower leg: No edema.  Skin:    General: Skin is warm and dry.     Capillary Refill: Capillary refill takes less than 2 seconds.     Coloration: Skin is not jaundiced.     Findings: No rash.     Nails: There is no clubbing.  Neurological:     Mental Status: He is alert.     Cranial Nerves: No dysarthria or facial asymmetry.     Motor: No tremor or abnormal muscle tone.     Gait: Gait normal.  Psychiatric:        Mood and Affect: Mood normal.        Speech: Speech normal.        Behavior: Behavior normal. Behavior is cooperative.       Diabetic Foot Exam: Diabetic Foot Exam - Simple   Simple Foot Form Diabetic Foot exam was performed with the following findings: Yes 06/06/2019  8:30 AM  Visual Inspection Sensation Testing Pulse Check Comments     PHQ2/9: Depression screen [facility] 2/9 06/06/2019 03/01/2019 11/29/2018 08/02/2018 06/26/2018  Decreased Interest 0 0 0 0 0  Down, Depressed, Hopeless 0 0 0 0 0  PHQ - 2 Score 0 0 0 0 0  Altered sleeping 0 2 0 0 0  Tired, decreased energy 1 0 0 0 0  Change in appetite 0 0 0 0 0  Feeling bad or failure about yourself  0 0 0 0 0  Trouble concentrating 0 0 0 0 0  Moving slowly or fidgety/restless 0 0 0 0 0  Suicidal thoughts 0 0 0 0 0  PHQ-9 Score 1 2 0 0 0  Difficult doing work/chores Not difficult at all Not difficult at all Not difficult at all Not difficult at all Not difficult at all  Some recent data might be hidden    phq 9 is neg, reviewed today, no action needed  Fall Risk: Fall Risk  06/06/2019 03/01/2019 11/29/2018 08/02/2018 07/31/2018  Falls in the past year? 0 0 0 0 0  Number falls in past yr: 0 0 0 0 0  Injury with Fall? 0 0 0 0 0  Follow up - Falls evaluation completed - - -    Functional Status Survey: Is the patient deaf or have difficulty hearing?: No Does the patient have difficulty seeing, even when wearing glasses/contacts?: No Does the patient have difficulty concentrating, remembering, or making  decisions?: Yes(remembering) Does the patient have difficulty walking or climbing stairs?: No Does the patient have difficulty dressing or bathing?: No Does the patient have difficulty doing errands alone such as visiting a doctor's office or shopping?: No   Assessment & Plan:     ICD-10-CM   1. DM type 2, uncontrolled, with neuropathy ([place])  E11.40 Hemoglobin A1c   E11.65 POCT UA - Microalbumin    Comprehensive metabolic panel   stable, well controlled, neuropathy worsening, discussed meds to manage or podiatry referral, pt declined for now  2. Mixed hyperlipidemia  E78.2 Lipid panel    Comprehensive metabolic panel   compliant with statin, has been well controlled, no myalgias, he's concerned statin may be causing some mild forgetfullness, doubt - check cbg first  3. Benign essential HTN  I10 Comprehensive metabolic panel   stable, well controlled  4. Atherosclerosis of  abdominal aorta ([facility])  I70.0    monitoring, managing with statin, controlling BP and DM  5. Vitamin D deficiency  E55.9 VITAMIN D 25 Hydroxy (Vit-D Deficiency, Fractures)   recheck labs  6. Class 1 obesity with body mass index (BMI) of 34.0 to 34.9 in adult, unspecified obesity type, unspecified whether serious comorbidity present  E66.9    Z68.34    weight up about 8 lbs over 3 months, encouraged and discussed diet exercise and lifestyle efforts  7. Encounter for medication monitoring  Z51.81 CBC with Differential/Platelet    Lipid panel    Hemoglobin A1c    VITAMIN D 25 Hydroxy (Vit-D Deficiency, Fractures)    POCT UA - Microalbumin    Comprehensive metabolic panel       Return in about 4 months (around 10/04/2019) for routine f/up.   Delsa Grana, PA-C 06/06/19 8:17 AM

## 2019-06-06 NOTE — Patient Instructions (Signed)
Please check your morning fasting blood sugars to see that they are in range and not too low or too high.  Goal would be 80-130 in the morning before eating. If your blood sugars are low or you are not eating, I recommend HOLDING your glipizide medication because it can drop you and cause you to feel bad or forgetful.

## 2019-06-07 LAB — CBC WITH DIFFERENTIAL/PLATELET
Basophils Absolute: 0.1 10*3/uL (ref 0.0–0.2)
Basos: 1 %
EOS (ABSOLUTE): 0.1 10*3/uL (ref 0.0–0.4)
Eos: 1 %
Hematocrit: 44.2 % (ref 37.5–51.0)
Hemoglobin: 14.6 g/dL (ref 13.0–17.7)
Immature Grans (Abs): 0 10*3/uL (ref 0.0–0.1)
Immature Granulocytes: 0 %
Lymphocytes Absolute: 2.2 10*3/uL (ref 0.7–3.1)
Lymphs: 36 %
MCH: 28.9 pg (ref 26.6–33.0)
MCHC: 33 g/dL (ref 31.5–35.7)
MCV: 87 fL (ref 79–97)
Monocytes Absolute: 0.5 10*3/uL (ref 0.1–0.9)
Monocytes: 8 %
Neutrophils Absolute: 3.2 10*3/uL (ref 1.4–7.0)
Neutrophils: 54 %
Platelets: 258 10*3/uL (ref 150–450)
RBC: 5.06 x10E6/uL (ref 4.14–5.80)
RDW: 11.8 % (ref 11.6–15.4)
WBC: 5.9 10*3/uL (ref 3.4–10.8)

## 2019-06-07 LAB — COMPREHENSIVE METABOLIC PANEL
ALT: 13 IU/L (ref 0–44)
AST: 20 IU/L (ref 0–40)
Albumin/Globulin Ratio: 1.6 (ref 1.2–2.2)
Albumin: 4.4 g/dL (ref 3.8–4.8)
Alkaline Phosphatase: 77 IU/L (ref 39–117)
BUN/Creatinine Ratio: 10 (ref 10–24)
BUN: 13 mg/dL (ref 8–27)
Bilirubin Total: 0.4 mg/dL (ref 0.0–1.2)
CO2: 24 mmol/L (ref 20–29)
Calcium: 9.7 mg/dL (ref 8.6–10.2)
Chloride: 102 mmol/L (ref 96–106)
Creatinine, Ser: 1.33 mg/dL — ABNORMAL HIGH (ref 0.76–1.27)
GFR calc Af Amer: 66 mL/min/{1.73_m2} (ref >59)
GFR calc non Af Amer: 57 mL/min/{1.73_m2} — ABNORMAL LOW (ref >59)
Globulin, Total: 2.7 g/dL (ref 1.5–4.5)
Glucose: 133 mg/dL — ABNORMAL HIGH (ref 65–99)
Potassium: 4.6 mmol/L (ref 3.5–5.2)
Sodium: 139 mmol/L (ref 134–144)
Total Protein: 7.1 g/dL (ref 6.0–8.5)

## 2019-06-07 LAB — LIPID PANEL
Chol/HDL Ratio: 2.8 ratio (ref 0.0–5.0)
Cholesterol, Total: 119 mg/dL (ref 100–199)
HDL: 42 mg/dL (ref >39)
LDL Chol Calc (NIH): 62 mg/dL (ref 0–99)
Triglycerides: 71 mg/dL (ref 0–149)
VLDL Cholesterol Cal: 15 mg/dL (ref 5–40)

## 2019-06-07 LAB — VITAMIN D 25 HYDROXY (VIT D DEFICIENCY, FRACTURES): Vit D, 25-Hydroxy: 68.5 ng/mL (ref 30.0–100.0)

## 2019-06-07 LAB — HEMOGLOBIN A1C
Est. average glucose Bld gHb Est-mCnc: 148 mg/dL
Hgb A1c MFr Bld: 6.8 % — ABNORMAL HIGH (ref 4.8–5.6)

## 2019-06-08 ENCOUNTER — Other Ambulatory Visit: Payer: Self-pay | Admitting: Emergency Medicine

## 2019-06-08 DIAGNOSIS — I700 Atherosclerosis of aorta: Secondary | ICD-10-CM

## 2019-06-08 DIAGNOSIS — E1140 Type 2 diabetes mellitus with diabetic neuropathy, unspecified: Secondary | ICD-10-CM

## 2019-06-08 DIAGNOSIS — IMO0002 Reserved for concepts with insufficient information to code with codable children: Secondary | ICD-10-CM

## 2019-06-08 DIAGNOSIS — I10 Essential (primary) hypertension: Secondary | ICD-10-CM

## 2019-06-08 MED ORDER — LOSARTAN POTASSIUM 100 MG PO TABS: 100.0000 mg | ORAL_TABLET | Freq: Every day | ORAL | 1 refills | Status: DC

## 2019-06-08 MED ORDER — AMLODIPINE BESYLATE 10 MG PO TABS: 10.0000 mg | ORAL_TABLET | Freq: Every day | ORAL | 1 refills | Status: DC

## 2019-06-13 ENCOUNTER — Other Ambulatory Visit: Payer: Self-pay | Admitting: Family Medicine

## 2019-06-13 DIAGNOSIS — IMO0002 Reserved for concepts with insufficient information to code with codable children: Secondary | ICD-10-CM

## 2019-06-13 DIAGNOSIS — E1140 Type 2 diabetes mellitus with diabetic neuropathy, unspecified: Secondary | ICD-10-CM

## 2019-06-13 MED ORDER — METFORMIN HCL 1000 MG PO TABS: 1000.0000 mg | ORAL_TABLET | Freq: Two times a day (BID) | ORAL | 1 refills | Status: DC

## 2019-06-13 NOTE — Telephone Encounter (Signed)
Medication Refill - Medication: metformin  Has the patient contacted their pharmacy? Yes.   (Agent: If no, request that the patient contact the pharmacy for the refill.) (Agent: If yes, when and what did the pharmacy advise?)  Preferred Pharmacy (with phone number or street name):  [facility] [name], [place]  [place] [address]  Phone: [phone] Fax: [phone]  Not a 24 hour pharmacy; exact hours not known.     Agent: Please be advised that RX refills may take up to 3 business days. We ask that you follow-up with your pharmacy.

## 2019-06-21 ENCOUNTER — Other Ambulatory Visit: Payer: Self-pay | Admitting: Family Medicine

## 2019-06-21 DIAGNOSIS — M5441 Lumbago with sciatica, right side: Secondary | ICD-10-CM

## 2019-06-21 NOTE — Telephone Encounter (Signed)
  Notes to clinic:  This med can not be delegated.    Requested Prescriptions  Pending Prescriptions Disp Refills   tiZANidine (ZANAFLEX) 2 MG tablet [Pharmacy Med Name: TIZANIDINE 2MG  TABLETS] 30 tablet 0    Sig: TAKE 1 TABLET(2 MG) BY MOUTH EVERY 8 HOURS AS NEEDED FOR MUSCLE SPASMS OR BACK PAIN      Not Delegated - Cardiovascular:  Alpha-2 Agonists - tizanidine Failed - 06/21/2019  8:30 PM      Failed - This refill cannot be delegated      Passed - Valid encounter within last 6 months    Recent Outpatient Visits           2 weeks ago DM type 2, uncontrolled, with neuropathy [facility])   [facility] Delsa Grana, PA-C   3 months ago Annual physical exam   [employer], FNP   6 months ago Benign essential HTN   [place], NP   10 months ago Chest pain with moderate risk for cardiac etiology   [place], NP   12 months ago Cough   [facility] Lada, Satira Anis, MD       Future Appointments             In 3 months Delsa Grana, PA-C [facility], [facility]

## 2019-07-04 ENCOUNTER — Other Ambulatory Visit: Payer: Self-pay | Admitting: Family Medicine

## 2019-07-04 NOTE — Telephone Encounter (Signed)
Requested medication (s) are due for refill today: no Requested medication (s) are on the active medication list: yes  Last refill:  04/10/2019  Future visit scheduled: yes  Notes to clinic:  Endocrinology: Diabetes - SGLT2 Inhibitors Failed   Requested Prescriptions  Pending Prescriptions Disp Refills   JARDIANCE 25 MG TABS tablet [Pharmacy Med Name: JARDIANCE 25MG TABLETS] 90 tablet 0    Sig: TAKE 1 TABLET BY MOUTH DAILY      Endocrinology:  Diabetes - SGLT2 Inhibitors Failed - 07/04/2019  3:28 PM      Failed - Cr in normal range and within 360 days    Creat  Date Value Ref Range Status  06/26/2018 1.15 0.70 - 1.25 mg/dL Final    Comment:    For patients >53 years of age, the reference limit for Creatinine is approximately 13% higher for people identified as African-American. .    Creatinine, Ser  Date Value Ref Range Status  06/06/2019 1.33 (H) 0.76 - 1.27 mg/dL Final   Creatinine, Urine  Date Value Ref Range Status  12/26/2015 113 20 - 370 mg/dL Final          Passed - LDL in normal range and within 360 days    LDL Cholesterol (Calc)  Date Value Ref Range Status  06/26/2018 83 mg/dL (calc) Final    Comment:    Reference range: <100 . Desirable range <100 mg/dL for primary prevention;   <70 mg/dL for patients with CHD or diabetic patients  with > or = 2 CHD risk factors. Marland Kitchen LDL-C is now calculated using the Martin-Hopkins  calculation, which is a validated novel method providing  better accuracy than the Friedewald equation in the  estimation of LDL-C.  Cresenciano Genre et al. Annamaria Helling. 2297;989(21): 2061-2068  (http://education.Quest[employer].com/faq/FAQ164)    LDL Chol Calc (NIH)  Date Value Ref Range Status  06/06/2019 62 0 - 99 mg/dL Final          Passed - HBA1C is between 0 and 7.9 and within 180 days    HbA1c, POC (controlled diabetic range)  Date Value Ref Range Status  03/01/2019 6.5 0.0 - 7.0 % Final   Hgb A1c MFr Bld  Date Value Ref Range Status   06/06/2019 6.8 (H) 4.8 - 5.6 % Final    Comment:             Prediabetes: 5.7 - 6.4          Diabetes: >6.4          Glycemic control for adults with diabetes: <7.0           Passed - eGFR in normal range and within 360 days    GFR, Est African American  Date Value Ref Range Status  06/26/2018 79 > OR = 60 mL/min/1.41m Final   GFR calc Af Amer  Date Value Ref Range Status  06/06/2019 66 >59 mL/min/1.73 Final   GFR, Est Non African American  Date Value Ref Range Status  06/26/2018 68 > OR = 60 mL/min/1.781mFinal   GFR calc non Af Amer  Date Value Ref Range Status  06/06/2019 57 (L) >59 mL/min/1.73 Final          Passed - Valid encounter within last 6 months    Recent Outpatient Visits           4 weeks ago DM type 2, uncontrolled[facility]   4 months ago Annual physical  exam   [facility] [place], FNP   7 months ago Benign essential HTN   [place], NP   11 months ago Chest pain with moderate risk for cardiac etiology   [place], NP   1 year ago Cough   [facility] Lada, Satira Anis, MD       Future Appointments             In 3 months Delsa Grana, PA-C [facility], [facility]

## 2019-08-02 ENCOUNTER — Other Ambulatory Visit: Payer: Self-pay

## 2019-08-02 DIAGNOSIS — G4700 Insomnia, unspecified: Secondary | ICD-10-CM

## 2019-08-02 MED ORDER — TRAZODONE HCL 100 MG PO TABS: 100.0000 mg | ORAL_TABLET | Freq: Every evening | ORAL | 1 refills | Status: DC | PRN

## 2019-08-02 NOTE — Telephone Encounter (Signed)
LVM informing patient that rx has been sent to his pharmacy, [facility]

## 2019-08-31 ENCOUNTER — Other Ambulatory Visit: Payer: Self-pay | Admitting: Family Medicine

## 2019-08-31 DIAGNOSIS — K760 Fatty (change of) liver, not elsewhere classified: Secondary | ICD-10-CM

## 2019-08-31 DIAGNOSIS — E1140 Type 2 diabetes mellitus with diabetic neuropathy, unspecified: Secondary | ICD-10-CM

## 2019-08-31 DIAGNOSIS — IMO0002 Reserved for concepts with insufficient information to code with codable children: Secondary | ICD-10-CM

## 2019-08-31 NOTE — Telephone Encounter (Signed)
Copied from [place] [phone]. Topic: Quick Communication - Rx Refill/Question >> Aug 31, 2019  9:41 AM Rainey Pines A wrote: Medication:Dulaglutide (TRULICITY) 1.5 0000000 SOPN (Patient stated that prior authorization is needed on medication and would like a callback once pa is completed.)  Has the patient contacted their pharmacy? Yes (Agent: If no, request that the patient contact the pharmacy for the refill.) (Agent: If yes, when and what did the pharmacy advise?)Contact PCP  Preferred Pharmacy (with phone number or street name): [facility] [name], [place]  Phone:  [phone] Fax:  [phone]     Agent: Please be advised that RX refills may take up to 3 business days. We ask that you follow-up with your pharmacy.

## 2019-08-31 NOTE — Telephone Encounter (Signed)
Patient states this medication will need prior authorization- sent to office for review of request

## 2019-09-03 NOTE — Telephone Encounter (Signed)
Have you seen a PA

## 2019-09-07 NOTE — Telephone Encounter (Signed)
Trulicity denied. Can something else be called in

## 2019-09-12 NOTE — Telephone Encounter (Signed)
Called patient NA. Need patient to call insurance company to see if they will give him a list of what is covered. Pharmacy will not give this information

## 2019-09-13 ENCOUNTER — Other Ambulatory Visit: Payer: Self-pay | Admitting: General Practice

## 2019-09-13 DIAGNOSIS — E1140 Type 2 diabetes mellitus with diabetic neuropathy, unspecified: Secondary | ICD-10-CM

## 2019-09-13 DIAGNOSIS — K760 Fatty (change of) liver, not elsewhere classified: Secondary | ICD-10-CM

## 2019-09-13 DIAGNOSIS — IMO0002 Reserved for concepts with insufficient information to code with codable children: Secondary | ICD-10-CM

## 2019-09-13 MED ORDER — TRULICITY 1.5 MG/0.5ML ~~LOC~~ SOAJ: SUBCUTANEOUS | 1 refills | Status: DC

## 2019-09-13 NOTE — Telephone Encounter (Signed)
Call to patient - left message on self verified VM- need to verify [facility] if pharmacy of choice for insurance- medication is approved- but has to use specific pharmacy. Left message for patient to call back.

## 2019-09-13 NOTE — Telephone Encounter (Signed)
RX REFILL Dulaglutide (TRULICITY) 1.5 0000000 SOPN [id number]   PHARMACY [facility] [name], [place] Phone:  [phone]  Fax:  [phone]

## 2019-10-04 ENCOUNTER — Encounter: Payer: Self-pay | Admitting: Family Medicine

## 2019-10-04 ENCOUNTER — Ambulatory Visit: Payer: 59 | Admitting: Family Medicine

## 2019-10-04 ENCOUNTER — Other Ambulatory Visit: Payer: Self-pay

## 2019-10-04 VITALS — BP 124/70 | HR 89 | Temp 98.0°F | Resp 14 | Ht 74.0 in | Wt 263.0 lb

## 2019-10-04 DIAGNOSIS — Z5181 Encounter for therapeutic drug level monitoring: Secondary | ICD-10-CM

## 2019-10-04 DIAGNOSIS — I10 Essential (primary) hypertension: Secondary | ICD-10-CM | POA: Diagnosis not present

## 2019-10-04 DIAGNOSIS — G4700 Insomnia, unspecified: Secondary | ICD-10-CM

## 2019-10-04 DIAGNOSIS — J309 Allergic rhinitis, unspecified: Secondary | ICD-10-CM

## 2019-10-04 DIAGNOSIS — E782 Mixed hyperlipidemia: Secondary | ICD-10-CM | POA: Diagnosis not present

## 2019-10-04 DIAGNOSIS — E1140 Type 2 diabetes mellitus with diabetic neuropathy, unspecified: Secondary | ICD-10-CM

## 2019-10-04 DIAGNOSIS — I700 Atherosclerosis of aorta: Secondary | ICD-10-CM | POA: Diagnosis not present

## 2019-10-04 DIAGNOSIS — E1165 Type 2 diabetes mellitus with hyperglycemia: Secondary | ICD-10-CM

## 2019-10-04 DIAGNOSIS — IMO0002 Reserved for concepts with insufficient information to code with codable children: Secondary | ICD-10-CM

## 2019-10-04 MED ORDER — LOSARTAN POTASSIUM 100 MG PO TABS: 100.0000 mg | ORAL_TABLET | Freq: Every day | ORAL | 3 refills | Status: DC

## 2019-10-04 MED ORDER — TRAZODONE HCL 100 MG PO TABS: 100.0000 mg | ORAL_TABLET | Freq: Every evening | ORAL | 3 refills | Status: DC | PRN

## 2019-10-04 MED ORDER — ROSUVASTATIN CALCIUM 20 MG PO TABS: 20.0000 mg | ORAL_TABLET | Freq: Every day | ORAL | 3 refills | Status: DC

## 2019-10-04 MED ORDER — METFORMIN HCL 1000 MG PO TABS: 1000.0000 mg | ORAL_TABLET | Freq: Two times a day (BID) | ORAL | 3 refills | Status: DC

## 2019-10-04 MED ORDER — AMLODIPINE BESYLATE 10 MG PO TABS: 10.0000 mg | ORAL_TABLET | Freq: Every day | ORAL | 3 refills | Status: DC

## 2019-10-04 MED ORDER — GLIPIZIDE 10 MG PO TABS: ORAL_TABLET | ORAL | 3 refills | Status: DC

## 2019-10-04 MED ORDER — FLUTICASONE PROPIONATE 50 MCG/ACT NA SUSP: NASAL | 3 refills | Status: DC

## 2019-10-04 NOTE — Progress Notes (Signed)
Name: Marvin MCILROY Sr.   MRN: [id number]    DOB: 1955/05/29   Date:10/04/2019       Progress Note  Chief Complaint  Patient presents with  . Follow-up  . Diabetes  . Hypertension  . Hyperlipidemia  . Insomnia     Subjective:   Marvin Forestier. is a 64 y.o. male, presents to clinic for routine follow up on the conditions listed above.  DM - managed with glipizide, trulicity, jardiance and metformin, compliant with meds, no highs or lows, DM has been well controlled, was 6.8 in Jan 2021.  HTN-  Well controlled today, pt managed with norvasc 10 mg, losartan 100 mg  HLD- on crestor 20 mg and omega-3 fatty acid supplement.  Pt notes good daily med compliance, denies myalgias, CP, SOB, claudication sx.  Insomnia managed on trazodone 100 mg daily  Patient Active Problem List   Diagnosis Date Noted  . Thoracic degenerative disc disease 11/29/2018  . Insomnia 11/29/2018  . Fatty liver disease, nonalcoholic 0000000  . Atherosclerosis of abdominal aorta ([place]) 08/01/2018  . Adrenal tumor 07/26/2018  . Benign prostatic hyperplasia with urinary frequency 07/21/2018  . Atelectasis of left lung 06/26/2018  . Obesity (BMI 30.0-34.9) 04/17/2018  . Ventral hernia without obstruction or gangrene 05/13/2016  . GERD (gastroesophageal reflux disease) 01/04/2016  . DM type 2, uncontrolled, with neuropathy ([address]) 03/06/2015  . Erectile dysfunction due to arterial insufficiency 10/24/2014  . Hyperlipidemia 05/23/2014  . Sleep apnea 05/23/2014  . Benign essential HTN 02/14/2007    Past Surgical History:  Procedure Laterality Date  . AV FISTULA REPAIR    . CARDIAC CATHETERIZATION     [facility]   . COLONOSCOPY  2013    Family History  Problem Relation Age of Onset  . Breast cancer Mother   . Cancer Mother   . Kidney failure Father   . Diabetes Father   . Alcohol abuse Father   . Diabetes Sister   . Other Daughter     Social History   Tobacco Use  . Smoking status: Former Smoker     Packs/day: 0.25    Years: 25.00    Pack years: 6.25    Types: Cigarettes    Quit date: 05/17/1998    Years since quitting: 21.3  . Smokeless tobacco: Never Used  . Tobacco comment: off and on - maybe 5 cigarettes  Substance Use Topics  . Alcohol use: No    Alcohol/week: 0.0 standard drinks    Comment: previously but not currently  . Drug use: No      Current Outpatient Medications:  .  amLODipine (NORVASC) 10 MG tablet, Take 1 tablet (10 mg total) by mouth daily., Disp: 90 tablet, Rfl: 1 .  aspirin 81 MG tablet, Take 81 mg by mouth daily., Disp: , Rfl:  .  Cholecalciferol (VITAMIN D) 2000 UNITS tablet, Take 2,000 Units by mouth daily., Disp: , Rfl:  .  Dulaglutide (TRULICITY) 1.5 0000000 SOPN, Inject contents of one syringe once a week, Disp: 12 pen, Rfl: 1 .  Flaxseed, Linseed, (FLAXSEED OIL) 1000 MG CAPS, Take 1,000 mg by mouth daily. , Disp: , Rfl:  .  fluticasone (FLONASE) 50 MCG/ACT nasal spray, USE 2 SPRAYS IN EACH  NOSTRIL DAILY (IF NEEDED  FOR ALLERGY SYMPTOMS), Disp: 48 g, Rfl: 0 .  glipiZIDE (GLUCOTROL) 10 MG tablet, TAKE 1 TABLET BY MOUTH TWICE DAILY BEFORE A MEAL FOR HIGH SUGARS, Disp: 180 tablet, Rfl: 1 .  JARDIANCE 25  MG TABS tablet, TAKE 1 TABLET BY MOUTH DAILY, Disp: 90 tablet, Rfl: 3 .  losartan (COZAAR) 100 MG tablet, Take 1 tablet (100 mg total) by mouth daily., Disp: 90 tablet, Rfl: 1 .  metFORMIN (GLUCOPHAGE) 1000 MG tablet, Take 1 tablet (1,000 mg total) by mouth 2 (two) times daily with a meal., Disp: 180 tablet, Rfl: 1 .  Omega-3 Fatty Acids (FISH OIL) 1000 MG CAPS, Take 1,000 mg by mouth daily., Disp: , Rfl:  .  rosuvastatin (CRESTOR) 20 MG tablet, Take 1 tablet (20 mg total) by mouth daily., Disp: 90 tablet, Rfl: 1 .  traZODone (DESYREL) 100 MG tablet, Take 1 tablet (100 mg total) by mouth at bedtime as needed. for sleep, Disp: 90 tablet, Rfl: 1 .  vitamin B-12 (CYANOCOBALAMIN) 1000 MCG tablet, Take 1,000 mcg by mouth daily., Disp: , Rfl:  .  Insulin Pen  Needle 32G X 6 MM MISC, 1 Package by Does not apply route once., Disp: 100 each, Rfl: 3  Allergies  Allergen Reactions  . Atorvastatin Other (See Comments)    Statins cause headaches and muscle aches    Chart Review Today: I personally reviewed active problem list, medication list, allergies, family history, social history, health maintenance, notes from last encounter, lab results, imaging with the patient/caregiver today.   Review of Systems  10 Systems reviewed and are negative for acute change except as noted in the HPI.    Objective:    Vitals:   10/04/19 0831  BP: 124/70  Pulse: 89  Resp: 14  Temp: 98 F (36.7 C)  SpO2: 95%  Weight: 263 lb (119.3 kg)  Height: 6\' 2"  (1.88 m)    Body mass index is 33.77 kg/m.  Physical Exam Vitals and nursing note reviewed.  Constitutional:      General: He is not in acute distress.    Appearance: Normal appearance. He is well-developed. He is obese. He is not ill-appearing, toxic-appearing or diaphoretic.     Interventions: Face mask in place.  HENT:     Head: Normocephalic and atraumatic.     Jaw: No trismus.     Right Ear: External ear normal.     Left Ear: External ear normal.  Eyes:     General: Lids are normal. No scleral icterus.    Conjunctiva/sclera: Conjunctivae normal.     Pupils: Pupils are equal, round, and reactive to light.  Neck:     Trachea: Trachea and phonation normal. No tracheal deviation.  Cardiovascular:     Rate and Rhythm: Normal rate and regular rhythm.     Pulses: Normal pulses.          Radial pulses are 2+ on the right side and 2+ on the left side.       Posterior tibial pulses are 2+ on the right side and 2+ on the left side.     Heart sounds: Normal heart sounds. No murmur. No friction rub. No gallop.   Pulmonary:     Effort: Pulmonary effort is normal. No respiratory distress.     Breath sounds: Normal breath sounds. No stridor. No wheezing, rhonchi or rales.  Abdominal:     General:  Bowel sounds are normal. There is no distension.     Palpations: Abdomen is soft.     Tenderness: There is no abdominal tenderness. There is no guarding or rebound.  Musculoskeletal:        General: Normal range of motion.     Cervical back: Normal range  of motion and neck supple.     Right lower leg: No edema.     Left lower leg: No edema.  Skin:    General: Skin is warm and dry.     Capillary Refill: Capillary refill takes less than 2 seconds.     Coloration: Skin is not jaundiced.     Findings: No rash.     Nails: There is no clubbing.  Neurological:     Mental Status: He is alert.     Cranial Nerves: No dysarthria or facial asymmetry.     Motor: No tremor or abnormal muscle tone.     Gait: Gait normal.  Psychiatric:        Mood and Affect: Mood normal.        Speech: Speech normal.        Behavior: Behavior normal. Behavior is cooperative.       PHQ2/9: Depression screen [facility] 2/9 10/04/2019 06/06/2019 03/01/2019 11/29/2018 08/02/2018  Decreased Interest 0 0 0 0 0  Down, Depressed, Hopeless 0 0 0 0 0  PHQ - 2 Score 0 0 0 0 0  Altered sleeping 0 0 2 0 0  Tired, decreased energy 0 1 0 0 0  Change in appetite 0 0 0 0 0  Feeling bad or failure about yourself  0 0 0 0 0  Trouble concentrating 0 0 0 0 0  Moving slowly or fidgety/restless 0 0 0 0 0  Suicidal thoughts 0 0 0 0 0  PHQ-9 Score 0 1 2 0 0  Difficult doing work/chores Not difficult at all Not difficult at all Not difficult at all Not difficult at all Not difficult at all  Some recent data might be hidden    phq 9 is neg, reviewed today  Fall Risk: Fall Risk  10/04/2019 06/06/2019 03/01/2019 11/29/2018 08/02/2018  Falls in the past year? 0 0 0 0 0  Number falls in past yr: 0 0 0 0 0  Injury with Fall? 0 0 0 0 0  Follow up - - Falls evaluation completed - -    Functional Status Survey: Is the patient deaf or have difficulty hearing?: No Does the patient have difficulty seeing, even when wearing glasses/contacts?:  No Does the patient have difficulty concentrating, remembering, or making decisions?: No Does the patient have difficulty walking or climbing stairs?: No Does the patient have difficulty dressing or bathing?: No Does the patient have difficulty doing errands alone such as visiting a doctor's office or shopping?: No   Assessment & Plan:     ICD-10-CM   1. Benign essential HTN  I10 amLODipine (NORVASC) 10 MG tablet    losartan (COZAAR) 100 MG tablet    Comprehensive metabolic panel   stable, well controlled  2. Mixed hyperlipidemia  E78.2 rosuvastatin (CRESTOR) 20 MG tablet    Comprehensive metabolic panel   compliant with statin, no myalgias or SE  3. Atherosclerosis of abdominal aorta (HCC)  I70.0 amLODipine (NORVASC) 10 MG tablet    losartan (COZAAR) 100 MG tablet    rosuvastatin (CRESTOR) 20 MG tablet    Comprehensive metabolic panel   on statin, monitoring  4. DM type 2, uncontrolled, with neuropathy (HCC)  E11.40 losartan (COZAAR) 100 MG tablet   E11.65 rosuvastatin (CRESTOR) 20 MG tablet    metFORMIN (GLUCOPHAGE) 1000 MG tablet    glipiZIDE (GLUCOTROL) 10 MG tablet    Comprehensive metabolic panel    Hemoglobin A1c   has been well controlled, labs due today  5. Insomnia, unspecified type  G47.00 traZODone (DESYREL) 100 MG tablet   well controlled with trazodone - meds refilled today  6. Allergic rhinitis, unspecified seasonality, unspecified trigger  J30.9 fluticasone (FLONASE) 50 MCG/ACT nasal spray  7. Encounter for medication monitoring  Z51.81 Comprehensive metabolic panel    CBC with Differential/Platelet    Hemoglobin A1c     Return in about 6 months (around 04/05/2020).   Delsa Grana, PA-C 10/04/19 8:54 AM

## 2019-10-05 ENCOUNTER — Encounter: Payer: Self-pay | Admitting: Family Medicine

## 2019-10-05 LAB — COMPREHENSIVE METABOLIC PANEL
ALT: 10 IU/L (ref 0–44)
AST: 16 IU/L (ref 0–40)
Albumin/Globulin Ratio: 1.4 (ref 1.2–2.2)
Albumin: 4.2 g/dL (ref 3.8–4.8)
Alkaline Phosphatase: 82 IU/L (ref 48–121)
BUN/Creatinine Ratio: 11 (ref 10–24)
BUN: 13 mg/dL (ref 8–27)
Bilirubin Total: 0.5 mg/dL (ref 0.0–1.2)
CO2: 20 mmol/L (ref 20–29)
Calcium: 9.6 mg/dL (ref 8.6–10.2)
Chloride: 102 mmol/L (ref 96–106)
Creatinine, Ser: 1.23 mg/dL (ref 0.76–1.27)
GFR calc Af Amer: 72 mL/min/{1.73_m2} (ref >59)
GFR calc non Af Amer: 63 mL/min/{1.73_m2} (ref >59)
Globulin, Total: 3 g/dL (ref 1.5–4.5)
Glucose: 160 mg/dL — ABNORMAL HIGH (ref 65–99)
Potassium: 4.4 mmol/L (ref 3.5–5.2)
Sodium: 137 mmol/L (ref 134–144)
Total Protein: 7.2 g/dL (ref 6.0–8.5)

## 2019-10-05 LAB — CBC WITH DIFFERENTIAL/PLATELET
Basophils Absolute: 0.1 10*3/uL (ref 0.0–0.2)
Basos: 1 %
EOS (ABSOLUTE): 0.1 10*3/uL (ref 0.0–0.4)
Eos: 1 %
Hematocrit: 40.2 % (ref 37.5–51.0)
Hemoglobin: 13.2 g/dL (ref 13.0–17.7)
Immature Grans (Abs): 0 10*3/uL (ref 0.0–0.1)
Immature Granulocytes: 0 %
Lymphocytes Absolute: 1.8 10*3/uL (ref 0.7–3.1)
Lymphs: 32 %
MCH: 28.3 pg (ref 26.6–33.0)
MCHC: 32.8 g/dL (ref 31.5–35.7)
MCV: 86 fL (ref 79–97)
Monocytes Absolute: 0.5 10*3/uL (ref 0.1–0.9)
Monocytes: 9 %
Neutrophils Absolute: 3.1 10*3/uL (ref 1.4–7.0)
Neutrophils: 57 %
Platelets: 244 10*3/uL (ref 150–450)
RBC: 4.67 x10E6/uL (ref 4.14–5.80)
RDW: 11.4 % — ABNORMAL LOW (ref 11.6–15.4)
WBC: 5.5 10*3/uL (ref 3.4–10.8)

## 2019-10-05 LAB — HEMOGLOBIN A1C
Est. average glucose Bld gHb Est-mCnc: 197 mg/dL
Hgb A1c MFr Bld: 8.5 % — ABNORMAL HIGH (ref 4.8–5.6)

## 2019-12-10 ENCOUNTER — Other Ambulatory Visit: Payer: Self-pay

## 2019-12-10 DIAGNOSIS — K760 Fatty (change of) liver, not elsewhere classified: Secondary | ICD-10-CM

## 2019-12-10 DIAGNOSIS — E1140 Type 2 diabetes mellitus with diabetic neuropathy, unspecified: Secondary | ICD-10-CM

## 2019-12-10 DIAGNOSIS — IMO0002 Reserved for concepts with insufficient information to code with codable children: Secondary | ICD-10-CM

## 2019-12-10 MED ORDER — TRULICITY 1.5 MG/0.5ML ~~LOC~~ SOAJ: SUBCUTANEOUS | 1 refills | Status: DC

## 2019-12-17 MED ORDER — EMPAGLIFLOZIN 25 MG PO TABS: 25.0000 mg | ORAL_TABLET | Freq: Every day | ORAL | 1 refills | Status: DC

## 2019-12-17 NOTE — Addendum Note (Signed)
Addended by: Mliss Sax on: 12/17/2019 10:37 AM   Modules accepted: Orders

## 2019-12-17 NOTE — Telephone Encounter (Signed)
Pt called in and stated that [facility] never rec'd this med.  He would like to know if this can be resent? He also needs a refill on his JARDIANCE 25 MG TABS tablet [[id number]]  [facility] on [address]

## 2019-12-19 ENCOUNTER — Other Ambulatory Visit: Payer: Self-pay | Admitting: Family Medicine

## 2019-12-19 DIAGNOSIS — E1140 Type 2 diabetes mellitus with diabetic neuropathy, unspecified: Secondary | ICD-10-CM

## 2019-12-19 DIAGNOSIS — K760 Fatty (change of) liver, not elsewhere classified: Secondary | ICD-10-CM

## 2019-12-19 DIAGNOSIS — IMO0002 Reserved for concepts with insufficient information to code with codable children: Secondary | ICD-10-CM

## 2019-12-19 MED ORDER — TRULICITY 1.5 MG/0.5ML ~~LOC~~ SOAJ: SUBCUTANEOUS | 1 refills | Status: DC

## 2019-12-19 NOTE — Telephone Encounter (Signed)
Medication Refill - Medication: Dulaglutide (TRULICITY) 1.5 VA/4.4PE SOPN  Pt needs this sent submitted electronically to his pharmacy below. Please advise   Has the patient contacted their pharmacy? Yes.   (Agent: If no, request that the patient contact the pharmacy for the refill.) (Agent: If yes, when and what did the pharmacy advise?)  Preferred Pharmacy (with phone number or street name):  [name], [address]  [place] [address]  Phone: [phone] Fax: [phone]     Agent: Please be advised that RX refills may take up to 3 business days. We ask that you follow-up with your pharmacy.

## 2020-01-11 ENCOUNTER — Other Ambulatory Visit: Payer: Self-pay

## 2020-01-11 ENCOUNTER — Ambulatory Visit: Payer: Self-pay

## 2020-01-11 ENCOUNTER — Emergency Department
Admission: EM | Admit: 2020-01-11 | Discharge: 2020-01-11 | Disposition: A | Discharge status: Home / Self Care | Payer: No Typology Code available for payment source | Attending: Emergency Medicine | Admitting: Emergency Medicine

## 2020-01-11 ENCOUNTER — Emergency Department: Payer: No Typology Code available for payment source

## 2020-01-11 ENCOUNTER — Encounter: Payer: Self-pay | Admitting: Emergency Medicine

## 2020-01-11 DIAGNOSIS — E1165 Type 2 diabetes mellitus with hyperglycemia: Secondary | ICD-10-CM | POA: Insufficient documentation

## 2020-01-11 DIAGNOSIS — I10 Essential (primary) hypertension: Secondary | ICD-10-CM | POA: Diagnosis not present

## 2020-01-11 DIAGNOSIS — Z87891 Personal history of nicotine dependence: Secondary | ICD-10-CM | POA: Insufficient documentation

## 2020-01-11 DIAGNOSIS — Z79899 Other long term (current) drug therapy: Secondary | ICD-10-CM | POA: Diagnosis not present

## 2020-01-11 DIAGNOSIS — Z7982 Long term (current) use of aspirin: Secondary | ICD-10-CM | POA: Insufficient documentation

## 2020-01-11 DIAGNOSIS — Z7984 Long term (current) use of oral hypoglycemic drugs: Secondary | ICD-10-CM | POA: Insufficient documentation

## 2020-01-11 DIAGNOSIS — G510 Bell's palsy: Secondary | ICD-10-CM

## 2020-01-11 DIAGNOSIS — R202 Paresthesia of skin: Secondary | ICD-10-CM | POA: Diagnosis present

## 2020-01-11 DIAGNOSIS — E1140 Type 2 diabetes mellitus with diabetic neuropathy, unspecified: Secondary | ICD-10-CM | POA: Insufficient documentation

## 2020-01-11 LAB — GLUCOSE, CAPILLARY: Glucose-Capillary: 319 mg/dL — ABNORMAL HIGH (ref 70–99)

## 2020-01-11 MED ORDER — VALACYCLOVIR HCL 1 G PO TABS: 1000.0000 mg | ORAL_TABLET | Freq: Three times a day (TID) | ORAL | 0 refills | Status: DC

## 2020-01-11 MED ORDER — PREDNISONE 10 MG PO TABS: ORAL_TABLET | ORAL | 0 refills | Status: DC

## 2020-01-11 NOTE — ED Notes (Signed)
FSBS 319

## 2020-01-11 NOTE — ED Notes (Signed)
See triage note  Presents with numbness  States he noticed this on weds.  Unable to close left eye  Denies any other sx's  Grips equal

## 2020-01-11 NOTE — Telephone Encounter (Addendum)
Wife reports pt. Had dental work earlier this week and was fine. Yesterday started having headache. Left eye movement seemed delayed and she noticed slurred speech."He just feels bad." Instructed to go to ED now. Verbalizes understanding.  Reason for Disposition . Headache  (and neurologic deficit)  Answer Assessment - Initial Assessment Questions 1. SYMPTOM: "What is the main symptom you are concerned about?" (e.g., weakness, numbness)     Eye movement 2. ONSET: "When did this start?" (minutes, hours, days; while sleeping)     Yesterday 3. LAST NORMAL: "When was the last time you were normal (no symptoms)?"     This week 4. PATTERN "Does this come and go, or has it been constant since it started?"  "Is it present now?"     Constant 5. CARDIAC SYMPTOMS: "Have you had any of the following symptoms: chest pain, difficulty breathing, palpitations?"     No 6. NEUROLOGIC SYMPTOMS: "Have you had any of the following symptoms: headache, dizziness, vision loss, double vision, changes in speech, unsteady on your feet?"     Headache 7. OTHER SYMPTOMS: "Do you have any other symptoms?"     Slurred speech 8. PREGNANCY: "Is there any chance you are pregnant?" "When was your last menstrual period?"     n/a  Protocols used: NEUROLOGIC DEFICIT-A-AH

## 2020-01-11 NOTE — ED Provider Notes (Signed)
[facility] Emergency Department Provider Note  ____________________________________________   First MD Initiated Contact with Patient 01/11/20 0831     (approximate)  I have reviewed the triage vital signs and the nursing notes.   HISTORY  Chief Complaint Numbness   HPI Marvin Duncan. is a 64 y.o. male presents to the ED with complaint of 2 days of numbness to the left side of his face.  Patient initially noticed that he cannot completely close his eye or blink normally on the left side.  Later in the evening he noted that he was unable to eat normally on the left side and that his lips would not close which caused him to have difficulty drinking with a straw.  He denies any difficulty swallowing.  Patient denies any headache, visual changes, difficulty or numbness in his upper or lower extremities.  He rates pain as a 0/10.     Past Medical History:  Diagnosis Date  . Adrenal tumor 07/26/2018  . Decreased libido   . History of nuclear stress test    a. 05/2014: no evidence of significant ischemia, GI uptake was noted, EF 51%, no ECG changes concerning for ischemia, normal study  . HTN (hypertension)   . Hyperlipidemia   . Hypertension   . IDDM (insulin dependent diabetes mellitus)   . Obesity   . Pharyngitis   . Sleep apnea    uses CPAP    Patient Active Problem List   Diagnosis Date Noted  . Thoracic degenerative disc disease 11/29/2018  . Insomnia 11/29/2018  . Fatty liver disease, nonalcoholic 70/26/3785  . Atherosclerosis of abdominal aorta ([place]) 08/01/2018  . Adrenal tumor 07/26/2018  . Benign prostatic hyperplasia with urinary frequency 07/21/2018  . Atelectasis of left lung 06/26/2018  . Obesity (BMI 30.0-34.9) 04/17/2018  . Ventral hernia without obstruction or gangrene 05/13/2016  . GERD (gastroesophageal reflux disease) 01/04/2016  . DM type 2, uncontrolled, with neuropathy ([place]) 03/06/2015  . Erectile dysfunction due to arterial  insufficiency 10/24/2014  . Hyperlipidemia 05/23/2014  . Sleep apnea 05/23/2014  . Benign essential HTN 02/14/2007    Past Surgical History:  Procedure Laterality Date  . AV FISTULA REPAIR    . CARDIAC CATHETERIZATION     [facility]   . COLONOSCOPY  2013    Prior to Admission medications   Medication Sig Start Date End Date Taking? Authorizing Provider  amLODipine (NORVASC) 10 MG tablet Take 1 tablet (10 mg total) by mouth daily. 10/04/19   Delsa Grana, PA-C  aspirin 81 MG tablet Take 81 mg by mouth daily.    [provider]  Cholecalciferol (VITAMIN D) 2000 UNITS tablet Take 2,000 Units by mouth daily.    [provider]  Dulaglutide (TRULICITY) 1.5 YI/5.0YD SOPN Inject contents of one syringe once a week 12/19/19   Delsa Grana, PA-C  empagliflozin (JARDIANCE) 25 MG TABS tablet Take 1 tablet (25 mg total) by mouth daily. 12/17/19   Delsa Grana, PA-C  Flaxseed, Linseed, (FLAXSEED OIL) 1000 MG CAPS Take 1,000 mg by mouth daily.     [provider]  fluticasone (FLONASE) 50 MCG/ACT nasal spray USE 2 SPRAYS IN EACH  NOSTRIL DAILY (IF NEEDED  FOR ALLERGY SYMPTOMS) 10/04/19   Delsa Grana, PA-C  glipiZIDE (GLUCOTROL) 10 MG tablet TAKE 1 TABLET BY MOUTH TWICE DAILY BEFORE A MEAL FOR HIGH SUGARS (hold if blood sugar <100 or not eating) 10/04/19   Delsa Grana, PA-C  losartan (COZAAR) 100 MG tablet Take 1  tablet (100 mg total) by mouth daily. 10/04/19   Delsa Grana, PA-C  metFORMIN (GLUCOPHAGE) 1000 MG tablet Take 1 tablet (1,000 mg total) by mouth 2 (two) times daily with a meal. 10/04/19   Delsa Grana, PA-C  Omega-3 Fatty Acids (FISH OIL) 1000 MG CAPS Take 1,000 mg by mouth daily.    [provider]  predniSONE (DELTASONE) 10 MG tablet Take 4 tablets once a day for 7 days, then take 3 Tablets once a day for 7 days, 2 tablets once a day for 7 days and 1 tablet once a day for 7 days 01/11/20   Johnn Hai, PA-C  rosuvastatin (CRESTOR) 20 MG tablet Take 1 tablet (20  mg total) by mouth at bedtime. 10/04/19   Delsa Grana, PA-C  traZODone (DESYREL) 100 MG tablet Take 1 tablet (100 mg total) by mouth at bedtime as needed. for sleep 10/04/19   Delsa Grana, PA-C  valACYclovir (VALTREX) 1000 MG tablet Take 1 tablet (1,000 mg total) by mouth 3 (three) times daily. 01/11/20   Johnn Hai, PA-C  vitamin B-12 (CYANOCOBALAMIN) 1000 MCG tablet Take 1,000 mcg by mouth daily.    [provider]    Allergies Atorvastatin  Family History  Problem Relation Age of Onset  . Breast cancer Mother   . Cancer Mother   . Kidney failure Father   . Diabetes Father   . Alcohol abuse Father   . Diabetes Sister   . Other Daughter     Social History Social History   Tobacco Use  . Smoking status: Former Smoker    Packs/day: 0.25    Years: 25.00    Pack years: 6.25    Types: Cigarettes    Quit date: 05/17/1998    Years since quitting: 21.6  . Smokeless tobacco: Never Used  . Tobacco comment: off and on - maybe 5 cigarettes  Vaping Use  . Vaping Use: Never used  Substance Use Topics  . Alcohol use: No    Alcohol/week: 0.0 standard drinks    Comment: previously but not currently  . Drug use: No    Review of Systems Constitutional: No fever/chills Eyes: No visual changes.  Left eyelid weakness. ENT: No sore throat. Cardiovascular: Denies chest pain. Respiratory: Denies shortness of breath. Gastrointestinal: No abdominal pain.  No nausea, no vomiting.  Musculoskeletal: Negative for back pain or muscle aches. Skin: Negative for rash. Neurological: Negative for headaches.,  Positive for left-sided facial weakness.  No weakness in the extremities. ____________________________________________   PHYSICAL EXAM:  VITAL SIGNS: ED Triage Vitals  Enc Vitals Group     BP 01/11/20 0822 (!) 156/76     Pulse Rate 01/11/20 0822 82     Resp 01/11/20 0822 18     Temp 01/11/20 0822 98.1 F (36.7 C)     Temp Source 01/11/20 0822 Oral     SpO2 01/11/20  0822 96 %     Weight 01/11/20 0822 260 lb (117.9 kg)     Height 01/11/20 0822 6\' 2"  (1.88 m)     Head Circumference --      Peak Flow --      Pain Score 01/11/20 0819 0     Pain Loc --      Pain Edu? --      Excl. in [place]? --     Constitutional: Alert and oriented. Well appearing and in no acute distress. Eyes: Conjunctivae are normal. PERRL. EOMI. left eyelid does not close completely and left  eyebrow is not symmetrical when patient is raising the right eyebrow.  Skin is intact and no discoloration noted. Head: Atraumatic. Nose: No congestion/rhinnorhea. Mouth/Throat: Mucous membranes are moist.  Oropharynx non-erythematous.  Left side of mouth is asymmetrical when patient is smiling. Neck: No stridor.   Cardiovascular: Normal rate, regular rhythm. Grossly normal heart sounds.  Good peripheral circulation. Respiratory: Normal respiratory effort.  No retractions. Lungs CTAB. Gastrointestinal: Soft and nontender. No distention.  Musculoskeletal: Moves upper and lower extremities without any difficulty.  Normal gait was noted.  Muscle strength bilaterally. Neurologic:  Normal speech and language. No gross focal neurologic deficits are appreciated. No gait instability. Skin:  Skin is warm, dry and intact. No rash noted. Psychiatric: Mood and affect are normal. Speech and behavior are normal.  ____________________________________________   LABS (all labs ordered are listed, but only abnormal results are displayed)  Labs Reviewed  GLUCOSE, CAPILLARY - Abnormal; Notable for the following components:      Result Value   Glucose-Capillary 319 (*)    All other components within normal limits  CBG MONITORING, ED    RADIOLOGY   Official radiology report(s): CT Head Wo Contrast  Result Date: 01/11/2020 CLINICAL DATA:  Cranial neuropathy and LEFT-sided facial weakness for 2 days. EXAM: CT HEAD WITHOUT CONTRAST TECHNIQUE: Contiguous axial images were obtained from the base of the skull  through the vertex without intravenous contrast. COMPARISON:  MRI of Oct 10, 2012 FINDINGS: Brain: No evidence of acute infarction, hemorrhage, hydrocephalus, extra-axial collection or mass lesion/mass effect. Vascular: No hyperdense vessel or unexpected calcification. Skull: Normal. Negative for fracture or focal lesion. Sinuses/Orbits: Visualized paranasal sinuses and orbits which are incompletely imaged are unremarkable to the extent evaluated. Other: Chronic RIGHT mastoid effusion, present since previous MRI of 2014 IMPRESSION: 1. No acute intracranial findings. 2. Chronic RIGHT mastoid effusion. Electronically Signed   By: Zetta Bills M.D.   On: 01/11/2020 09:29    ____________________________________________   PROCEDURES  Procedure(s) performed (including Critical Care):  Procedures   ____________________________________________   INITIAL IMPRESSION / ASSESSMENT AND PLAN / ED COURSE  As part of my medical decision making, I reviewed the following data within the electronic MEDICAL RECORD NUMBER Notes from prior ED visits and Marvin Controlled Substance [place] was evaluated in Emergency Department on 01/11/2020 for the symptoms described in the history of present illness. He was evaluated in the context of the global COVID-19 pandemic, which necessitated consideration that the patient might be at risk for infection with the SARS-CoV-2 virus that causes COVID-19. Institutional protocols and algorithms that pertain to the evaluation of patients at risk for COVID-19 are in a state of rapid change based on information released by regulatory bodies including the CDC and federal and state organizations. These policies and algorithms were followed during the patient's care in the ED.  64 year old male presents to the ED with complaint of noticeable left-sided facial weakness that started 2 days ago.  Patient denies any numbness or loss of strength in the remainder of his body  including upper and lower extremities.  Patient first noticed that he was unable to blink his eye or close his eyelid.  CT scan was unchanged from patient's MRI 2014.  Patient is a type II diabetic but does not check his blood sugars at home.  This morning he has had a beverage and a peanut butter and jelly sandwich prior to arrival.  His blood sugar in the ED was 319.  Patient is  strongly encouraged to make better choices in line with his diabetes.  He is aware that the prednisone he is being placed on could increase his blood sugar.  Patient was started at a lower dose of prednisone due to his poorly controlled diabetes at this time.  He was also given a prescription for Valtrex 1 g 3 times daily for the next 7 days.  Patient is encouraged to follow-up with his primary care provider for reevaluation of his Bell's palsy and also for better control of his diabetes.  We discussed natural tears and also taping his eye closed at night to prevent drying of the eye.  Patient already wears glasses and is aware that he should be wearing them all the time to avoid foreign bodies flying into his eye especially outside.  He is to return to the emergency department if any severe worsening of his symptoms.  ____________________________________________   FINAL CLINICAL IMPRESSION(S) / ED DIAGNOSES  Final diagnoses:  Bell's palsy  Uncontrolled type 2 diabetes mellitus with hyperglycemia [facility])     ED Discharge Orders         Ordered    valACYclovir (VALTREX) 1000 MG tablet  3 times daily        01/11/20 0958    predniSONE (DELTASONE) 10 MG tablet        01/11/20 9458           Note:  This document was prepared using Dragon voice recognition software and may include unintentional dictation errors.    Johnn Hai, PA-C 01/11/20 1430    Nena Polio, MD 01/11/20 1626

## 2020-01-11 NOTE — Discharge Instructions (Signed)
Call make an appointment with your primary care provider at [facility] for a recheck of your blood sugar and also reevaluation of your Bell's palsy.  A prescription for prednisone and Valtrex was sent to the pharmacy.  Valtrex is 1 tablet 3 times a day for the next 7 days.  Also get natural tears and applied to your eye to keep your eye moist.  Use a cotton ball and tape to tape your eye closed while sleeping at night.  Wear your glasses for eye protection to avoid foreign bodies flying into your eye.  Also be aware that the prednisone that you are taking will cause increase in your blood sugar.  Stick to your diabetic diet which will help control your glucose levels while you are on the prednisone.

## 2020-01-11 NOTE — ED Triage Notes (Addendum)
Left side face feels numb since Wednesday.  Left side mouth droop.    Cant close eye right adequately and hard to eat.  No other numbness or loss of movement in body.

## 2020-02-09 ENCOUNTER — Other Ambulatory Visit: Payer: Self-pay | Admitting: Internal Medicine

## 2020-02-09 DIAGNOSIS — G4700 Insomnia, unspecified: Secondary | ICD-10-CM

## 2020-02-09 IMAGING — CT CT ABDOMEN WITHOUT CONTRAST
1 of 2 series · 14 of 32 positions shown, 19 images · non-contrast
Comparison: No prior abdominal CT.  Chest CT 07/24/2018.

CLINICAL DATA: 62-year-old male with history of possible adrenal
lesion noted on prior chest CT. Follow-up study for
characterization.

EXAM:
CT ABDOMEN WITHOUT CONTRAST
TECHNIQUE: Multidetector CT imaging of the abdomen was performed following the
standard protocol without IV contrast.

[Series 2: axial st · axial · 0.81mm/px · z∈[-824,-578]mm · 14 of 92 slices shown, 19 images]
[im 5/92  soft-tissue]
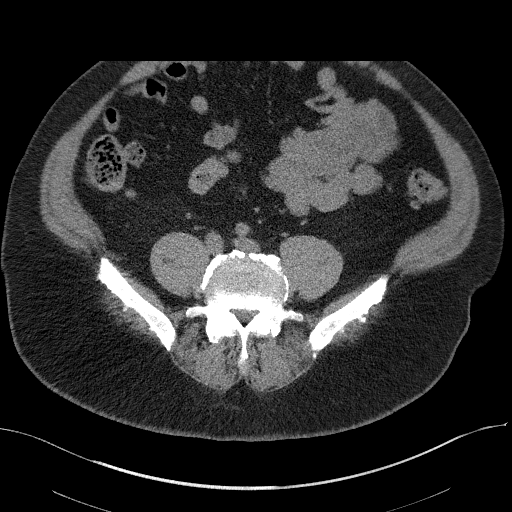
[im 5/92  bone]
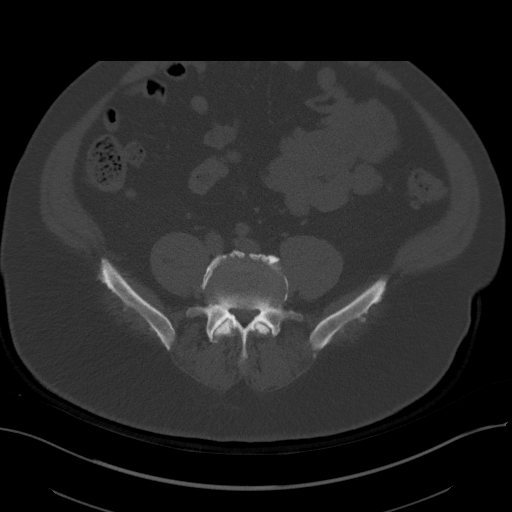
[im 14/92  soft-tissue]
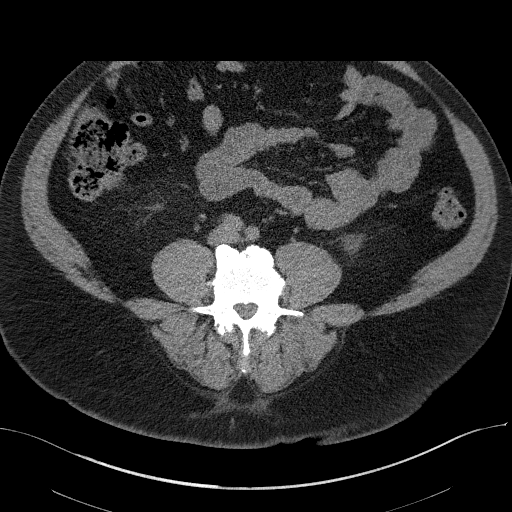
[im 18/92  soft-tissue]
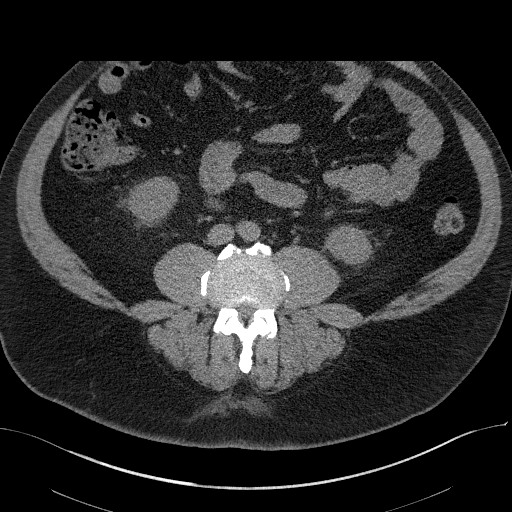
[im 27/92  soft-tissue]
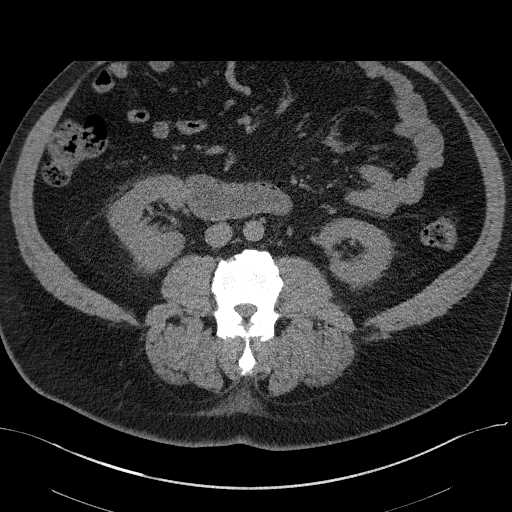
[im 31/92  soft-tissue]
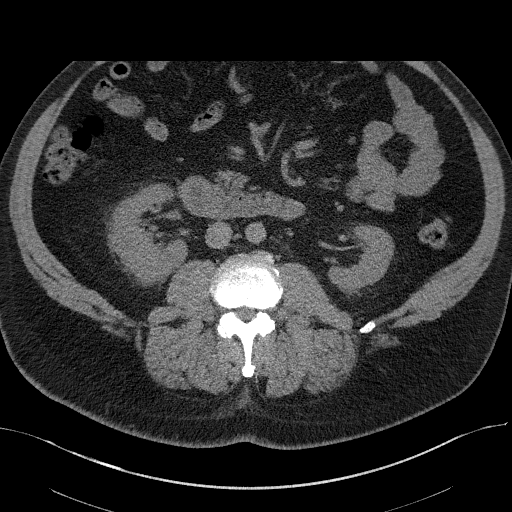
[im 40/92  soft-tissue]
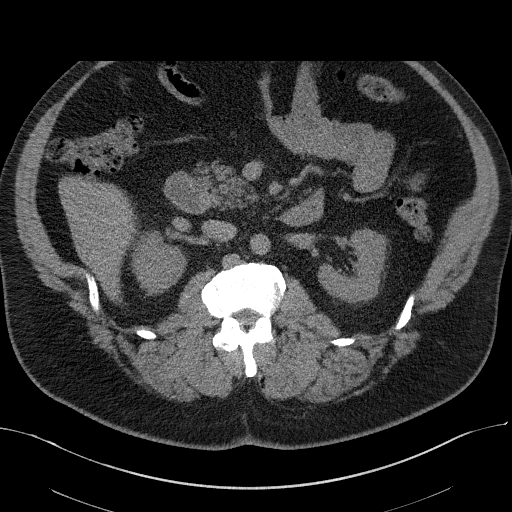
[im 48/92  soft-tissue]
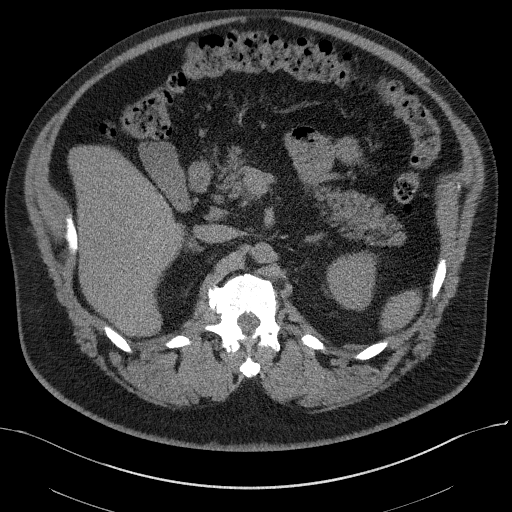
[im 53/92  soft-tissue]
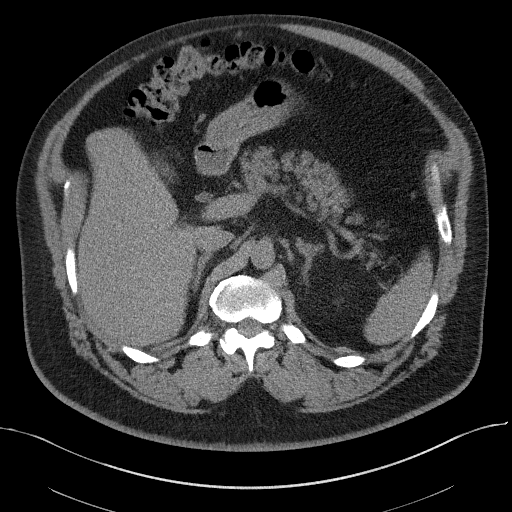
[im 61/92  soft-tissue]
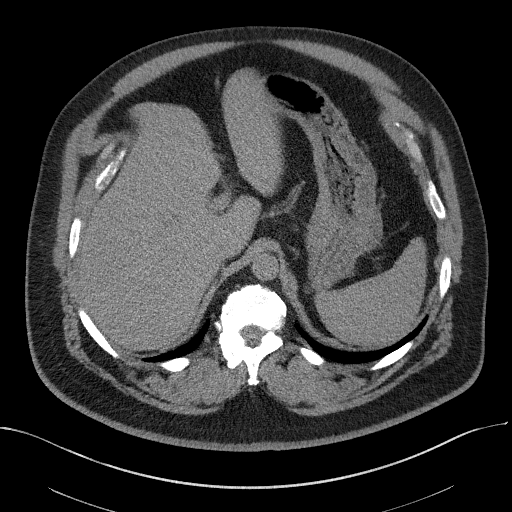
[im 61/92  bone]
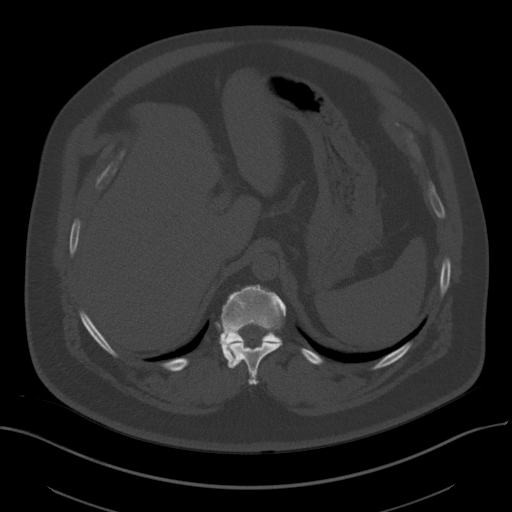
[im 66/92  soft-tissue]
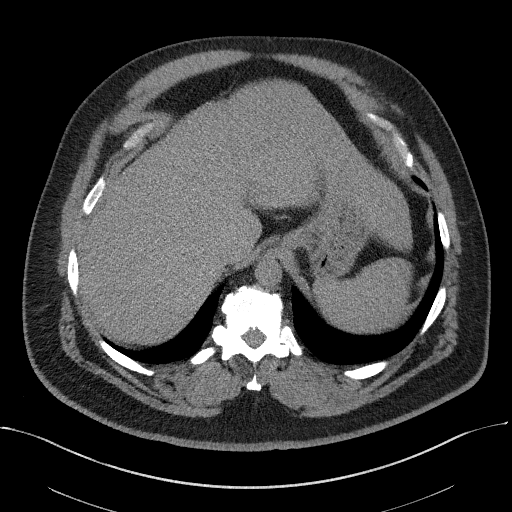
[im 74/92  soft-tissue]
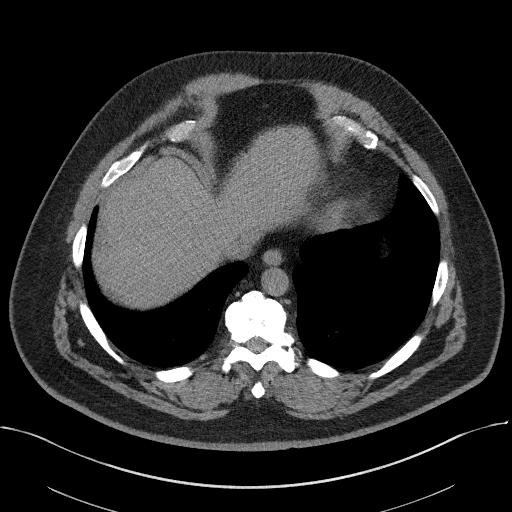
[im 74/92  lung]
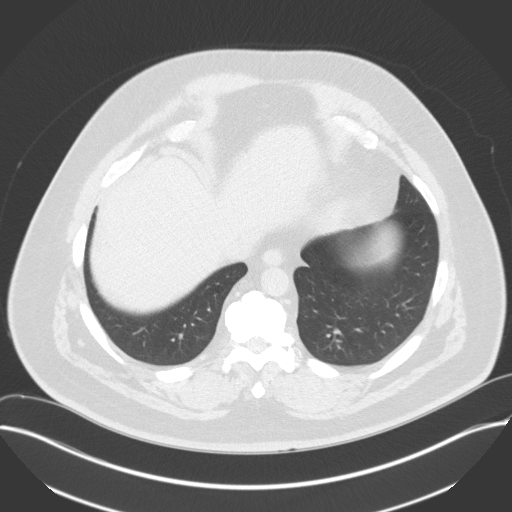
[im 79/92  soft-tissue]
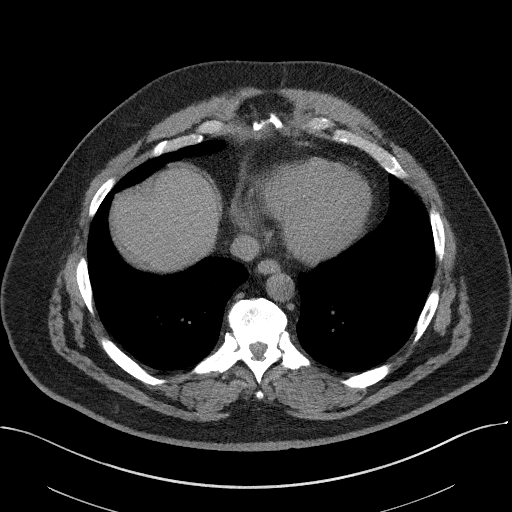
[im 79/92  lung]
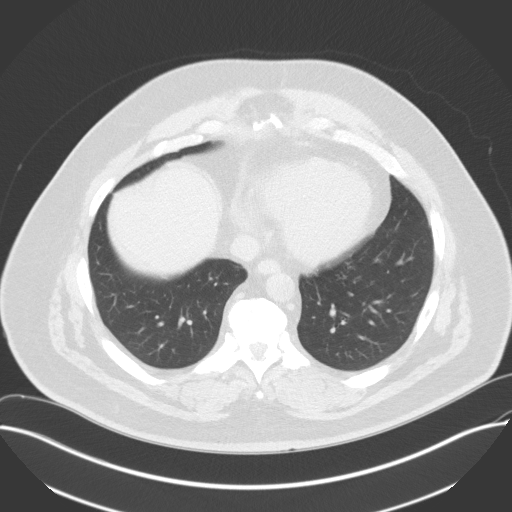
[im 83/92  lung]
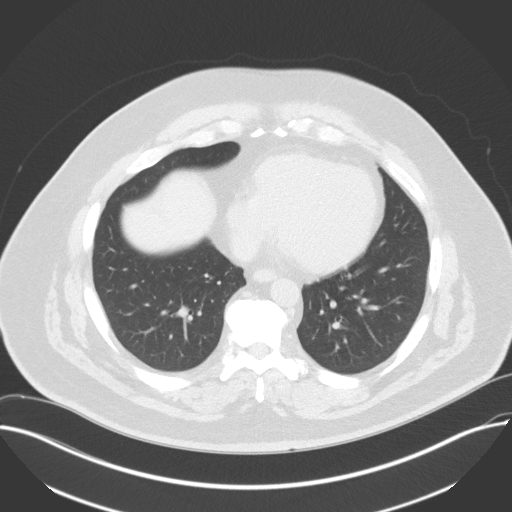
[im 87/92  soft-tissue]
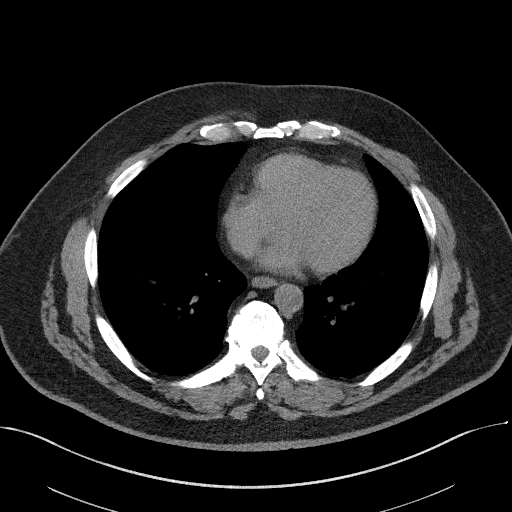
[im 87/92  lung]
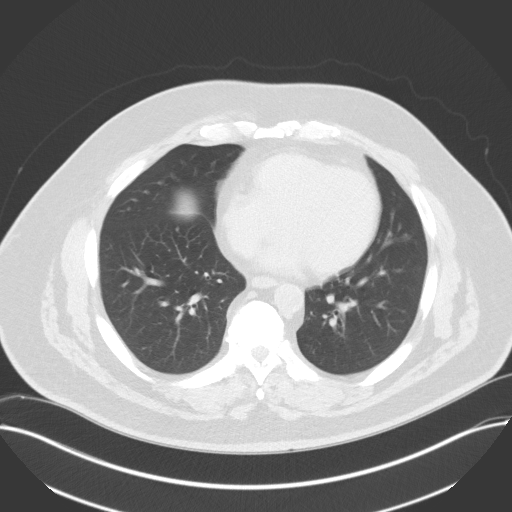

[14 of 32 positions shown; findings below may reference images not displayed]

FINDINGS: Lower chest: Unremarkable.

Hepatobiliary: Small calcified granuloma in segment 8 of the liver.
No other suspicious cystic or solid hepatic lesions confidently
identified on today's noncontrast CT examination. Unenhanced
appearance of the gallbladder is normal.

Pancreas: No definite pancreatic mass or peripancreatic fluid or
inflammatory changes are noted on today's noncontrast CT
examination.

Spleen: Unremarkable.

Adrenals/Urinary Tract: Bilateral kidneys and adrenal glands are
normal in appearance. No hydroureteronephrosis.

Stomach/Bowel: Small diverticulum extending from the proximal
stomach which simulated and a left adrenal lesion on the prior CT
examination. No pathologic dilatation of visualized portions of
small bowel or colon. Normal appendix.

Vascular/Lymphatic: Atherosclerotic calcifications in the internal
iliac arteries bilaterally. No lymphadenopathy noted in the abdomen.

Other: No significant volume of ascites and no pneumoperitoneum
noted in the visualized portions of the peritoneal cavity.

Musculoskeletal: There are no aggressive appearing lytic or blastic
lesions noted in the visualized portions of the skeleton.
IMPRESSION: 1. No adrenal lesion.
2. The finding on the prior CT examination corresponds to a small
gastric diverticulum. This is a benign finding.
3. Atherosclerosis.

## 2020-04-04 ENCOUNTER — Other Ambulatory Visit: Payer: Self-pay

## 2020-04-04 ENCOUNTER — Encounter: Payer: Self-pay | Admitting: Family Medicine

## 2020-04-04 ENCOUNTER — Ambulatory Visit (INDEPENDENT_AMBULATORY_CARE_PROVIDER_SITE_OTHER): Payer: No Typology Code available for payment source | Admitting: Family Medicine

## 2020-04-04 VITALS — BP 132/82 | HR 71 | Temp 97.5°F | Resp 16 | Ht 74.0 in | Wt 258.2 lb

## 2020-04-04 DIAGNOSIS — E1165 Type 2 diabetes mellitus with hyperglycemia: Secondary | ICD-10-CM | POA: Diagnosis not present

## 2020-04-04 DIAGNOSIS — Z5181 Encounter for therapeutic drug level monitoring: Secondary | ICD-10-CM

## 2020-04-04 DIAGNOSIS — E782 Mixed hyperlipidemia: Secondary | ICD-10-CM

## 2020-04-04 DIAGNOSIS — I10 Essential (primary) hypertension: Secondary | ICD-10-CM

## 2020-04-04 LAB — POCT GLYCOSYLATED HEMOGLOBIN (HGB A1C): Hemoglobin A1C: 6.3 % — AB (ref 4.0–5.6)

## 2020-04-04 NOTE — Progress Notes (Signed)
Name: Marvin MONTZ Sr.   MRN: [id number]    DOB: December 12, 1955   Date:04/04/2020       Progress Note  Chief Complaint  Patient presents with  . Hypertension  . Diabetes     Subjective:   Marvin Duncan. is a 64 y.o. male, presents to clinic for routine follow-up   DM - managed with glipizide, trulicity, jardiance and metformin, compliant with meds, no highs or lows, DM has been well controlled, was 6.8 in Jan 2021, increased to 8.5 - advised to f/up back in office to discuss labs and change management, he has not returned until today Denies: Polyuria, polydipsia, vision changes, neuropathy, hypoglycemia Recent pertinent labs: Lab Results  Component Value Date   HGBA1C 8.5 (H) 10/04/2019   HGBA1C 6.8 (H) 06/06/2019   Standard of care and health maintenance: Foot exam: Due today DM eye exam: overdue - previously referred ACEI/ARB: Losartan Statin: Crestor   HTN-  Well controlled today, pt managed with norvasc 10 mg, losartan 100 mg  HLD- on crestor 20 mg and omega-3 fatty acid supplement.  Pt notes good daily med compliance, denies myalgias, CP, SOB, claudication sx.  Insomnia managed on trazodone 100 mg daily  Obesity: Patient has lost some weight since earlier this year about 13 pounds Wt Readings from Last 5 Encounters:  04/04/20 258 lb 3.2 oz (117.1 kg)  01/11/20 260 lb (117.9 kg)  10/04/19 263 lb (119.3 kg)  06/06/19 271 lb 1.6 oz (123 kg)  03/01/19 263 lb 14.4 oz (119.7 kg)   BMI Readings from Last 5 Encounters:  04/04/20 33.15 kg/m  01/11/20 33.38 kg/m  10/04/19 33.77 kg/m  06/06/19 34.81 kg/m  03/01/19 33.88 kg/m      Current Outpatient Medications:  .  amLODipine (NORVASC) 10 MG tablet, Take 1 tablet (10 mg total) by mouth daily., Disp: 90 tablet, Rfl: 3 .  aspirin 81 MG tablet, Take 81 mg by mouth daily., Disp: , Rfl:  .  Cholecalciferol (VITAMIN D) 2000 UNITS tablet, Take 2,000 Units by mouth daily., Disp: , Rfl:  .  Dulaglutide  (TRULICITY) 1.5 YD/7.4JO SOPN, Inject contents of one syringe once a week, Disp: 12 pen, Rfl: 1 .  empagliflozin (JARDIANCE) 25 MG TABS tablet, Take 1 tablet (25 mg total) by mouth daily., Disp: 90 tablet, Rfl: 1 .  Flaxseed, Linseed, (FLAXSEED OIL) 1000 MG CAPS, Take 1,000 mg by mouth daily. , Disp: , Rfl:  .  fluticasone (FLONASE) 50 MCG/ACT nasal spray, USE 2 SPRAYS IN EACH  NOSTRIL DAILY (IF NEEDED  FOR ALLERGY SYMPTOMS), Disp: 48 g, Rfl: 3 .  glipiZIDE (GLUCOTROL) 10 MG tablet, TAKE 1 TABLET BY MOUTH TWICE DAILY BEFORE A MEAL FOR HIGH SUGARS (hold if blood sugar <100 or not eating), Disp: 180 tablet, Rfl: 3 .  losartan (COZAAR) 100 MG tablet, Take 1 tablet (100 mg total) by mouth daily., Disp: 90 tablet, Rfl: 3 .  metFORMIN (GLUCOPHAGE) 1000 MG tablet, Take 1 tablet (1,000 mg total) by mouth 2 (two) times daily with a meal., Disp: 180 tablet, Rfl: 3 .  Omega-3 Fatty Acids (FISH OIL) 1000 MG CAPS, Take 1,000 mg by mouth daily., Disp: , Rfl:  .  predniSONE (DELTASONE) 10 MG tablet, Take 4 tablets once a day for 7 days, then take 3 Tablets once a day for 7 days, 2 tablets once a day for 7 days and 1 tablet once a day for 7 days, Disp: 70 tablet, Rfl: 0 .  rosuvastatin (  CRESTOR) 20 MG tablet, Take 1 tablet (20 mg total) by mouth at bedtime., Disp: 90 tablet, Rfl: 3 .  traZODone (DESYREL) 100 MG tablet, TAKE 1 TABLET(100 MG) BY MOUTH AT BEDTIME AS NEEDED FOR SLEEP, Disp: 90 tablet, Rfl: 3 .  valACYclovir (VALTREX) 1000 MG tablet, Take 1 tablet (1,000 mg total) by mouth 3 (three) times daily., Disp: 21 tablet, Rfl: 0 .  vitamin B-12 (CYANOCOBALAMIN) 1000 MCG tablet, Take 1,000 mcg by mouth daily., Disp: , Rfl:   Patient Active Problem List   Diagnosis Date Noted  . Uncontrolled type 2 diabetes mellitus with hyperglycemia ([place]) 04/04/2020  . Thoracic degenerative disc disease 11/29/2018  . Insomnia 11/29/2018  . Fatty liver disease, nonalcoholic 32/67/1245  . Atherosclerosis of abdominal aorta  ([place]) 08/01/2018  . Adrenal tumor 07/26/2018  . Benign prostatic hyperplasia with urinary frequency 07/21/2018  . Atelectasis of left lung 06/26/2018  . Obesity (BMI 30.0-34.9) 04/17/2018  . Ventral hernia without obstruction or gangrene 05/13/2016  . GERD (gastroesophageal reflux disease) 01/04/2016  . DM type 2, uncontrolled, with neuropathy ([place]) 03/06/2015  . Erectile dysfunction due to arterial insufficiency 10/24/2014  . Hyperlipidemia 05/23/2014  . Sleep apnea 05/23/2014  . Benign essential HTN 02/14/2007    Past Surgical History:  Procedure Laterality Date  . AV FISTULA REPAIR    . CARDIAC CATHETERIZATION     [facility]   . COLONOSCOPY  2013    Family History  Problem Relation Age of Onset  . Breast cancer Mother   . Cancer Mother   . Kidney failure Father   . Diabetes Father   . Alcohol abuse Father   . Diabetes Sister   . Other Daughter     Social History   Tobacco Use  . Smoking status: Former Smoker    Packs/day: 0.25    Years: 25.00    Pack years: 6.25    Types: Cigarettes    Quit date: 05/17/1998    Years since quitting: 21.8  . Smokeless tobacco: Never Used  . Tobacco comment: off and on - maybe 5 cigarettes  Vaping Use  . Vaping Use: Never used  Substance Use Topics  . Alcohol use: No    Alcohol/week: 0.0 standard drinks    Comment: previously but not currently  . Drug use: No     Allergies  Allergen Reactions  . Atorvastatin Other (See Comments)    Statins cause headaches and muscle aches    Health Maintenance  Topic Date Due  . OPHTHALMOLOGY EXAM  10/29/2018  . COVID-19 Vaccine (1) 04/20/2020 (Originally 01/10/1968)  . INFLUENZA VACCINE  08/14/2020 (Originally 12/16/2019)  . COLONOSCOPY  10/03/2020 (Originally 05/18/2019)  . FOOT EXAM  06/05/2020  . HEMOGLOBIN A1C  10/02/2020  . TETANUS/TDAP  09/25/2025  . PNEUMOCOCCAL POLYSACCHARIDE VACCINE AGE 23-64 HIGH RISK  Completed  . Hepatitis C Screening  Completed  . HIV Screening  Completed     Chart Review Today: I personally reviewed active problem list, medication list, allergies, family history, social history, health maintenance, notes from last encounter, lab results, imaging with the patient/caregiver today.   Review of Systems  10 Systems reviewed and are negative for acute change except as noted in the HPI.  Objective:   Vitals:   04/04/20 0927  BP: 132/82  Pulse: 71  Resp: 16  Temp: (!) 97.5 F (36.4 C)  TempSrc: Oral  SpO2: 98%  Weight: 258 lb 3.2 oz (117.1 kg)  Height: 6\' 2"  (1.88 m)    Body  mass index is 33.15 kg/m.  Physical Exam Vitals and nursing note reviewed.  Constitutional:      General: He is not in acute distress.    Appearance: Normal appearance. He is well-developed. He is obese. He is not ill-appearing, toxic-appearing or diaphoretic.     Interventions: Face mask in place.  HENT:     Head: Normocephalic and atraumatic.     Jaw: No trismus.     Right Ear: External ear normal.     Left Ear: External ear normal.  Eyes:     General: Lids are normal. No scleral icterus.       Right eye: No discharge.        Left eye: No discharge.     Conjunctiva/sclera: Conjunctivae normal.  Neck:     Trachea: Trachea and phonation normal. No tracheal deviation.  Cardiovascular:     Rate and Rhythm: Normal rate and regular rhythm.     Pulses: Normal pulses.          Radial pulses are 2+ on the right side and 2+ on the left side.       Posterior tibial pulses are 2+ on the right side and 2+ on the left side.     Heart sounds: Normal heart sounds. No murmur heard. No friction rub. No gallop.   Pulmonary:     Effort: Pulmonary effort is normal. No respiratory distress.     Breath sounds: Normal breath sounds. No stridor. No wheezing, rhonchi or rales.  Abdominal:     General: Bowel sounds are normal. There is no distension.     Palpations: Abdomen is soft.  Musculoskeletal:     Right lower leg: No edema.     Left lower leg: No edema.   Skin:    General: Skin is warm and dry.     Coloration: Skin is not jaundiced.     Findings: No rash.     Nails: There is no clubbing.  Neurological:     Mental Status: He is alert. Mental status is at baseline.     Cranial Nerves: No dysarthria or facial asymmetry.     Motor: No tremor or abnormal muscle tone.     Gait: Gait normal.  Psychiatric:        Mood and Affect: Mood normal.        Speech: Speech normal.        Behavior: Behavior normal. Behavior is cooperative.       Diabetic Foot Exam - Simple   Simple Foot Form Diabetic Foot exam was performed with the following findings: Yes 04/04/2020  9:20 AM  Visual Inspection Sensation Testing Pulse Check Comments      Assessment & Plan:   1. Uncontrolled type 2 diabetes mellitus with hyperglycemia ([place]) Patient reports he has been compliant with medications including Metformin 1000 mg twice daily, glipizide 10 mg holding if any low blood sugars, Jardiance and Trulicity he has been working on diet and lifestyle efforts Point-of-care A1c was improved today Lab Results  Component Value Date   HGBA1C 6.3 (A) 04/04/2020  Foot exam was done He is on a ARB We will recheck his labs monitoring renal function He is compliant with statin Needs eye exam done - POCT glycosylated hemoglobin (Hb A1C) - Lipid panel - Comprehensive metabolic panel  2. Benign essential HTN Stable, well controlled, blood pressure at goal today, continue losartan 100 mg daily and Norvasc 10 mg daily continue other dietary approaches to monitoring and managing  hypertension encouraged continued lifestyle efforts and weight loss - Comprehensive metabolic panel  3. Mixed hyperlipidemia Good med compliance reported today, no myalgias side effects or concerns, recheck labs - Lipid panel - Comprehensive metabolic panel  4. Encounter for medication monitoring - POCT glycosylated hemoglobin (Hb A1C) - Lipid panel - CBC with Differential/Platelet -  Comprehensive metabolic panel   Return in about 6 months (around 10/02/2020) for Routine follow-up, Annual Physical.   Delsa Grana, PA-C 04/04/20 9:52 AM

## 2020-04-05 LAB — CBC WITH DIFFERENTIAL/PLATELET
Basophils Absolute: 0.1 10*3/uL (ref 0.0–0.2)
Basos: 1 %
EOS (ABSOLUTE): 0.1 10*3/uL (ref 0.0–0.4)
Eos: 1 %
Hematocrit: 41.1 % (ref 37.5–51.0)
Hemoglobin: 13.7 g/dL (ref 13.0–17.7)
Immature Grans (Abs): 0 10*3/uL (ref 0.0–0.1)
Immature Granulocytes: 0 %
Lymphocytes Absolute: 2.3 10*3/uL (ref 0.7–3.1)
Lymphs: 36 %
MCH: 29 pg (ref 26.6–33.0)
MCHC: 33.3 g/dL (ref 31.5–35.7)
MCV: 87 fL (ref 79–97)
Monocytes Absolute: 0.5 10*3/uL (ref 0.1–0.9)
Monocytes: 8 %
Neutrophils Absolute: 3.4 10*3/uL (ref 1.4–7.0)
Neutrophils: 54 %
Platelets: 247 10*3/uL (ref 150–450)
RBC: 4.73 x10E6/uL (ref 4.14–5.80)
RDW: 11.4 % — ABNORMAL LOW (ref 11.6–15.4)
WBC: 6.3 10*3/uL (ref 3.4–10.8)

## 2020-04-05 LAB — COMPREHENSIVE METABOLIC PANEL
ALT: 11 IU/L (ref 0–44)
AST: 18 IU/L (ref 0–40)
Albumin/Globulin Ratio: 1.6 (ref 1.2–2.2)
Albumin: 4.4 g/dL (ref 3.8–4.8)
Alkaline Phosphatase: 83 IU/L (ref 44–121)
BUN/Creatinine Ratio: 12 (ref 10–24)
BUN: 14 mg/dL (ref 8–27)
Bilirubin Total: 0.5 mg/dL (ref 0.0–1.2)
CO2: 22 mmol/L (ref 20–29)
Calcium: 9.6 mg/dL (ref 8.6–10.2)
Chloride: 101 mmol/L (ref 96–106)
Creatinine, Ser: 1.14 mg/dL (ref 0.76–1.27)
GFR calc Af Amer: 79 mL/min/{1.73_m2} (ref >59)
GFR calc non Af Amer: 68 mL/min/{1.73_m2} (ref >59)
Globulin, Total: 2.7 g/dL (ref 1.5–4.5)
Glucose: 76 mg/dL (ref 65–99)
Potassium: 4.4 mmol/L (ref 3.5–5.2)
Sodium: 138 mmol/L (ref 134–144)
Total Protein: 7.1 g/dL (ref 6.0–8.5)

## 2020-04-05 LAB — LIPID PANEL
Chol/HDL Ratio: 2.7 ratio (ref 0.0–5.0)
Cholesterol, Total: 116 mg/dL (ref 100–199)
HDL: 43 mg/dL (ref >39)
LDL Chol Calc (NIH): 58 mg/dL (ref 0–99)
Triglycerides: 73 mg/dL (ref 0–149)
VLDL Cholesterol Cal: 15 mg/dL (ref 5–40)

## 2020-05-26 ENCOUNTER — Other Ambulatory Visit: Payer: Self-pay

## 2020-05-26 ENCOUNTER — Other Ambulatory Visit: Payer: No Typology Code available for payment source

## 2020-05-26 DIAGNOSIS — Z20822 Contact with and (suspected) exposure to covid-19: Secondary | ICD-10-CM

## 2020-05-30 LAB — NOVEL CORONAVIRUS, NAA: SARS-CoV-2, NAA: DETECTED — AB

## 2020-06-10 ENCOUNTER — Other Ambulatory Visit: Payer: Self-pay

## 2020-06-10 ENCOUNTER — Telehealth: Payer: Self-pay

## 2020-06-10 DIAGNOSIS — IMO0002 Reserved for concepts with insufficient information to code with codable children: Secondary | ICD-10-CM

## 2020-06-10 DIAGNOSIS — E1140 Type 2 diabetes mellitus with diabetic neuropathy, unspecified: Secondary | ICD-10-CM

## 2020-06-10 DIAGNOSIS — K760 Fatty (change of) liver, not elsewhere classified: Secondary | ICD-10-CM

## 2020-06-10 MED ORDER — GLIPIZIDE 10 MG PO TABS: ORAL_TABLET | ORAL | 0 refills | Status: DC

## 2020-06-10 MED ORDER — EMPAGLIFLOZIN 25 MG PO TABS
25.0000 mg | ORAL_TABLET | Freq: Every day | ORAL | 0 refills | Status: DC
Start: 2020-06-10 — End: 2020-09-03

## 2020-06-10 MED ORDER — TRULICITY 1.5 MG/0.5ML ~~LOC~~ SOAJ: SUBCUTANEOUS | 3 refills | Status: DC

## 2020-06-10 NOTE — Telephone Encounter (Signed)
Pt is scheduled for may

## 2020-06-10 NOTE — Telephone Encounter (Signed)
Pt needs f/u for DM med refills

## 2020-09-03 ENCOUNTER — Other Ambulatory Visit: Payer: Self-pay | Admitting: Family Medicine

## 2020-09-03 ENCOUNTER — Other Ambulatory Visit: Payer: Self-pay

## 2020-09-03 DIAGNOSIS — E1140 Type 2 diabetes mellitus with diabetic neuropathy, unspecified: Secondary | ICD-10-CM

## 2020-09-03 DIAGNOSIS — IMO0002 Reserved for concepts with insufficient information to code with codable children: Secondary | ICD-10-CM

## 2020-09-03 DIAGNOSIS — E1165 Type 2 diabetes mellitus with hyperglycemia: Secondary | ICD-10-CM

## 2020-09-03 MED ORDER — GLIPIZIDE 10 MG PO TABS: ORAL_TABLET | ORAL | 0 refills | Status: DC

## 2020-09-03 NOTE — Telephone Encounter (Signed)
Last seen:11/19 Next:10/06/20

## 2020-09-04 LAB — HM DIABETES EYE EXAM

## 2020-10-05 ENCOUNTER — Other Ambulatory Visit: Payer: Self-pay | Admitting: Family Medicine

## 2020-10-05 DIAGNOSIS — IMO0002 Reserved for concepts with insufficient information to code with codable children: Secondary | ICD-10-CM

## 2020-10-05 DIAGNOSIS — E1140 Type 2 diabetes mellitus with diabetic neuropathy, unspecified: Secondary | ICD-10-CM

## 2020-10-05 DIAGNOSIS — E782 Mixed hyperlipidemia: Secondary | ICD-10-CM

## 2020-10-05 DIAGNOSIS — I700 Atherosclerosis of aorta: Secondary | ICD-10-CM

## 2020-10-06 ENCOUNTER — Encounter: Payer: No Typology Code available for payment source | Admitting: Family Medicine

## 2020-10-07 ENCOUNTER — Other Ambulatory Visit: Payer: Self-pay

## 2020-10-07 DIAGNOSIS — I700 Atherosclerosis of aorta: Secondary | ICD-10-CM

## 2020-10-07 DIAGNOSIS — I10 Essential (primary) hypertension: Secondary | ICD-10-CM

## 2020-10-07 MED ORDER — AMLODIPINE BESYLATE 10 MG PO TABS: 10.0000 mg | ORAL_TABLET | Freq: Every day | ORAL | 3 refills | Status: DC

## 2020-10-16 ENCOUNTER — Ambulatory Visit (INDEPENDENT_AMBULATORY_CARE_PROVIDER_SITE_OTHER): Payer: No Typology Code available for payment source | Admitting: Unknown Physician Specialty

## 2020-10-16 ENCOUNTER — Encounter: Payer: Self-pay | Admitting: Unknown Physician Specialty

## 2020-10-16 ENCOUNTER — Other Ambulatory Visit: Payer: Self-pay

## 2020-10-16 VITALS — BP 138/82 | HR 77 | Temp 98.2°F | Resp 18 | Ht 74.0 in | Wt 258.3 lb

## 2020-10-16 DIAGNOSIS — E782 Mixed hyperlipidemia: Secondary | ICD-10-CM

## 2020-10-16 DIAGNOSIS — J309 Allergic rhinitis, unspecified: Secondary | ICD-10-CM

## 2020-10-16 DIAGNOSIS — I10 Essential (primary) hypertension: Secondary | ICD-10-CM | POA: Diagnosis not present

## 2020-10-16 DIAGNOSIS — Z1211 Encounter for screening for malignant neoplasm of colon: Secondary | ICD-10-CM

## 2020-10-16 DIAGNOSIS — D497 Neoplasm of unspecified behavior of endocrine glands and other parts of nervous system: Secondary | ICD-10-CM

## 2020-10-16 DIAGNOSIS — E669 Obesity, unspecified: Secondary | ICD-10-CM

## 2020-10-16 DIAGNOSIS — Z0000 Encounter for general adult medical examination without abnormal findings: Secondary | ICD-10-CM | POA: Diagnosis not present

## 2020-10-16 DIAGNOSIS — K760 Fatty (change of) liver, not elsewhere classified: Secondary | ICD-10-CM

## 2020-10-16 DIAGNOSIS — E1140 Type 2 diabetes mellitus with diabetic neuropathy, unspecified: Secondary | ICD-10-CM | POA: Diagnosis not present

## 2020-10-16 DIAGNOSIS — G4700 Insomnia, unspecified: Secondary | ICD-10-CM

## 2020-10-16 DIAGNOSIS — G4739 Other sleep apnea: Secondary | ICD-10-CM

## 2020-10-16 DIAGNOSIS — I700 Atherosclerosis of aorta: Secondary | ICD-10-CM | POA: Diagnosis not present

## 2020-10-16 DIAGNOSIS — E1142 Type 2 diabetes mellitus with diabetic polyneuropathy: Secondary | ICD-10-CM

## 2020-10-16 DIAGNOSIS — IMO0002 Reserved for concepts with insufficient information to code with codable children: Secondary | ICD-10-CM

## 2020-10-16 DIAGNOSIS — E1165 Type 2 diabetes mellitus with hyperglycemia: Secondary | ICD-10-CM

## 2020-10-16 MED ORDER — GLIPIZIDE 10 MG PO TABS: ORAL_TABLET | ORAL | 0 refills | Status: DC

## 2020-10-16 MED ORDER — AMLODIPINE BESYLATE 10 MG PO TABS: 10.0000 mg | ORAL_TABLET | Freq: Every day | ORAL | 3 refills | Status: DC

## 2020-10-16 MED ORDER — METFORMIN HCL 1000 MG PO TABS: 1000.0000 mg | ORAL_TABLET | Freq: Two times a day (BID) | ORAL | 3 refills | Status: DC

## 2020-10-16 MED ORDER — FLUTICASONE PROPIONATE 50 MCG/ACT NA SUSP: NASAL | 3 refills | Status: DC

## 2020-10-16 MED ORDER — TRULICITY 1.5 MG/0.5ML ~~LOC~~ SOAJ: SUBCUTANEOUS | 3 refills | Status: DC

## 2020-10-16 MED ORDER — EMPAGLIFLOZIN 25 MG PO TABS
25.0000 mg | ORAL_TABLET | Freq: Every day | ORAL | 1 refills | Status: DC
Start: 2020-10-16 — End: 2021-09-10

## 2020-10-16 MED ORDER — TRAZODONE HCL 100 MG PO TABS: 100.0000 mg | ORAL_TABLET | Freq: Every day | ORAL | 3 refills | Status: DC

## 2020-10-16 MED ORDER — LOSARTAN POTASSIUM 100 MG PO TABS: 100.0000 mg | ORAL_TABLET | Freq: Every day | ORAL | 3 refills | Status: DC

## 2020-10-16 MED ORDER — ROSUVASTATIN CALCIUM 20 MG PO TABS: 20.0000 mg | ORAL_TABLET | Freq: Every day | ORAL | 3 refills | Status: DC

## 2020-10-16 NOTE — Assessment & Plan Note (Signed)
Wants a new machine.  Refer back to sleep study at [place] as diagnosed quite some time ago

## 2020-10-16 NOTE — Assessment & Plan Note (Signed)
Chart review shows MRI shows no adrenal tumor and f/u not needed

## 2020-10-16 NOTE — Assessment & Plan Note (Signed)
A little high today.  Encouraged to monitor at home

## 2020-10-16 NOTE — Patient Instructions (Signed)

## 2020-10-16 NOTE — Assessment & Plan Note (Signed)
Stable, continue present medications.   

## 2020-10-16 NOTE — Assessment & Plan Note (Signed)
Check Hgb A1C.  Foot exam shows stable neuropathy

## 2020-10-16 NOTE — Assessment & Plan Note (Signed)
On Crestor.  Check lipids today

## 2020-10-16 NOTE — Assessment & Plan Note (Signed)
Continue to work on diet and exercise

## 2020-10-16 NOTE — Assessment & Plan Note (Signed)
Check labs today.

## 2020-10-16 NOTE — Progress Notes (Signed)
BP 138/82   Pulse 77   Temp 98.2 F (36.8 C) (Oral)   Resp 18   Ht 6\' 2"  (1.88 m)   Wt 258 lb 4.8 oz (117.2 kg)   SpO2 98%   BMI 33.16 kg/m    Subjective:    Patient ID: Marvin Duncan., male    DOB: 03/02/1956, 65 y.o.   MRN: [id number]  HPI: Marvin Duncan. is a 65 y.o. male  Chief Complaint  Patient presents with  . Annual Exam   Diabetes: Using medications without difficulties No hypoglycemic episodes No hyperglycemic episodes Feet problems: Has some numbness and tingling.  Hasn't changed much Blood Sugars averaging: not checking eye exam within last year: 2 months ago Last Hgb A1C: 6.3%  Hypertension  Using medications without difficulty Average home BPs   Using medication without problems or lightheadedness No chest pain with exertion or shortness of breath No Edema  Elevated Cholesterol Using medications without problems No Muscle aches  Diet: Exercise: Diet is good and walks at work  Sleep apnea Does not use CPAP and feels like he needs another machine   Depression screen [facility] 2/9 10/16/2020 04/04/2020 10/04/2019 06/06/2019 03/01/2019  Decreased Interest 0 0 0 0 0  Down, Depressed, Hopeless 0 0 0 0 0  PHQ - 2 Score 0 0 0 0 0  Altered sleeping 1 - 0 0 2  Tired, decreased energy 0 - 0 1 0  Change in appetite 0 - 0 0 0  Feeling bad or failure about yourself  0 - 0 0 0  Trouble concentrating 0 - 0 0 0  Moving slowly or fidgety/restless 0 - 0 0 0  Suicidal thoughts 0 - 0 0 0  PHQ-9 Score 1 - 0 1 2  Difficult doing work/chores Not difficult at all - Not difficult at all Not difficult at all Not difficult at all  Some recent data might be hidden   Social History   Socioeconomic History  . Marital status: Married    Spouse name: Marvin Duncan  . Number of children: 3  . Years of education: Not on file  . Highest education level: Some college, no degree  Occupational History  . Not on file  Tobacco Use  . Smoking status: Former Smoker    Packs/day:  0.25    Years: 25.00    Pack years: 6.25    Types: Cigarettes    Quit date: 05/17/1998    Years since quitting: 22.4  . Smokeless tobacco: Never Used  . Tobacco comment: off and on - maybe 5 cigarettes  Vaping Use  . Vaping Use: Never used  Substance and Sexual Activity  . Alcohol use: No    Alcohol/week: 0.0 standard drinks    Comment: previously but not currently  . Drug use: No  . Sexual activity: Yes    Partners: Female  Other Topics Concern  . Not on file  Social History Narrative  . Not on file   Social Determinants of Health   Financial Resource Strain: Low Risk   . Difficulty of Paying Living Expenses: Not hard at all  Food Insecurity: No Food Insecurity  . Worried About Charity fundraiser in the Last Year: Never true  . Ran Out of Food in the Last Year: Never true  Transportation Needs: No Transportation Needs  . Lack of Transportation (Medical): No  . Lack of Transportation (Non-Medical): No  Physical Activity: Insufficiently Active  . Days of Exercise per  Week: 2 days  . Minutes of Exercise per Session: 30 min  Stress: No Stress Concern Present  . Feeling of Stress : Only a little  Social Connections: Socially Integrated  . Frequency of Communication with Friends and Family: Twice a week  . Frequency of Social Gatherings with Friends and Family: Twice a week  . Attends Religious Services: More than 4 times per year  . Active Member of Clubs or Organizations: Yes  . Attends Archivist Meetings: More than 4 times per year  . Marital Status: Married  Human resources officer Violence: Not At Risk  . Fear of Current or Ex-Partner: No  . Emotionally Abused: No  . Physically Abused: No  . Sexually Abused: No   Family History  Problem Relation Age of Onset  . Breast cancer Mother   . Cancer Mother   . Kidney failure Father   . Diabetes Father   . Alcohol abuse Father   . Diabetes Sister   . Other Daughter    Past Medical History:  Diagnosis Date  .  Adrenal tumor 07/26/2018  . Decreased libido   . History of nuclear stress test    a. 05/2014: no evidence of significant ischemia, GI uptake was noted, EF 51%, no ECG changes concerning for ischemia, normal study  . HTN (hypertension)   . Hyperlipidemia   . Hypertension   . IDDM (insulin dependent diabetes mellitus)   . Obesity   . Pharyngitis   . Sleep apnea    uses CPAP     Relevant past medical, surgical, family and social history reviewed and updated as indicated. Interim medical history since our last visit reviewed. Allergies and medications reviewed and updated.  Review of Systems  All other systems reviewed and are negative.   Per HPI unless specifically indicated above     Objective:    BP 138/82   Pulse 77   Temp 98.2 F (36.8 C) (Oral)   Resp 18   Ht 6\' 2"  (1.88 m)   Wt 258 lb 4.8 oz (117.2 kg)   SpO2 98%   BMI 33.16 kg/m   Wt Readings from Last 3 Encounters:  10/16/20 258 lb 4.8 oz (117.2 kg)  04/04/20 258 lb 3.2 oz (117.1 kg)  01/11/20 260 lb (117.9 kg)    Physical Exam Constitutional:      Appearance: He is well-developed.  HENT:     Head: Normocephalic.     Right Ear: Tympanic membrane, ear canal and external ear normal.     Left Ear: Tympanic membrane, ear canal and external ear normal.     Mouth/Throat:     Pharynx: Uvula midline.  Eyes:     Pupils: Pupils are equal, round, and reactive to light.  Cardiovascular:     Rate and Rhythm: Normal rate and regular rhythm.     Heart sounds: Normal heart sounds. No murmur heard. No friction rub. No gallop.   Pulmonary:     Effort: Pulmonary effort is normal. No respiratory distress.     Breath sounds: Normal breath sounds.  Abdominal:     General: Bowel sounds are normal. There is no distension.     Palpations: Abdomen is soft.     Tenderness: There is no abdominal tenderness.  Genitourinary:    Comments: Refused rectal following shared decision making.  Wants PSA Musculoskeletal:         General: Normal range of motion.  Skin:    General: Skin is warm and dry.  Neurological:     Mental Status: He is alert and oriented to person, place, and time.     Deep Tendon Reflexes: Reflexes are normal and symmetric.  Psychiatric:        Behavior: Behavior normal.        Thought Content: Thought content normal.        Judgment: Judgment normal.    Diabetic Foot Exam - Simple   Simple Foot Form Visual Inspection No deformities, no ulcerations, no other skin breakdown bilaterally: Yes Sensation Testing See comments: Yes Pulse Check Posterior Tibialis and Dorsalis pulse intact bilaterally: Yes Comments Some numbness left forefoot      Results for orders placed or performed in visit on 09/08/20  HM DIABETES EYE EXAM  Result Value Ref Range   HM Diabetic Eye Exam No Retinopathy No Retinopathy      Assessment & Plan:   Problem List Items Addressed This Visit      Unprioritized   RESOLVED: Adrenal tumor    Chart review shows MRI shows no adrenal tumor and f/u not needed      Atherosclerosis of abdominal aorta ([facility])   Relevant Medications   losartan (COZAAR) 100 MG tablet   amLODipine (NORVASC) 10 MG tablet   rosuvastatin (CRESTOR) 20 MG tablet   Benign essential HTN (Chronic)    A little high today.  Encouraged to monitor at home      Relevant Medications   losartan (COZAAR) 100 MG tablet   amLODipine (NORVASC) 10 MG tablet   rosuvastatin (CRESTOR) 20 MG tablet   RESOLVED: DM type 2, uncontrolled, with neuropathy (H[facility])   Relevant Medications   losartan (COZAAR) 100 MG tablet   glipiZIDE (GLUCOTROL) 10 MG tablet   empagliflozin (JARDIANCE) 25 MG TABS tablet   metFORMIN (GLUCOPHAGE) 1000 MG tablet   rosuvastatin (CRESTOR) 20 MG tablet   Dulaglutide (TRULICITY) 1.5 NO/7.0JG SOPN   Other Relevant Orders   Comprehensive metabolic panel   Hemoglobin A1c   Fatty liver disease, nonalcoholic    Check labs today      Relevant Medications   Dulaglutide  (TRULICITY) 1.5 GE/3.6OQ SOPN   Hyperlipidemia (Chronic)    On Crestor.  Check lipids today      Relevant Medications   losartan (COZAAR) 100 MG tablet   amLODipine (NORVASC) 10 MG tablet   rosuvastatin (CRESTOR) 20 MG tablet   Other Relevant Orders   Lipid panel   Insomnia    Stable, continue present medications.        Relevant Medications   traZODone (DESYREL) 100 MG tablet   Obesity (BMI 30.0-34.9)    Continue to work on diet and exercise      Sleep apnea (Chronic)    Wants a new machine.  Refer back to sleep study at [place] as diagnosed quite some time ago      Relevant Orders   Nocturnal polysomnography (NPSG)   Well controlled type 2 diabetes mellitus with peripheral neuropathy ([place])    Check Hgb A1C.  Foot exam shows stable neuropathy      Relevant Medications   losartan (COZAAR) 100 MG tablet   glipiZIDE (GLUCOTROL) 10 MG tablet   empagliflozin (JARDIANCE) 25 MG TABS tablet   metFORMIN (GLUCOPHAGE) 1000 MG tablet   rosuvastatin (CRESTOR) 20 MG tablet   Dulaglutide (TRULICITY) 1.5 HU/7.6LY SOPN   traZODone (DESYREL) 100 MG tablet    Other Visit Diagnoses    Colon cancer screening    -  Primary   Relevant  Orders   Ambulatory referral to Gastroenterology   Allergic rhinitis, unspecified seasonality, unspecified trigger       Stable on Flonase   Relevant Medications   fluticasone (FLONASE) 50 MCG/ACT nasal spray   Routine general medical examination at a health care facility       Relevant Orders   CBC with Differential/Platelet   TSH      HM: Remains Covid vaccine hesitant Refuses shingles Colonoscopy scheduled  Follow up plan: Return in about 6 months (around 04/17/2021).

## 2020-10-17 LAB — COMPREHENSIVE METABOLIC PANEL
ALT: 14 IU/L (ref 0–44)
AST: 22 IU/L (ref 0–40)
Albumin/Globulin Ratio: 1.6 (ref 1.2–2.2)
Albumin: 4.5 g/dL (ref 3.8–4.8)
Alkaline Phosphatase: 80 IU/L (ref 44–121)
BUN/Creatinine Ratio: 14 (ref 10–24)
BUN: 16 mg/dL (ref 8–27)
Bilirubin Total: 0.5 mg/dL (ref 0.0–1.2)
CO2: 18 mmol/L — ABNORMAL LOW (ref 20–29)
Calcium: 9.7 mg/dL (ref 8.6–10.2)
Chloride: 97 mmol/L (ref 96–106)
Creatinine, Ser: 1.13 mg/dL (ref 0.76–1.27)
Globulin, Total: 2.8 g/dL (ref 1.5–4.5)
Glucose: 98 mg/dL (ref 65–99)
Potassium: 4.4 mmol/L (ref 3.5–5.2)
Sodium: 133 mmol/L — ABNORMAL LOW (ref 134–144)
Total Protein: 7.3 g/dL (ref 6.0–8.5)
eGFR: 73 mL/min/{1.73_m2} (ref >59)

## 2020-10-17 LAB — LIPID PANEL
Chol/HDL Ratio: 2.6 ratio (ref 0.0–5.0)
Cholesterol, Total: 121 mg/dL (ref 100–199)
HDL: 46 mg/dL (ref >39)
LDL Chol Calc (NIH): 60 mg/dL (ref 0–99)
Triglycerides: 72 mg/dL (ref 0–149)
VLDL Cholesterol Cal: 15 mg/dL (ref 5–40)

## 2020-10-17 LAB — CBC WITH DIFFERENTIAL/PLATELET
Basophils Absolute: 0 10*3/uL (ref 0.0–0.2)
Basos: 1 %
EOS (ABSOLUTE): 0.1 10*3/uL (ref 0.0–0.4)
Eos: 2 %
Hematocrit: 42.9 % (ref 37.5–51.0)
Hemoglobin: 13.8 g/dL (ref 13.0–17.7)
Immature Grans (Abs): 0 10*3/uL (ref 0.0–0.1)
Immature Granulocytes: 0 %
Lymphocytes Absolute: 2.2 10*3/uL (ref 0.7–3.1)
Lymphs: 37 %
MCH: 28.1 pg (ref 26.6–33.0)
MCHC: 32.2 g/dL (ref 31.5–35.7)
MCV: 87 fL (ref 79–97)
Monocytes Absolute: 0.6 10*3/uL (ref 0.1–0.9)
Monocytes: 10 %
Neutrophils Absolute: 3 10*3/uL (ref 1.4–7.0)
Neutrophils: 50 %
Platelets: 275 10*3/uL (ref 150–450)
RBC: 4.91 x10E6/uL (ref 4.14–5.80)
RDW: 11.8 % (ref 11.6–15.4)
WBC: 6 10*3/uL (ref 3.4–10.8)

## 2020-10-17 LAB — HEMOGLOBIN A1C
Est. average glucose Bld gHb Est-mCnc: 183 mg/dL
Hgb A1c MFr Bld: 8 % — ABNORMAL HIGH (ref 4.8–5.6)

## 2020-10-17 LAB — TSH: TSH: 1.48 u[IU]/mL (ref 0.450–4.500)

## 2020-11-26 ENCOUNTER — Other Ambulatory Visit: Payer: Self-pay

## 2020-11-26 ENCOUNTER — Telehealth (INDEPENDENT_AMBULATORY_CARE_PROVIDER_SITE_OTHER): Payer: Self-pay | Admitting: Gastroenterology

## 2020-11-26 DIAGNOSIS — Z1211 Encounter for screening for malignant neoplasm of colon: Secondary | ICD-10-CM

## 2020-11-26 MED ORDER — SUPREP BOWEL PREP KIT 17.5-3.13-1.6 GM/177ML PO SOLN: 1.0000 | ORAL | 0 refills | Status: DC

## 2020-11-26 NOTE — Progress Notes (Signed)
Gastroenterology Pre-Procedure Review  Request Date: 12/19/20 Requesting Physician: Dr. Vicente Males  PATIENT REVIEW QUESTIONS: The patient responded to the following health history questions as indicated:    1. Are you having any GI issues? no 2. Do you have a personal history of Polyps? no 3. Do you have a family history of Colon Cancer or Polyps? No 4. Diabetes Mellitus? yes (Type 2) 5. Joint replacements in the past 12 months?no 6. Major health problems in the past 3 months?no 7. Any artificial heart valves, MVP, or defibrillator?no    MEDICATIONS & ALLERGIES:    Patient reports the following regarding taking any anticoagulation/antiplatelet therapy:   Plavix, Coumadin, Eliquis, Xarelto, Lovenox, Pradaxa, Brilinta, or Effient? no Aspirin? yes (Asa 44m)  Patient confirms/reports the following medications:  Current Outpatient Medications  Medication Sig Dispense Refill   amLODipine (NORVASC) 10 MG tablet Take 1 tablet (10 mg total) by mouth daily. 90 tablet 3   aspirin 81 MG tablet Take 81 mg by mouth daily.     Cholecalciferol (VITAMIN D) 2000 UNITS tablet Take 2,000 Units by mouth daily.     Dulaglutide (TRULICITY) 1.5 MNK/5.3ZJSOPN Inject contents of one syringe once a week 6 mL 3   empagliflozin (JARDIANCE) 25 MG TABS tablet Take 1 tablet (25 mg total) by mouth daily. 90 tablet 1   Flaxseed, Linseed, (FLAXSEED OIL) 1000 MG CAPS Take 1,000 mg by mouth daily.      fluticasone (FLONASE) 50 MCG/ACT nasal spray USE 2 SPRAYS IN EACH  NOSTRIL DAILY (IF NEEDED  FOR ALLERGY SYMPTOMS) 48 g 3   glipiZIDE (GLUCOTROL) 10 MG tablet TAKE 1 TABLET BY MOUTH TWICE DAILY BEFORE A MEAL FOR HIGH SUGARS (hold if blood sugar <100 or not eating) 180 tablet 0   losartan (COZAAR) 100 MG tablet Take 1 tablet (100 mg total) by mouth daily. 90 tablet 3   metFORMIN (GLUCOPHAGE) 1000 MG tablet Take 1 tablet (1,000 mg total) by mouth 2 (two) times daily with a meal. 180 tablet 3   Omega-3 Fatty Acids (FISH OIL) 1000  MG CAPS Take 1,000 mg by mouth daily.     rosuvastatin (CRESTOR) 20 MG tablet Take 1 tablet (20 mg total) by mouth daily. 90 tablet 3   traZODone (DESYREL) 100 MG tablet Take 1 tablet (100 mg total) by mouth at bedtime. 90 tablet 3   valACYclovir (VALTREX) 1000 MG tablet Take 1 tablet (1,000 mg total) by mouth 3 (three) times daily. 21 tablet 0   vitamin B-12 (CYANOCOBALAMIN) 1000 MCG tablet Take 1,000 mcg by mouth daily.     Na Sulfate-K Sulfate-Mg Sulf (SUPREP BOWEL PREP KIT) 17.5-3.13-1.6 GM/177ML SOLN Take 1 kit by mouth as directed. 354 mL 0   predniSONE (DELTASONE) 10 MG tablet Take 4 tablets once a day for 7 days, then take 3 Tablets once a day for 7 days, 2 tablets once a day for 7 days and 1 tablet once a day for 7 days (Patient not taking: Reported on 10/16/2020) 70 tablet 0   No current facility-administered medications for this visit.    Patient confirms/reports the following allergies:  Allergies  Allergen Reactions   Atorvastatin Other (See Comments)    Statins cause headaches and muscle aches    No orders of the defined types were placed in this encounter.   AUTHORIZATION INFORMATION Primary Insurance: 1D#: Group #:  Secondary Insurance: 1D#: Group #:  SCHEDULE INFORMATION: Date:  Time: Location:

## 2020-12-19 ENCOUNTER — Encounter
Admission: RE | Disposition: A | Discharge status: Home / Self Care | Payer: Self-pay | Source: Home / Self Care | Attending: Gastroenterology

## 2020-12-19 ENCOUNTER — Ambulatory Visit
Admission: RE | Admit: 2020-12-19 | Discharge: 2020-12-19 | Disposition: A | Discharge status: Home / Self Care | Payer: No Typology Code available for payment source | Attending: Gastroenterology | Admitting: Gastroenterology

## 2020-12-19 ENCOUNTER — Ambulatory Visit: Payer: No Typology Code available for payment source | Admitting: Certified Registered"

## 2020-12-19 ENCOUNTER — Encounter: Payer: Self-pay | Admitting: Gastroenterology

## 2020-12-19 DIAGNOSIS — Z7984 Long term (current) use of oral hypoglycemic drugs: Secondary | ICD-10-CM | POA: Insufficient documentation

## 2020-12-19 DIAGNOSIS — Z5309 Procedure and treatment not carried out because of other contraindication: Secondary | ICD-10-CM | POA: Insufficient documentation

## 2020-12-19 DIAGNOSIS — Z87891 Personal history of nicotine dependence: Secondary | ICD-10-CM | POA: Diagnosis not present

## 2020-12-19 DIAGNOSIS — Z1211 Encounter for screening for malignant neoplasm of colon: Secondary | ICD-10-CM

## 2020-12-19 DIAGNOSIS — Z7982 Long term (current) use of aspirin: Secondary | ICD-10-CM | POA: Diagnosis not present

## 2020-12-19 DIAGNOSIS — Z79899 Other long term (current) drug therapy: Secondary | ICD-10-CM | POA: Insufficient documentation

## 2020-12-19 HISTORY — PX: COLONOSCOPY WITH PROPOFOL: SHX5780

## 2020-12-19 LAB — GLUCOSE, CAPILLARY: Glucose-Capillary: 142 mg/dL — ABNORMAL HIGH (ref 70–99)

## 2020-12-19 SURGERY — COLONOSCOPY WITH PROPOFOL
Anesthesia: General

## 2020-12-19 MED ORDER — SODIUM CHLORIDE 0.9 % IV SOLN
INTRAVENOUS | Status: DC
  Administered 2020-12-19: 1000 mL via INTRAVENOUS

## 2020-12-19 NOTE — H&P (Signed)
[name], MD [address], [place], [place], [place], [address] [address], [place], [place], [place], [address] Phone: [phone]  Fax: [phone]  Primary Care Physician:  Delsa Grana, PA-C   Pre-Procedure History & Physical: HPI:  Marvin Wiswell. is a 65 y.o. male is here for an colonoscopy.   Past Medical History:  Diagnosis Date   Adrenal tumor 07/26/2018   Decreased libido    History of nuclear stress test    a. 05/2014: no evidence of significant ischemia, GI uptake was noted, EF 51%, no ECG changes concerning for ischemia, normal study   HTN (hypertension)    Hyperlipidemia    Hypertension    IDDM (insulin dependent diabetes mellitus)    Obesity    Pharyngitis    Sleep apnea    uses CPAP    Past Surgical History:  Procedure Laterality Date   AV FISTULA REPAIR     CARDIAC CATHETERIZATION     [facility]    COLONOSCOPY  2013    Prior to Admission medications   Medication Sig Start Date End Date Taking? Authorizing Provider  amLODipine (NORVASC) 10 MG tablet Take 1 tablet (10 mg total) by mouth daily. 10/16/20  Yes Kathrine Haddock, NP  aspirin 81 MG tablet Take 81 mg by mouth daily.   Yes [provider]  Cholecalciferol (VITAMIN D) 2000 UNITS tablet Take 2,000 Units by mouth daily.   Yes [provider]  Dulaglutide (TRULICITY) 1.5 TX/6.4WO SOPN Inject contents of one syringe once a week 10/16/20  Yes Kathrine Haddock, NP  empagliflozin (JARDIANCE) 25 MG TABS tablet Take 1 tablet (25 mg total) by mouth daily. 10/16/20  Yes Kathrine Haddock, NP  Flaxseed, Linseed, (FLAXSEED OIL) 1000 MG CAPS Take 1,000 mg by mouth daily.    Yes [provider]  fluticasone (FLONASE) 50 MCG/ACT nasal spray USE 2 SPRAYS IN EACH  NOSTRIL DAILY (IF NEEDED  FOR ALLERGY SYMPTOMS) 10/16/20  Yes Kathrine Haddock, NP  glipiZIDE (GLUCOTROL) 10 MG tablet TAKE 1 TABLET BY MOUTH TWICE DAILY BEFORE A MEAL FOR HIGH SUGARS (hold if blood sugar <100 or not eating) 10/16/20  Yes  Kathrine Haddock, NP  losartan (COZAAR) 100 MG tablet Take 1 tablet (100 mg total) by mouth daily. 10/16/20  Yes Kathrine Haddock, NP  metFORMIN (GLUCOPHAGE) 1000 MG tablet Take 1 tablet (1,000 mg total) by mouth 2 (two) times daily with a meal. 10/16/20  Yes Kathrine Haddock, NP  Na Sulfate-K Sulfate-Mg Sulf (SUPREP BOWEL PREP KIT) 17.5-3.13-1.6 GM/177ML SOLN Take 1 kit by mouth as directed. 11/26/20  Yes [name], MD  Omega-3 Fatty Acids (FISH OIL) 1000 MG CAPS Take 1,000 mg by mouth daily.   Yes [provider]  rosuvastatin (CRESTOR) 20 MG tablet Take 1 tablet (20 mg total) by mouth daily. 10/16/20  Yes Kathrine Haddock, NP  traZODone (DESYREL) 100 MG tablet Take 1 tablet (100 mg total) by mouth at bedtime. 10/16/20  Yes Kathrine Haddock, NP  valACYclovir (VALTREX) 1000 MG tablet Take 1 tablet (1,000 mg total) by mouth 3 (three) times daily. 01/11/20  Yes Letitia Neri L, PA-C  vitamin B-12 (CYANOCOBALAMIN) 1000 MCG tablet Take 1,000 mcg by mouth daily.   Yes [provider]  predniSONE (DELTASONE) 10 MG tablet Take 4 tablets once a day for 7 days, then take 3 Tablets once a day for 7 days, 2 tablets once a day for 7 days and 1 tablet once a day for 7 days Patient  not taking: Reported on 10/16/2020 01/11/20   Johnn Hai, PA-C    Allergies as of 11/26/2020 - Review Complete 10/16/2020  Allergen Reaction Noted   Atorvastatin Other (See Comments) 04/26/2014    Family History  Problem Relation Age of Onset   Breast cancer Mother    Cancer Mother    Kidney failure Father    Diabetes Father    Alcohol abuse Father    Diabetes Sister    Other Daughter     Social History   Socioeconomic History   Marital status: Married    Spouse name: Holley Raring   Number of children: 3   Years of education: Not on file   Highest education level: Some college, no degree  Occupational History   Not on file  Tobacco Use   Smoking status: Former    Packs/day: 0.25    Years: 25.00    Pack years:  6.25    Types: Cigarettes    Quit date: 05/17/1998    Years since quitting: 22.6   Smokeless tobacco: Never   Tobacco comments:    off and on - maybe 5 cigarettes  Vaping Use   Vaping Use: Never used  Substance and Sexual Activity   Alcohol use: No    Alcohol/week: 0.0 standard drinks    Comment: previously but not currently   Drug use: No   Sexual activity: Yes    Partners: Female  Other Topics Concern   Not on file  Social History Narrative   Not on file   Social Determinants of Health   Financial Resource Strain: Low Risk    Difficulty of Paying Living Expenses: Not hard at all  Food Insecurity: No Food Insecurity   Worried About Charity fundraiser in the Last Year: Never true   Ran Out of Food in the Last Year: Never true  Transportation Needs: No Transportation Needs   Lack of Transportation (Medical): No   Lack of Transportation (Non-Medical): No  Physical Activity: Insufficiently Active   Days of Exercise per Week: 2 days   Minutes of Exercise per Session: 30 min  Stress: No Stress Concern Present   Feeling of Stress : Only a little  Social Connections: Engineer, building services of Communication with Friends and Family: Twice a week   Frequency of Social Gatherings with Friends and Family: Twice a week   Attends Religious Services: More than 4 times per year   Active Member of [employer] or Organizations: Yes   Attends Music therapist: More than 4 times per year   Marital Status: Married  Human resources officer Violence: Not At Risk   Fear of Current or Ex-Partner: No   Emotionally Abused: No   Physically Abused: No   Sexually Abused: No    Review of Systems: See HPI, otherwise negative ROS  Physical Exam: There were no vitals taken for this visit. General:   Alert,  pleasant and cooperative in NAD Head:  Normocephalic and atraumatic. Neck:  Supple; no masses or thyromegaly. Lungs:  Clear throughout to auscultation, normal respiratory effort.     Heart:  +S1, +S2, Regular rate and rhythm, No edema. Abdomen:  Soft, nontender and nondistended. Normal bowel sounds, without guarding, and without rebound.   Neurologic:  Alert and  oriented x4;  grossly normal neurologically.  Impression/Plan: Marvin Sauger Sr. is here for an colonoscopy to be performed for Screening colonoscopy average risk   Risks, benefits, limitations, and alternatives regarding  colonoscopy have  been reviewed with the patient.  Questions have been answered.  All parties agreeable.   [name], MD  12/19/2020, 10:24 AM

## 2020-12-19 NOTE — OR Nursing (Signed)
Pt with new onset afib on ekg. Per dr Bertell Maria . Hold procedure and obtain stat ekg. Pt rolled to postop

## 2020-12-19 NOTE — Progress Notes (Signed)
Patient with apparent A-fib on monitor. 12 lead EKG showing atrial fibrillation. Per patient, he has not known of this diagnosis. Most recent EKG in system from 5 years ago showing NSR. Discussed with GI doctor and patient, safest thing would be to postpone the case until this could be worked up outpatient.

## 2020-12-19 NOTE — OR Nursing (Signed)
GI Procedure cancelled due to new onset a-fib per Dr. [name], anesthesiologist. Patient instructed to follow up with primary care provider or a cardiologist.  Patient verbalized understanding and stated he will see his PCP.

## 2020-12-22 ENCOUNTER — Encounter: Payer: Self-pay | Admitting: Gastroenterology

## 2021-01-02 ENCOUNTER — Encounter: Payer: Self-pay | Admitting: Family Medicine

## 2021-01-02 ENCOUNTER — Ambulatory Visit (INDEPENDENT_AMBULATORY_CARE_PROVIDER_SITE_OTHER): Payer: No Typology Code available for payment source | Admitting: Family Medicine

## 2021-01-02 ENCOUNTER — Other Ambulatory Visit: Payer: Self-pay

## 2021-01-02 VITALS — BP 124/68 | HR 87 | Temp 98.2°F | Resp 16 | Ht 74.0 in | Wt 252.3 lb

## 2021-01-02 DIAGNOSIS — I4891 Unspecified atrial fibrillation: Secondary | ICD-10-CM | POA: Diagnosis not present

## 2021-01-02 DIAGNOSIS — E669 Obesity, unspecified: Secondary | ICD-10-CM

## 2021-01-02 DIAGNOSIS — E782 Mixed hyperlipidemia: Secondary | ICD-10-CM

## 2021-01-02 DIAGNOSIS — Z6832 Body mass index (BMI) 32.0-32.9, adult: Secondary | ICD-10-CM

## 2021-01-02 DIAGNOSIS — G4739 Other sleep apnea: Secondary | ICD-10-CM

## 2021-01-02 DIAGNOSIS — IMO0002 Reserved for concepts with insufficient information to code with codable children: Secondary | ICD-10-CM

## 2021-01-02 DIAGNOSIS — I10 Essential (primary) hypertension: Secondary | ICD-10-CM

## 2021-01-02 DIAGNOSIS — R002 Palpitations: Secondary | ICD-10-CM

## 2021-01-02 DIAGNOSIS — E1165 Type 2 diabetes mellitus with hyperglycemia: Secondary | ICD-10-CM

## 2021-01-02 DIAGNOSIS — E1140 Type 2 diabetes mellitus with diabetic neuropathy, unspecified: Secondary | ICD-10-CM | POA: Diagnosis not present

## 2021-01-02 DIAGNOSIS — E66811 Obesity, class 1: Secondary | ICD-10-CM

## 2021-01-02 NOTE — Progress Notes (Signed)
Patient ID: Marvin Duncan., male    DOB: 1955-10-13, 65 y.o.   MRN: [id number]  PCP: Delsa Grana, PA-C  Chief Complaint  Patient presents with   Atrial Fibrillation    Found on preop    Subjective:   Marvin Duncan. is a 65 y.o. male, presents to clinic with CC of the following:  HPI  Here for f/up after colonoscopy was cancelled on 12/19/20 due to new onset A-fib Notes from endoscopy RN on 8/5 -  GI Procedure cancelled due to new onset a-fib per Dr. Bertell Maria, anesthesiologist. Patient instructed to follow up with primary care provider or a cardiologist.  Patient verbalized understanding and stated he will see his PCP.   Hx of HTN fairly well controlled, uncontrolled DM with neuropathy and hyperglycemia, OSA, obesity, HLD  Recent labs, OV, weights, prior ECG from 12/19/20 and 2017 and 2016 reviewed Wt Readings from Last 5 Encounters:  01/02/21 252 lb 4.8 oz (114.4 kg)  12/19/20 258 lb 8.5 oz (117.3 kg)  10/16/20 258 lb 4.8 oz (117.2 kg)  04/04/20 258 lb 3.2 oz (117.1 kg)  01/11/20 260 lb (117.9 kg)   BMI Readings from Last 5 Encounters:  01/02/21 32.39 kg/m  12/19/20 34.11 kg/m  10/16/20 33.16 kg/m  04/04/20 33.15 kg/m  01/11/20 33.38 kg/m   BP Readings from Last 3 Encounters:  01/02/21 124/68  12/19/20 131/74  10/16/20 138/82  On losartan 100 mg and amlodipine 10 mg daily  Pulse Readings from Last 3 Encounters:  01/02/21 87  12/19/20 75  10/16/20 77    Lab Results  Component Value Date   HGBA1C 8.0 (H) 10/16/2020  On glipizide jardiance metformin and trulicity Doesn't check sugars but knows when sugars are high because of nocturia  Pt states that he has had similar heart issues in the past many years ago, but when he went to cardiology for f/up everything was normal   Prior ECHO reviewed 06/03/2015 - did see Dr. Fletcher Anon in the past in 2016 for eval of CP - reviewed old note - no mention of prior A fib  Pt reports that he does experience palpitations come  and go and that the morning of his colonoscopy he was very stressed - he was on prednisone and antibiotics for the preceding week and he had high sugars, polyuria and then did prep for procedure   Palpitations episodes have been felt prior to that morning, he denies associated lightheadedness, passing out, diaphoresis, CP, SOB.  No orthopnea, DOE, LE edema, PND.    Patient Active Problem List   Diagnosis Date Noted   Atrial fibrillation, new onset ([place]) 01/02/2021   Thoracic degenerative disc disease 11/29/2018   Insomnia 11/29/2018   Fatty liver disease, nonalcoholic 23/30/0762   Atherosclerosis of abdominal aorta ([place]) 08/01/2018   Benign prostatic hyperplasia with urinary frequency 07/21/2018   Class 1 obesity with serious comorbidity and body mass index (BMI) of 34.0 to 34.9 in adult 04/17/2018   Ventral hernia without obstruction or gangrene 05/13/2016   GERD (gastroesophageal reflux disease) 01/04/2016   DM type 2, uncontrolled, with neuropathy ([place]) 03/06/2015   Erectile dysfunction due to arterial insufficiency 10/24/2014   Hyperlipidemia 05/23/2014   Sleep apnea 05/23/2014   Benign essential HTN 02/14/2007      Current Outpatient Medications:    amLODipine (NORVASC) 10 MG tablet, Take 1 tablet (10 mg total) by mouth daily., Disp: 90 tablet, Rfl: 3   aspirin 81 MG tablet, Take 81 mg  by mouth daily., Disp: , Rfl:    Cholecalciferol (VITAMIN D) 2000 UNITS tablet, Take 2,000 Units by mouth daily., Disp: , Rfl:    Dulaglutide (TRULICITY) 1.5 TZ/0.0FV SOPN, Inject contents of one syringe once a week, Disp: 6 mL, Rfl: 3   empagliflozin (JARDIANCE) 25 MG TABS tablet, Take 1 tablet (25 mg total) by mouth daily., Disp: 90 tablet, Rfl: 1   Flaxseed, Linseed, (FLAXSEED OIL) 1000 MG CAPS, Take 1,000 mg by mouth daily. , Disp: , Rfl:    fluticasone (FLONASE) 50 MCG/ACT nasal spray, USE 2 SPRAYS IN EACH  NOSTRIL DAILY (IF NEEDED  FOR ALLERGY SYMPTOMS), Disp: 48 g, Rfl: 3   glipiZIDE  (GLUCOTROL) 10 MG tablet, TAKE 1 TABLET BY MOUTH TWICE DAILY BEFORE A MEAL FOR HIGH SUGARS (hold if blood sugar <100 or not eating), Disp: 180 tablet, Rfl: 0   losartan (COZAAR) 100 MG tablet, Take 1 tablet (100 mg total) by mouth daily., Disp: 90 tablet, Rfl: 3   metFORMIN (GLUCOPHAGE) 1000 MG tablet, Take 1 tablet (1,000 mg total) by mouth 2 (two) times daily with a meal., Disp: 180 tablet, Rfl: 3   Na Sulfate-K Sulfate-Mg Sulf (SUPREP BOWEL PREP KIT) 17.5-3.13-1.6 GM/177ML SOLN, Take 1 kit by mouth as directed., Disp: 354 mL, Rfl: 0   Omega-3 Fatty Acids (FISH OIL) 1000 MG CAPS, Take 1,000 mg by mouth daily., Disp: , Rfl:    rosuvastatin (CRESTOR) 20 MG tablet, Take 1 tablet (20 mg total) by mouth daily., Disp: 90 tablet, Rfl: 3   traZODone (DESYREL) 100 MG tablet, Take 1 tablet (100 mg total) by mouth at bedtime., Disp: 90 tablet, Rfl: 3   valACYclovir (VALTREX) 1000 MG tablet, Take 1 tablet (1,000 mg total) by mouth 3 (three) times daily., Disp: 21 tablet, Rfl: 0   vitamin B-12 (CYANOCOBALAMIN) 1000 MCG tablet, Take 1,000 mcg by mouth daily., Disp: , Rfl:    Allergies  Allergen Reactions   Atorvastatin Other (See Comments)    Statins cause headaches and muscle aches     Social History   Tobacco Use   Smoking status: Former    Packs/day: 0.25    Years: 25.00    Pack years: 6.25    Types: Cigarettes    Quit date: 05/17/1998    Years since quitting: 22.6   Smokeless tobacco: Never   Tobacco comments:    off and on - maybe 5 cigarettes  Vaping Use   Vaping Use: Never used  Substance Use Topics   Alcohol use: No    Alcohol/week: 0.0 standard drinks    Comment: previously but not currently   Drug use: No      Chart Review Today: I personally reviewed active problem list, medication list, allergies, family history, social history, health maintenance, notes from last encounter, lab results, imaging with the patient/caregiver today.   Review of Systems  Constitutional:  Negative.   HENT: Negative.    Eyes: Negative.   Respiratory: Negative.    Cardiovascular: Negative.   Gastrointestinal: Negative.   Endocrine: Negative.   Genitourinary: Negative.   Musculoskeletal: Negative.   Skin: Negative.   Allergic/Immunologic: Negative.   Neurological: Negative.   Hematological: Negative.   Psychiatric/Behavioral: Negative.    All other systems reviewed and are negative.     Objective:   Vitals:   01/02/21 1439 01/02/21 1518  BP: 126/82 124/68  Pulse: 87   Resp: 16   Temp: 98.2 F (36.8 C)   SpO2: 97%   Weight: 252  lb 4.8 oz (114.4 kg)   Height: '6\' 2"'  (1.88 m)     Body mass index is 32.39 kg/m.  Physical Exam Vitals and nursing note reviewed.  Constitutional:      General: He is not in acute distress.    Appearance: Normal appearance. He is well-developed. He is obese. He is not ill-appearing, toxic-appearing or diaphoretic.     Interventions: Face mask in place.  HENT:     Head: Normocephalic and atraumatic.     Jaw: No trismus.     Right Ear: External ear normal.     Left Ear: External ear normal.  Eyes:     General: Lids are normal. No scleral icterus.       Right eye: No discharge.        Left eye: No discharge.     Conjunctiva/sclera: Conjunctivae normal.  Neck:     Trachea: Trachea and phonation normal. No tracheal deviation.  Cardiovascular:     Rate and Rhythm: Normal rate and regular rhythm.     Pulses: Normal pulses.          Radial pulses are 2+ on the right side and 2+ on the left side.       Posterior tibial pulses are 2+ on the right side and 2+ on the left side.     Heart sounds: Normal heart sounds. No murmur heard.   No friction rub. No gallop.  Pulmonary:     Effort: Pulmonary effort is normal. No respiratory distress.     Breath sounds: Normal breath sounds. No stridor. No wheezing, rhonchi or rales.  Abdominal:     General: Bowel sounds are normal. There is no distension.     Palpations: Abdomen is soft.   Musculoskeletal:     Right lower leg: No edema.     Left lower leg: No edema.  Skin:    General: Skin is warm and dry.     Coloration: Skin is not jaundiced.     Findings: No rash.     Nails: There is no clubbing.  Neurological:     Mental Status: He is alert. Mental status is at baseline.     Cranial Nerves: No dysarthria or facial asymmetry.     Motor: No tremor or abnormal muscle tone.     Gait: Gait normal.  Psychiatric:        Mood and Affect: Mood normal.        Speech: Speech normal.        Behavior: Behavior normal. Behavior is cooperative.     Results for orders placed or performed during the hospital encounter of 12/19/20  Glucose, capillary  Result Value Ref Range   Glucose-Capillary 142 (H) 70 - 99 mg/dL   ECG interpretation   Date: 01/02/21  Rate:  78  Rhythm: normal sinus  QRS Axis: normal  Intervals: normal  ST/T Wave abnormalities: no ST elevation or depression, non-specific Twave abnormality to V6/lateral lead  Conduction Disutrbances: none   Old EKG Reviewed: change compared to 12/19/2020 ECG - sinus rhythm has replaced A fib   Old EKG reviewed - same as prior - even non-specific T-wave abnormality in V6 05/01/2015     Assessment & Plan:     ICD-10-CM   1. Atrial fibrillation, new onset (HCC)  I48.91 EKG 12-Lead    TSH    CBC with Differential/Platelet    Magnesium    Comprehensive Metabolic Panel (CMET)    Ambulatory referral to Cardiology  paroxymal? pt having intermittent palpitations, back in sinus rhythm today, refer back to Dr. Fletcher Anon for further eval/possible holter monitor    2. DM type 2, uncontrolled, with neuropathy (HCC)  E11.40 Comprehensive Metabolic Panel (CMET)   W46.52 Ambulatory referral to Cardiology   poorly controlled, pt not checking sugars, recently exacerbated by prednisone    3. Other sleep apnea  G47.39 CBC with Differential/Platelet    Comprehensive Metabolic Panel (CMET)    4. Mixed hyperlipidemia  E78.2 CBC with  Differential/Platelet    Comprehensive Metabolic Panel (CMET)    Ambulatory referral to Cardiology   on statin     5. Benign essential HTN  I10 Magnesium    Comprehensive Metabolic Panel (CMET)    Ambulatory referral to Cardiology   BP elevated initially with exam today, improved and BP at goal on current meds    6. Class 1 obesity with serious comorbidity and body mass index (BMI) of 32.0 to 32.9 in adult, unspecified obesity type  E66.9 TSH   Z68.32 Ambulatory referral to Cardiology   weight down a little    7. Palpitations  R00.2 Ambulatory referral to Cardiology            Delsa Grana, PA-C 01/02/21 4:52 PM

## 2021-01-06 LAB — COMPREHENSIVE METABOLIC PANEL
ALT: 15 IU/L (ref 0–44)
AST: 17 IU/L (ref 0–40)
Albumin/Globulin Ratio: 1.6 (ref 1.2–2.2)
Albumin: 4.3 g/dL (ref 3.8–4.8)
Alkaline Phosphatase: 81 IU/L (ref 44–121)
BUN/Creatinine Ratio: 12 (ref 10–24)
BUN: 14 mg/dL (ref 8–27)
Bilirubin Total: 0.4 mg/dL (ref 0.0–1.2)
CO2: 21 mmol/L (ref 20–29)
Calcium: 9.5 mg/dL (ref 8.6–10.2)
Chloride: 105 mmol/L (ref 96–106)
Creatinine, Ser: 1.14 mg/dL (ref 0.76–1.27)
Globulin, Total: 2.7 g/dL (ref 1.5–4.5)
Glucose: 192 mg/dL — ABNORMAL HIGH (ref 65–99)
Potassium: 4.4 mmol/L (ref 3.5–5.2)
Sodium: 141 mmol/L (ref 134–144)
Total Protein: 7 g/dL (ref 6.0–8.5)
eGFR: 72 mL/min/{1.73_m2} (ref >59)

## 2021-01-06 LAB — CBC WITH DIFFERENTIAL/PLATELET
Basophils Absolute: 0.1 10*3/uL (ref 0.0–0.2)
Basos: 1 %
EOS (ABSOLUTE): 0.3 10*3/uL (ref 0.0–0.4)
Eos: 5 %
Hematocrit: 43.2 % (ref 37.5–51.0)
Hemoglobin: 14 g/dL (ref 13.0–17.7)
Immature Grans (Abs): 0 10*3/uL (ref 0.0–0.1)
Immature Granulocytes: 0 %
Lymphocytes Absolute: 2.4 10*3/uL (ref 0.7–3.1)
Lymphs: 39 %
MCH: 28.1 pg (ref 26.6–33.0)
MCHC: 32.4 g/dL (ref 31.5–35.7)
MCV: 87 fL (ref 79–97)
Monocytes Absolute: 0.5 10*3/uL (ref 0.1–0.9)
Monocytes: 8 %
Neutrophils Absolute: 2.9 10*3/uL (ref 1.4–7.0)
Neutrophils: 47 %
Platelets: 226 10*3/uL (ref 150–450)
RBC: 4.99 x10E6/uL (ref 4.14–5.80)
RDW: 11.7 % (ref 11.6–15.4)
WBC: 6.2 10*3/uL (ref 3.4–10.8)

## 2021-01-06 LAB — TSH: TSH: 1.92 u[IU]/mL (ref 0.450–4.500)

## 2021-01-06 LAB — MAGNESIUM: Magnesium: 2.3 mg/dL (ref 1.6–2.3)

## 2021-01-27 ENCOUNTER — Ambulatory Visit: Payer: No Typology Code available for payment source | Admitting: Nurse Practitioner

## 2021-01-27 ENCOUNTER — Other Ambulatory Visit: Payer: Self-pay

## 2021-01-27 ENCOUNTER — Encounter: Payer: Self-pay | Admitting: Nurse Practitioner

## 2021-01-27 ENCOUNTER — Ambulatory Visit (INDEPENDENT_AMBULATORY_CARE_PROVIDER_SITE_OTHER): Payer: No Typology Code available for payment source

## 2021-01-27 VITALS — BP 114/72 | HR 86 | Ht 73.0 in | Wt 253.0 lb

## 2021-01-27 DIAGNOSIS — E782 Mixed hyperlipidemia: Secondary | ICD-10-CM | POA: Diagnosis not present

## 2021-01-27 DIAGNOSIS — I10 Essential (primary) hypertension: Secondary | ICD-10-CM | POA: Diagnosis not present

## 2021-01-27 DIAGNOSIS — I480 Paroxysmal atrial fibrillation: Secondary | ICD-10-CM | POA: Diagnosis not present

## 2021-01-27 DIAGNOSIS — G4733 Obstructive sleep apnea (adult) (pediatric): Secondary | ICD-10-CM

## 2021-01-27 DIAGNOSIS — E1165 Type 2 diabetes mellitus with hyperglycemia: Secondary | ICD-10-CM

## 2021-01-27 DIAGNOSIS — I4891 Unspecified atrial fibrillation: Secondary | ICD-10-CM | POA: Diagnosis not present

## 2021-01-27 DIAGNOSIS — IMO0002 Reserved for concepts with insufficient information to code with codable children: Secondary | ICD-10-CM

## 2021-01-27 DIAGNOSIS — E1140 Type 2 diabetes mellitus with diabetic neuropathy, unspecified: Secondary | ICD-10-CM

## 2021-01-27 MED ORDER — METOPROLOL TARTRATE 25 MG PO TABS: 25.0000 mg | ORAL_TABLET | Freq: Two times a day (BID) | ORAL | 2 refills | Status: DC

## 2021-01-27 MED ORDER — APIXABAN 5 MG PO TABS: 5.0000 mg | ORAL_TABLET | Freq: Two times a day (BID) | ORAL | 1 refills | Status: DC

## 2021-01-27 MED ORDER — AMLODIPINE BESYLATE 5 MG PO TABS: 5.0000 mg | ORAL_TABLET | Freq: Every day | ORAL | 2 refills | Status: DC

## 2021-01-27 NOTE — Progress Notes (Signed)
Cardiology Clinic Note   Patient Name: Marvin TODT Sr. Date of Encounter: 01/27/2021  Primary Care Provider:  Delsa Grana, PA-C Primary Cardiologist:  Kathlyn Sacramento, MD  Patient Profile    65 year old male with a history of type 2 diabetes mellitus diagnosed in 2000, hypertension, hyperlipidemia, obesity, sleep apnea, and chest pain with prior normal stress testing (2016; 2017), presents for follow-up related to recently diagnosed PAF.  Past Medical History    Past Medical History:  Diagnosis Date   Adrenal tumor 07/26/2018   Aortic atherosclerosis ([place])    a. 07/2018 CT chest: Atherosclerotic calcifications of the thoracic aorta.   Chest pain    a. 05/2014 Ex MV: no evidence of significant ischemia, GI uptake was noted, EF 51%, no ECG changes concerning for ischemia, normal study; b. 05/2015 Ex MV: No ischemia/infarct. EF 38% (2/2 atten artifact-->EF 55-60% by echo 05/2015).   Decreased libido    History of echocardiogram    a. 05/2015 Echo: EF 55-60%, mild LVH. Mild MR. Mildly dil LA/RV.   HTN (hypertension)    Hyperlipidemia    IDDM (insulin dependent diabetes mellitus)    Obesity    PAF (paroxysmal atrial fibrillation) ([place])    a. Dx 12/2020. CHA2DS2VASc = 2.   Pharyngitis    Sleep apnea    uses CPAP   Past Surgical History:  Procedure Laterality Date   AV FISTULA REPAIR     CARDIAC CATHETERIZATION     [facility]    COLONOSCOPY  2013   COLONOSCOPY WITH PROPOFOL N/A 12/19/2020   Procedure: COLONOSCOPY WITH PROPOFOL;  Surgeon: [name], MD;  Location: [facility] ENDOSCOPY;  Service: Gastroenterology;  Laterality: N/A;    Allergies  Allergies  Allergen Reactions   Atorvastatin Other (See Comments)    Statins cause headaches and muscle aches    History of Present Illness    65 year old male with the above past medical history including type 2 diabetes mellitus previously diagnosed in 2000, hypertension, hyperlipidemia, obesity, sleep apnea (uses CPAP nightly), and chest  pain.  He was previously seen in January 2016 in the setting of chest pain with generalized cramps prompting ED visit.  He underwent stress testing that showed no evidence of ischemia and an EF of 51%.  In December 2016, he again presented to the emergency department with chest pain and normal troponins.  He had nonspecific ST and T changes in inferolateral leads and was advised to follow-up with his cardiologist.  He was seen in the office in December 2016 and given recent chest pain symptoms with ongoing risk factors, he underwent repeat stress testing in January 2017, which was again low risk without evidence of ischemia or infarct.  An echocardiogram was also carried out and showed an EF of 55 to 60% with mild LVH and mild mitral regurgitation.  Patient has not required cardiology follow-up in the past several years.  He works locally and notes that he does a fair amount of activity at work, frequently walking throughout the day without significant limitations.  Over the past several months, he has noted occasional elevations in heart rate with activity associated with very mild heaviness in his chest.  When this occurs, he will stop walking and will pass pretty quickly.  He is then able to be active for the remainder of the day without any recurrent symptoms.  Over the same period of time, he has noted intermittent palpitations, especially occurring at night when he is lying down and resting.  He describes  these palpitations as being an elevation in heart rate with skipped beats.  There are no other symptoms, and palpitations subside in under an hour.  On August 5, he presented for routine colonoscopy and was noted to be in atrial fibrillation at 88 bpm.  He says he was completely asymptomatic.  His procedure was canceled and he was advised to follow-up with primary care and cardiology.  He was seen by primary care on August 19, at which time he was in sinus rhythm.  Labs drawn on August 22 were unremarkable  with normal electrolytes, renal function, blood counts, and TSH.  He denies dyspnea, PND, orthopnea, dizziness, syncope, edema, or early satiety.  Home Medications    Current Outpatient Medications  Medication Sig Dispense Refill   apixaban (ELIQUIS) 5 MG TABS tablet Take 1 tablet (5 mg total) by mouth 2 (two) times daily. 60 tablet 1   aspirin 81 MG tablet Take 81 mg by mouth daily.     Cholecalciferol (VITAMIN D) 2000 UNITS tablet Take 2,000 Units by mouth daily.     Dulaglutide (TRULICITY) 1.5 ZO/1.0RU SOPN Inject contents of one syringe once a week 6 mL 3   empagliflozin (JARDIANCE) 25 MG TABS tablet Take 1 tablet (25 mg total) by mouth daily. 90 tablet 1   Flaxseed, Linseed, (FLAXSEED OIL) 1000 MG CAPS Take 1,000 mg by mouth daily.      fluticasone (FLONASE) 50 MCG/ACT nasal spray USE 2 SPRAYS IN EACH  NOSTRIL DAILY (IF NEEDED  FOR ALLERGY SYMPTOMS) 48 g 3   glipiZIDE (GLUCOTROL) 10 MG tablet TAKE 1 TABLET BY MOUTH TWICE DAILY BEFORE A MEAL FOR HIGH SUGARS (hold if blood sugar <100 or not eating) 180 tablet 0   losartan (COZAAR) 100 MG tablet Take 1 tablet (100 mg total) by mouth daily. 90 tablet 3   metFORMIN (GLUCOPHAGE) 1000 MG tablet Take 1 tablet (1,000 mg total) by mouth 2 (two) times daily with a meal. 180 tablet 3   metoprolol tartrate (LOPRESSOR) 25 MG tablet Take 1 tablet (25 mg total) by mouth 2 (two) times daily. 60 tablet 2   Na Sulfate-K Sulfate-Mg Sulf (SUPREP BOWEL PREP KIT) 17.5-3.13-1.6 GM/177ML SOLN Take 1 kit by mouth as directed. 354 mL 0   Omega-3 Fatty Acids (FISH OIL) 1000 MG CAPS Take 1,000 mg by mouth daily.     rosuvastatin (CRESTOR) 20 MG tablet Take 1 tablet (20 mg total) by mouth daily. 90 tablet 3   traZODone (DESYREL) 100 MG tablet Take 1 tablet (100 mg total) by mouth at bedtime. 90 tablet 3   valACYclovir (VALTREX) 1000 MG tablet Take 1 tablet (1,000 mg total) by mouth 3 (three) times daily. 21 tablet 0   vitamin B-12 (CYANOCOBALAMIN) 1000 MCG tablet Take  1,000 mcg by mouth daily.     amLODipine (NORVASC) 5 MG tablet Take 1 tablet (5 mg total) by mouth daily. 30 tablet 2   No current facility-administered medications for this visit.     Family History    Family History  Problem Relation Age of Onset   Breast cancer Mother    Cancer Mother    Kidney failure Father    Diabetes Father    Alcohol abuse Father    Diabetes Sister    Other Daughter    He indicated that his mother is deceased. He indicated that his father is deceased. He indicated that all of his three sisters are alive. He indicated that both of his brothers are deceased. He  indicated that his maternal grandmother is deceased. He indicated that his maternal grandfather is deceased. He indicated that his paternal grandmother is deceased. He indicated that his paternal grandfather is deceased. He indicated that only one of his two daughters is alive. He indicated that his son is alive.  Social History    Social History   Socioeconomic History   Marital status: Married    Spouse name: Glenda   Number of children: 3   Years of education: Not on file   Highest education level: Some college, no degree  Occupational History   Not on file  Tobacco Use   Smoking status: Former    Packs/day: 0.25    Years: 25.00    Pack years: 6.25    Types: Cigarettes    Quit date: 05/17/1998    Years since quitting: 22.7   Smokeless tobacco: Never   Tobacco comments:    off and on - maybe 5 cigarettes  Vaping Use   Vaping Use: Never used  Substance and Sexual Activity   Alcohol use: No    Alcohol/week: 0.0 standard drinks    Comment: previously but not currently   Drug use: No   Sexual activity: Yes    Partners: Female  Other Topics Concern   Not on file  Social History Narrative   Not on file   Social Determinants of Health   Financial Resource Strain: Low Risk    Difficulty of Paying Living Expenses: Not hard at all  Food Insecurity: No Food Insecurity   Worried About  Charity fundraiser in the Last Year: Never true   [place] in the Last Year: Never true  Transportation Needs: No Transportation Needs   Lack of Transportation (Medical): No   Lack of Transportation (Non-Medical): No  Physical Activity: Insufficiently Active   Days of Exercise per Week: 2 days   Minutes of Exercise per Session: 30 min  Stress: No Stress Concern Present   Feeling of Stress : Only a little  Social Connections: Engineer, building services of Communication with Friends and Family: Twice a week   Frequency of Social Gatherings with Friends and Family: Twice a week   Attends Religious Services: More than 4 times per year   Active Member of [employer] or Organizations: Yes   Attends Music therapist: More than 4 times per year   Marital Status: Married  Human resources officer Violence: Not At Risk   Fear of Current or Ex-Partner: No   Emotionally Abused: No   Physically Abused: No   Sexually Abused: No     Review of Systems    General:  No chills, fever, night sweats or weight changes.  Cardiovascular: +++ mild chest heaviness associated with elevations in heart rate/palpitations.  No dyspnea on exertion, edema, orthopnea, paroxysmal nocturnal dyspnea. Dermatological: No rash, lesions/masses Respiratory: No cough, dyspnea Urologic: No hematuria, dysuria Abdominal:   No nausea, vomiting, diarrhea, bright red blood per rectum, melena, or hematemesis Neurologic:  No visual changes, wkns, changes in mental status. All other systems reviewed and are otherwise negative except as noted above.  Physical Exam    VS:  BP 114/72 (BP Location: Left Arm, Patient Position: Sitting, Cuff Size: Large)   Pulse 86   Ht '6\' 1"'  (1.854 m)   Wt 253 lb (114.8 kg)   SpO2 97%   BMI 33.38 kg/m  , BMI Body mass index is 33.38 kg/m.     GEN: Obese, in no  acute distress. HEENT: normal. Neck: Supple, no JVD, carotid bruits, or masses. Cardiac: RRR, no murmurs, rubs, or  gallops. No clubbing, cyanosis, edema.  Radials/PT 2+ and equal bilaterally.  Respiratory:  Respirations regular and unlabored, clear to auscultation bilaterally. GI: Obese, soft, nontender, nondistended, BS + x 4. MS: no deformity or atrophy. Skin: warm and dry, no rash. Neuro:  Strength and sensation are intact. Psych: Normal affect.  Accessory Clinical Findings    ECG personally reviewed by me today-regular sinus rhythm, 86, nonspecific T changes- No acute changes  ECG dated December 19, 2020 personally reviewed by me today: Atrial fibrillation, 88, septal infarct  Lab Results  Component Value Date   WBC 6.2 01/05/2021   HGB 14.0 01/05/2021   HCT 43.2 01/05/2021   MCV 87 01/05/2021   PLT 226 01/05/2021   Lab Results  Component Value Date   CREATININE 1.14 01/05/2021   BUN 14 01/05/2021   NA 141 01/05/2021   K 4.4 01/05/2021   CL 105 01/05/2021   CO2 21 01/05/2021   Lab Results  Component Value Date   ALT 15 01/05/2021   AST 17 01/05/2021   ALKPHOS 81 01/05/2021   BILITOT 0.4 01/05/2021   Lab Results  Component Value Date   CHOL 121 10/16/2020   HDL 46 10/16/2020   LDLCALC 60 10/16/2020   TRIG 72 10/16/2020   CHOLHDL 2.6 10/16/2020    Lab Results  Component Value Date   HGBA1C 8.0 (H) 10/16/2020    Assessment & Plan   1.  Paroxysmal atrial fibrillation: Patient recently presented for a routine colonoscopy in early August and was noted to be in atrial fibrillation at 88 bpm.  Patient says he was completely asymptomatic.  Procedure was canceled and he was advised to follow-up with cardiology.  In retrospect, he says he has been having intermittent palpitations sometimes associated with mild heaviness in his chest over the past several months, occurring both with activity and at rest, lasting just a few minutes to some time under an hour, and resolving spontaneously.  He has noted irregularity in his pulse during periods of palpitations when he is lying in bed.  He  was in sinus rhythm at primary care follow-up on August 19 and is in sinus rhythm today.  We discussed the pathophysiology of atrial fibrillation as well as indications for management.  Labs on August 22 were unremarkable.  I will place a 2-week Zio monitor to assess for burden of atrial fibrillation.  As he does have regular symptomatic palpitations, I am going to add metoprolol 25 mg twice daily.  To make blood pressure room for this, I will reduce his amlodipine to 5 mg daily.  He has a CHA2DS2-VASc of 2- 3 in the setting of history of hypertension, diabetes, and atherosclerotic calcifications of the thoracic aorta noted on CT scan in 2020.  He turns 66 next year.  We discussed the indication for oral anticoagulation and he was agreeable to begin Eliquis 5 mg twice daily.  Finally, I will arrange for 2D echocardiogram to reevaluate LV and valvular function, and atrial sizes.  He notes that he has been actively trying to lose more weight discussed the importance of this and helping to limit burden of atrial fibrillation.  Pending studies, will consider EP referral.  2.  Essential hypertension: This is well controlled on current regimen including losartan and amlodipine.  Given the symptomatic palpitations and finding of paroxysmal atrial fibrillation, I am going to add low-dose beta-blocker  therapy.  In order to make room for this, I have reduced his amlodipine to 5 mg daily.  3.  History of chest pain/heaviness: Patient with 2 prior nonischemic stress test between January 2016 and January 2017.  He has noted in the past several months that in the setting of elevated heart rates and palpitations, he will sometimes experience mild heaviness in his chest.  Says this is generally brief.  We discussed possible pursuit of repeat stress testing, and at this time we will defer and reevaluate following other tests, and symptoms on beta-blocker therapy.  4.  Hyperlipidemia: LDL of 60 on statin therapy with normal  LFTs in August.  5.  Obstructive sleep apnea: He reports compliance with CPAP.  6.  Morbid obesity: Has been try to be more active and watch his caloric intake.  Weight is down 6 pounds since earlier this summer.  7.  Type 2 diabetes mellitus: A1c 8.0 in June.  He is on metformin, Glucotrol, and dapagliflozin.  Followed by primary care.  8.  Disposition: Follow-up echo and Zio monitoring.  Follow-up in clinic in approximately 4 to 6 weeks or sooner if necessary.  Plan to follow-up CBC and basic metabolic panel at follow-up office visit given new Eliquis start.  Murray Hodgkins, NP 01/27/2021, 12:30 PM

## 2021-01-27 NOTE — Patient Instructions (Signed)
Medication Instructions:  Your physician has recommended you make the following change in your medication:   REDUCE Amlodipine to 5 mg daily.  START Metoprolol 25 mg twice a day. An Rx has been sent to your pharmacy.  START Eliquis 5 mg twice a day. An Rx has been sent to your pharmacy.  *If you need a refill on your cardiac medications before your next appointment, please call your pharmacy*   Lab Work: None ordered  If you have labs (blood work) drawn today and your tests are completely normal, you will receive your results only by: [place] (if you have MyChart) OR A paper copy in the mail If you have any lab test that is abnormal or we need to change your treatment, we will call you to review the results.   Testing/Procedures: Your physician has requested that you have an echocardiogram. Echocardiography is a painless test that uses sound waves to create images of your heart. It provides your doctor with information about the size and shape of your heart and how well your heart's chambers and valves are working. This procedure takes approximately one hour. There are no restrictions for this procedure.  Your physician has recommended that you wear a Zio monitor XT. (To be worn for 14 days)  This monitor is a medical device that records the heart's electrical activity. Doctors most often use these monitors to diagnose arrhythmias. Arrhythmias are problems with the speed or rhythm of the heartbeat. The monitor is a small device applied to your chest. You can wear one while you do your normal daily activities. While wearing this monitor if you have any symptoms to push the button and record what you felt. Once you have worn this monitor for the period of time provider prescribed (Usually 14 days), you will return the monitor device in the postage paid box. Once it is returned they will download the data collected and provide [place] with a report which the provider will then review and we  will call you with those results. Important tips:  Avoid showering during the first 24 hours of wearing the monitor. Avoid excessive sweating to help maximize wear time. Do not submerge the device, no hot tubs, and no swimming pools. Keep any lotions or oils away from the patch. After 24 hours you may shower with the patch on. Take brief showers with your back facing the shower head.  Do not remove patch once it has been placed because that will interrupt data and decrease adhesive wear time. Push the button when you have any symptoms and write down what you were feeling. Once you have completed wearing your monitor, remove and place into box which has postage paid and place in your outgoing mailbox.  If for some reason you have misplaced your box then call our office and we can provide another box and/or mail it off for you.      Follow-Up: At [facility], you and your health needs are our priority.  As part of our continuing mission to provide you with exceptional heart care, we have created designated Provider Care Teams.  These Care Teams include your primary Cardiologist (physician) and Advanced Practice Providers (APPs -  Physician Assistants and Nurse Practitioners) who all work together to provide you with the care you need, when you need it.  We recommend signing up for the patient portal called "MyChart".  Sign up information is provided on this After Visit Summary.  MyChart is used to connect with  patients for Virtual Visits (Telemedicine).  Patients are able to view lab/test results, encounter notes, upcoming appointments, etc.  Non-urgent messages can be sent to your provider as well.   To learn more about what you can do with MyChart, go to [email or web address].    Your next appointment:   6 week(s)  The format for your next appointment:   In Person  Provider:   You may see Kathlyn Sacramento, MD or one of the following Advanced Practice Providers on your designated Care  Team:   Murray Hodgkins, NP   Other Instructions N/A

## 2021-01-30 ENCOUNTER — Other Ambulatory Visit: Payer: No Typology Code available for payment source

## 2021-02-25 ENCOUNTER — Ambulatory Visit (INDEPENDENT_AMBULATORY_CARE_PROVIDER_SITE_OTHER): Payer: No Typology Code available for payment source

## 2021-02-25 ENCOUNTER — Other Ambulatory Visit: Payer: Self-pay

## 2021-02-25 DIAGNOSIS — I480 Paroxysmal atrial fibrillation: Secondary | ICD-10-CM

## 2021-02-25 LAB — ECHOCARDIOGRAM COMPLETE
AR max vel: 3.13 cm2
AV Area VTI: 3.1 cm2
AV Area mean vel: 3.25 cm2
AV Mean grad: 3 mmHg
AV Peak grad: 6 mmHg
Ao pk vel: 1.22 m/s
Area-P 1/2: 2.82 cm2
S' Lateral: 2.9 cm

## 2021-02-25 MED ORDER — PERFLUTREN LIPID MICROSPHERE
1.0000 mL | INTRAVENOUS | Status: AC | PRN
Start: 2021-02-25 — End: 2021-02-25
  Administered 2021-02-25: 2 mL via INTRAVENOUS

## 2021-02-26 ENCOUNTER — Other Ambulatory Visit: Payer: Self-pay | Admitting: Nurse Practitioner

## 2021-02-26 DIAGNOSIS — I4891 Unspecified atrial fibrillation: Secondary | ICD-10-CM

## 2021-02-27 NOTE — Telephone Encounter (Signed)
Refill request

## 2021-02-27 NOTE — Telephone Encounter (Signed)
Prescription refill request for Eliquis received. Indication: a fib Last office visit:  01/27/21 Scr: 1.14 Age:  65 Weight: 114kg

## 2021-03-02 ENCOUNTER — Encounter: Payer: Self-pay | Admitting: General Surgery

## 2021-03-13 ENCOUNTER — Encounter: Payer: Self-pay | Admitting: Nurse Practitioner

## 2021-03-13 ENCOUNTER — Ambulatory Visit: Payer: No Typology Code available for payment source | Admitting: Nurse Practitioner

## 2021-03-13 ENCOUNTER — Other Ambulatory Visit: Payer: Self-pay

## 2021-03-13 VITALS — BP 130/70 | HR 83 | Ht 73.0 in | Wt 258.0 lb

## 2021-03-13 DIAGNOSIS — E782 Mixed hyperlipidemia: Secondary | ICD-10-CM | POA: Diagnosis not present

## 2021-03-13 DIAGNOSIS — I10 Essential (primary) hypertension: Secondary | ICD-10-CM

## 2021-03-13 DIAGNOSIS — I4891 Unspecified atrial fibrillation: Secondary | ICD-10-CM | POA: Diagnosis not present

## 2021-03-13 DIAGNOSIS — G4733 Obstructive sleep apnea (adult) (pediatric): Secondary | ICD-10-CM

## 2021-03-13 DIAGNOSIS — I480 Paroxysmal atrial fibrillation: Secondary | ICD-10-CM

## 2021-03-13 NOTE — Progress Notes (Signed)
Office Visit    Patient Name: Marvin Duncan. Date of Encounter: 03/13/2021  Primary Care Provider:  Delsa Grana, PA-C Primary Cardiologist:  Kathlyn Sacramento, MD  Chief Complaint    65 year old male with a history of type 2 diabetes mellitus diagnosed in 2000, hypertension, hyperlipidemia, obesity, sleep apnea, and chest pain with prior normal stress testing (2016; 2017), who presents for follow-up related to recently diagnosed paroxysmal atrial fibrillation.  Past Medical History    Past Medical History:  Diagnosis Date   Adrenal tumor 07/26/2018   Aortic atherosclerosis ([place])    a. 07/2018 CT chest: Atherosclerotic calcifications of the thoracic aorta.   Chest pain    a. 05/2014 Ex MV: no evidence of significant ischemia, GI uptake was noted, EF 51%, no ECG changes concerning for ischemia, normal study; b. 05/2015 Ex MV: No ischemia/infarct. EF 38% (2/2 atten artifact-->EF 55-60% by echo 05/2015).   Decreased libido    Diastolic dysfunction    a. 05/2015 Echo: EF 55-60%, mild LVH. Mild MR. Mildly dil LA/RV; b. 02/2021 Echo: EF 55-60%, no rwma, mild-mod LVH, GrI DD, nl RV size/fxn. No signif valvular dzs.   HTN (hypertension)    Hyperlipidemia    IDDM (insulin dependent diabetes mellitus)    Obesity    PAF (paroxysmal atrial fibrillation) ([place])    a. Dx 12/2020. CHA2DS2VASc = 2-3 (HTN/DM/Ao atherosclerosis); b. 02/2021 Zio: RSR, rare PACs/PVCs. No Afib or other significant arrhythmias.   Pharyngitis    Sleep apnea    uses CPAP   Past Surgical History:  Procedure Laterality Date   AV FISTULA REPAIR     CARDIAC CATHETERIZATION     [facility]    COLONOSCOPY  2013   COLONOSCOPY WITH PROPOFOL N/A 12/19/2020   Procedure: COLONOSCOPY WITH PROPOFOL;  Surgeon: [name], MD;  Location: [facility] ENDOSCOPY;  Service: Gastroenterology;  Laterality: N/A;    Allergies  Allergies  Allergen Reactions   Atorvastatin Other (See Comments)    Statins cause headaches and muscle aches     History of Present Illness    65 year old male with the above past medical history including type 2 diabetes mellitus diagnosed in 2000, hypertension, hyperlipidemia, obesity, sleep apnea (uses CPAP nightly), and chest pain.  He was previously seen in January 2016 in the setting of chest pain with generalized cramps, prompting ED evaluation.  He underwent stress testing that showed no evidence of ischemia and an EF of 51%.  In December 2016, he again presented to the emergency department with chest pain and normal troponins.  He had nonspecific ST and T changes in inferolateral leads and was advised to follow-up with cardiology.  He was seen in the office in December 2016 and underwent repeat stress testing in January 2017, which was again low risk without evidence of ischemia or infarct.  Echocardiogram was also carried out and showed an EF of 55 to 60% with mild LVH and mild mitral regurgitation.  He was subsequently lost to follow-up.  Throughout mid-2022, he noted occasional elevations in heart rate with activity associated with mild chest heaviness, which seem to resolve with rest.  He also noted intermittent palpitations, especially occurring at night while lying down and resting.  On August 5, he presented for routine colonoscopy and was noted to be in atrial fibrillation at 88 bpm.  He was completely asymptomatic.  His procedure was canceled and he was advised to follow-up primary care and cardiology.  I saw him on September 13, at which time  he was maintaining sinus rhythm, as he was when seen by primary care August 19.  With a CHA2DS2-VASc of 2-3 in the setting of hypertension, diabetes, and atherosclerotic calcifications of the thoracic aorta noted on CT scan in 2020, we started Eliquis 5 mg twice daily.  Echocardiogram was performed on October 12 showing an EF of 55 to 60% with mild to moderate LVH, grade 1 diastolic dysfunction, and no regional wall motion abnormalities.  The RV was of normal  size with normal function.  No significant valvular disease was noted.  Zio monitoring showed sinus rhythm with rare PACs and PVCs.  No A. fib was noted.  Since his last visit, he has done well.  He is very active at work and denies chest pain or dyspnea.  He did have 1 brief episode of palpitations.  He feels that ever since starting metoprolol, his overall burden of palpitations has improved significantly.  We discussed test results as outlined above in the importance of lifestyle modifications and weight loss and helping to manage risk factors and atrial fibrillation.  He notes that he has been trying to lose weight and is actually down 22 pounds in the past 5 years.  He denies PND, orthopnea, dizziness, syncope, edema, or early satiety  Home Medications    Current Outpatient Medications  Medication Sig Dispense Refill   amLODipine (NORVASC) 5 MG tablet Take 1 tablet (5 mg total) by mouth daily. 30 tablet 2   apixaban (ELIQUIS) 5 MG TABS tablet TAKE 1 TABLET(5 MG) BY MOUTH TWICE DAILY 90 tablet 1   Cholecalciferol (VITAMIN D) 2000 UNITS tablet Take 2,000 Units by mouth daily.     Dulaglutide (TRULICITY) 1.5 DI/2.6EB SOPN Inject contents of one syringe once a week 6 mL 3   empagliflozin (JARDIANCE) 25 MG TABS tablet Take 1 tablet (25 mg total) by mouth daily. 90 tablet 1   Flaxseed, Linseed, (FLAXSEED OIL) 1000 MG CAPS Take 1,000 mg by mouth daily.      fluticasone (FLONASE) 50 MCG/ACT nasal spray USE 2 SPRAYS IN EACH  NOSTRIL DAILY (IF NEEDED  FOR ALLERGY SYMPTOMS) 48 g 3   glipiZIDE (GLUCOTROL) 10 MG tablet TAKE 1 TABLET BY MOUTH TWICE DAILY BEFORE A MEAL FOR HIGH SUGARS (hold if blood sugar <100 or not eating) 180 tablet 0   losartan (COZAAR) 100 MG tablet Take 1 tablet (100 mg total) by mouth daily. 90 tablet 3   metFORMIN (GLUCOPHAGE) 1000 MG tablet Take 1 tablet (1,000 mg total) by mouth 2 (two) times daily with a meal. 180 tablet 3   metoprolol tartrate (LOPRESSOR) 25 MG tablet Take 1  tablet (25 mg total) by mouth 2 (two) times daily. 60 tablet 2   Na Sulfate-K Sulfate-Mg Sulf (SUPREP BOWEL PREP KIT) 17.5-3.13-1.6 GM/177ML SOLN Take 1 kit by mouth as directed. 354 mL 0   Omega-3 Fatty Acids (FISH OIL) 1000 MG CAPS Take 1,000 mg by mouth daily.     rosuvastatin (CRESTOR) 20 MG tablet Take 1 tablet (20 mg total) by mouth daily. 90 tablet 3   traZODone (DESYREL) 100 MG tablet Take 1 tablet (100 mg total) by mouth at bedtime. 90 tablet 3   valACYclovir (VALTREX) 1000 MG tablet Take 1 tablet (1,000 mg total) by mouth 3 (three) times daily. 21 tablet 0   vitamin B-12 (CYANOCOBALAMIN) 1000 MCG tablet Take 1,000 mcg by mouth daily.     No current facility-administered medications for this visit.     Review of Systems  1 brief episode of palpitations since his last visit.  He denies chest pain, dyspnea, PND, orthopnea, dizziness, syncope, edema, or early satiety.  All other systems reviewed and are otherwise negative except as noted above.  Physical Exam    VS:  BP 130/70   Pulse 83   Ht $R'6\' 1"'hJ$  (1.854 m)   Wt 258 lb (117 kg)   SpO2 96%   BMI 34.04 kg/m  , BMI Body mass index is 34.04 kg/m.     GEN: Well nourished, well developed, in no acute distress. HEENT: normal. Neck: Supple, no JVD, carotid bruits, or masses. Cardiac: RRR, no murmurs, rubs, or gallops. No clubbing, cyanosis, edema.  Radials/PT 2+ and equal bilaterally.  Respiratory:  Respirations regular and unlabored, clear to auscultation bilaterally. GI: Soft, nontender, nondistended, BS + x 4. MS: no deformity or atrophy. Skin: warm and dry, no rash. Neuro:  Strength and sensation are intact. Psych: Normal affect.  Accessory Clinical Findings    ECG personally reviewed by me today -regular sinus rhythm, 83, inferolateral T abnormalities-slightly more pronounced than on prior ECGs.  Lab Results  Component Value Date   WBC 6.2 01/05/2021   HGB 14.0 01/05/2021   HCT 43.2 01/05/2021   MCV 87 01/05/2021    PLT 226 01/05/2021   Lab Results  Component Value Date   CREATININE 1.14 01/05/2021   BUN 14 01/05/2021   NA 141 01/05/2021   K 4.4 01/05/2021   CL 105 01/05/2021   CO2 21 01/05/2021   Lab Results  Component Value Date   ALT 15 01/05/2021   AST 17 01/05/2021   ALKPHOS 81 01/05/2021   BILITOT 0.4 01/05/2021   Lab Results  Component Value Date   CHOL 121 10/16/2020   HDL 46 10/16/2020   LDLCALC 60 10/16/2020   TRIG 72 10/16/2020   CHOLHDL 2.6 10/16/2020    Lab Results  Component Value Date   HGBA1C 8.0 (H) 10/16/2020    Assessment & Plan    1.  Paroxysmal atrial fibrillation: Patient with a history of tachypalpitations and elevated heart rates was found to be in atrial fibrillation when he presented for routine colonoscopy in August.  He has been managed with metoprolol and Eliquis 5 mg twice daily and since his visit 1 month ago, has only had 1 brief episode of palpitations.  Zio monitoring at the end of September did not reveal any atrial fibrillation, with only rare PACs and PVCs.  Echocardiogram earlier this month with an EF of 55 to 60% and grade 1 diastolic dysfunction.  He is in sinus rhythm today.  Continue beta-blocker and Eliquis therapy.  Follow-up CBC and basic metabolic panel as it has been a month since initiation.  He remains compliant with CPAP.  We discussed possibly referring to electrophysiology however he would like to defer at this time given minimal symptoms and reassuring Zio findings.  We had a discussion about lifestyle modifications and the importance of weight loss in the management of atrial fibrillation.  2.  Essential hypertension: Stable on beta-blocker, ARB, and amlodipine.  3.  History of chest pain/heaviness: Prior normal stress test in 2016 and 2017.  Recent echo with normal LV function.  ECG today was slightly more pronounced inferolateral ST/T changes though review of older ECG shows similar slightly more subtle findings.  He has not had any  recurrent chest pain or dyspnea despite being very active at work.  He is not interested in pursuing ischemic testing at this time.  As previously noted, he may discontinue aspirin given ongoing Eliquis therapy.  4.  Hyperlipidemia: LDL of 60 on statin therapy with normal LFTs in August.  5.  Obstructive sleep apnea: Reports compliance with CPAP.  6.  Morbid obesity: Discussed the importance of weight loss in the management of atrial fibrillation.  He notes that he is trying.  I encouraged regular exercise outside of his activities at work and caloric restriction.  7.  Type 2 diabetes mellitus: A1c 8.0 in June.  He is on metformin, Glucotrol, and dapagliflozin.  Followed by primary care.  8.  Disposition: Follow-up CBC and basic metabolic panel today.  Follow-up in clinic in 3 months or sooner if necessary.   Murray Hodgkins, NP 03/13/2021, 11:08 AM

## 2021-03-13 NOTE — Patient Instructions (Addendum)
Medication Instructions:  No changes at this time  *If you need a refill on your cardiac medications before your next appointment, please call your pharmacy*   Lab Work: CBC, BMET today   If you have labs (blood work) drawn today and your tests are completely normal, you will receive your results only by: [place] (if you have MyChart) OR A paper copy in the mail If you have any lab test that is abnormal or we need to change your treatment, we will call you to review the results.   Testing/Procedures: None   Follow-Up: At [facility], you and your health needs are our priority.  As part of our continuing mission to provide you with exceptional heart care, we have created designated Provider Care Teams.  These Care Teams include your primary Cardiologist (physician) and Advanced Practice Providers (APPs -  Physician Assistants and Nurse Practitioners) who all work together to provide you with the care you need, when you need it.   Your next appointment:   3-4 month(s)  The format for your next appointment:   In Person  Provider:   Kathlyn Sacramento, MD

## 2021-03-13 NOTE — Progress Notes (Signed)
Gerald Stabs - your notes are so good - thanks for being awesome! -Kristeen Miss

## 2021-03-14 LAB — BASIC METABOLIC PANEL
BUN/Creatinine Ratio: 11 (ref 10–24)
BUN: 15 mg/dL (ref 8–27)
CO2: 19 mmol/L — ABNORMAL LOW (ref 20–29)
Calcium: 9.3 mg/dL (ref 8.6–10.2)
Chloride: 102 mmol/L (ref 96–106)
Creatinine, Ser: 1.32 mg/dL — ABNORMAL HIGH (ref 0.76–1.27)
Glucose: 373 mg/dL — ABNORMAL HIGH (ref 70–99)
Potassium: 4.6 mmol/L (ref 3.5–5.2)
Sodium: 137 mmol/L (ref 134–144)
eGFR: 60 mL/min/{1.73_m2} (ref >59)

## 2021-03-14 LAB — CBC
Hematocrit: 44.3 % (ref 37.5–51.0)
Hemoglobin: 14.3 g/dL (ref 13.0–17.7)
MCH: 28.1 pg (ref 26.6–33.0)
MCHC: 32.3 g/dL (ref 31.5–35.7)
MCV: 87 fL (ref 79–97)
Platelets: 242 10*3/uL (ref 150–450)
RBC: 5.09 x10E6/uL (ref 4.14–5.80)
RDW: 10.7 % — ABNORMAL LOW (ref 11.6–15.4)
WBC: 6.3 10*3/uL (ref 3.4–10.8)

## 2021-03-15 ENCOUNTER — Other Ambulatory Visit: Payer: Self-pay | Admitting: Family Medicine

## 2021-03-15 NOTE — Telephone Encounter (Signed)
last RF 10/16/20 #90 1 RF

## 2021-03-17 NOTE — Addendum Note (Signed)
Addended by: Janan Ridge on: 03/17/2021 01:43 PM   Modules accepted: Orders

## 2021-03-31 ENCOUNTER — Other Ambulatory Visit: Payer: Self-pay | Admitting: Nurse Practitioner

## 2021-03-31 DIAGNOSIS — E782 Mixed hyperlipidemia: Secondary | ICD-10-CM

## 2021-03-31 DIAGNOSIS — I10 Essential (primary) hypertension: Secondary | ICD-10-CM

## 2021-03-31 NOTE — Telephone Encounter (Signed)
This is a [place] pt 

## 2021-04-17 ENCOUNTER — Encounter: Payer: Self-pay | Admitting: Internal Medicine

## 2021-04-17 ENCOUNTER — Ambulatory Visit: Payer: No Typology Code available for payment source | Admitting: Internal Medicine

## 2021-04-17 VITALS — BP 132/70 | HR 70 | Temp 97.6°F | Resp 16 | Ht 73.0 in | Wt 257.8 lb

## 2021-04-17 DIAGNOSIS — I10 Essential (primary) hypertension: Secondary | ICD-10-CM

## 2021-04-17 DIAGNOSIS — I480 Paroxysmal atrial fibrillation: Secondary | ICD-10-CM

## 2021-04-17 DIAGNOSIS — G4733 Obstructive sleep apnea (adult) (pediatric): Secondary | ICD-10-CM

## 2021-04-17 DIAGNOSIS — K219 Gastro-esophageal reflux disease without esophagitis: Secondary | ICD-10-CM

## 2021-04-17 DIAGNOSIS — E782 Mixed hyperlipidemia: Secondary | ICD-10-CM

## 2021-04-17 DIAGNOSIS — J309 Allergic rhinitis, unspecified: Secondary | ICD-10-CM

## 2021-04-17 DIAGNOSIS — E1165 Type 2 diabetes mellitus with hyperglycemia: Secondary | ICD-10-CM

## 2021-04-17 DIAGNOSIS — Z9989 Dependence on other enabling machines and devices: Secondary | ICD-10-CM

## 2021-04-17 LAB — POCT GLYCOSYLATED HEMOGLOBIN (HGB A1C): Hemoglobin A1C: 9 % — AB (ref 4.0–5.6)

## 2021-04-17 MED ORDER — FLUTICASONE PROPIONATE 50 MCG/ACT NA SUSP: NASAL | 3 refills | Status: DC

## 2021-04-17 NOTE — Progress Notes (Signed)
Established Patient Office Visit  Subjective:  Patient ID: Marvin Massie., male    DOB: November 07, 1955  Age: 65 y.o. MRN: [id number]  CC:  Chief Complaint  Patient presents with   Follow-up   Diabetes   Hypertension   Hyperlipidemia    HPI Marvin SCHETTER Sr. presents for follow up on chronic medical conditions.   Diabetes, Type 2: -Last A1c 8.0% 6/22 -Medications: Jardiance 25, Glipizide 10, Metformin 1000 twice daily, Trulicity 1.5 weekly on Saturday  -Patient is compliant with the above medications and reports no side effects.  -Checking BG at home: doesn't check - has polyuria when sugars high -Diet: drinking a lot of sugary beverages right now over the last several months -Foot exam: Due today -Microalbumin: Due today -Statin: yes -PNA vaccine: Prevnar 23 in 2008, discuss Prevnar 20 at FU -Denies symptoms of hypoglycemia, polydipsia, numbness extremities, foot ulcers/trauma. Does endorse polyuria when sugars high.   A.Fib: -Currently on Lopressor 25, Eliquis 5 mg BID -Compliant with above medications and denies adverse effects, denies abnormal bleeding.  -Following with Cardiology, last saw in October 2022   Hypertension/OSA: -Medications: Amlodipine 5, Losartan 100, Lopressor 25 -Patient is compliant with above medications and reports no side effects. -Checking BP at home (average): not checking  -Denies any SOB, CP, vision changes, LE edema or symptoms of hypotension -Compliant with CPAP  HLD: -Medications: Crestor 20 mg -Patient is compliant with above medications and reports no side effects.  -Last lipid panel: 6/22: TC 121, HDL 46, Triglycerides 72, LDL 60  GERD: -Currently on Pepcid  -Symptoms well controlled.   Seasonal Allergies/PND: -Using Flonase   Health Maintenance:  -Blood work up to date -Colon cancer screening: was diagnosed with A.fib when trying to do colonoscopy, due for screening now that A.fib is controlled, will discuss at next visit.    Past Medical History:  Diagnosis Date   Adrenal tumor 07/26/2018   Aortic atherosclerosis ([place])    a. 07/2018 CT chest: Atherosclerotic calcifications of the thoracic aorta.   Chest pain    a. 05/2014 Ex MV: no evidence of significant ischemia, GI uptake was noted, EF 51%, no ECG changes concerning for ischemia, normal study; b. 05/2015 Ex MV: No ischemia/infarct. EF 38% (2/2 atten artifact-->EF 55-60% by echo 05/2015).   Decreased libido    Diastolic dysfunction    a. 05/2015 Echo: EF 55-60%, mild LVH. Mild MR. Mildly dil LA/RV; b. 02/2021 Echo: EF 55-60%, no rwma, mild-mod LVH, GrI DD, nl RV size/fxn. No signif valvular dzs.   HTN (hypertension)    Hyperlipidemia    IDDM (insulin dependent diabetes mellitus)    Obesity    PAF (paroxysmal atrial fibrillation) ([place])    a. Dx 12/2020. CHA2DS2VASc = 2-3 (HTN/DM/Ao atherosclerosis); b. 02/2021 Zio: RSR, rare PACs/PVCs. No Afib or other significant arrhythmias.   Pharyngitis    Sleep apnea    uses CPAP    Past Surgical History:  Procedure Laterality Date   AV FISTULA REPAIR     CARDIAC CATHETERIZATION     [facility]    COLONOSCOPY  2013   COLONOSCOPY WITH PROPOFOL N/A 12/19/2020   Procedure: COLONOSCOPY WITH PROPOFOL;  Surgeon: [name], MD;  Location: [facility] ENDOSCOPY;  Service: Gastroenterology;  Laterality: N/A;    Family History  Problem Relation Age of Onset   Breast cancer Mother    Cancer Mother    Kidney failure Father    Diabetes Father    Alcohol abuse Father  Diabetes Sister    Other Daughter     Social History   Socioeconomic History   Marital status: Married    Spouse name: Glenda   Number of children: 3   Years of education: Not on file   Highest education level: Some college, no degree  Occupational History   Not on file  Tobacco Use   Smoking status: Former    Packs/day: 0.25    Years: 25.00    Pack years: 6.25    Types: Cigarettes    Quit date: 05/17/1998    Years since quitting: 22.9   Smokeless  tobacco: Never   Tobacco comments:    off and on - maybe 5 cigarettes  Vaping Use   Vaping Use: Never used  Substance and Sexual Activity   Alcohol use: No    Alcohol/week: 0.0 standard drinks    Comment: previously but not currently   Drug use: No   Sexual activity: Yes    Partners: Female  Other Topics Concern   Not on file  Social History Narrative   Not on file   Social Determinants of Health   Financial Resource Strain: Low Risk    Difficulty of Paying Living Expenses: Not hard at all  Food Insecurity: No Food Insecurity   Worried About Charity fundraiser in the Last Year: Never true   [place] in the Last Year: Never true  Transportation Needs: No Transportation Needs   Lack of Transportation (Medical): No   Lack of Transportation (Non-Medical): No  Physical Activity: Insufficiently Active   Days of Exercise per Week: 2 days   Minutes of Exercise per Session: 30 min  Stress: No Stress Concern Present   Feeling of Stress : Only a little  Social Connections: Engineer, building services of Communication with Friends and Family: Twice a week   Frequency of Social Gatherings with Friends and Family: Twice a week   Attends Religious Services: More than 4 times per year   Active Member of [employer] or Organizations: Yes   Attends Music therapist: More than 4 times per year   Marital Status: Married  Human resources officer Violence: Not At Risk   Fear of Current or Ex-Partner: No   Emotionally Abused: No   Physically Abused: No   Sexually Abused: No    Outpatient Medications Prior to Visit  Medication Sig Dispense Refill   amLODipine (NORVASC) 5 MG tablet TAKE 1 TABLET(5 MG) BY MOUTH DAILY 30 tablet 2   apixaban (ELIQUIS) 5 MG TABS tablet TAKE 1 TABLET(5 MG) BY MOUTH TWICE DAILY 90 tablet 1   Cholecalciferol (VITAMIN D) 2000 UNITS tablet Take 2,000 Units by mouth daily.     Dulaglutide (TRULICITY) 1.5 GX/2.1JH SOPN Inject contents of one syringe once a  week 6 mL 3   empagliflozin (JARDIANCE) 25 MG TABS tablet Take 1 tablet (25 mg total) by mouth daily. 90 tablet 1   Flaxseed, Linseed, (FLAXSEED OIL) 1000 MG CAPS Take 1,000 mg by mouth daily.      fluticasone (FLONASE) 50 MCG/ACT nasal spray USE 2 SPRAYS IN EACH  NOSTRIL DAILY (IF NEEDED  FOR ALLERGY SYMPTOMS) 48 g 3   glipiZIDE (GLUCOTROL) 10 MG tablet TAKE 1 TABLET BY MOUTH TWICE DAILY BEFORE A MEAL FOR HIGH SUGARS (hold if blood sugar <100 or not eating) 180 tablet 0   losartan (COZAAR) 100 MG tablet Take 1 tablet (100 mg total) by mouth daily. 90 tablet 3  metFORMIN (GLUCOPHAGE) 1000 MG tablet Take 1 tablet (1,000 mg total) by mouth 2 (two) times daily with a meal. 180 tablet 3   metoprolol tartrate (LOPRESSOR) 25 MG tablet TAKE 1 TABLET(25 MG) BY MOUTH TWICE DAILY 60 tablet 2   Na Sulfate-K Sulfate-Mg Sulf (SUPREP BOWEL PREP KIT) 17.5-3.13-1.6 GM/177ML SOLN Take 1 kit by mouth as directed. 354 mL 0   Omega-3 Fatty Acids (FISH OIL) 1000 MG CAPS Take 1,000 mg by mouth daily.     rosuvastatin (CRESTOR) 20 MG tablet Take 1 tablet (20 mg total) by mouth daily. 90 tablet 3   traZODone (DESYREL) 100 MG tablet Take 1 tablet (100 mg total) by mouth at bedtime. 90 tablet 3   valACYclovir (VALTREX) 1000 MG tablet Take 1 tablet (1,000 mg total) by mouth 3 (three) times daily. 21 tablet 0   vitamin B-12 (CYANOCOBALAMIN) 1000 MCG tablet Take 1,000 mcg by mouth daily.     No facility-administered medications prior to visit.    Allergies  Allergen Reactions   Atorvastatin Other (See Comments)    Statins cause headaches and muscle aches    ROS Review of Systems  Constitutional:  Negative for chills and fever.  HENT:  Positive for postnasal drip.   Eyes:  Negative for visual disturbance.  Respiratory:  Negative for cough and shortness of breath.   Cardiovascular:  Negative for chest pain, palpitations and leg swelling.  Gastrointestinal:  Negative for abdominal pain, nausea and vomiting.   Endocrine: Positive for polyuria.  Neurological:  Negative for dizziness and headaches.     Objective:    Physical Exam Constitutional:      Appearance: Normal appearance.  HENT:     Head: Normocephalic and atraumatic.  Eyes:     Conjunctiva/sclera: Conjunctivae normal.  Cardiovascular:     Rate and Rhythm: Normal rate and regular rhythm.     Pulses:          Dorsalis pedis pulses are 2+ on the right side and 2+ on the left side.  Pulmonary:     Effort: Pulmonary effort is normal.     Breath sounds: Normal breath sounds.  Musculoskeletal:     Right lower leg: No edema.     Left lower leg: No edema.     Right foot: Normal range of motion. No deformity, bunion, Charcot foot, foot drop or prominent metatarsal heads.     Left foot: Normal range of motion. No deformity, bunion, Charcot foot, foot drop or prominent metatarsal heads.  Feet:     Right foot:     Protective Sensation: 8 sites tested.  8 sites sensed.     Skin integrity: Skin integrity normal.     Toenail Condition: Right toenails are abnormally thick.     Left foot:     Protective Sensation: 8 sites tested.  8 sites sensed.     Skin integrity: Skin integrity normal.     Toenail Condition: Left toenails are abnormally thick.  Skin:    General: Skin is warm and dry.  Neurological:     General: No focal deficit present.     Mental Status: He is alert. Mental status is at baseline.  Psychiatric:        Behavior: Behavior normal.    BP 132/70   Pulse 70   Temp 97.6 F (36.4 C)   Resp 16   Ht _0  (1.854 m)   Wt 257 lb 12.8 oz (116.9 kg)   SpO2 97%   BMI  34.01 kg/m  Wt Readings from Last 3 Encounters:  03/13/21 258 lb (117 kg)  01/27/21 253 lb (114.8 kg)  01/02/21 252 lb 4.8 oz (114.4 kg)     Health Maintenance Due  Topic Date Due   COVID-19 Vaccine (1) Never done   Zoster Vaccines- Shingrix (1 of 2) Never done   Pneumococcal Vaccine 60-20 Years old (2 - PPSV23 if available, else PCV20) 08/30/2014    INFLUENZA VACCINE  12/15/2020   FOOT EXAM  04/04/2021   HEMOGLOBIN A1C  04/17/2021    There are no preventive care reminders to display for this patient.  Lab Results  Component Value Date   TSH 1.920 01/05/2021   Lab Results  Component Value Date   WBC 6.3 03/13/2021   HGB 14.3 03/13/2021   HCT 44.3 03/13/2021   MCV 87 03/13/2021   PLT 242 03/13/2021   Lab Results  Component Value Date   NA 137 03/13/2021   K 4.6 03/13/2021   CO2 19 (L) 03/13/2021   GLUCOSE 373 (H) 03/13/2021   BUN 15 03/13/2021   CREATININE 1.32 (H) 03/13/2021   BILITOT 0.4 01/05/2021   ALKPHOS 81 01/05/2021   AST 17 01/05/2021   ALT 15 01/05/2021   PROT 7.0 01/05/2021   ALBUMIN 4.3 01/05/2021   CALCIUM 9.3 03/13/2021   ANIONGAP 10 04/29/2015   EGFR 60 03/13/2021   Lab Results  Component Value Date   CHOL 121 10/16/2020   Lab Results  Component Value Date   HDL 46 10/16/2020   Lab Results  Component Value Date   LDLCALC 60 10/16/2020   Lab Results  Component Value Date   TRIG 72 10/16/2020   Lab Results  Component Value Date   CHOLHDL 2.6 10/16/2020   Lab Results  Component Value Date   HGBA1C 8.0 (H) 10/16/2020      Assessment & Plan:   1. Uncontrolled type 2 diabetes mellitus with hyperglycemia ([place]): A1c in the office elevated to 9.0%.  He states he is compliant with all of his medications.  He states this increases due to poor dietary habits and increasing sugary beverages.  We discussed increasing the Trulicity to 3.0 mg a week however the patient would like a chance to modify his lifestyle and recheck in 3 months.  If his A1c continues to be above goal visit, Trulicity will be increased and we will discuss starting insulin.  Microalbumin/creatinine ratio obtained today.  - POCT HgB A1C - Urine Microalbumin w/creat. ratio  2. Paroxysmal atrial fibrillation ([place]): Stable.  Compliant with beta-blocker and Eliquis.  Continuing to follow with cardiology.  3. Benign  essential HTN: Stable.  No changes in medications made today.  4. OSA on CPAP: Stable.  Continue CPAP.  5. Mixed hyperlipidemia: Stable.  Discussed last panel results.  Recheck in 6 months.  6. Gastroesophageal reflux disease, unspecified whether esophagitis present: Stable.  Currently taking over-the-counter Pepcid with relief of symptoms.  7. Allergic rhinitis, unspecified seasonality, unspecified trigger: Flonase refilled today.  Discussed nasal saline gel to help with nostril dryness at night due to CPAP.  - fluticasone (FLONASE) 50 MCG/ACT nasal spray; USE 2 SPRAYS IN EACH  NOSTRIL DAILY (IF NEEDED  FOR ALLERGY SYMPTOMS)  Dispense: 48 g; Refill: 3   Follow-up: Return in about 3 months (around 07/16/2021).    [name], DO

## 2021-04-17 NOTE — Patient Instructions (Addendum)
It was great seeing you today!  Plan discussed at today's visit: -Can use nasal saline gel (Nasogel) and Flonase at night to help with dryness from the CPAP -For diabetes, you have to make lifestyle changes, including stopping all sugar beverages and foods. Stay well hydrated and try to increase cardiovascular exercise. Continue all medications, I do recommend going up on Trulicity, please call if you change your mind on this.  -Ask Cardiologist about having another colonoscopy done now that A.fib is controlled.  -Follow up in 3 months to recheck A1c, insulin will be our next step.   Follow up in: 3 months   Take care and let us know if you have any questions or concerns prior to your next visit.  Dr. Rosana Berger

## 2021-04-18 LAB — MICROALBUMIN / CREATININE URINE RATIO
Creatinine, Urine: 62.4 mg/dL
Microalb/Creat Ratio: 24 mg/g creat (ref 0–29)
Microalbumin, Urine: 15.2 ug/mL

## 2021-05-13 ENCOUNTER — Other Ambulatory Visit: Payer: Self-pay | Admitting: Family Medicine

## 2021-05-13 DIAGNOSIS — K760 Fatty (change of) liver, not elsewhere classified: Secondary | ICD-10-CM

## 2021-05-20 ENCOUNTER — Other Ambulatory Visit: Payer: Self-pay | Admitting: Unknown Physician Specialty

## 2021-05-20 NOTE — Telephone Encounter (Signed)
Requested Prescriptions  Pending Prescriptions Disp Refills   glipiZIDE (GLUCOTROL) 10 MG tablet [Pharmacy Med Name: GLIPIZIDE 10MG  TABLETS] 180 tablet 0    Sig: TAKE 1 TABLET BY MOUTH TWICE DAILY BEFORE A MEAL FOR HIGH SUGARS     Endocrinology:  Diabetes - Sulfonylureas Failed - 05/20/2021  3:36 AM      Failed - HBA1C is between 0 and 7.9 and within 180 days    Hemoglobin A1C  Date Value Ref Range Status  04/17/2021 9.0 (A) 4.0 - 5.6 % Final   HbA1c, POC (controlled diabetic range)  Date Value Ref Range Status  03/01/2019 6.5 0.0 - 7.0 % Final   Hgb A1c MFr Bld  Date Value Ref Range Status  10/16/2020 8.0 (H) 4.8 - 5.6 % Final    Comment:             Prediabetes: 5.7 - 6.4          Diabetes: >6.4          Glycemic control for adults with diabetes: <7.0          Passed - Valid encounter within last 6 months    Recent Outpatient Visits          1 month ago Uncontrolled type 2 diabetes mellitus with hyperglycemia [facility])   [place], DO   4 months ago Atrial fibrillation, new onset [facility])   [facility] Delsa Grana, PA-C   7 months ago Colon cancer screening   [facility] [place], Malachy Mood, NP   1 year ago Uncontrolled type 2 diabetes mellitus with hyperglycemia [facility])   [facility] Delsa Grana, PA-C   1 year ago Benign essential HTN   [facility] Delsa Grana, PA-C      Future Appointments            In 1 month Arida, Mertie Clause, MD [facility] [place]   In 1 month [name], [facility], [place]

## 2021-05-28 ENCOUNTER — Other Ambulatory Visit: Payer: Self-pay | Admitting: Internal Medicine

## 2021-05-28 DIAGNOSIS — K760 Fatty (change of) liver, not elsewhere classified: Secondary | ICD-10-CM

## 2021-05-28 NOTE — Telephone Encounter (Signed)
[facility] called and spoke to [place], [facility] about the refill(s) Trulicity requested.  Advised it was sent on 05/13/21 #6 mL/3 refill(s). She states rx was received and checking the status if in stock. She states it is in stock and will go ahead and get this ready for pt. Pt is signed up for text alerts for when rx is ready for p/up. Will refuse this rx request.    Requested Prescriptions  Pending Prescriptions Disp Refills   Dulaglutide (TRULICITY) 1.5 DX/4.1OI SOPN 6 mL 3     Endocrinology:  Diabetes - GLP-1 Receptor Agonists Failed - 05/28/2021  2:04 PM      Failed - HBA1C is between 0 and 7.9 and within 180 days    Hemoglobin A1C  Date Value Ref Range Status  04/17/2021 9.0 (A) 4.0 - 5.6 % Final   HbA1c, POC (controlled diabetic range)  Date Value Ref Range Status  03/01/2019 6.5 0.0 - 7.0 % Final   Hgb A1c MFr Bld  Date Value Ref Range Status  10/16/2020 8.0 (H) 4.8 - 5.6 % Final    Comment:             Prediabetes: 5.7 - 6.4          Diabetes: >6.4          Glycemic control for adults with diabetes: <7.0           Passed - Valid encounter within last 6 months    Recent Outpatient Visits           1 month ago Uncontrolled type 2 diabetes mellitus with hyperglycemia [facility])   [place], DO   4 months ago Atrial fibrillation, new onset [facility])   [facility] Delsa Grana, PA-C   7 months ago Colon cancer screening   [facility] [place], Malachy Mood, NP   1 year ago Uncontrolled type 2 diabetes mellitus with hyperglycemia [facility])   [facility] Delsa Grana, PA-C   1 year ago Benign essential HTN   [facility] Delsa Grana, PA-C       Future Appointments             In 1 month Arida, Mertie Clause, MD [facility], [place]   In 1 month [name], [facility], [place]

## 2021-05-28 NOTE — Telephone Encounter (Signed)
Copied from [place] [id number]. Topic: Quick Communication - Rx Refill/Question >> May 28, 2021 12:51 PM Tessa Lerner A wrote: Medication: TRULICITY 1.5 AO/1.3YQ [facility] [[id number]]   Has the patient contacted their pharmacy? Yes.  The patient was directed to contact their PCP (Agent: If no, request that the patient contact the pharmacy for the refill. If patient does not wish to contact the pharmacy document the reason why and proceed with request.) (Agent: If yes, when and what did the pharmacy advise?)  Preferred Pharmacy (with phone number or street name): [id number] [name], [place] [address] [place] [address] Phone: [phone] Fax: [phone]   Has the patient been seen for an appointment in the last year OR does the patient have an upcoming appointment? Yes.    Agent: Please be advised that RX refills may take up to 3 business days. We ask that you follow-up with your pharmacy.

## 2021-06-11 ENCOUNTER — Other Ambulatory Visit: Payer: Self-pay

## 2021-06-11 DIAGNOSIS — Z1211 Encounter for screening for malignant neoplasm of colon: Secondary | ICD-10-CM

## 2021-07-07 ENCOUNTER — Ambulatory Visit: Payer: No Typology Code available for payment source | Admitting: Cardiovascular Disease

## 2021-07-16 ENCOUNTER — Ambulatory Visit: Payer: No Typology Code available for payment source | Admitting: Internal Medicine

## 2021-07-21 ENCOUNTER — Ambulatory Visit
Admission: RE | Admit: 2021-07-21 | Discharge: 2021-07-21 | Disposition: A | Discharge status: Home / Self Care | Payer: No Typology Code available for payment source | Attending: Internal Medicine | Admitting: Internal Medicine

## 2021-07-21 ENCOUNTER — Ambulatory Visit: Payer: No Typology Code available for payment source | Admitting: Internal Medicine

## 2021-07-21 ENCOUNTER — Other Ambulatory Visit: Payer: Self-pay

## 2021-07-21 ENCOUNTER — Encounter: Payer: Self-pay | Admitting: Internal Medicine

## 2021-07-21 ENCOUNTER — Ambulatory Visit
Admission: RE | Admit: 2021-07-21 | Discharge: 2021-07-21 | Disposition: A | Discharge status: Home / Self Care | Payer: No Typology Code available for payment source | Source: Ambulatory Visit | Attending: Internal Medicine | Admitting: Internal Medicine

## 2021-07-21 VITALS — BP 108/60 | HR 83 | Temp 97.7°F | Resp 16 | Wt 260.1 lb

## 2021-07-21 DIAGNOSIS — G8929 Other chronic pain: Secondary | ICD-10-CM | POA: Diagnosis present

## 2021-07-21 DIAGNOSIS — I10 Essential (primary) hypertension: Secondary | ICD-10-CM | POA: Diagnosis not present

## 2021-07-21 DIAGNOSIS — G4739 Other sleep apnea: Secondary | ICD-10-CM | POA: Diagnosis not present

## 2021-07-21 DIAGNOSIS — K219 Gastro-esophageal reflux disease without esophagitis: Secondary | ICD-10-CM

## 2021-07-21 DIAGNOSIS — E782 Mixed hyperlipidemia: Secondary | ICD-10-CM

## 2021-07-21 DIAGNOSIS — M25511 Pain in right shoulder: Secondary | ICD-10-CM

## 2021-07-21 DIAGNOSIS — E1165 Type 2 diabetes mellitus with hyperglycemia: Secondary | ICD-10-CM

## 2021-07-21 DIAGNOSIS — I480 Paroxysmal atrial fibrillation: Secondary | ICD-10-CM | POA: Diagnosis not present

## 2021-07-21 MED ORDER — APIXABAN 5 MG PO TABS: 5.0000 mg | ORAL_TABLET | Freq: Two times a day (BID) | ORAL | 2 refills | Status: DC

## 2021-07-21 MED ORDER — AMLODIPINE BESYLATE 5 MG PO TABS: 5.0000 mg | ORAL_TABLET | Freq: Every day | ORAL | 3 refills | Status: DC

## 2021-07-21 NOTE — Progress Notes (Signed)
Established Patient Office Visit  Subjective:  Patient ID: Marvin Costales., male    DOB: 1956/04/14  Age: 66 y.o. MRN: [id number]  CC:  Chief Complaint  Patient presents with   Diabetes   Hyperlipidemia   Hypertension    HPI Marvin WOOLVERTON Sr. presents for follow up on chronic medical conditions.  RIGHT SHOULDER PAIN Duration:  1 year Involved shoulder: right Mechanism of injury: unknown Location: lateral, deltoid  Quality:  sharp, dull, and aching Frequency: constant Radiation: yes shoots down arm Aggravating factors: lifting and movement, raising arm above head  Alleviating factors: rest  Status: fluctuating Treatments attempted: none and rest  Relief with NSAIDs?:  No NSAIDs Taken Weakness: no Numbness: no Decreased grip strength: no Redness: no Swelling: no Bruising: no Fevers: no  Diabetes, Type 2: -Last A1c 12/22 9.0% -Medications: Jardiance 25, Glipizide 10, Metformin 1000 twice daily, Trulicity 1.5 weekly on Saturday  -Patient is compliant with the above medications and reports no side effects.  -Checking BG at home: doesn't check - has polyuria when sugars high -Diet: drinking a lot of sugary beverages but cut that down recently, drinking more water  -Microalbumin: 12/22 -Statin: yes -PNA vaccine: Prevnar 23 in 2008, politely declines Prevnar 20 today -Denies symptoms of hypoglycemia, polydipsia, numbness extremities, foot ulcers/trauma. Polyuria getting better.    A.Fib: -Currently on Lopressor 25, had been on Eliquis 5 mg BID but ran out  -Compliant with above medications and denies adverse effects, denies abnormal bleeding.  -Following with Cardiology, seeing next week    Hypertension/OSA: -Medications: Amlodipine 10, Losartan 100, Lopressor 25  -Patient is compliant with above medications and reports no side effects. -Checking BP at home (average): not checking  -Denies any SOB, CP, vision changes, LE edema or symptoms of  hypotension -Compliant with CPAP   HLD: -Medications: Crestor 20 mg -Patient is compliant with above medications and reports no side effects.  -Last lipid panel: 6/22: TC 121, HDL 46, Triglycerides 72, LDL 60  Lipid Panel     Component Value Date/Time   CHOL 121 10/16/2020 0846   TRIG 72 10/16/2020 0846   HDL 46 10/16/2020 0846   CHOLHDL 2.6 10/16/2020 0846   CHOLHDL 3.4 06/26/2018 0818   LDLCALC 60 10/16/2020 0846   LDLCALC 83 06/26/2018 0818   LABVLDL 15 10/16/2020 0846   GERD: -Currently on Pepcid  -Symptoms well controlled.    Seasonal Allergies/PND: -Using Flonase    Health Maintenance:  -Blood work up to date, recheck A1c  Past Medical History:  Diagnosis Date   Adrenal tumor 07/26/2018   Aortic atherosclerosis ([place])    a. 07/2018 CT chest: Atherosclerotic calcifications of the thoracic aorta.   Chest pain    a. 05/2014 Ex MV: no evidence of significant ischemia, GI uptake was noted, EF 51%, no ECG changes concerning for ischemia, normal study; b. 05/2015 Ex MV: No ischemia/infarct. EF 38% (2/2 atten artifact-->EF 55-60% by echo 05/2015).   Decreased libido    Diastolic dysfunction    a. 05/2015 Echo: EF 55-60%, mild LVH. Mild MR. Mildly dil LA/RV; b. 02/2021 Echo: EF 55-60%, no rwma, mild-mod LVH, GrI DD, nl RV size/fxn. No signif valvular dzs.   HTN (hypertension)    Hyperlipidemia    IDDM (insulin dependent diabetes mellitus)    Obesity    PAF (paroxysmal atrial fibrillation) ([place])    a. Dx 12/2020. CHA2DS2VASc = 2-3 (HTN/DM/Ao atherosclerosis); b. 02/2021 Zio: RSR, rare PACs/PVCs. No Afib or other significant arrhythmias.  Pharyngitis    Sleep apnea    uses CPAP    Past Surgical History:  Procedure Laterality Date   AV FISTULA REPAIR     CARDIAC CATHETERIZATION     [facility]    COLONOSCOPY  2013   COLONOSCOPY WITH PROPOFOL N/A 12/19/2020   Procedure: COLONOSCOPY WITH PROPOFOL;  Surgeon: [name], MD;  Location: [facility] ENDOSCOPY;  Service: Gastroenterology;   Laterality: N/A;    Family History  Problem Relation Age of Onset   Breast cancer Mother    Cancer Mother    Kidney failure Father    Diabetes Father    Alcohol abuse Father    Diabetes Sister    Other Daughter     Social History   Socioeconomic History   Marital status: Married    Spouse name: Marvin Duncan   Number of children: 3   Years of education: Not on file   Highest education level: Some college, no degree  Occupational History   Not on file  Tobacco Use   Smoking status: Former    Packs/day: 0.25    Years: 25.00    Pack years: 6.25    Types: Cigarettes    Quit date: 05/17/1998    Years since quitting: 23.1   Smokeless tobacco: Never   Tobacco comments:    off and on - maybe 5 cigarettes  Vaping Use   Vaping Use: Never used  Substance and Sexual Activity   Alcohol use: No    Alcohol/week: 0.0 standard drinks    Comment: previously but not currently   Drug use: No   Sexual activity: Yes    Partners: Female  Other Topics Concern   Not on file  Social History Narrative   Not on file   Social Determinants of Health   Financial Resource Strain: Low Risk    Difficulty of Paying Living Expenses: Not hard at all  Food Insecurity: No Food Insecurity   Worried About Charity fundraiser in the Last Year: Never true   [place] in the Last Year: Never true  Transportation Needs: No Transportation Needs   Lack of Transportation (Medical): No   Lack of Transportation (Non-Medical): No  Physical Activity: Insufficiently Active   Days of Exercise per Week: 2 days   Minutes of Exercise per Session: 30 min  Stress: No Stress Concern Present   Feeling of Stress : Only a little  Social Connections: Engineer, building services of Communication with Friends and Family: Twice a week   Frequency of Social Gatherings with Friends and Family: Twice a week   Attends Religious Services: More than 4 times per year   Active Member of [employer] or Organizations: Yes    Attends Music therapist: More than 4 times per year   Marital Status: Married  Human resources officer Violence: Not At Risk   Fear of Current or Ex-Partner: No   Emotionally Abused: No   Physically Abused: No   Sexually Abused: No    Outpatient Medications Prior to Visit  Medication Sig Dispense Refill   amLODipine (NORVASC) 5 MG tablet TAKE 1 TABLET(5 MG) BY MOUTH DAILY 30 tablet 2   apixaban (ELIQUIS) 5 MG TABS tablet TAKE 1 TABLET(5 MG) BY MOUTH TWICE DAILY 90 tablet 1   Cholecalciferol (VITAMIN D) 2000 UNITS tablet Take 2,000 Units by mouth daily.     empagliflozin (JARDIANCE) 25 MG TABS tablet Take 1 tablet (25 mg total) by mouth daily. 90 tablet 1  Flaxseed, Linseed, (FLAXSEED OIL) 1000 MG CAPS Take 1,000 mg by mouth daily.      fluticasone (FLONASE) 50 MCG/ACT nasal spray USE 2 SPRAYS IN EACH  NOSTRIL DAILY (IF NEEDED  FOR ALLERGY SYMPTOMS) 48 g 3   glipiZIDE (GLUCOTROL) 10 MG tablet TAKE 1 TABLET BY MOUTH TWICE DAILY BEFORE A MEAL FOR HIGH SUGARS 180 tablet 1   losartan (COZAAR) 100 MG tablet Take 1 tablet (100 mg total) by mouth daily. 90 tablet 3   metFORMIN (GLUCOPHAGE) 1000 MG tablet Take 1 tablet (1,000 mg total) by mouth 2 (two) times daily with a meal. 180 tablet 3   metoprolol tartrate (LOPRESSOR) 25 MG tablet TAKE 1 TABLET(25 MG) BY MOUTH TWICE DAILY 60 tablet 2   Na Sulfate-K Sulfate-Mg Sulf (SUPREP BOWEL PREP KIT) 17.5-3.13-1.6 GM/177ML SOLN Take 1 kit by mouth as directed. 354 mL 0   Omega-3 Fatty Acids (FISH OIL) 1000 MG CAPS Take 1,000 mg by mouth daily.     rosuvastatin (CRESTOR) 20 MG tablet Take 1 tablet (20 mg total) by mouth daily. 90 tablet 3   traZODone (DESYREL) 100 MG tablet Take 1 tablet (100 mg total) by mouth at bedtime. 90 tablet 3   TRULICITY 1.5 WJ/1.9JY SOPN INJECT CONTENTS OF ONE SYRINGE ONCE A WEEK 6 mL 3   valACYclovir (VALTREX) 1000 MG tablet Take 1 tablet (1,000 mg total) by mouth 3 (three) times daily. 21 tablet 0   vitamin B-12  (CYANOCOBALAMIN) 1000 MCG tablet Take 1,000 mcg by mouth daily.     No facility-administered medications prior to visit.    Allergies  Allergen Reactions   Atorvastatin Other (See Comments)    Statins cause headaches and muscle aches    ROS Review of Systems  Constitutional:  Negative for chills and fever.  Eyes:  Negative for visual disturbance.  Respiratory:  Negative for cough and shortness of breath.   Cardiovascular:  Negative for chest pain, palpitations and leg swelling.  Gastrointestinal:  Negative for abdominal pain and blood in stool.  Genitourinary:  Negative for hematuria.  Neurological:  Negative for dizziness and headaches.     Objective:    Physical Exam Constitutional:      Appearance: Normal appearance.  HENT:     Head: Normocephalic and atraumatic.  Eyes:     Conjunctiva/sclera: Conjunctivae normal.  Cardiovascular:     Rate and Rhythm: Normal rate and regular rhythm.  Pulmonary:     Effort: Pulmonary effort is normal.     Breath sounds: Normal breath sounds.  Musculoskeletal:        General: Tenderness present.     Right shoulder: Tenderness and crepitus present. No swelling. Decreased range of motion.     Left shoulder: Normal.     Right lower leg: No edema.     Left lower leg: No edema.     Comments: Pain to palpitation over deltoid   Skin:    General: Skin is warm and dry.  Neurological:     General: No focal deficit present.     Mental Status: He is alert. Mental status is at baseline.  Psychiatric:        Mood and Affect: Mood normal.        Behavior: Behavior normal.    BP 108/60    Pulse 83    Temp 97.7 F (36.5 C)    Resp 16    Wt 260 lb 1.6 oz (118 kg)    SpO2 97%    BMI  34.32 kg/m  Wt Readings from Last 3 Encounters:  04/17/21 257 lb 12.8 oz (116.9 kg)  03/13/21 258 lb (117 kg)  01/27/21 253 lb (114.8 kg)     Health Maintenance Due  Topic Date Due   COVID-19 Vaccine (1) Never done   Zoster Vaccines- Shingrix (1 of 2) Never  done    There are no preventive care reminders to display for this patient.  Lab Results  Component Value Date   TSH 1.920 01/05/2021   Lab Results  Component Value Date   WBC 6.3 03/13/2021   HGB 14.3 03/13/2021   HCT 44.3 03/13/2021   MCV 87 03/13/2021   PLT 242 03/13/2021   Lab Results  Component Value Date   NA 137 03/13/2021   K 4.6 03/13/2021   CO2 19 (L) 03/13/2021   GLUCOSE 373 (H) 03/13/2021   BUN 15 03/13/2021   CREATININE 1.32 (H) 03/13/2021   BILITOT 0.4 01/05/2021   ALKPHOS 81 01/05/2021   AST 17 01/05/2021   ALT 15 01/05/2021   PROT 7.0 01/05/2021   ALBUMIN 4.3 01/05/2021   CALCIUM 9.3 03/13/2021   ANIONGAP 10 04/29/2015   EGFR 60 03/13/2021   Lab Results  Component Value Date   CHOL 121 10/16/2020   Lab Results  Component Value Date   HDL 46 10/16/2020   Lab Results  Component Value Date   LDLCALC 60 10/16/2020   Lab Results  Component Value Date   TRIG 72 10/16/2020   Lab Results  Component Value Date   CHOLHDL 2.6 10/16/2020   Lab Results  Component Value Date   HGBA1C 9.0 (A) 04/17/2021      Assessment & Plan:   Problem List Items Addressed This Visit       Cardiovascular and Mediastinum   Benign essential HTN (Chronic)    Blood pressure lower today, decrease Amlodipine dose to 5 mg.       Relevant Medications   apixaban (ELIQUIS) 5 MG TABS tablet   amLODipine (NORVASC) 5 MG tablet   Paroxysmal atrial fibrillation (H[facility])    Refilled Eliquis, seeing Cardiology soon. Continue beta blocker.      Relevant Medications   apixaban (ELIQUIS) 5 MG TABS tablet   amLODipine (NORVASC) 5 MG tablet     Respiratory   Sleep apnea (Chronic)    Compliant with CPAP.        Digestive   GERD (gastroesophageal reflux disease)     Endocrine   Uncontrolled type 2 diabetes mellitus with hyperglycemia ([place]) - Primary    Recheck A1c again today if continues to be high will increase Trulicity.       Relevant Orders   Basic  Metabolic Panel (BMET)   HgB A1c     Other   Hyperlipidemia (Chronic)    Stable, continue current medications.       Relevant Medications   apixaban (ELIQUIS) 5 MG TABS tablet   amLODipine (NORVASC) 5 MG tablet   Other Visit Diagnoses     Chronic right shoulder pain    - obtain x-ray to assess joint space, referral placed to PT.   Relevant Orders   DG Shoulder Right   Ambulatory referral to Physical Therapy       Meds ordered this encounter  Medications   apixaban (ELIQUIS) 5 MG TABS tablet    Sig: Take 1 tablet (5 mg total) by mouth 2 (two) times daily.    Dispense:  60 tablet    Refill:  2  amLODipine (NORVASC) 5 MG tablet    Sig: Take 1 tablet (5 mg total) by mouth daily.    Dispense:  90 tablet    Refill:  3    Follow-up: Return in about 3 months (around 10/21/2021).    [name], DO

## 2021-07-21 NOTE — Assessment & Plan Note (Signed)
Refilled Eliquis, seeing Cardiology soon. Continue beta blocker. ?

## 2021-07-21 NOTE — Assessment & Plan Note (Signed)
Recheck A1c again today if continues to be high will increase Trulicity.  ?

## 2021-07-21 NOTE — Assessment & Plan Note (Signed)
Compliant with CPAP 

## 2021-07-21 NOTE — Assessment & Plan Note (Signed)
Blood pressure lower today, decrease Amlodipine dose to 5 mg.  ?

## 2021-07-21 NOTE — Assessment & Plan Note (Signed)
Stable, continue current medications.  

## 2021-07-21 NOTE — Patient Instructions (Addendum)
It was great seeing you today! ? ?Plan discussed at today's visit: ?-Blood work ordered today, results will be uploaded to [facility].  ?-Restart Eliquis, discuss with Cardiology ?-Decrease Amlodipine back to 5 mg, start checking blood pressure at home 2-3 times a week ?-Right shoulder x-ray and referral to physical therapy  ? ?Follow up in: 3 months  ? ?Take care and let us know if you have any questions or concerns prior to your next visit. ? ?Dr. Rosana Berger ? ?

## 2021-07-22 LAB — BASIC METABOLIC PANEL
BUN/Creatinine Ratio: 13 (ref 10–24)
BUN: 17 mg/dL (ref 8–27)
CO2: 22 mmol/L (ref 20–29)
Calcium: 9.8 mg/dL (ref 8.6–10.2)
Chloride: 102 mmol/L (ref 96–106)
Creatinine, Ser: 1.27 mg/dL (ref 0.76–1.27)
Glucose: 136 mg/dL — ABNORMAL HIGH (ref 70–99)
Potassium: 4.6 mmol/L (ref 3.5–5.2)
Sodium: 137 mmol/L (ref 134–144)
eGFR: 63 mL/min/{1.73_m2} (ref >59)

## 2021-07-22 LAB — HEMOGLOBIN A1C
Est. average glucose Bld gHb Est-mCnc: 229 mg/dL
Hgb A1c MFr Bld: 9.6 % — ABNORMAL HIGH (ref 4.8–5.6)

## 2021-07-22 MED ORDER — TRULICITY 3 MG/0.5ML ~~LOC~~ SOAJ: 3.0000 mg | SUBCUTANEOUS | 3 refills | Status: DC

## 2021-07-22 NOTE — Addendum Note (Signed)
Addended by: [name] on: 07/22/2021 11:53 AM ? ? Modules accepted: Orders ? ?

## 2021-07-23 ENCOUNTER — Ambulatory Visit: Payer: No Typology Code available for payment source | Attending: Internal Medicine

## 2021-07-23 ENCOUNTER — Other Ambulatory Visit: Payer: Self-pay

## 2021-07-23 DIAGNOSIS — G8929 Other chronic pain: Secondary | ICD-10-CM | POA: Diagnosis present

## 2021-07-23 DIAGNOSIS — M6281 Muscle weakness (generalized): Secondary | ICD-10-CM | POA: Diagnosis present

## 2021-07-23 DIAGNOSIS — M25511 Pain in right shoulder: Secondary | ICD-10-CM | POA: Insufficient documentation

## 2021-07-23 DIAGNOSIS — M25611 Stiffness of right shoulder, not elsewhere classified: Secondary | ICD-10-CM | POA: Diagnosis present

## 2021-07-23 NOTE — Patient Instructions (Signed)
? ?  Access Code: [email or web address] ?URL: [email or web address] ?Date: 07/23/2021 ?Prepared by: Janna Arch ? ?Exercises ?Isometric Shoulder Flexion at Wall - 2 x daily - 5 x weekly - 1 sets - 5 reps - 5 hold ?Isometric Shoulder Abduction at Wall - 2 x daily - 5 x weekly - 1 sets - 5 reps - 5 hold ?Isometric Shoulder External Rotation at Wall - 2 x daily - 5 x weekly - 1 sets - 5 reps - 5 hold ?Standing Isometric Shoulder Internal Rotation at Doorway - 2 x daily - 5 x weekly - 1 sets - 2 reps - 2 hold ?Seated Scapular Retraction - 3 x daily - 5 x weekly - 2 sets - 10 reps - 3 hold ?Circular Shoulder Pendulum with Table Support - 2 x daily - 5 x weekly - 1 sets - 30 hold ?Seated Shoulder Flexion Towel Slide at Table Top - 1 x daily - 7 x weekly - 2 sets - 10 reps - 5 hold ? ? ? ? ? ?

## 2021-07-23 NOTE — Therapy (Signed)
[place] MAIN [facility] SERVICES [address] [place], [place], [address] Phone: [phone]   Fax:  [phone]  Physical Therapy Evaluation  Patient Details  Name: Marvin CORDIAL Sr. MRN: [id number] Date of Birth: 01-27-1956 Referring Provider (PT): [name]   Encounter Date: 07/23/2021   PT End of Session - 07/23/21 1219     Visit Number 1    Number of Visits 8    Date for PT Re-Evaluation 09/17/21    Authorization Type 1/10 eval 07/23/21    PT Start Time 0714    PT Stop Time 0800    PT Time Calculation (min) 46 min    Activity Tolerance Patient limited by pain    Behavior During Therapy [facility] for tasks assessed/performed             Past Medical History:  Diagnosis Date   Adrenal tumor 07/26/2018   Aortic atherosclerosis ([place])    a. 07/2018 CT chest: Atherosclerotic calcifications of the thoracic aorta.   Chest pain    a. 05/2014 Ex MV: no evidence of significant ischemia, GI uptake was noted, EF 51%, no ECG changes concerning for ischemia, normal study; b. 05/2015 Ex MV: No ischemia/infarct. EF 38% (2/2 atten artifact-->EF 55-60% by echo 05/2015).   Decreased libido    Diastolic dysfunction    a. 05/2015 Echo: EF 55-60%, mild LVH. Mild MR. Mildly dil LA/RV; b. 02/2021 Echo: EF 55-60%, no rwma, mild-mod LVH, GrI DD, nl RV size/fxn. No signif valvular dzs.   HTN (hypertension)    Hyperlipidemia    IDDM (insulin dependent diabetes mellitus)    Obesity    PAF (paroxysmal atrial fibrillation) ([place])    a. Dx 12/2020. CHA2DS2VASc = 2-3 (HTN/DM/Ao atherosclerosis); b. 02/2021 Zio: RSR, rare PACs/PVCs. No Afib or other significant arrhythmias.   Pharyngitis    Sleep apnea    uses CPAP    Past Surgical History:  Procedure Laterality Date   AV FISTULA REPAIR     CARDIAC CATHETERIZATION     [facility]    COLONOSCOPY  2013   COLONOSCOPY WITH PROPOFOL N/A 12/19/2020   Procedure: COLONOSCOPY WITH PROPOFOL;  Surgeon: [name], MD;   Location: [facility] END[facility]COPY;  Service: Gastroenterology;  Laterality: N/A;    There were no vitals filed for this visit.    Subjective Assessment - 07/23/21 0721     Subjective Patient is a 66 year old male who presents for chronic R shoulder pain.    Pertinent History Patient is a 66 year old male who presents for chronic R shoulder pain. Patient's pain has been occurring for about a year with radiating down arm. Reports snapping and popping with arm movement. PMH includes diabetes, HLD adrenal tumor, sleep apnea, PAF, and HTN. X ray on 07/21/21 show Chronic and degenerative changes without evidence of an acute osseous abnormality.  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    Limitations Lifting;Writing;Other (comment)    How long can you sit comfortably? n/a    How long can you stand comfortably? n/a    How long can you walk comfortably? n/a    Diagnostic tests X ray on 07/21/21 show Chronic and degenerative changes without evidence of an acute osseous abnormality    Patient Stated Goals get rid of pain in shoulder    Currently in Pain? Yes    Pain Score 3     Pain Location Shoulder    Pain Orientation Right    Pain Descriptors / Indicators  Aching    Pain Type Acute pain;Chronic pain    Pain Onset More than a month ago    Pain Frequency Constant    Aggravating Factors  movement    Pain Relieving Factors rest    Effect of Pain on Daily Activities limits driving and affects daily life             SUBJECTIVE Chief complaint: Onset: gradual Shoulder trauma: No  Pain quality:  Pain: 3/10 Present, 3/10 Best, 8/10 Worst: Aggravating factors:moving arm Easing factors:rest 24 hour pain behavior: depends on how he lays on it.  Radiating pain: Yes Numbness/Tingling: No Prior history of shoulder injury or pain: No Prior history of therapy for shoulder: No Follow-up appointment with MD: No Dominant hand: Right  OBJECTIVE  MUSCULOSKELETAL: Tremor: Normal Bulk: Normal Tone:  Normal  Cervical Screen AROM: WFL and painless with overpressure in all planes  ULTT Median: unable to obtain position  ULTT Ulnar: unable to obtain position  ULTT Radial: unable to obtain position  Elbow Screen Elbow AROM: Within Normal Limits  Palpation No pain with palpation to anterior, posterior, lateral, and superior shoulder  Strength R/L Unable to assess R shoulder due to pain  L shoulder grossly 4+/5  AROM R/L 97*/130 Shoulder flexion 119/180 Shoulder abduction 40/90 Shoulder external rotation 50/70 Shoulder internal rotation 50/60 Shoulder extension *Indicates pain, overpressure performed unless otherwise indicated   Accessory Motions/Glides Glenohumeral: Posterior: R: abnormal L: normal Inferior: R: abnormal L: normal Anterior: R: abnormal L: normal   Scapulothoracic: Medial: R: abnormal L: normal Lateral: R: abnormal L: normal Inferior: R: abnormal L: normal Superior: R: abnormal L: normal   NEUROLOGICAL:  Mental Status Patient is oriented to person, place and time.  Recent memory is intact.  Remote memory is intact.  Attention span and concentration are intact.  Expressive speech is intact.  Patient's fund of knowledge is within normal limits for educational level.   SPECIAL TESTS  Rotator Cuff  Drop Arm Test: negative  Painful Arc (Pain from 60 to 120 degrees scaption): Positive Infraspinatus Muscle : negative  If all 3 tests positive, the probability of a full-thickness rotator cuff tear is 91%  Subacromial Impingement Hawkins-Kennedy: Positive Neer (Block scapula, PROM flexion): Positive Painful Arc (Pain from 60 to 120 degrees scaption): Positive Empty Can: Positive External Rotation Resistance: Positive Horizontal Adduction: Positive Scapular Assist: Not done Positive Hawkins-Kennedy, Painful arc sign, Infraspinatus muscle test then +LR: 10.56 of some type of impingement present, 2/3 tests: +LR 5.06, -LR 0.17, Positive 3/5  Hawkins-Kennedy, neer, painful arc, empty can, and external rotation resistance then SN: .75 (.54-.96) SP: .74 (.61-.88) +LR: 2.93 (1.60-5.36) -LR: .34 (.14-.80)  Labral Tear Biceps Load II (120 elevation, full ER, 90 elbow flexion, full supination, resisted elbow flexion): unable to obtain position  Active Compression Test: painful but unable to achieve full testing position   Bicep Tendon Pathology Speed (shoulder flexion to 90, external rotation, full elbow extension, and forearm supination with resistance: painful to position    Outcome Measures FOTO: 47 with goal of 65%   CPR for cervicothoracic manipulation for shoulder pain Pain-free shoulder flexion < 120? Shoulder internal rotation < 53? @ 90? of abduction Negative Neers Test Not taking medications for their shoulder pain Symptoms < 90 days Probability of success: 1: 61%, 2: 78%, 3: 89% 4/5: 100%  ASSESSMENT Pt is a pleasant  66 year-old referred for R shoulder pain. PT examination reveals deficits in ROM, strength, and posture with pain being primary inhibitor.  Patient has deficits indicating potential impingement with rotator cuff involvement. Patient pain does limit testing positions for indicating which musculature is directly impacted at this time. Patient given comprehensive HEP due to decreased frequency of attendance due to patient having high copay for patient.  Pt will benefit from PT services to address deficits in strength, mobility, and pain in order to return to full function at home with less shoulder pain.        Access Code: [email or web address] URL: [email or web address] Date: 07/23/2021 Prepared by: Janna Arch  Exercises Isometric Shoulder Flexion at Wall - 2 x daily - 5 x weekly - 1 sets - 5 reps - 5 hold Isometric Shoulder Abduction at Wall - 2 x daily - 5 x weekly - 1 sets - 5 reps - 5 hold Isometric Shoulder External Rotation at Wall - 2 x daily - 5 x weekly - 1 sets - 5 reps - 5  hold Standing Isometric Shoulder Internal Rotation at Doorway - 2 x daily - 5 x weekly - 1 sets - 2 reps - 2 hold Seated Scapular Retraction - 3 x daily - 5 x weekly - 2 sets - 10 reps - 3 hold Circular Shoulder Pendulum with Table Support - 2 x daily - 5 x weekly - 1 sets - 30 hold Seated Shoulder Flexion Towel Slide at Table Top - 1 x daily - 7 x weekly - 2 sets - 10 reps - 5 hold          Objective measurements completed on examination: See above findings.       [facility] PT Assessment - 07/23/21 0001       Assessment   Medical Diagnosis R shoulder pain    Referring Provider (PT) [name]    Onset Date/Surgical Date --   a year ago   Prior Therapy no      Precautions   Precautions None      Restrictions   Weight Bearing Restrictions No      Balance Screen   Has the patient fallen in the past 6 months No    Has the patient had a decrease in activity level because of a fear of falling?  Yes    Is the patient reluctant to leave their home because of a fear of falling?  No      Prior Function   Level of Independence Independent    Vocation Full time employment    Engineer, technical sales and first aide, drives a lot      Observation/Other Assessments   Focus on Therapeutic Outcomes (FOTO)  47%                       PT Education - 07/23/21 1219     Education Details goals, POC, HEP    Person(s) Educated Patient    Methods Explanation;Demonstration;Verbal cues;Tactile cues;Handout    Comprehension Verbalized understanding;Returned demonstration;Verbal cues required;Tactile cues required              PT Short Term Goals - 07/23/21 1222       PT SHORT TERM GOAL #1   Title Pt will be independent with HEP in order to improve strength and decrease pain in order to improve pain-free function at home and work.    Baseline 3/9: HEP given    Time 4    Period Weeks    Status New    Target Date 08/20/21  PT  Long Term Goals - 07/23/21 1225       PT LONG TERM GOAL #1   Title Patient will increase FOTO score to equal to or greater than  65%   to demonstrate statistically significant improvement in mobility and quality of life    Baseline 3/9: 47%    Time 8    Period Weeks    Status New    Target Date 09/17/21      PT LONG TERM GOAL #2   Title Pt will decrease worst pain as reported on NPRS by at least 3 points (5/10) in order to demonstrate clinically significant reduction in pain.    Baseline 3/9: 8/10    Time 8    Period Weeks    Status New    Target Date 09/17/21      PT LONG TERM GOAL #3   Title Pt will increase strength of  by at least 1/2 MMT grade in order to demonstrate improvement in strength and function    Baseline 3/9: unable to strength test RUE due to pain    Time 8    Period Weeks    Status New    Target Date 09/17/21      PT LONG TERM GOAL #4   Title Patient will improve shoulder AROM to > 140 degrees of flexion, scaption, and abduction for improved ability to perform overhead activities.    Baseline 3/9: R flexion 97, abduction 119 with compensation, ER 40, IR 50    Time 8    Period Weeks    Status New    Target Date 09/17/21                    Plan - 07/23/21 1220     Clinical Impression Statement Pt is a pleasant  66 year-old referred for R shoulder pain. PT examination reveals deficits in ROM, strength, and posture with pain being primary inhibitor. Patient has deficits indicating potential impingement with rotator cuff involvement. Patient pain does limit testing positions for indicating which musculature is directly impacted at this time. Patient given comprehensive HEP due to decreased frequency of attendance due to patient having high copay for patient.  Pt will benefit from PT services to address deficits in strength, mobility, and pain in order to return to full function at home with less shoulder pain.    Personal Factors and Comorbidities  Age;Comorbidity 3+;Profession;Time since onset of injury/illness/exacerbation    Comorbidities diabetes, HLD adrenal tumor, sleep apnea, PAF, and HTN    Examination-Activity Limitations Carry;Lift;Reach Overhead    Examination-Participation Restrictions Cleaning;Driving;Meal Prep;Laundry;Occupation;Yard Work    Merchant navy officer Stable/Uncomplicated    Designer, jewellery Low    Rehab Potential Fair    PT Frequency 1x / week    PT Duration 8 weeks    PT Treatment/Interventions ADLs/Self Care Home Management;Biofeedback;Cryotherapy;Electrical Stimulation;Canalith Repostioning;Iontophoresis '4mg'$ /ml Dexamethasone;Moist Heat;Ultrasound;Traction;Functional mobility training;Therapeutic activities;Neuromuscular re-education;Therapeutic exercise;Patient/family education;Manual techniques;Passive range of motion;Dry needling;Vestibular;Taping;Splinting;Energy conservation;Visual/perceptual remediation/compensation;Joint Manipulations    PT Next Visit Plan impingement exercises and increasing ROM with strengthening    PT Home Exercise Plan see above    Consulted and Agree with Plan of Care Patient             Patient will benefit from skilled therapeutic intervention in order to improve the following deficits and impairments:  Decreased activity tolerance, Decreased endurance, Decreased range of motion, Decreased mobility, Decreased strength, Hypomobility, Impaired flexibility, Impaired UE functional use, Postural dysfunction, Improper body mechanics, Pain  Visit Diagnosis: Chronic right shoulder pain  Stiffness of right shoulder, not elsewhere classified  Muscle weakness (generalized)     Problem List Patient Active Problem List   Diagnosis Date Noted   Paroxysmal atrial fibrillation ([place]) 07/21/2021   Uncontrolled type 2 diabetes mellitus with hyperglycemia ([place]) 04/04/2020   Thoracic degenerative disc disease 11/29/2018   Insomnia 11/29/2018   Fatty liver  disease, nonalcoholic 54/49/2010   Atherosclerosis of abdominal aorta ([place]) 08/01/2018   Benign prostatic hyperplasia with urinary frequency 07/21/2018   Class 1 obesity with serious comorbidity and body mass index (BMI) of 34.0 to 34.9 in adult 04/17/2018   Ventral hernia without obstruction or gangrene 05/13/2016   GERD (gastroesophageal reflux disease) 01/04/2016   DM type 2, uncontrolled, with neuropathy 03/06/2015   Erectile dysfunction due to arterial insufficiency 10/24/2014   Hyperlipidemia 05/23/2014   Sleep apnea 05/23/2014   Benign essential HTN 02/14/2007    Janna Arch, PT, DPT  07/23/2021, 12:29 PM  [place] MAIN [facility] SERVICES [address] [place], [place], [address] Phone: [phone]   Fax:  [phone]  Name: KEIDAN AUMILLER Sr. MRN: [id number] Date of Birth: 27-Oct-1955

## 2021-07-29 ENCOUNTER — Other Ambulatory Visit: Payer: Self-pay | Admitting: Nurse Practitioner

## 2021-07-30 ENCOUNTER — Other Ambulatory Visit: Payer: Self-pay

## 2021-07-30 ENCOUNTER — Ambulatory Visit: Payer: No Typology Code available for payment source

## 2021-07-30 DIAGNOSIS — M25511 Pain in right shoulder: Secondary | ICD-10-CM | POA: Diagnosis not present

## 2021-07-30 NOTE — Therapy (Signed)
[name] [facility] ?[place], [place], [address] ?Phone: [phone]   Fax:  [phone] ? ?Physical Therapy Treatment ? ?Patient Details  ?Name: Marvin Duncan. ?MRN: [id number] ?Date of Birth: 1955-05-20 ?Referring Provider (PT): [name] ? ? ?Encounter Date: 07/30/2021 ? ? PT End of Session - 07/30/21 0724   ? ? Visit Number 2   ? Number of Visits 8   ? Date for PT Re-Evaluation 09/17/21   ? Authorization Type 2/10 eval 07/23/21   ? PT Start Time 0715   ? PT Stop Time 0800   ? PT Time Calculation (min) 45 min   ? Activity Tolerance Patient limited by pain   ? Behavior During Therapy [facility] for tasks assessed/performed   ? ?  ?  ? ?  ? ? ?Past Medical History:  ?Diagnosis Date  ? Adrenal tumor 07/26/2018  ? Aortic atherosclerosis ([place])   ? a. 07/2018 CT chest: Atherosclerotic calcifications of the thoracic aorta.  ? Chest pain   ? a. 05/2014 Ex MV: no evidence of significant ischemia, GI uptake was noted, EF 51%, no ECG changes concerning for ischemia, normal study; b. 05/2015 Ex MV: No ischemia/infarct. EF 38% (2/2 atten artifact-->EF 55-60% by echo 05/2015).  ? Decreased libido   ? Diastolic dysfunction   ? a. 05/2015 Echo: EF 55-60%, mild LVH. Mild MR. Mildly dil LA/RV; b. 02/2021 Echo: EF 55-60%, no rwma, mild-mod LVH, GrI DD, nl RV size/fxn. No signif valvular dzs.  ? HTN (hypertension)   ? Hyperlipidemia   ? IDDM (insulin dependent diabetes mellitus)   ? Obesity   ? PAF (paroxysmal atrial fibrillation) ([place])   ? a. Dx 12/2020. CHA2DS2VASc = 2-3 (HTN/DM/Ao atherosclerosis); b. 02/2021 Zio: RSR, rare PACs/PVCs. No Afib or other significant arrhythmias.  ? Pharyngitis   ? Sleep apnea   ? uses CPAP  ? ? ?Past Surgical History:  ?Procedure Laterality Date  ? AV FISTULA REPAIR    ? CARDIAC CATHETERIZATION    ? [place]   ? COLONOSCOPY  2013  ? COLONOSCOPY WITH PROPOFOL N/A 12/19/2020  ? Procedure: COLONOSCOPY WITH PROPOFOL;  Surgeon: [name], MD;   Location: [facility] ENDOSCOPY;  Service: Gastroenterology;  Laterality: N/A;  ? ? ?There were no vitals filed for this visit. ? ? Subjective Assessment - 07/30/21 0718   ? ? Subjective Patient reports intermittent compliance with HEP. Is having some R shoulder pain.   ? Pertinent History Patient is a 66 year old male who presents for chronic R shoulder pain. Patient's pain has been occurring for about a year with radiating down arm. Reports snapping and popping with arm movement. PMH includes diabetes, HLD adrenal tumor, sleep apnea, PAF, and HTN. X ray on 07/21/21 show Chronic and degenerative changes without evidence of an acute osseous abnormality.  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   ? Limitations Lifting;Writing;Other (comment)   ? How long can you sit comfortably? n/a   ? How long can you stand comfortably? n/a   ? How long can you walk comfortably? n/a   ? Diagnostic tests X ray on 07/21/21 show Chronic and degenerative changes without evidence of an acute osseous abnormality   ? Patient Stated Goals get rid of pain in shoulder   ? Currently in Pain? Yes   ? Pain Score 5    ? Pain Location Shoulder   ? Pain Orientation Right   ? Pain Descriptors / Indicators Aching   ?  Pain Type Chronic pain   ? Pain Onset More than a month ago   ? Pain Frequency Constant   ? ?  ?  ? ?  ? ? ? ? ? ? ?Manual: ?Grade I-II AP inferior and posterior shoulder glides x 3 minutes ?Scapular retraction and depression with overpressure 10x 3 second holds ?R shoulder distraction 4x 20 second holds ? ?TherEx: ?AAROM supine flexion with PVC pipe 10x; x2 sets (added to HEP) ?AAROM supine abduction with PVC pipe 10x ?Supine PVC pipe chest press 10x  ?Supine scapular retraction and protraction 10x with cue for elbow extension ?Scapular stabilization against perturbation in supine position ?Wall walk flexion 5x ? ?Shoulder isometric into PT hand 60-70% cueing 10x each direction 5 second holds ?-flexion, abduction, adduction, IR, ER,  extension  ? ?Pt educated throughout session about proper posture and technique with exercises. Improved exercise technique, movement at target joints, use of target muscles after min to mod verbal, visual, tactile cues. ? ? ? ? ? ? ? ?Access Code: [email or web address] ?URL: [email or web address] ?Date: 07/30/2021 ?Prepared by: Janna Arch ? ?Exercises ?Supine Shoulder Flexion with [place]el - 1 x daily - 7 x weekly - 2 sets - 10 reps - 5 hold ?Supine Shoulder Press with Dowel - 1 x daily - 7 x weekly - 2 sets - 10 reps - 5 hold ?Supine Shoulder Horizontal Abduction Adduction AAROM with Dowel - 1 x daily - 7 x weekly - 2 sets - 10 reps - 5 hold ? ? ? ? ?Patient educated on need for compliance with HEP due to limited in person treatment sessions. Patient HEP advanced with AAROM interventions with patient tolerating use of PVC pipe well with minimal pain increase. Patient is highly motivated and able to perform isometrics without pain increase. Pt will benefit from PT services to address deficits in strength, mobility, and pain in order to return to full function at home with less shoulder pain. ? ? ? ? ? ? ? ? ? PT Education - 07/30/21 0724   ? ? Education Details exercise technique, body mechanics   ? Person(s) Educated Patient   ? Methods Explanation;Demonstration;Tactile cues;Verbal cues   ? Comprehension Verbalized understanding;Returned demonstration;Verbal cues required;Tactile cues required   ? ?  ?  ? ?  ? ? ? PT Short Term Goals - 07/23/21 1222   ? ?  ? PT SHORT TERM GOAL #1  ? Title Pt will be independent with HEP in order to improve strength and decrease pain in order to improve pain-free function at home and work.   ? Baseline 3/9: HEP given   ? Time 4   ? Period Weeks   ? Status New   ? Target Date 08/20/21   ? ?  ?  ? ?  ? ? ? ? PT Long Term Goals - 07/23/21 1225   ? ?  ? PT LONG TERM GOAL #1  ? Title Patient will increase FOTO score to equal to or greater than  65%   to demonstrate statistically  significant improvement in mobility and quality of life   ? Baseline 3/9: 47%   ? Time 8   ? Period Weeks   ? Status New   ? Target Date 09/17/21   ?  ? PT LONG TERM GOAL #2  ? Title Pt will decrease worst pain as reported on NPRS by at least 3 points (5/10) in order to demonstrate clinically significant reduction in pain.   ? Baseline 3/9: 8/10   ?  Time 8   ? Period Weeks   ? Status New   ? Target Date 09/17/21   ?  ? PT LONG TERM GOAL #3  ? Title Pt will increase strength of  by at least 1/2 MMT grade in order to demonstrate improvement in strength and function   ? Baseline 3/9: unable to strength test RUE due to pain   ? Time 8   ? Period Weeks   ? Status New   ? Target Date 09/17/21   ?  ? PT LONG TERM GOAL #4  ? Title Patient will improve shoulder AROM to > 140 degrees of flexion, scaption, and abduction for improved ability to perform overhead activities.   ? Baseline 3/9: R flexion 97, abduction 119 with compensation, ER 40, IR 50   ? Time 8   ? Period Weeks   ? Status New   ? Target Date 09/17/21   ? ?  ?  ? ?  ? ? ? ? ? ? ? ? Plan - 07/30/21 0759   ? ? Clinical Impression Statement Patient educated on need for compliance with HEP due to limited in person treatment sessions. Patient HEP advanced with AAROM interventions with patient tolerating use of PVC pipe well with minimal pain increase. Patient is highly motivated and able to perform isometrics without pain increase. Pt will benefit from PT services to address deficits in strength, mobility, and pain in order to return to full function at home with less shoulder pain.   ? Personal Factors and Comorbidities Age;Comorbidity 3+;Profession;Time since onset of injury/illness/exacerbation   ? Comorbidities diabetes, HLD adrenal tumor, sleep apnea, PAF, and HTN   ? Examination-Activity Limitations Carry;Lift;Reach Overhead   ? Examination-Participation Restrictions Cleaning;Driving;Meal Prep;Laundry;Occupation;[employer] Work   ? Stability/Clinical Decision Making  Stable/Uncomplicated   ? Rehab Potential Fair   ? PT Frequency 1x / week   ? PT Duration 8 weeks   ? PT Treatment/Interventions ADLs/Self Care Home Management;Biofeedback;Cryotherapy;Electrical Stimulation;Canalith Repostio

## 2021-07-31 ENCOUNTER — Ambulatory Visit: Payer: No Typology Code available for payment source | Admitting: Nurse Practitioner

## 2021-07-31 ENCOUNTER — Encounter: Payer: Self-pay | Admitting: Nurse Practitioner

## 2021-07-31 VITALS — BP 124/68 | HR 67 | Ht 73.0 in | Wt 260.0 lb

## 2021-07-31 DIAGNOSIS — I10 Essential (primary) hypertension: Secondary | ICD-10-CM | POA: Diagnosis not present

## 2021-07-31 DIAGNOSIS — G4733 Obstructive sleep apnea (adult) (pediatric): Secondary | ICD-10-CM | POA: Diagnosis not present

## 2021-07-31 DIAGNOSIS — E782 Mixed hyperlipidemia: Secondary | ICD-10-CM

## 2021-07-31 DIAGNOSIS — I480 Paroxysmal atrial fibrillation: Secondary | ICD-10-CM

## 2021-07-31 DIAGNOSIS — E1165 Type 2 diabetes mellitus with hyperglycemia: Secondary | ICD-10-CM

## 2021-07-31 NOTE — Progress Notes (Signed)
? ? ?Office Visit  ?  ?Patient Name: Marvin Duncan. ?Date of Encounter: 07/31/2021 ? ?Primary Care Provider:  [name], DO ?Primary Cardiologist:  Kathlyn Sacramento, MD ? ?Chief Complaint  ?  ?66 year old male with a history of type 2 diabetes mellitus, hypertension, hyperlipidemia, obesity, sleep apnea, and chest pain with prior normal stress testing (2016, 2017), who presents for follow-up related to paroxysmal atrial fibrillation. ? ?Past Medical History  ?  ?Past Medical History:  ?Diagnosis Date  ? Adrenal tumor 07/26/2018  ? Aortic atherosclerosis ([place])   ? a. 07/2018 CT chest: Atherosclerotic calcifications of the thoracic aorta.  ? Chest pain   ? a. 05/2014 Ex MV: no evidence of significant ischemia, GI uptake was noted, EF 51%, no ECG changes concerning for ischemia, normal study; b. 05/2015 Ex MV: No ischemia/infarct. EF 38% (2/2 atten artifact-->EF 55-60% by echo 05/2015).  ? Decreased libido   ? Diastolic dysfunction   ? a. 05/2015 Echo: EF 55-60%, mild LVH. Mild MR. Mildly dil LA/RV; b. 02/2021 Echo: EF 55-60%, no rwma, mild-mod LVH, GrI DD, nl RV size/fxn. No signif valvular dzs.  ? HTN (hypertension)   ? Hyperlipidemia   ? IDDM (insulin dependent diabetes mellitus)   ? Obesity   ? PAF (paroxysmal atrial fibrillation) ([place])   ? a. Dx 12/2020. CHA2DS2VASc = 2-3 (HTN/DM/Ao atherosclerosis); b. 02/2021 Zio: RSR, rare PACs/PVCs. No Afib or other significant arrhythmias.  ? Pharyngitis   ? Sleep apnea   ? uses CPAP  ? ?Past Surgical History:  ?Procedure Laterality Date  ? AV FISTULA REPAIR    ? CARDIAC CATHETERIZATION    ? [place]   ? COLONOSCOPY  2013  ? COLONOSCOPY WITH PROPOFOL N/A 12/19/2020  ? Procedure: COLONOSCOPY WITH PROPOFOL;  Surgeon: [name], MD;  Location: [facility]COPY;  Service: Gastroenterology;  Laterality: N/A;  ? ? ?Allergies ? ?Allergies  ?Allergen Reactions  ? Atorvastatin Other (See Comments)  ?  Statins cause headaches and muscle aches  ? ? ?History of Present Illness  ?   ?66 year old male with above past medical history including type 2 diabetes mellitus diagnosed in 2000, hypertension, hyperlipidemia, obesity, sleep apnea (uses CPAP nightly), and chest pain.  He was previously seen in January 2016 in the setting of chest pain with generalized cramps, prompting ED evaluation.  Stress testing was performed and showed no evidence of ischemia, with an EF of 51%.  In December 2016, he again presented to the emergency department with chest pain and normal troponins.  He was noted to have nonspecific ST and T changes in inferolateral leads at that time.  He underwent repeat stress testing in January 2017, which was again low risk without evidence of ischemia or infarct.  Echocardiogram showed an EF of 55 to 60% with mild LVH and mild mitral regurgitation.  Throughout early 2022, he intermittently noted palpitations.  In August 2022, he presented for a routine colonoscopy was noted to be in atrial fibrillation.  He was completely asymptomatic.  He was seen in the office on January 27, 2021 and was in sinus rhythm.  He was placed on Eliquis 5 mg twice daily.  Repeat echo in October 2022 showed an EF of 55 to 60% with mild to moderate LVH, grade 1 diastolic dysfunction, and no regional wall motion abnormalities.  Zio monitoring showed sinus rhythm with rare PACs and PVCs.  No A-fib was noted. ? ?Mr. Mahmood was last seen in cardiology clinic in October 2022, at which time he  was doing well.  He was not interested in referral to EP for consideration of ablative therapy.  Since his last visit, he has done well.  He works more than 40 hours a week and also has a long commute, so notes limited time for exercise.  He does walk a lot at work.  He has not experienced any palpitations and denies chest pain, dyspnea, PND, orthopnea, dizziness, syncope, edema, or early satiety.  He has been tolerating his medications well and has not had any melena or bright red blood per rectum. ? ?Home Medications   ?  ?Current Outpatient Medications  ?Medication Sig Dispense Refill  ? amLODipine (NORVASC) 5 MG tablet Take 1 tablet (5 mg total) by mouth daily. 90 tablet 3  ? apixaban (ELIQUIS) 5 MG TABS tablet Take 1 tablet (5 mg total) by mouth 2 (two) times daily. 60 tablet 2  ? Cholecalciferol (VITAMIN D) 2000 UNITS tablet Take 2,000 Units by mouth daily.    ? Dulaglutide (TRULICITY) 3 TD/1.7OH SOPN Inject 3 mg as directed once a week. 2 mL 3  ? empagliflozin (JARDIANCE) 25 MG TABS tablet Take 1 tablet (25 mg total) by mouth daily. 90 tablet 1  ? Flaxseed, Linseed, (FLAXSEED OIL) 1000 MG CAPS Take 1,000 mg by mouth daily.     ? glipiZIDE (GLUCOTROL) 10 MG tablet TAKE 1 TABLET BY MOUTH TWICE DAILY BEFORE A MEAL FOR HIGH SUGARS 180 tablet 1  ? losartan (COZAAR) 100 MG tablet Take 1 tablet (100 mg total) by mouth daily. 90 tablet 3  ? metFORMIN (GLUCOPHAGE) 1000 MG tablet Take 1 tablet (1,000 mg total) by mouth 2 (two) times daily with a meal. 180 tablet 3  ? metoprolol tartrate (LOPRESSOR) 25 MG tablet TAKE 1 TABLET(25 MG) BY MOUTH TWICE DAILY 60 tablet 0  ? Omega-3 Fatty Acids (FISH OIL) 1000 MG CAPS Take 1,000 mg by mouth daily.    ? rosuvastatin (CRESTOR) 20 MG tablet Take 1 tablet (20 mg total) by mouth daily. 90 tablet 3  ? traZODone (DESYREL) 100 MG tablet Take 1 tablet (100 mg total) by mouth at bedtime. 90 tablet 3  ? vitamin B-12 (CYANOCOBALAMIN) 1000 MCG tablet Take 1,000 mcg by mouth daily.    ? valACYclovir (VALTREX) 1000 MG tablet Take 1 tablet (1,000 mg total) by mouth 3 (three) times daily. (Patient not taking: Reported on 07/31/2021) 21 tablet 0  ? ?No current facility-administered medications for this visit.  ?  ? ?Review of Systems  ?  ?He denies chest pain, palpitations, dyspnea, pnd, orthopnea, n, v, dizziness, syncope, edema, weight gain, or early satiety.  All other systems reviewed and are otherwise negative except as noted above. ?  ? ?Physical Exam  ?  ?VS:  BP 138/80 (BP Location: Left Arm, Patient  Position: Sitting, Cuff Size: Normal)   Pulse 67   Ht '6\' 1"'$  (1.854 m)   Wt 260 lb (117.9 kg)   SpO2 98%   BMI 34.30 kg/m?  , BMI Body mass index is 34.3 kg/m?. ?    ?Vitals:  ? 07/31/21 0829 07/31/21 0907  ?BP: 138/80 124/68  ?Pulse: 67   ?SpO2: 98%   ?  ?GEN: Obese, in no acute distress. ?HEENT: normal. ?Neck: Supple, no JVD, carotid bruits, or masses. ?Cardiac: RRR, no murmurs, rubs, or gallops. No clubbing, cyanosis, edema.  Radials/PT 2+ and equal bilaterally.  ?Respiratory:  Respirations regular and unlabored, clear to auscultation bilaterally. ?GI: Soft, nontender, nondistended, BS + x 4. ?MS: no  deformity or atrophy. ?Skin: warm and dry, no rash. ?Neuro:  Strength and sensation are intact. ?Psych: Normal affect. ? ?Accessory Clinical Findings  ?  ?ECG personally reviewed by me today -regular sinus rhythm, 67- no acute changes. ? ?Lab Results  ?Component Value Date  ? WBC 6.3 03/13/2021  ? HGB 14.3 03/13/2021  ? HCT 44.3 03/13/2021  ? MCV 87 03/13/2021  ? PLT 242 03/13/2021  ? ?Lab Results  ?Component Value Date  ? CREATININE 1.27 07/21/2021  ? BUN 17 07/21/2021  ? NA 137 07/21/2021  ? K 4.6 07/21/2021  ? CL 102 07/21/2021  ? CO2 22 07/21/2021  ? ?Lab Results  ?Component Value Date  ? ALT 15 01/05/2021  ? AST 17 01/05/2021  ? ALKPHOS 81 01/05/2021  ? BILITOT 0.4 01/05/2021  ? ?Lab Results  ?Component Value Date  ? CHOL 121 10/16/2020  ? HDL 46 10/16/2020  ? [place] 60 10/16/2020  ? TRIG 72 10/16/2020  ? CHOLHDL 2.6 10/16/2020  ?  ?Lab Results  ?Component Value Date  ? HGBA1C 9.6 (H) 07/21/2021  ? ? ?Assessment & Plan  ?  ?1.  Paroxysmal atrial fibrillation: Patient diagnosed with atrial fibrillation when he presented for routine colonoscopy in August 2022.  He has been managed with metoprolol and Eliquis without any recurrent palpitations.  ZIO monitoring in September 2022 did not reveal any atrial fibrillation and only rare PACs and PVCs were noted.  Echocardiogram in October 2022 showed normal LV  function with grade 1 diastolic dysfunction.  He remains on beta-blocker and Eliquis therapy with normal H&H in October and stable renal function and early March.  We again discussed the importance of lifestyle modificatio

## 2021-07-31 NOTE — Patient Instructions (Signed)
Medication Instructions:  ?No changes at this time.  ? ?*If you need a refill on your cardiac medications before your next appointment, please call your pharmacy* ? ? ?Lab Work: ?None ? ?If you have labs (blood work) drawn today and your tests are completely normal, you will receive your results only by: ?MyChart Message (if you have MyChart) OR ?A paper copy in the mail ?If you have any lab test that is abnormal or we need to change your treatment, we will call you to review the results. ? ? ?Testing/Procedures: ?None ? ? ?Follow-Up: ?At [facility], you and your health needs are our priority.  As part of our continuing mission to provide you with exceptional heart care, we have created designated Provider Care Teams.  These Care Teams include your primary Cardiologist (physician) and Advanced Practice Providers (APPs -  Physician Assistants and Nurse Practitioners) who all work together to provide you with the care you need, when you need it. ? ? ?Your next appointment:   ?6 month(s) ? ?The format for your next appointment:   ?In Person ? ?Provider:   ?Kathlyn Sacramento, MD or Murray Hodgkins, NP  ? ?

## 2021-08-18 ENCOUNTER — Other Ambulatory Visit: Payer: Self-pay | Admitting: Internal Medicine

## 2021-08-18 NOTE — Telephone Encounter (Signed)
Medication Refill - Medication: glipiZIDE (GLUCOTROL) 10 MG tablet  ? ?Has the patient contacted their pharmacy? Yes.   ?(Agent: If no, request that the patient contact the pharmacy for the refill. If patient does not wish to contact the pharmacy document the reason why and proceed with request.) ?(Agent: If yes, when and what did the pharmacy advise?) Call you PCP  ? ?Preferred Pharmacy (with phone number or street name): [facility] #[id number] [name], [place]  ?[address] [place], [place] [address]  ?Has the patient been seen for an appointment in the last year OR does the patient have an upcoming appointment? Yes.   ? ?Agent: Please be advised that RX refills may take up to 3 business days. We ask that you follow-up with your pharmacy. ?

## 2021-08-19 MED ORDER — GLIPIZIDE 10 MG PO TABS: ORAL_TABLET | ORAL | 1 refills | Status: DC

## 2021-08-19 NOTE — Telephone Encounter (Signed)
Requested Prescriptions  ?Pending Prescriptions Disp Refills  ?? glipiZIDE (GLUCOTROL) 10 MG tablet 180 tablet 1  ?  Sig: TAKE 1 TABLET BY MOUTH TWICE DAILY BEFORE A MEAL FOR HIGH SUGARS  ?  ? Endocrinology:  Diabetes - Sulfonylureas Failed - 08/18/2021 11:59 AM  ?  ?  Failed - HBA1C is between 0 and 7.9 and within 180 days  ?  HbA1c, POC (controlled diabetic range)  ?Date Value Ref Range Status  ?03/01/2019 6.5 0.0 - 7.0 % Final  ? ?Hgb A1c MFr Bld  ?Date Value Ref Range Status  ?07/21/2021 9.6 (H) 4.8 - 5.6 % Final  ?  Comment:  ?           Prediabetes: 5.7 - 6.4 ?         Diabetes: >6.4 ?         Glycemic control for adults with diabetes: <7.0 ?  ?   ?  ?  Passed - Cr in normal range and within 360 days  ?  Creat  ?Date Value Ref Range Status  ?06/26/2018 1.15 0.70 - 1.25 mg/dL Final  ?  Comment:  ?  For patients >37 years of age, the reference limit ?for Creatinine is approximately 13% higher for people ?identified as African-American. ?. ?  ? ?Creatinine, Ser  ?Date Value Ref Range Status  ?07/21/2021 1.27 0.76 - 1.27 mg/dL Final  ? ?Creatinine, Urine  ?Date Value Ref Range Status  ?12/26/2015 113 20 - 370 mg/dL Final  ?   ?  ?  Passed - Valid encounter within last 6 months  ?  Recent Outpatient Visits   ?      ? 4 weeks ago Uncontrolled type 2 diabetes mellitus with hyperglycemia ([place])  ? [facility] [name], DO  ? 4 months ago Uncontrolled type 2 diabetes mellitus with hyperglycemia ([place])  ? Finzel, DO  ? 7 months ago Atrial fibrillation, new onset [facility])  ? [facility] E[place], Kristeen Miss, PA-C  ? 10 months ago Colon cancer screening  ? [facility] [place], Malachy Mood, NP  ? 1 year ago Uncontrolled type 2 diabetes mellitus with hyperglycemia ([place])  ? [facility] Delsa Grana, [place]  ?  ?  ?Future Appointments   ?        ? In 2 months [name], [facility],  Person  ? In 5 months Wellington Hampshire, MD [facility], [facility]  ?  ? ?  ?  ?  ? ? ?

## 2021-08-20 ENCOUNTER — Telehealth: Payer: Self-pay

## 2021-08-20 ENCOUNTER — Ambulatory Visit: Payer: No Typology Code available for payment source | Attending: Internal Medicine

## 2021-08-20 NOTE — Telephone Encounter (Signed)
Patient called due to no-showing appointment at 7:15 this morning. Patient did not pick up; voicemail left with PT encouraging patient to call back to schedule follow up appointment as today was last scheduled appointment.  ?

## 2021-08-28 ENCOUNTER — Other Ambulatory Visit: Payer: Self-pay | Admitting: Unknown Physician Specialty

## 2021-08-28 ENCOUNTER — Other Ambulatory Visit: Payer: Self-pay | Admitting: Nurse Practitioner

## 2021-08-28 NOTE — Telephone Encounter (Signed)
Requested by interface surescripts. Last refill 07/29/21. Requesting too soon.  ?Requested Prescriptions  ?Refused Prescriptions Disp Refills  ?? JARDIANCE 25 MG TABS tablet [Pharmacy Med Name: JARDIANCE 25MG TABLETS] 90 tablet 1  ?  Sig: TAKE 1 TABLET(25 MG) BY MOUTH DAILY  ?  ? Endocrinology:  Diabetes - SGLT2 Inhibitors Failed - 08/28/2021  6:21 AM  ?  ?  Failed - HBA1C is between 0 and 7.9 and within 180 days  ?  HbA1c, POC (controlled diabetic range)  ?Date Value Ref Range Status  ?03/01/2019 6.5 0.0 - 7.0 % Final  ? ?Hgb A1c MFr Bld  ?Date Value Ref Range Status  ?07/21/2021 9.6 (H) 4.8 - 5.6 % Final  ?  Comment:  ?           Prediabetes: 5.7 - 6.4 ?         Diabetes: >6.4 ?         Glycemic control for adults with diabetes: <7.0 ?  ?   ?  ?  Passed - Cr in normal range and within 360 days  ?  Creat  ?Date Value Ref Range Status  ?06/26/2018 1.15 0.70 - 1.25 mg/dL Final  ?  Comment:  ?  For patients >33 years of age, the reference limit ?for Creatinine is approximately 13% higher for people ?identified as African-American. ?. ?  ? ?Creatinine, Ser  ?Date Value Ref Range Status  ?07/21/2021 1.27 0.76 - 1.27 mg/dL Final  ? ?Creatinine, Urine  ?Date Value Ref Range Status  ?12/26/2015 113 20 - 370 mg/dL Final  ?   ?  ?  Passed - eGFR in normal range and within 360 days  ?  GFR, Est African American  ?Date Value Ref Range Status  ?06/26/2018 79 > OR = 60 mL/min/1.63m Final  ? ?GFR calc Af Amer  ?Date Value Ref Range Status  ?04/04/2020 79 >59 mL/min/1.73 Final  ?  Comment:  ?  **In accordance with recommendations from the NKF-ASN Task force,** ?  [facility] is in the process of updating its eGFR calculation to the ?  2021 CKD-EPI creatinine equation that estimates kidney function ?  without a race variable. ?  ? ?GFR, Est Non African American  ?Date Value Ref Range Status  ?06/26/2018 68 > OR = 60 mL/min/1.762mFinal  ? ?GFR calc non Af Amer  ?Date Value Ref Range Status  ?04/04/2020 68 >59 mL/min/1.73 Final  ? ?eGFR   ?Date Value Ref Range Status  ?07/21/2021 63 >59 mL/min/1.73 Final  ?   ?  ?  Passed - Valid encounter within last 6 months  ?  Recent Outpatient Visits   ?      ? 1 month ago Uncontrolled type 2 diabetes mellitus with hyperglycemia ([place] ? [facility]  ? 4 months ago Uncontrolled type 2 diabetes mellitus with hyperglycemia (HCC[place] ? CHT[place]DO  ? 7 months ago [facility] ([facility] ? [facility]LeKristeen MissPA-C  ? 10 months ago Colon cancer screening  ? [facility] MoodNP  ? 1 year ago Uncontrolled type 2 diabetes mellitus with hyperglycemia ([place] ? [facility] GranaPA[place]?  ?  ?Future Appointments   ?        ? In 1 month AnTe[facility]ora [facility]iDOC[facility] CenterPER[facility]in[facility]? In 5 months ArWellington Hampshire  MD [place], [facility]  ?  ? ?  ?  ?  ? ?

## 2021-09-01 ENCOUNTER — Ambulatory Visit: Payer: No Typology Code available for payment source

## 2021-09-09 ENCOUNTER — Other Ambulatory Visit: Payer: Self-pay | Admitting: Internal Medicine

## 2021-09-09 NOTE — Telephone Encounter (Signed)
Medication Refill - Medication: empagliflozin (JARDIANCE) 25 MG TABS tablet ? ?Has the patient contacted their pharmacy? Yes.   Pt told to contact provider ?(Agent: If no, request that the patient contact the pharmacy for the refill. If patient does not wish to contact the pharmacy document the reason why and proceed with request.) ?(Agent: If yes, when and what did the pharmacy advise?) ? ?Preferred Pharmacy (with phone number or street name):  ?[facility] #[id number] [name], [place] [address] [place] Phone:  [phone]  ?Fax:  [phone]  ?  ? ?Has the patient been seen for an appointment in the last year OR does the patient have an upcoming appointment? Yes.   ? ?Agent: Please be advised that RX refills may take up to 3 business days. We ask that you follow-up with your pharmacy. ? ?

## 2021-09-10 MED ORDER — EMPAGLIFLOZIN 25 MG PO TABS
25.0000 mg | ORAL_TABLET | Freq: Every day | ORAL | 1 refills | Status: DC
Start: 2021-09-10 — End: 2022-03-03

## 2021-09-10 NOTE — Telephone Encounter (Signed)
Do not see RF in chart for 3/15- last RF on medication list 10/16/20 #90 1RF ?Requested Prescriptions  ?Pending Prescriptions Disp Refills  ?? empagliflozin (JARDIANCE) 25 MG TABS tablet 90 tablet 1  ?  Sig: Take 1 tablet (25 mg total) by mouth daily.  ?  ? Endocrinology:  Diabetes - SGLT2 Inhibitors Failed - 09/10/2021 10:17 AM  ?  ?  Failed - HBA1C is between 0 and 7.9 and within 180 days  ?  HbA1c, POC (controlled diabetic range)  ?Date Value Ref Range Status  ?03/01/2019 6.5 0.0 - 7.0 % Final  ? ?Hgb A1c MFr Bld  ?Date Value Ref Range Status  ?07/21/2021 9.6 (H) 4.8 - 5.6 % Final  ?  Comment:  ?           Prediabetes: 5.7 - 6.4 ?         Diabetes: >6.4 ?         Glycemic control for adults with diabetes: <7.0 ?  ?   ?  ?  Passed - Cr in normal range and within 360 days  ?  Creat  ?Date Value Ref Range Status  ?06/26/2018 1.15 0.70 - 1.25 mg/dL Final  ?  Comment:  ?  For patients >64 years of age, the reference limit ?for Creatinine is approximately 13% higher for people ?identified as African-American. ?. ?  ? ?Creatinine, Ser  ?Date Value Ref Range Status  ?07/21/2021 1.27 0.76 - 1.27 mg/dL Final  ? ?Creatinine, Urine  ?Date Value Ref Range Status  ?12/26/2015 113 20 - 370 mg/dL Final  ?   ?  ?  Passed - eGFR in normal range and within 360 days  ?  GFR, Est African American  ?Date Value Ref Range Status  ?06/26/2018 79 > OR = 60 mL/min/1.72m Final  ? ?GFR calc Af Amer  ?Date Value Ref Range Status  ?04/04/2020 79 >59 mL/min/1.73 Final  ?  Comment:  ?  **In accordance with recommendations from the NKF-ASN Task force,** ?  [facility] is in the process of updating its eGFR calculation to the ?  2021 CKD-EPI creatinine equation that estimates kidney function ?  without a race variable. ?  ? ?GFR, Est Non African American  ?Date Value Ref Range Status  ?06/26/2018 68 > OR = 60 mL/min/1.736mFinal  ? ?GFR calc non Af Amer  ?Date Value Ref Range Status  ?04/04/2020 68 >59 mL/min/1.73 Final  ? ?eGFR  ?Date Value Ref Range  Status  ?07/21/2021 63 >59 mL/min/1.73 Final  ?   ?  ?  Passed - Valid encounter within last 6 months  ?  Recent Outpatient Visits   ?      ? 1 month ago Uncontrolled type 2 diabetes mellitus with hyperglycemia ([place] ? [facility]iDO  ? 4 months ago Uncontrolled type 2 diabetes mellitus with hyperglycemia (HCWest [place] ? CHFisherDO  ? 8 months ago [facility]rial fibrillation, new onset ([facility] ? [facility] CampusaHunting[place] ParkLeKristeen MissPA-C  ? 10 months ago Colon cancer screening  ? [facility]Col[facility] [place] HillsChMalachy MoodNP  ? 1 year ago Uncontrolled type 2 diabetes mellitus with hyperglycemia (HCR[place] ? [facility] HealthaDelsa GranaPAV[place]?  ?  ?Future Appointments   ?        ? In 1 month AnTeodora MediciDOSouth Eliot Medical CenterPECoatesville? In 4 months ArKathlyn Sacramento  A, MD [place], [facility]  ?  ? ?  ?  ?  ? ?

## 2021-09-17 LAB — HM DIABETES EYE EXAM

## 2021-09-21 ENCOUNTER — Ambulatory Visit: Payer: No Typology Code available for payment source | Attending: Internal Medicine

## 2021-09-21 NOTE — Progress Notes (Signed)
New dx of diabetic retinopathy. Provider notified.  ?

## 2021-09-27 ENCOUNTER — Other Ambulatory Visit: Payer: Self-pay | Admitting: Unknown Physician Specialty

## 2021-09-27 DIAGNOSIS — I700 Atherosclerosis of aorta: Secondary | ICD-10-CM

## 2021-09-27 DIAGNOSIS — I10 Essential (primary) hypertension: Secondary | ICD-10-CM

## 2021-09-29 NOTE — Telephone Encounter (Signed)
Requested Prescriptions  ?Pending Prescriptions Disp Refills  ?? losartan (COZAAR) 100 MG tablet [Pharmacy Med Name: LOSARTAN '100MG'$  TABLETS] 90 tablet 1  ?  Sig: TAKE 1 TABLET(100 MG) BY MOUTH DAILY  ?  ? Cardiovascular:  Angiotensin Receptor Blockers Passed - 09/27/2021  6:23 AM  ?  ?  Passed - Cr in normal range and within 180 days  ?  Creat  ?Date Value Ref Range Status  ?06/26/2018 1.15 0.70 - 1.25 mg/dL Final  ?  Comment:  ?  For patients >41 years of age, the reference limit ?for Creatinine is approximately 13% higher for people ?identified as African-American. ?. ?  ? ?Creatinine, Ser  ?Date Value Ref Range Status  ?07/21/2021 1.27 0.76 - 1.27 mg/dL Final  ? ?Creatinine, Urine  ?Date Value Ref Range Status  ?12/26/2015 113 20 - 370 mg/dL Final  ?   ?  ?  Passed - K in normal range and within 180 days  ?  Potassium  ?Date Value Ref Range Status  ?07/21/2021 4.6 3.5 - 5.2 mmol/L Final  ?04/22/2014 3.5 3.5 - 5.1 mmol/L Final  ?   ?  ?  Passed - Patient is not pregnant  ?  ?  Passed - Last BP in normal range  ?  BP Readings from Last 1 Encounters:  ?07/31/21 124/68  ?   ?  ?  Passed - Valid encounter within last 6 months  ?  Recent Outpatient Visits   ?      ? 2 months ago Uncontrolled type 2 diabetes mellitus with hyperglycemia ([place])  ? [place], DO  ? 5 months ago Uncontrolled type 2 diabetes mellitus with hyperglycemia ([place])  ? [place], DO  ? 9 months ago Atrial fibrillation, new onset [facility])  ? [facility] [place], Kristeen Miss, [place]  ? 11 months ago Colon cancer screening  ? [facility] [place], Malachy Mood, NP  ? 1 year ago Uncontrolled type 2 diabetes mellitus with hyperglycemia ([place])  ? [facility] Delsa Grana, [place]  ?  ?  ?Future Appointments   ?        ? In 3 weeks [name], DO [facility], [place]  ? In 4 months Fletcher Anon, Mertie Clause, MD [facility], [facility]  ?  ? ?  ?  ?  ? ?

## 2021-10-10 ENCOUNTER — Other Ambulatory Visit: Payer: Self-pay | Admitting: Internal Medicine

## 2021-10-10 DIAGNOSIS — I480 Paroxysmal atrial fibrillation: Secondary | ICD-10-CM

## 2021-10-13 NOTE — Telephone Encounter (Signed)
Requested Prescriptions  Pending Prescriptions Disp Refills  . ELIQUIS 5 MG TABS tablet [Pharmacy Med Name: ELIQUIS '5MG'$  TABLETS] 180 tablet 0    Sig: TAKE 1 TABLET(5 MG) BY MOUTH TWICE DAILY     Hematology:  Anticoagulants - apixaban Passed - 10/10/2021 11:42 AM      Passed - PLT in normal range and within 360 days    Platelets  Date Value Ref Range Status  03/13/2021 242 150 - 450 x10E3/uL Final         Passed - HGB in normal range and within 360 days    Hemoglobin  Date Value Ref Range Status  03/13/2021 14.3 13.0 - 17.7 g/dL Final         Passed - HCT in normal range and within 360 days    Hematocrit  Date Value Ref Range Status  03/13/2021 44.3 37.5 - 51.0 % Final         Passed - Cr in normal range and within 360 days    Creat  Date Value Ref Range Status  06/26/2018 1.15 0.70 - 1.25 mg/dL Final    Comment:    For patients >22 years of age, the reference limit for Creatinine is approximately 13% higher for people identified as African-American. .    Creatinine, Ser  Date Value Ref Range Status  07/21/2021 1.27 0.76 - 1.27 mg/dL Final   Creatinine, Urine  Date Value Ref Range Status  12/26/2015 113 20 - 370 mg/dL Final         Passed - AST in normal range and within 360 days    AST  Date Value Ref Range Status  01/05/2021 17 0 - 40 IU/L Final         Passed - ALT in normal range and within 360 days    ALT  Date Value Ref Range Status  01/05/2021 15 0 - 44 IU/L Final         Passed - Valid encounter within last 12 months    Recent Outpatient Visits          2 months ago Uncontrolled type 2 diabetes mellitus with hyperglycemia [facility])   [facility] [name], DO   5 months ago Uncontrolled type 2 diabetes mellitus with hyperglycemia [facility])   [facility] [name], DO   9 months ago Atrial fibrillation, new onset [facility])   [facility] Delsa Grana, PA-C   12 months ago Colon  cancer screening   [facility] [place], Malachy Mood, NP   1 year ago Uncontrolled type 2 diabetes mellitus with hyperglycemia [facility])   [facility] Delsa Grana, PA-C      Future Appointments            In 1 week [name], [facility], [place]   In 3 months Fletcher Anon, Mertie Clause, MD [facility], [place]

## 2021-10-19 ENCOUNTER — Ambulatory Visit: Payer: No Typology Code available for payment source | Attending: Internal Medicine

## 2021-10-19 ENCOUNTER — Other Ambulatory Visit: Payer: Self-pay | Admitting: Family Medicine

## 2021-10-19 ENCOUNTER — Telehealth: Payer: Self-pay | Admitting: Internal Medicine

## 2021-10-19 DIAGNOSIS — I700 Atherosclerosis of aorta: Secondary | ICD-10-CM

## 2021-10-19 DIAGNOSIS — E1165 Type 2 diabetes mellitus with hyperglycemia: Secondary | ICD-10-CM

## 2021-10-19 DIAGNOSIS — E782 Mixed hyperlipidemia: Secondary | ICD-10-CM

## 2021-10-19 NOTE — Therapy (Signed)
[place] [facility] SERVICES [address] [place], [place], [address] Phone: [phone]   Fax:  [phone]  October 19, 2021   No Recipients  Physical Therapy Discharge Summary  Patient: Marvin SIEDSCHLAG Sr.  MRN: [id number]  Date of Birth: Sep 05, 1955   Diagnosis: No diagnosis found. Referring Provider (PT): [name]   The above patient had been seen in Physical Therapy 2 times of 5 treatments scheduled with 3 no shows and 0 cancellations.  The treatment consisted of manual and therex The patient is:  unknown due to no show  Subjective: Patient has not shown up to any PT visits after second visit. Unable to contact patient via phone  Discharge Findings: Unsure due to no show   Functional Status at Discharge: unknown due to no show.   No Goals Met    Sincerely,   Janna Arch, PT, DPT    CC No Recipients  [place] [facility] SERVICES [address] [place], [place], [address] Phone: [phone]   Fax:  [phone]  Patient: Marvin TRAWICK Sr.  MRN: [id number]  Date of Birth: [birth year]

## 2021-10-19 NOTE — Telephone Encounter (Signed)
Medication Refill - Medication: metFORMIN (GLUCOPHAGE) 1000 MG tablet   Pt is completely out, requesting urgent refill   Has the patient contacted their pharmacy? Yes.   (Agent: If no, request that the patient contact the pharmacy for the refill. If patient does not wish to contact the pharmacy document the reason why and proceed with request.) (Agent: If yes, when and what did the pharmacy advise?)  Preferred Pharmacy (with phone number or street name):  [facility] [place], [address]  [place] [address]  Phone: [phone] Fax: [phone]   Has the patient been seen for an appointment in the last year OR does the patient have an upcoming appointment? Yes.    Agent: Please be advised that RX refills may take up to 3 business days. We ask that you follow-up with your pharmacy.

## 2021-10-19 NOTE — Telephone Encounter (Signed)
Unable to reorder due to diagnosis is needed, routing to provider.

## 2021-10-20 MED ORDER — METFORMIN HCL 1000 MG PO TABS: 1000.0000 mg | ORAL_TABLET | Freq: Two times a day (BID) | ORAL | 0 refills | Status: DC

## 2021-10-21 ENCOUNTER — Ambulatory Visit: Payer: No Typology Code available for payment source | Admitting: Internal Medicine

## 2021-10-27 ENCOUNTER — Other Ambulatory Visit: Payer: Self-pay | Admitting: Unknown Physician Specialty

## 2021-10-27 DIAGNOSIS — G4700 Insomnia, unspecified: Secondary | ICD-10-CM

## 2021-10-27 NOTE — Telephone Encounter (Signed)
Requested medication (s) are due for refill today -expired Rx  Requested medication (s) are on the active medication list -yes  Future visit scheduled -yes  Last refill: 10/16/20 #90 3RF  Notes to clinic: expired Rx  Requested Prescriptions  Pending Prescriptions Disp Refills   traZODone (DESYREL) 100 MG tablet [Pharmacy Med Name: TRAZODONE '100MG'$  TABLETS] 90 tablet 3    Sig: TAKE 1 TABLET(100 MG) BY MOUTH AT BEDTIME     Psychiatry: Antidepressants - Serotonin Modulator Passed - 10/27/2021  6:20 AM      Passed - Valid encounter within last 6 months    Recent Outpatient Visits           3 months ago Uncontrolled type 2 diabetes mellitus with hyperglycemia [facility])   [facility] [name], DO   6 months ago Uncontrolled type 2 diabetes mellitus with hyperglycemia [facility])   [facility] [name], DO   9 months ago Atrial fibrillation, new onset [facility])   [facility] Delsa Grana, PA-C   1 year ago Colon cancer screening   [facility] [place], Malachy Mood, NP   1 year ago Uncontrolled type 2 diabetes mellitus with hyperglycemia [facility])   [facility] Delsa Grana, PA-C       Future Appointments             In 2 weeks [name], DO [facility], [place]   In 3 months Fletcher Anon, Mertie Clause, MD [facility], [facility]               Requested Prescriptions  Pending Prescriptions Disp Refills   traZODone (DESYREL) 100 MG tablet [Pharmacy Med Name: TRAZODONE '100MG'$  TABLETS] 90 tablet 3    Sig: TAKE 1 TABLET(100 MG) BY MOUTH AT BEDTIME     Psychiatry: Antidepressants - Serotonin Modulator Passed - 10/27/2021  6:20 AM      Passed - Valid encounter within last 6 months    Recent Outpatient Visits           3 months ago Uncontrolled type 2 diabetes mellitus with hyperglycemia [facility])   [facility] [name], DO    6 months ago Uncontrolled type 2 diabetes mellitus with hyperglycemia [facility])   [place], DO   9 months ago Atrial fibrillation, new onset [facility])   [facility] Delsa Grana, PA-C   1 year ago Colon cancer screening   [facility] [place], Malachy Mood, NP   1 year ago Uncontrolled type 2 diabetes mellitus with hyperglycemia [facility])   [facility] Delsa Grana, PA-C       Future Appointments             In 2 weeks [name], DO [facility], [place]   In 3 months Fletcher Anon, Mertie Clause, MD [facility], [place]

## 2021-11-12 ENCOUNTER — Ambulatory Visit: Payer: No Typology Code available for payment source | Admitting: Internal Medicine

## 2021-11-12 NOTE — Progress Notes (Deleted)
Established Patient Office Visit  Subjective:  Patient ID: Marvin Voong., male    DOB: August 18, 1955  Age: 66 y.o. MRN: [id number]  CC:  No chief complaint on file.   HPI Marvin HAVERLAND Sr. presents for follow up on chronic medical conditions.  RIGHT SHOULDER PAIN Duration:  1 year Involved shoulder: right Mechanism of injury: unknown Location: lateral, deltoid  Quality:  sharp, dull, and aching Frequency: constant Radiation: yes shoots down arm Aggravating factors: lifting and movement, raising arm above head  Alleviating factors: rest  Status: fluctuating Treatments attempted: none and rest  Relief with NSAIDs?:  No NSAIDs Taken Weakness: no Numbness: no Decreased grip strength: no Redness: no Swelling: no Bruising: no Fevers: no  Diabetes, Type 2: -Last A1c 12/22 9.0% - 3/23 9.6% -Medications: Jardiance 25, Glipizide 10, Metformin 1000 twice daily, Trulicity increased to 3.0 mg weekly oat LOV (takes on Saturdays) -Patient is compliant with the above medications and reports no side effects.  -Checking BG at home: doesn't check - has polyuria when sugars high -Diet: drinking a lot of sugary beverages but cut that down recently, drinking more water  -Microalbumin: 12/22 -Statin: yes -PNA vaccine: Prevnar 23 in 2008, politely declines Prevnar 20 today -Denies symptoms of hypoglycemia, polydipsia, numbness extremities, foot ulcers/trauma. Polyuria getting better.    A.Fib: -Currently on Lopressor 25, had been on Eliquis 5 mg BID but ran out  -Compliant with above medications and denies adverse effects, denies abnormal bleeding.  -Following with Cardiology, seeing next week    Hypertension/OSA: -Medications: Amlodipine 10, Losartan 100, Lopressor 25  -Patient is compliant with above medications and reports no side effects. -Checking BP at home (average): not checking  -Denies any SOB, CP, vision changes, LE edema or symptoms of hypotension -Compliant with CPAP    HLD: -Medications: Crestor 20 mg -Patient is compliant with above medications and reports no side effects.  -Last lipid panel: 6/22: TC 121, HDL 46, Triglycerides 72, LDL 60  Lipid Panel     Component Value Date/Time   CHOL 121 10/16/2020 0846   TRIG 72 10/16/2020 0846   HDL 46 10/16/2020 0846   CHOLHDL 2.6 10/16/2020 0846   CHOLHDL 3.4 06/26/2018 0818   LDLCALC 60 10/16/2020 0846   LDLCALC 83 06/26/2018 0818   LABVLDL 15 10/16/2020 0846   GERD: -Currently on Pepcid  -Symptoms well controlled.    Seasonal Allergies/PND: -Using Flonase    Health Maintenance:  -Blood work up to date, recheck A1c  Past Medical History:  Diagnosis Date   Adrenal tumor 07/26/2018   Aortic atherosclerosis ([place])    a. 07/2018 CT chest: Atherosclerotic calcifications of the thoracic aorta.   Chest pain    a. 05/2014 Ex MV: no evidence of significant ischemia, GI uptake was noted, EF 51%, no ECG changes concerning for ischemia, normal study; b. 05/2015 Ex MV: No ischemia/infarct. EF 38% (2/2 atten artifact-->EF 55-60% by echo 05/2015).   Decreased libido    Diastolic dysfunction    a. 05/2015 Echo: EF 55-60%, mild LVH. Mild MR. Mildly dil LA/RV; b. 02/2021 Echo: EF 55-60%, no rwma, mild-mod LVH, GrI DD, nl RV size/fxn. No signif valvular dzs.   HTN (hypertension)    Hyperlipidemia    IDDM (insulin dependent diabetes mellitus)    Obesity    PAF (paroxysmal atrial fibrillation) ([place])    a. Dx 12/2020. CHA2DS2VASc = 2-3 (HTN/DM/Ao atherosclerosis); b. 02/2021 Zio: RSR, rare PACs/PVCs. No Afib or other significant arrhythmias.   Pharyngitis  Sleep apnea    uses CPAP    Past Surgical History:  Procedure Laterality Date   AV FISTULA REPAIR     CARDIAC CATHETERIZATION     [facility]    COLONOSCOPY  2013   COLONOSCOPY WITH PROPOFOL N/A 12/19/2020   Procedure: COLONOSCOPY WITH PROPOFOL;  Surgeon: [name], MD;  Location: [facility] ENDOSCOPY;  Service: Gastroenterology;  Laterality: N/A;    Family History   Problem Relation Age of Onset   Breast cancer Mother    Cancer Mother    Kidney failure Father    Diabetes Father    Alcohol abuse Father    Diabetes Sister    Other Daughter     Social History   Socioeconomic History   Marital status: Married    Spouse name: Glenda   Number of children: 3   Years of education: Not on file   Highest education level: Some college, no degree  Occupational History   Not on file  Tobacco Use   Smoking status: Former    Packs/day: 0.25    Years: 25.00    Total pack years: 6.25    Types: Cigarettes    Quit date: 05/17/1998    Years since quitting: 23.5   Smokeless tobacco: Never   Tobacco comments:    off and on - maybe 5 cigarettes  Vaping Use   Vaping Use: Never used  Substance and Sexual Activity   Alcohol use: No    Alcohol/week: 0.0 standard drinks of alcohol    Comment: previously but not currently   Drug use: No   Sexual activity: Yes    Partners: Female  Other Topics Concern   Not on file  Social History Narrative   Not on file   Social Determinants of Health   Financial Resource Strain: Low Risk  (10/16/2020)   Overall Financial Resource Strain (CARDIA)    Difficulty of Paying Living Expenses: Not hard at all  Food Insecurity: No Food Insecurity (10/16/2020)   Hunger Vital Sign    Worried About Running Out of Food in the Last Year: Never true    Ran Out of Food in the Last Year: Never true  Transportation Needs: No Transportation Needs (10/16/2020)   PRAPARE - Hydrologist (Medical): No    Lack of Transportation (Non-Medical): No  Physical Activity: Insufficiently Active (10/16/2020)   Exercise Vital Sign    Days of Exercise per Week: 2 days    Minutes of Exercise per Session: 30 min  Stress: No Stress Concern Present (10/16/2020)   [place]    Feeling of Stress : Only a little  Social Connections: Socially Integrated (10/16/2020)    Social Connection and Isolation Panel [NHANES]    Frequency of Communication with Friends and Family: Twice a week    Frequency of Social Gatherings with Friends and Family: Twice a week    Attends Religious Services: More than 4 times per year    Active Member of [employer] or Organizations: Yes    Attends Music therapist: More than 4 times per year    Marital Status: Married  Human resources officer Violence: Not At Risk (10/16/2020)   Humiliation, Afraid, Rape, and Kick questionnaire    Fear of Current or Ex-Partner: No    Emotionally Abused: No    Physically Abused: No    Sexually Abused: No    Outpatient Medications Prior to Visit  Medication Sig Dispense Refill  traZODone (DESYREL) 100 MG tablet TAKE 1 TABLET(100 MG) BY MOUTH AT BEDTIME 90 tablet 0   amLODipine (NORVASC) 5 MG tablet Take 1 tablet (5 mg total) by mouth daily. 90 tablet 3   Cholecalciferol (VITAMIN D) 2000 UNITS tablet Take 2,000 Units by mouth daily.     Dulaglutide (TRULICITY) 3 YI/5.0YD SOPN Inject 3 mg as directed once a week. 2 mL 3   ELIQUIS 5 MG TABS tablet TAKE 1 TABLET(5 MG) BY MOUTH TWICE DAILY 180 tablet 0   empagliflozin (JARDIANCE) 25 MG TABS tablet Take 1 tablet (25 mg total) by mouth daily. 90 tablet 1   Flaxseed, Linseed, (FLAXSEED OIL) 1000 MG CAPS Take 1,000 mg by mouth daily.      glipiZIDE (GLUCOTROL) 10 MG tablet TAKE 1 TABLET BY MOUTH TWICE DAILY BEFORE A MEAL FOR HIGH SUGARS 180 tablet 1   losartan (COZAAR) 100 MG tablet TAKE 1 TABLET(100 MG) BY MOUTH DAILY 90 tablet 1   metFORMIN (GLUCOPHAGE) 1000 MG tablet Take 1 tablet (1,000 mg total) by mouth 2 (two) times daily with a meal. 60 tablet 0   metoprolol tartrate (LOPRESSOR) 25 MG tablet TAKE 1 TABLET(25 MG) BY MOUTH TWICE DAILY 60 tablet 4   Omega-3 Fatty Acids (FISH OIL) 1000 MG CAPS Take 1,000 mg by mouth daily.     rosuvastatin (CRESTOR) 20 MG tablet TAKE 1 TABLET(20 MG) BY MOUTH AT BEDTIME 30 tablet 1   valACYclovir (VALTREX) 1000 MG  tablet Take 1 tablet (1,000 mg total) by mouth 3 (three) times daily. (Patient not taking: Reported on 07/31/2021) 21 tablet 0   vitamin B-12 (CYANOCOBALAMIN) 1000 MCG tablet Take 1,000 mcg by mouth daily.     No facility-administered medications prior to visit.    Allergies  Allergen Reactions   Atorvastatin Other (See Comments)    Statins cause headaches and muscle aches    ROS Review of Systems  Constitutional:  Negative for chills and fever.  Eyes:  Negative for visual disturbance.  Respiratory:  Negative for cough and shortness of breath.   Cardiovascular:  Negative for chest pain, palpitations and leg swelling.  Gastrointestinal:  Negative for abdominal pain and blood in stool.  Genitourinary:  Negative for hematuria.  Neurological:  Negative for dizziness and headaches.      Objective:    Physical Exam Constitutional:      Appearance: Normal appearance.  HENT:     Head: Normocephalic and atraumatic.  Eyes:     Conjunctiva/sclera: Conjunctivae normal.  Cardiovascular:     Rate and Rhythm: Normal rate and regular rhythm.  Pulmonary:     Effort: Pulmonary effort is normal.     Breath sounds: Normal breath sounds.  Musculoskeletal:        General: Tenderness present.     Right shoulder: Tenderness and crepitus present. No swelling. Decreased range of motion.     Left shoulder: Normal.     Right lower leg: No edema.     Left lower leg: No edema.     Comments: Pain to palpitation over deltoid   Skin:    General: Skin is warm and dry.  Neurological:     General: No focal deficit present.     Mental Status: He is alert. Mental status is at baseline.  Psychiatric:        Mood and Affect: Mood normal.        Behavior: Behavior normal.     There were no vitals taken for this visit. Wt Readings  from Last 3 Encounters:  07/31/21 260 lb (117.9 kg)  07/21/21 260 lb 1.6 oz (118 kg)  04/17/21 257 lb 12.8 oz (116.9 kg)     Health Maintenance Due  Topic Date Due    COVID-19 Vaccine (1) Never done   Zoster Vaccines- Shingrix (1 of 2) Never done    There are no preventive care reminders to display for this patient.  Lab Results  Component Value Date   TSH 1.920 01/05/2021   Lab Results  Component Value Date   WBC 6.3 03/13/2021   HGB 14.3 03/13/2021   HCT 44.3 03/13/2021   MCV 87 03/13/2021   PLT 242 03/13/2021   Lab Results  Component Value Date   NA 137 07/21/2021   K 4.6 07/21/2021   CO2 22 07/21/2021   GLUCOSE 136 (H) 07/21/2021   BUN 17 07/21/2021   CREATININE 1.27 07/21/2021   BILITOT 0.4 01/05/2021   ALKPHOS 81 01/05/2021   AST 17 01/05/2021   ALT 15 01/05/2021   PROT 7.0 01/05/2021   ALBUMIN 4.3 01/05/2021   CALCIUM 9.8 07/21/2021   ANIONGAP 10 04/29/2015   EGFR 63 07/21/2021   Lab Results  Component Value Date   CHOL 121 10/16/2020   Lab Results  Component Value Date   HDL 46 10/16/2020   Lab Results  Component Value Date   LDLCALC 60 10/16/2020   Lab Results  Component Value Date   TRIG 72 10/16/2020   Lab Results  Component Value Date   CHOLHDL 2.6 10/16/2020   Lab Results  Component Value Date   HGBA1C 9.6 (H) 07/21/2021      Assessment & Plan:   Problem List Items Addressed This Visit       Cardiovascular and Mediastinum   Benign essential HTN (Chronic)    Blood pressure lower today, decrease Amlodipine dose to 5 mg.       Relevant Medications   apixaban (ELIQUIS) 5 MG TABS tablet   amLODipine (NORVASC) 5 MG tablet   Paroxysmal atrial fibrillation (HCC)    Refilled Eliquis, seeing Cardiology soon. Continue beta blocker.      Relevant Medications   apixaban (ELIQUIS) 5 MG TABS tablet   amLODipine (NORVASC) 5 MG tablet     Respiratory   Sleep apnea (Chronic)    Compliant with CPAP.        Digestive   GERD (gastroesophageal reflux disease)     Endocrine   Uncontrolled type 2 diabetes mellitus with hyperglycemia ([place]) - Primary    Recheck A1c again today if continues to be high  will increase Trulicity.       Relevant Orders   Basic Metabolic Panel (BMET)   HgB A1c     Other   Hyperlipidemia (Chronic)    Stable, continue current medications.       Relevant Medications   apixaban (ELIQUIS) 5 MG TABS tablet   amLODipine (NORVASC) 5 MG tablet   Other Visit Diagnoses     Chronic right shoulder pain    - obtain x-ray to assess joint space, referral placed to PT.   Relevant Orders   DG Shoulder Right   Ambulatory referral to Physical Therapy       No orders of the defined types were placed in this encounter.   Follow-up: No follow-ups on file.    [name], DO

## 2021-11-20 ENCOUNTER — Ambulatory Visit: Payer: No Typology Code available for payment source | Admitting: Internal Medicine

## 2021-11-20 ENCOUNTER — Encounter: Payer: Self-pay | Admitting: Internal Medicine

## 2021-11-20 VITALS — BP 128/76 | HR 81 | Temp 97.7°F | Resp 16 | Ht 73.5 in | Wt 259.6 lb

## 2021-11-20 DIAGNOSIS — E1165 Type 2 diabetes mellitus with hyperglycemia: Secondary | ICD-10-CM

## 2021-11-20 DIAGNOSIS — I10 Essential (primary) hypertension: Secondary | ICD-10-CM | POA: Diagnosis not present

## 2021-11-20 DIAGNOSIS — I480 Paroxysmal atrial fibrillation: Secondary | ICD-10-CM | POA: Diagnosis not present

## 2021-11-20 DIAGNOSIS — E782 Mixed hyperlipidemia: Secondary | ICD-10-CM

## 2021-11-20 DIAGNOSIS — G4733 Obstructive sleep apnea (adult) (pediatric): Secondary | ICD-10-CM

## 2021-11-20 DIAGNOSIS — K219 Gastro-esophageal reflux disease without esophagitis: Secondary | ICD-10-CM

## 2021-11-20 DIAGNOSIS — Z9989 Dependence on other enabling machines and devices: Secondary | ICD-10-CM

## 2021-11-20 DIAGNOSIS — I700 Atherosclerosis of aorta: Secondary | ICD-10-CM

## 2021-11-20 LAB — POCT GLYCOSYLATED HEMOGLOBIN (HGB A1C): Hemoglobin A1C: 8.7 % — AB (ref 4.0–5.6)

## 2021-11-20 MED ORDER — BLOOD GLUCOSE METER KIT
PACK | 0 refills | Status: DC
Start: 2021-11-20 — End: 2024-02-24

## 2021-11-20 MED ORDER — ROSUVASTATIN CALCIUM 20 MG PO TABS: ORAL_TABLET | ORAL | 1 refills | Status: DC

## 2021-11-20 MED ORDER — METFORMIN HCL 1000 MG PO TABS: 1000.0000 mg | ORAL_TABLET | Freq: Two times a day (BID) | ORAL | 1 refills | Status: DC

## 2021-11-20 MED ORDER — GLIPIZIDE 10 MG PO TABS
ORAL_TABLET | ORAL | 1 refills | Status: DC
Start: 2021-11-20 — End: 2022-04-30

## 2021-11-20 NOTE — Assessment & Plan Note (Signed)
Stable, controlled with over the counter Pepcid.

## 2021-11-20 NOTE — Patient Instructions (Addendum)
It was great seeing you today!  Plan discussed at today's visit: -A1c today 8.7% -Refills sent -Work on diet, decrease sugar and carbs  -Start checking blood sugar in the morning before eating, write these down and bring to next appointment   Follow up in: 3 months   Take care and let us know if you have any questions or concerns prior to your next visit.  Dr. Rosana Berger

## 2021-11-20 NOTE — Assessment & Plan Note (Addendum)
Stable, blood pressure at goal. Continue Amlodipine 5 mg, Losartan 100, Lopressor 25 mg daily.

## 2021-11-20 NOTE — Assessment & Plan Note (Signed)
Stable, on Crestor 20 mg.

## 2021-11-20 NOTE — Assessment & Plan Note (Signed)
Continue Crestor 20 mg, plan to recheck lipid panel at follow up.

## 2021-11-20 NOTE — Progress Notes (Signed)
Established Patient Office Visit  Subjective:  Patient ID: Marvin Granja., male    DOB: 05/31/1955  Age: 66 y.o. MRN: [id number]  CC:  Chief Complaint  Patient presents with   Follow-up   Diabetes   Hypertension   Hyperlipidemia    HPI Marvin STAGE Sr. presents for follow up on chronic medical conditions.  Diabetes, Type 2: -Last A1c 12/22 9.0% - 3/23 9.6% -Medications: Jardiance 25, Glipizide 10, Metformin 1000 twice daily, Trulicity increased to 3.0 mg weekly at [facility] (takes on Saturdays) -Patient is compliant with the above medications and reports no side effects.  -Checking BG at home: doesn't check - has polyuria when sugars high -Diet: had been drinking sugary beverages but now only drinking water and unsweetened tea, however does not eat proper diet and eats out frequently  -Eye exam: 5/23 UTD -Microalbumin: 12/22 -Foot exam: due  -Statin: yes -PNA vaccine: Prevnar 23 in 2008, politely declines Prevnar 20 today -Denies symptoms of hypoglycemia, polydipsia, numbness extremities, foot ulcers/trauma. Polyuria getting better.    A.Fib: -Currently on Lopressor 25, had been on Eliquis 5 mg BID but ran out  -Compliant with above medications and denies adverse effects, denies abnormal bleeding.  -Following with Cardiology, note reviewed 07/31/21   Hypertension/OSA: -Medications: Amlodipine 5, Losartan 100, Lopressor 25  -Patient is compliant with above medications and reports no side effects. -Checking BP at home (average): not checking  -Denies any SOB, CP, vision changes, LE edema or symptoms of hypotension -Compliant with CPAP   HLD: -Medications: Crestor 20 mg -Patient is compliant with above medications and reports no side effects.  -Last lipid panel: 6/22: TC 121, HDL 46, Triglycerides 72, LDL 60  Lipid Panel     Component Value Date/Time   CHOL 121 10/16/2020 0846   TRIG 72 10/16/2020 0846   HDL 46 10/16/2020 0846   CHOLHDL 2.6 10/16/2020 0846   CHOLHDL  3.4 06/26/2018 0818   LDLCALC 60 10/16/2020 0846   LDLCALC 83 06/26/2018 0818   LABVLDL 15 10/16/2020 0846   GERD: -Currently on Pepcid OTC -Symptoms well controlled.    Seasonal Allergies/PND: -Using Flonase    Health Maintenance:  -Blood work up to date, recheck A1c  Past Medical History:  Diagnosis Date   Adrenal tumor 07/26/2018   Aortic atherosclerosis ([place])    a. 07/2018 CT chest: Atherosclerotic calcifications of the thoracic aorta.   Chest pain    a. 05/2014 Ex MV: no evidence of significant ischemia, GI uptake was noted, EF 51%, no ECG changes concerning for ischemia, normal study; b. 05/2015 Ex MV: No ischemia/infarct. EF 38% (2/2 atten artifact-->EF 55-60% by echo 05/2015).   Decreased libido    Diastolic dysfunction    a. 05/2015 Echo: EF 55-60%, mild LVH. Mild MR. Mildly dil LA/RV; b. 02/2021 Echo: EF 55-60%, no rwma, mild-mod LVH, GrI DD, nl RV size/fxn. No signif valvular dzs.   HTN (hypertension)    Hyperlipidemia    IDDM (insulin dependent diabetes mellitus)    Obesity    PAF (paroxysmal atrial fibrillation) ([place])    a. Dx 12/2020. CHA2DS2VASc = 2-3 (HTN/DM/Ao atherosclerosis); b. 02/2021 Zio: RSR, rare PACs/PVCs. No Afib or other significant arrhythmias.   Pharyngitis    Sleep apnea    uses CPAP    Past Surgical History:  Procedure Laterality Date   AV FISTULA REPAIR     CARDIAC CATHETERIZATION     [facility]    COLONOSCOPY  2013   COLONOSCOPY WITH PROPOFOL N/A  12/19/2020   Procedure: COLONOSCOPY WITH PROPOFOL;  Surgeon: [name], MD;  Location: [facility] ENDOSCOPY;  Service: Gastroenterology;  Laterality: N/A;    Family History  Problem Relation Age of Onset   Breast cancer Mother    Cancer Mother    Kidney failure Father    Diabetes Father    Alcohol abuse Father    Diabetes Sister    Other Daughter     Social History   Socioeconomic History   Marital status: Married    Spouse name: Glenda   Number of children: 3   Years of education: Not on file    Highest education level: Some college, no degree  Occupational History   Not on file  Tobacco Use   Smoking status: Former    Packs/day: 0.25    Years: 25.00    Total pack years: 6.25    Types: Cigarettes    Quit date: 05/17/1998    Years since quitting: 23.5   Smokeless tobacco: Never   Tobacco comments:    off and on - maybe 5 cigarettes  Vaping Use   Vaping Use: Never used  Substance and Sexual Activity   Alcohol use: No    Alcohol/week: 0.0 standard drinks of alcohol    Comment: previously but not currently   Drug use: No   Sexual activity: Yes    Partners: Female  Other Topics Concern   Not on file  Social History Narrative   Not on file   Social Determinants of Health   Financial Resource Strain: Low Risk  (10/16/2020)   Overall Financial Resource Strain (CARDIA)    Difficulty of Paying Living Expenses: Not hard at all  Food Insecurity: No Food Insecurity (10/16/2020)   Hunger Vital Sign    Worried About Running Out of Food in the Last Year: Never true    Ran Out of Food in the Last Year: Never true  Transportation Needs: No Transportation Needs (10/16/2020)   PRAPARE - Hydrologist (Medical): No    Lack of Transportation (Non-Medical): No  Physical Activity: Insufficiently Active (10/16/2020)   Exercise Vital Sign    Days of Exercise per Week: 2 days    Minutes of Exercise per Session: 30 min  Stress: No Stress Concern Present (10/16/2020)   [place]    Feeling of Stress : Only a little  Social Connections: Socially Integrated (10/16/2020)   Social Connection and Isolation Panel [NHANES]    Frequency of Communication with Friends and Family: Twice a week    Frequency of Social Gatherings with Friends and Family: Twice a week    Attends Religious Services: More than 4 times per year    Active Member of [employer] or Organizations: Yes    Attends Music therapist: More  than 4 times per year    Marital Status: Married  Human resources officer Violence: Not At Risk (10/16/2020)   Humiliation, Afraid, Rape, and Kick questionnaire    Fear of Current or Ex-Partner: No    Emotionally Abused: No    Physically Abused: No    Sexually Abused: No    Outpatient Medications Prior to Visit  Medication Sig Dispense Refill   amLODipine (NORVASC) 5 MG tablet Take 1 tablet (5 mg total) by mouth daily. 90 tablet 3   Cholecalciferol (VITAMIN D) 2000 UNITS tablet Take 2,000 Units by mouth daily.     Dulaglutide (TRULICITY) 3 IR/6.7EL SOPN Inject 3 mg  as directed once a week. 2 mL 3   ELIQUIS 5 MG TABS tablet TAKE 1 TABLET(5 MG) BY MOUTH TWICE DAILY 180 tablet 0   empagliflozin (JARDIANCE) 25 MG TABS tablet Take 1 tablet (25 mg total) by mouth daily. 90 tablet 1   Flaxseed, Linseed, (FLAXSEED OIL) 1000 MG CAPS Take 1,000 mg by mouth daily.      losartan (COZAAR) 100 MG tablet TAKE 1 TABLET(100 MG) BY MOUTH DAILY 90 tablet 1   metoprolol tartrate (LOPRESSOR) 25 MG tablet TAKE 1 TABLET(25 MG) BY MOUTH TWICE DAILY 60 tablet 4   Omega-3 Fatty Acids (FISH OIL) 1000 MG CAPS Take 1,000 mg by mouth daily.     traZODone (DESYREL) 100 MG tablet TAKE 1 TABLET(100 MG) BY MOUTH AT BEDTIME 90 tablet 0   valACYclovir (VALTREX) 1000 MG tablet Take 1 tablet (1,000 mg total) by mouth 3 (three) times daily. 21 tablet 0   vitamin B-12 (CYANOCOBALAMIN) 1000 MCG tablet Take 1,000 mcg by mouth daily.     glipiZIDE (GLUCOTROL) 10 MG tablet TAKE 1 TABLET BY MOUTH TWICE DAILY BEFORE A MEAL FOR HIGH SUGARS 180 tablet 1   metFORMIN (GLUCOPHAGE) 1000 MG tablet Take 1 tablet (1,000 mg total) by mouth 2 (two) times daily with a meal. 60 tablet 0   rosuvastatin (CRESTOR) 20 MG tablet TAKE 1 TABLET(20 MG) BY MOUTH AT BEDTIME 30 tablet 1   No facility-administered medications prior to visit.    Allergies  Allergen Reactions   Atorvastatin Other (See Comments)    Statins cause headaches and muscle aches     ROS Review of Systems  Constitutional:  Negative for chills and fever.  Eyes:  Negative for visual disturbance.  Respiratory:  Negative for cough and shortness of breath.   Cardiovascular:  Negative for chest pain and palpitations.  Neurological:  Negative for dizziness and headaches.      Objective:    Physical Exam Constitutional:      Appearance: Normal appearance.  HENT:     Head: Normocephalic and atraumatic.  Eyes:     Conjunctiva/sclera: Conjunctivae normal.  Cardiovascular:     Rate and Rhythm: Normal rate and regular rhythm.     Pulses:          Dorsalis pedis pulses are 2+ on the right side and 2+ on the left side.  Pulmonary:     Effort: Pulmonary effort is normal.     Breath sounds: Normal breath sounds.  Musculoskeletal:     Right shoulder: Crepitus present. No swelling. Decreased range of motion.     Left shoulder: Normal.     Right lower leg: No edema.     Left lower leg: No edema.     Right foot: Normal range of motion. No deformity, bunion, Charcot foot, foot drop or prominent metatarsal heads.     Left foot: Normal range of motion. No deformity, bunion, Charcot foot, foot drop or prominent metatarsal heads.     Comments: Pain to palpitation over deltoid   Feet:     Right foot:     Protective Sensation: 6 sites tested.  6 sites sensed.     Skin integrity: Skin integrity normal.     Toenail Condition: Right toenails are normal.     Left foot:     Protective Sensation: 6 sites tested.  6 sites sensed.     Skin integrity: Skin integrity normal.     Toenail Condition: Left toenails are normal.  Skin:  General: Skin is warm and dry.  Neurological:     General: No focal deficit present.     Mental Status: He is alert. Mental status is at baseline.  Psychiatric:        Mood and Affect: Mood normal.        Behavior: Behavior normal.     BP 128/76   Pulse 81   Temp 97.7 F (36.5 C) (Oral)   Resp 16   Ht 6' 1.5" (1.867 m)   Wt 259 lb 9.6 oz  (117.8 kg)   SpO2 94%   BMI 33.79 kg/m  Wt Readings from Last 3 Encounters:  11/20/21 259 lb 9.6 oz (117.8 kg)  07/31/21 260 lb (117.9 kg)  07/21/21 260 lb 1.6 oz (118 kg)     Health Maintenance Due  Topic Date Due   COVID-19 Vaccine (1) Never done   Zoster Vaccines- Shingrix (1 of 2) Never done    There are no preventive care reminders to display for this patient.  Lab Results  Component Value Date   TSH 1.920 01/05/2021   Lab Results  Component Value Date   WBC 6.3 03/13/2021   HGB 14.3 03/13/2021   HCT 44.3 03/13/2021   MCV 87 03/13/2021   PLT 242 03/13/2021   Lab Results  Component Value Date   NA 137 07/21/2021   K 4.6 07/21/2021   CO2 22 07/21/2021   GLUCOSE 136 (H) 07/21/2021   BUN 17 07/21/2021   CREATININE 1.27 07/21/2021   BILITOT 0.4 01/05/2021   ALKPHOS 81 01/05/2021   AST 17 01/05/2021   ALT 15 01/05/2021   PROT 7.0 01/05/2021   ALBUMIN 4.3 01/05/2021   CALCIUM 9.8 07/21/2021   ANIONGAP 10 04/29/2015   EGFR 63 07/21/2021   Lab Results  Component Value Date   CHOL 121 10/16/2020   Lab Results  Component Value Date   HDL 46 10/16/2020   Lab Results  Component Value Date   LDLCALC 60 10/16/2020   Lab Results  Component Value Date   TRIG 72 10/16/2020   Lab Results  Component Value Date   CHOLHDL 2.6 10/16/2020   Lab Results  Component Value Date   HGBA1C 8.7 (A) 11/20/2021      Assessment & Plan:   Problem List Items Addressed This Visit       Cardiovascular and Mediastinum   Benign essential HTN (Chronic)    Stable, blood pressure at goal. Continue Amlodipine 5 mg, Losartan 100, Lopressor 25 mg daily.             Atherosclerosis of abdominal aorta (HCC)    Stable, on Crestor 20 mg.       Relevant Medications   rosuvastatin (CRESTOR) 20 MG tablet   Paroxysmal atrial fibrillation (HCC)    Stable, on Lopressor 25 mg and Eliquis 5 mg BID. Following with Cardiology, note reviewed from 07/31/21. Asymptomatic.                Respiratory   OSA on CPAP    Stable, doing well with CPAP.        Digestive   GERD (gastroesophageal reflux disease)    Stable, controlled with over the counter Pepcid.         Endocrine   Uncontrolled type 2 diabetes mellitus with hyperglycemia (HCC) - Primary    A1c today slightly better at 8.7%. Currently medication optimized, on Jardiance 25, Glipizide 10, Metformin 7017 mg BID, Trulicity 3.0 weekly. Has not made any  improvements with diet, discussed that he is doing better but still not controlled. Plan to start checking blood sugars in the morning, kit sent to pharmacy. Plan to start insulin at next visit if A1c still not at goal. Follow up in 3 months. Foot exam today and refills today.      Relevant Medications   metFORMIN (GLUCOPHAGE) 1000 MG tablet   glipiZIDE (GLUCOTROL) 10 MG tablet      Other Relevant Orders   POCT HgB A1C (Completed)   HM Diabetes Foot Exam (Completed)     Other   Hyperlipidemia (Chronic)    Continue Crestor 20 mg, plan to recheck lipid panel at follow up. Refills today.      Relevant Medications   rosuvastatin (CRESTOR) 20 MG tablet    Follow-up: Return in about 3 months (around 02/20/2022).    [name], DO

## 2021-11-20 NOTE — Assessment & Plan Note (Signed)
Stable, on Lopressor 25 mg and Eliquis 5 mg BID. Following with Cardiology, note reviewed from 07/31/21. Asymptomatic.

## 2021-11-20 NOTE — Assessment & Plan Note (Signed)
A1c today slightly better at 8.7%. Currently medication optimized, on Jardiance 25, Glipizide 10, Metformin 0712 mg BID, Trulicity 3.0 weekly. Has not made any improvements with diet, discussed that he is doing better but still not controlled. Plan to start checking blood sugars in the morning, kit sent to pharmacy. Plan to start insulin at next visit if A1c still not at goal. Follow up in 3 months. Foot exam today.

## 2021-11-20 NOTE — Assessment & Plan Note (Signed)
Stable, doing well with CPAP.

## 2021-12-10 ENCOUNTER — Other Ambulatory Visit: Payer: Self-pay | Admitting: Internal Medicine

## 2021-12-11 NOTE — Telephone Encounter (Signed)
Requested Prescriptions  Pending Prescriptions Disp Refills  . ACCU-CHEK GUIDE test strip [Pharmacy Med Name: ACCU-CHEK GUIDE TEST STRIPS 100S] 100 strip 1    Sig: USE UP TO FOUR TIMES DAILY AS DIRECTED     Endocrinology: Diabetes - Testing Supplies Passed - 12/10/2021 11:12 AM      Passed - Valid encounter within last 12 months    Recent Outpatient Visits          3 weeks ago Uncontrolled type 2 diabetes mellitus with hyperglycemia [facility])   [facility] [name], DO   4 months ago Uncontrolled type 2 diabetes mellitus with hyperglycemia [facility])   [facility] [name], DO   7 months ago Uncontrolled type 2 diabetes mellitus with hyperglycemia [facility])   [place], DO   11 months ago Atrial fibrillation, new onset [facility])   [facility] Delsa Grana, PA-C   1 year ago Colon cancer screening   [place], NP      Future Appointments            In 1 month Fletcher Anon, Mertie Clause, MD [facility], [place]   In 2 months [name], [facility], [place]           . Accu-Chek Softclix Lancets lancets [Pharmacy Med Name: [place] 100 each 1    Sig: USE TO TEST BLOOD SUGAR UP TO FOUR TIMES DAILY AS DIRECTED     Endocrinology: Diabetes - Testing Supplies Passed - 12/10/2021 11:12 AM      Passed - Valid encounter within last 12 months    Recent Outpatient Visits          3 weeks ago Uncontrolled type 2 diabetes mellitus with hyperglycemia [facility])   [facility] [name], DO   4 months ago Uncontrolled type 2 diabetes mellitus with hyperglycemia [facility])   [facility] [name], DO   7 months ago Uncontrolled type 2 diabetes mellitus with hyperglycemia [facility])   [place], DO   11 months ago Atrial  fibrillation, new onset [facility])   [facility] Delsa Grana, PA-C   1 year ago Colon cancer screening   [place], NP      Future Appointments            In 1 month Fletcher Anon, Mertie Clause, MD [facility], [place]   In 2 months [name], [facility], [place]

## 2021-12-14 ENCOUNTER — Other Ambulatory Visit: Payer: Self-pay | Admitting: Internal Medicine

## 2021-12-14 DIAGNOSIS — I700 Atherosclerosis of aorta: Secondary | ICD-10-CM

## 2021-12-14 DIAGNOSIS — E782 Mixed hyperlipidemia: Secondary | ICD-10-CM

## 2021-12-15 ENCOUNTER — Other Ambulatory Visit: Payer: Self-pay

## 2021-12-15 DIAGNOSIS — E782 Mixed hyperlipidemia: Secondary | ICD-10-CM

## 2021-12-15 DIAGNOSIS — I700 Atherosclerosis of aorta: Secondary | ICD-10-CM

## 2021-12-15 NOTE — Telephone Encounter (Signed)
Requested medication (s) are due for refill today:   Yes  Requested medication (s) are on the active medication list:   Yes  Future visit scheduled:   Yes   Last ordered: 11/20/2021 #90, 1 refill  Returned because lipid panel is due per protocol.     Requested Prescriptions  Pending Prescriptions Disp Refills   rosuvastatin (CRESTOR) 20 MG tablet [Pharmacy Med Name: ROSUVASTATIN '20MG'$  TABLETS] 30 tablet     Sig: TAKE 1 TABLET(20 MG) BY MOUTH AT BEDTIME     Cardiovascular:  Antilipid - Statins 2 Failed - 12/14/2021 11:20 AM      Failed - Lipid Panel in normal range within the last 12 months    Cholesterol, Total  Date Value Ref Range Status  10/16/2020 121 100 - 199 mg/dL Final   LDL Cholesterol (Calc)  Date Value Ref Range Status  06/26/2018 83 mg/dL (calc) Final    Comment:    Reference range: <100 . Desirable range <100 mg/dL for primary prevention;   <70 mg/dL for patients with CHD or diabetic patients  with > or = 2 CHD risk factors. Marland Kitchen LDL-C is now calculated using the Martin-Hopkins  calculation, which is a validated novel method providing  better accuracy than the Friedewald equation in the  estimation of LDL-C.  Cresenciano Genre et al. Annamaria Helling. 3664;403(47): 2061-2068  (http://education.[employer].com/faq/FAQ164)    LDL Chol Calc (NIH)  Date Value Ref Range Status  10/16/2020 60 0 - 99 mg/dL Final   HDL  Date Value Ref Range Status  10/16/2020 46 >39 mg/dL Final   Triglycerides  Date Value Ref Range Status  10/16/2020 72 0 - 149 mg/dL Final         Passed - Cr in normal range and within 360 days    Creat  Date Value Ref Range Status  06/26/2018 1.15 0.70 - 1.25 mg/dL Final    Comment:    For patients >62 years of age, the reference limit for Creatinine is approximately 13% higher for people identified as African-American. .    Creatinine, Ser  Date Value Ref Range Status  07/21/2021 1.27 0.76 - 1.27 mg/dL Final   Creatinine, Urine  Date Value Ref  Range Status  12/26/2015 113 20 - 370 mg/dL Final         Passed - Patient is not pregnant      Passed - Valid encounter within last 12 months    Recent Outpatient Visits           3 weeks ago Uncontrolled type 2 diabetes mellitus with hyperglycemia [facility])   [facility] [name], DO   4 months ago Uncontrolled type 2 diabetes mellitus with hyperglycemia [facility])   [facility] [name], DO   8 months ago Uncontrolled type 2 diabetes mellitus with hyperglycemia [facility])   [place], DO   11 months ago Atrial fibrillation, new onset [facility])   [facility] Delsa Grana, PA-C   1 year ago Colon cancer screening   [place], NP       Future Appointments             In 1 month Fletcher Anon, Mertie Clause, MD [facility], [place]   In 2 months [name], [facility], [place]

## 2021-12-17 ENCOUNTER — Ambulatory Visit: Payer: Self-pay | Admitting: *Deleted

## 2021-12-17 NOTE — Telephone Encounter (Signed)
Summary: Medication Clarification   Pt reached out to his pharmacy to have rosuvastatin (CRESTOR) 20 MG tablet refilled. The pharmacy stated that the medication could not be refilled yet. Request refused: Refill not appropriate (sent in 3 weeks ago). Please clarify with pt.     Call to pharmacy- spoke to [place]. Rx is on file and they will fill it for patient to pick up. Call to patient- left message Rx is at pharmacy , being filled now and can pick up today Reason for Disposition  [1] Prescription prescribed recently is not at pharmacy AND [2] triager has access to patient's EMR AND [3] prescription is recorded in the EMR    Call to pharmacy- verified Rx on file- they will fill  Answer Assessment - Initial Assessment Questions 1. DRUG NAME: "What medicine do you need to have refilled?"     Rosuvastatin 20 mg 2. REFILLS REMAINING: "How many refills are remaining?" (Note: The label on the medicine or pill bottle will show how many refills are remaining. If there are no refills remaining, then a renewal may be needed.)     11/20/21 #90 1RF 3. EXPIRATION DATE: "What is the expiration date?" (Note: The label states when the prescription will expire, and thus can no longer be refilled.)       4. PRESCRIBING HCP: "Who prescribed it?" Reason: If prescribed by specialist, call should be referred to that group.     PCP See notes- call to pharmacy- Rx is available for patient  Protocols used: Medication Refill and Renewal Call-A-AH

## 2022-01-08 ENCOUNTER — Other Ambulatory Visit: Payer: Self-pay | Admitting: Internal Medicine

## 2022-01-08 DIAGNOSIS — I480 Paroxysmal atrial fibrillation: Secondary | ICD-10-CM

## 2022-01-08 NOTE — Telephone Encounter (Signed)
Requested medication (s) are due for refill today: yes  Requested medication (s) are on the active medication list: yes  Last refill:  10/13/21 #180/0  Future visit scheduled: yes  Notes to clinic:  Unable to refill per protocol due to failed labs, no updated results.    Requested Prescriptions  Pending Prescriptions Disp Refills   ELIQUIS 5 MG TABS tablet [Pharmacy Med Name: ELIQUIS '5MG'$  TABLETS] 180 tablet 0    Sig: TAKE 1 TABLET(5 MG) BY MOUTH TWICE DAILY     Hematology:  Anticoagulants - apixaban Failed - 01/08/2022 11:02 AM      Failed - AST in normal range and within 360 days    AST  Date Value Ref Range Status  01/05/2021 17 0 - 40 IU/L Final         Failed - ALT in normal range and within 360 days    ALT  Date Value Ref Range Status  01/05/2021 15 0 - 44 IU/L Final         Passed - PLT in normal range and within 360 days    Platelets  Date Value Ref Range Status  03/13/2021 242 150 - 450 x10E3/uL Final         Passed - HGB in normal range and within 360 days    Hemoglobin  Date Value Ref Range Status  03/13/2021 14.3 13.0 - 17.7 g/dL Final         Passed - HCT in normal range and within 360 days    Hematocrit  Date Value Ref Range Status  03/13/2021 44.3 37.5 - 51.0 % Final         Passed - Cr in normal range and within 360 days    Creat  Date Value Ref Range Status  06/26/2018 1.15 0.70 - 1.25 mg/dL Final    Comment:    For patients >64 years of age, the reference limit for Creatinine is approximately 13% higher for people identified as African-American. .    Creatinine, Ser  Date Value Ref Range Status  07/21/2021 1.27 0.76 - 1.27 mg/dL Final   Creatinine, Urine  Date Value Ref Range Status  12/26/2015 113 20 - 370 mg/dL Final         Passed - Valid encounter within last 12 months    Recent Outpatient Visits           1 month ago Uncontrolled type 2 diabetes mellitus with hyperglycemia [facility])   [facility] [name], DO   5 months ago Uncontrolled type 2 diabetes mellitus with hyperglycemia [facility])   [facility] [name], DO   8 months ago Uncontrolled type 2 diabetes mellitus with hyperglycemia [facility])   [place], DO   1 year ago Atrial fibrillation, new onset [facility])   [facility] Delsa Grana, PA-C   1 year ago Colon cancer screening   [place], NP       Future Appointments             In 3 weeks Fletcher Anon, Mertie Clause, MD [facility], [place]   In 1 month [name], [facility], [place]

## 2022-01-09 ENCOUNTER — Other Ambulatory Visit: Payer: Self-pay | Admitting: Internal Medicine

## 2022-01-09 DIAGNOSIS — I480 Paroxysmal atrial fibrillation: Secondary | ICD-10-CM

## 2022-01-11 ENCOUNTER — Other Ambulatory Visit: Payer: Self-pay | Admitting: Internal Medicine

## 2022-01-11 ENCOUNTER — Other Ambulatory Visit: Payer: Self-pay | Admitting: Nurse Practitioner

## 2022-01-11 DIAGNOSIS — G4700 Insomnia, unspecified: Secondary | ICD-10-CM

## 2022-01-11 NOTE — Telephone Encounter (Signed)
Requested medication (s) are due for refill today: no  Requested medication (s) are on the active medication list: yes  Last refill:  01/11/22  Future visit scheduled: yes  Notes to clinic:  Unable to refill per protocol, last refill by provider 01/11/22 for 90 days, possible duplicate request.       Requested Prescriptions  Pending Prescriptions Disp Refills   ELIQUIS 5 MG TABS tablet [Pharmacy Med Name: ELIQUIS '5MG'$  TABLETS] 180 tablet 0    Sig: TAKE 1 TABLET(5 MG) BY MOUTH TWICE DAILY     Hematology:  Anticoagulants - apixaban Failed - 01/09/2022  9:02 AM      Failed - AST in normal range and within 360 days    AST  Date Value Ref Range Status  01/05/2021 17 0 - 40 IU/L Final         Failed - ALT in normal range and within 360 days    ALT  Date Value Ref Range Status  01/05/2021 15 0 - 44 IU/L Final         Passed - PLT in normal range and within 360 days    Platelets  Date Value Ref Range Status  03/13/2021 242 150 - 450 x10E3/uL Final         Passed - HGB in normal range and within 360 days    Hemoglobin  Date Value Ref Range Status  03/13/2021 14.3 13.0 - 17.7 g/dL Final         Passed - HCT in normal range and within 360 days    Hematocrit  Date Value Ref Range Status  03/13/2021 44.3 37.5 - 51.0 % Final         Passed - Cr in normal range and within 360 days    Creat  Date Value Ref Range Status  06/26/2018 1.15 0.70 - 1.25 mg/dL Final    Comment:    For patients >71 years of age, the reference limit for Creatinine is approximately 13% higher for people identified as African-American. .    Creatinine, Ser  Date Value Ref Range Status  07/21/2021 1.27 0.76 - 1.27 mg/dL Final   Creatinine, Urine  Date Value Ref Range Status  12/26/2015 113 20 - 370 mg/dL Final         Passed - Valid encounter within last 12 months    Recent Outpatient Visits           1 month ago Uncontrolled type 2 diabetes mellitus with hyperglycemia [facility])   [facility] [name], DO   5 months ago Uncontrolled type 2 diabetes mellitus with hyperglycemia [facility])   [facility] [name], DO   8 months ago Uncontrolled type 2 diabetes mellitus with hyperglycemia [facility])   [place], DO   1 year ago Atrial fibrillation, new onset [facility])   [facility] Delsa Grana, PA-C   1 year ago Colon cancer screening   [place], NP       Future Appointments             In 3 weeks Fletcher Anon, Mertie Clause, MD [place]. [place]   In 1 month [name], [facility], [facility]

## 2022-01-12 NOTE — Telephone Encounter (Signed)
Requested Prescriptions  Pending Prescriptions Disp Refills  . traZODone (DESYREL) 100 MG tablet [Pharmacy Med Name: TRAZODONE '100MG'$  TABLETS] 90 tablet 0    Sig: TAKE 1 TABLET(100 MG) BY MOUTH AT BEDTIME     Psychiatry: Antidepressants - Serotonin Modulator Passed - 01/11/2022  6:22 AM      Passed - Valid encounter within last 6 months    Recent Outpatient Visits          1 month ago Uncontrolled type 2 diabetes mellitus with hyperglycemia [facility])   [facility] [name], DO   5 months ago Uncontrolled type 2 diabetes mellitus with hyperglycemia [facility])   [facility] [name], DO   9 months ago Uncontrolled type 2 diabetes mellitus with hyperglycemia [facility])   [facility] [name], DO   1 year ago Atrial fibrillation, new onset [facility])   [facility] Delsa Grana, PA-C   1 year ago Colon cancer screening   [place], NP      Future Appointments            In 3 weeks Fletcher Anon, Mertie Clause, MD [place]. [place]   In 1 month [name], [facility], [facility]

## 2022-02-04 ENCOUNTER — Encounter: Payer: Self-pay | Admitting: Cardiovascular Disease

## 2022-02-04 ENCOUNTER — Other Ambulatory Visit: Payer: Self-pay

## 2022-02-04 ENCOUNTER — Ambulatory Visit
Payer: No Typology Code available for payment source | Attending: Cardiovascular Disease | Admitting: Cardiovascular Disease

## 2022-02-04 VITALS — BP 120/70 | HR 72 | Ht 73.5 in | Wt 259.1 lb

## 2022-02-04 DIAGNOSIS — G4733 Obstructive sleep apnea (adult) (pediatric): Secondary | ICD-10-CM | POA: Diagnosis not present

## 2022-02-04 DIAGNOSIS — I480 Paroxysmal atrial fibrillation: Secondary | ICD-10-CM

## 2022-02-04 DIAGNOSIS — I10 Essential (primary) hypertension: Secondary | ICD-10-CM | POA: Diagnosis not present

## 2022-02-04 DIAGNOSIS — E785 Hyperlipidemia, unspecified: Secondary | ICD-10-CM

## 2022-02-04 DIAGNOSIS — Z9989 Dependence on other enabling machines and devices: Secondary | ICD-10-CM

## 2022-02-04 DIAGNOSIS — E1165 Type 2 diabetes mellitus with hyperglycemia: Secondary | ICD-10-CM

## 2022-02-04 MED ORDER — APIXABAN 5 MG PO TABS: ORAL_TABLET | ORAL | 1 refills | Status: DC

## 2022-02-04 MED ORDER — METOPROLOL TARTRATE 25 MG PO TABS: ORAL_TABLET | ORAL | 3 refills | Status: DC

## 2022-02-04 NOTE — Telephone Encounter (Signed)
Prescription refill request for Eliquis received. Indication: afib  Last office visit: Arida, 02/04/2022 Scr: 1.27, 07/21/2021 Age: 66 yo  Weight: 117.5 kg   Refill sent.

## 2022-02-04 NOTE — Patient Instructions (Signed)
Medication Instructions:  Your physician recommends that you continue on your current medications as directed. Please refer to the Current Medication list given to you today.  *If you need a refill on your cardiac medications before your next appointment, please call your pharmacy*   Lab Work: None ordered  If you have labs (blood work) drawn today and your tests are completely normal, you will receive your results only by: MyChart Message (if you have MyChart) OR A paper copy in the mail If you have any lab test that is abnormal or we need to change your treatment, we will call you to review the results.   Testing/Procedures: None ordered   Follow-Up: At [facility], you and your health needs are our priority.  As part of our continuing mission to provide you with exceptional heart care, we have created designated Provider Care Teams.  These Care Teams include your primary Cardiologist (physician) and Advanced Practice Providers (APPs -  Physician Assistants and Nurse Practitioners) who all work together to provide you with the care you need, when you need it.  We recommend signing up for the patient portal called "MyChart".  Sign up information is provided on this After Visit Summary.  MyChart is used to connect with patients for Virtual Visits (Telemedicine).  Patients are able to view lab/test results, encounter notes, upcoming appointments, etc.  Non-urgent messages can be sent to your provider as well.   To learn more about what you can do with MyChart, go to [email or web address].    Your next appointment:   6 month(s)  The format for your next appointment:   In Person  Provider:   You may see Muhammad Arida, MD or one of the following Advanced Practice Providers on your designated Care Team:   Christopher Berge, NP Ryan Dunn, PA-C Cadence Furth, PA-C Sheri Hammock, NP   Other Instructions N/A  Important Information About Sugar       

## 2022-02-04 NOTE — Progress Notes (Signed)
Cardiology Office Note   Date:  02/04/2022   ID:  Marvin Sauger Sr., DOB [birth year], MRN [id number]  PCP:  [name], DO  Cardiologist:   Kathlyn Sacramento, MD   Chief Complaint  Patient presents with   Other    6 Month f/u no complaints today. Meds reviewed verbally with pt.      History of Present Illness: Marvin Duncan. is a 66 y.o. male who presents for a follow-up visit regarding paroxysmal atrial fibrillation. He has known history of type 2 diabetes, essential hypertension, hyperlipidemia, obesity and sleep apnea.  He had previous episodes of chest pain with normal stress testing most recently in 2017. He was diagnosed with atrial fibrillation in August 2022 when he presented for a routine colonoscopy.  He was asymptomatic.  He was noted subsequently to be in sinus rhythm.  Echocardiogram in October 2022 showed normal LV systolic function with mild LVH and grade 1 diastolic dysfunction.  Outpatient monitor showed no evidence of atrial fibrillation.  He has been doing well without chest pain, shortness of breath or palpitations.  No side effects with anticoagulation.   Past Medical History:  Diagnosis Date   Adrenal tumor 07/26/2018   Aortic atherosclerosis ([place])    a. 07/2018 CT chest: Atherosclerotic calcifications of the thoracic aorta.   Chest pain    a. 05/2014 Ex MV: no evidence of significant ischemia, GI uptake was noted, EF 51%, no ECG changes concerning for ischemia, normal study; b. 05/2015 Ex MV: No ischemia/infarct. EF 38% (2/2 atten artifact-->EF 55-60% by echo 05/2015).   Decreased libido    Diastolic dysfunction    a. 05/2015 Echo: EF 55-60%, mild LVH. Mild MR. Mildly dil LA/RV; b. 02/2021 Echo: EF 55-60%, no rwma, mild-mod LVH, GrI DD, nl RV size/fxn. No signif valvular dzs.   HTN (hypertension)    Hyperlipidemia    IDDM (insulin dependent diabetes mellitus)    Obesity    PAF (paroxysmal atrial fibrillation) ([place])    a. Dx 12/2020. CHA2DS2VASc = 2-3  (HTN/DM/Ao atherosclerosis); b. 02/2021 Zio: RSR, rare PACs/PVCs. No Afib or other significant arrhythmias.   Pharyngitis    Sleep apnea    uses CPAP    Past Surgical History:  Procedure Laterality Date   AV FISTULA REPAIR     CARDIAC CATHETERIZATION     [facility]    COLONOSCOPY  2013   COLONOSCOPY WITH PROPOFOL N/A 12/19/2020   Procedure: COLONOSCOPY WITH PROPOFOL;  Surgeon: [name], MD;  Location: [facility] ENDOSCOPY;  Service: Gastroenterology;  Laterality: N/A;     Current Outpatient Medications  Medication Sig Dispense Refill   ACCU-CHEK GUIDE test strip USE UP TO FOUR TIMES DAILY AS DIRECTED 100 strip 1   Accu-Chek Softclix Lancets lancets USE TO TEST BLOOD SUGAR UP TO FOUR TIMES DAILY AS DIRECTED 100 each 1   amLODipine (NORVASC) 5 MG tablet Take 1 tablet (5 mg total) by mouth daily. 90 tablet 3   blood glucose meter kit and supplies Dispense based on patient and insurance preference. Use up to four times daily as directed. (FOR ICD-10 E10.9, E11.9). 1 each 0   Cholecalciferol (VITAMIN D) 2000 UNITS tablet Take 2,000 Units by mouth daily.     Dulaglutide (TRULICITY) 3 QS/1.2KS SOPN Inject 3 mg as directed once a week. 2 mL 3   ELIQUIS 5 MG TABS tablet TAKE 1 TABLET(5 MG) BY MOUTH TWICE DAILY 180 tablet 0   empagliflozin (JARDIANCE) 25 MG TABS tablet Take 1  tablet (25 mg total) by mouth daily. 90 tablet 1   Flaxseed, Linseed, (FLAXSEED OIL) 1000 MG CAPS Take 1,000 mg by mouth daily.      glipiZIDE (GLUCOTROL) 10 MG tablet TAKE 1 TABLET BY MOUTH TWICE DAILY BEFORE A MEAL FOR HIGH SUGARS 180 tablet 1   losartan (COZAAR) 100 MG tablet TAKE 1 TABLET(100 MG) BY MOUTH DAILY 90 tablet 1   metFORMIN (GLUCOPHAGE) 1000 MG tablet Take 1 tablet (1,000 mg total) by mouth 2 (two) times daily with a meal. 180 tablet 1   Omega-3 Fatty Acids (FISH OIL) 1000 MG CAPS Take 1,000 mg by mouth daily.     rosuvastatin (CRESTOR) 20 MG tablet TAKE 1 TABLET(20 MG) BY MOUTH AT BEDTIME 90 tablet 1   traZODone  (DESYREL) 100 MG tablet TAKE 1 TABLET(100 MG) BY MOUTH AT BEDTIME 90 tablet 0   valACYclovir (VALTREX) 1000 MG tablet Take 1 tablet (1,000 mg total) by mouth 3 (three) times daily. 21 tablet 0   vitamin B-12 (CYANOCOBALAMIN) 1000 MCG tablet Take 1,000 mcg by mouth daily.     metoprolol tartrate (LOPRESSOR) 25 MG tablet TAKE 1 TABLET(25 MG) BY MOUTH TWICE DAILY 180 tablet 3   No current facility-administered medications for this visit.    Allergies:   Atorvastatin    Social History:  The patient  reports that he quit smoking about 23 years ago. His smoking use included cigarettes. He has a 6.25 pack-year smoking history. He has never used smokeless tobacco. He reports that he does not drink alcohol and does not use drugs.   Family History:  The patient's family history includes Alcohol abuse in his father; Breast cancer in his mother; Cancer in his mother; Diabetes in his father and sister; Kidney failure in his father; Other in his daughter.    ROS:  Please see the history of present illness.   Otherwise, review of systems are positive for none.   All other systems are reviewed and negative.    PHYSICAL EXAM: VS:  BP 120/70 (BP Location: Left Arm, Patient Position: Sitting, Cuff Size: Normal)   Pulse 72   Ht 6' 1.5" (1.867 m)   Wt 259 lb 2 oz (117.5 kg)   SpO2 98%   BMI 33.72 kg/m  , BMI Body mass index is 33.72 kg/m. GEN: Well nourished, well developed, in no acute distress  HEENT: normal  Neck: no JVD, carotid bruits, or masses Cardiac: RRR; no murmurs, rubs, or gallops,no edema  Respiratory:  clear to auscultation bilaterally, normal work of breathing GI: soft, nontender, nondistended, + BS MS: no deformity or atrophy  Skin: warm and dry, no rash Neuro:  Strength and sensation are intact Psych: euthymic mood, full affect   EKG:  EKG is ordered today. The ekg ordered today demonstrates normal sinus rhythm with first-degree AV block.   Recent Labs: 03/13/2021: Hemoglobin  14.3; Platelets 242 07/21/2021: BUN 17; Creatinine, Ser 1.27; Potassium 4.6; Sodium 137    Lipid Panel    Component Value Date/Time   CHOL 121 10/16/2020 0846   TRIG 72 10/16/2020 0846   HDL 46 10/16/2020 0846   CHOLHDL 2.6 10/16/2020 0846   CHOLHDL 3.4 06/26/2018 0818   LDLCALC 60 10/16/2020 0846   LDLCALC 83 06/26/2018 0818      Wt Readings from Last 3 Encounters:  02/04/22 259 lb 2 oz (117.5 kg)  11/20/21 259 lb 9.6 oz (117.8 kg)  07/31/21 260 lb (117.9 kg)  01/27/2021   11:10 AM  PAD Screen  Previous PAD dx? No  Previous surgical procedure? No  Pain with walking? Yes  Subsides with rest? No  Feet/toe relief with dangling? Yes  Painful, non-healing ulcers? No  Extremities discolored? No      ASSESSMENT AND PLAN:  1.  Paroxysmal atrial fibrillation: He is in sinus rhythm.  No documented episodes of atrial fibrillation since his initial diagnosis in August 2022.  We refilled metoprolol.  Continue anticoagulation with Eliquis given CHA2DS2-VASc score of 3.  2.  Essential hypertension: Blood pressure is well controlled on current medications.  3.  Hyperlipidemia: Continue rosuvastatin with a target LDL of less than 70 given that he is diabetic.  Most recent lipid profile showed an LDL of 60.  4.  Obstructive sleep apnea on CPAP.  5.  Type 2 diabetes: He is working with his primary care physician on improving glycemic control.  Most recent hemoglobin A1c was 8.7.    Disposition:   FU with me in 6 months  Signed,  Kathlyn Sacramento, MD  02/04/2022 9:08 AM    [place]

## 2022-02-22 NOTE — Progress Notes (Deleted)
Established Patient Office Visit  Subjective:  Patient ID: Marvin Duncan., male    DOB: [birth year]  Age: 66 y.o. MRN: [id number]  CC:  No chief complaint on file.   HPI [name] presents for follow up on chronic medical conditions.  Diabetes, Type 2: -Last A1c 7/23 8.7 -Medications: Jardiance 25 mg, Glipizide 10 mg, Metformin 1000 mg twice daily, Trulicity increased to 3.0 mg weekly at [place] (takes on Saturdays) -Patient is compliant with the above medications and reports no side effects.  -Checking BG at home: doesn't check - has polyuria when sugars high -Diet: had been drinking sugary beverages but now only drinking water and unsweetened tea, however does not eat proper diet and eats out frequently  -Eye exam: 5/23 UTD -Microalbumin: 12/22 -Foot exam: due  -Statin: yes -PNA vaccine: Prevnar 23 in 2008, politely declines Prevnar 20 today -Denies symptoms of hypoglycemia, polydipsia, numbness extremities, foot ulcers/trauma. Polyuria getting better.    A.Fib: -Currently on Lopressor 25, had been on Eliquis 5 mg BID but ran out  -Compliant with above medications and denies adverse effects, denies abnormal bleeding.  -Following with Cardiology, note reviewed 07/31/21   Hypertension/OSA: -Medications: Amlodipine 5, Losartan 100, Lopressor 25  -Patient is compliant with above medications and reports no side effects. -Checking BP at home (average): not checking  -Denies any SOB, CP, vision changes, LE edema or symptoms of hypotension -Compliant with CPAP   HLD: -Medications: Crestor 20 mg -Patient is compliant with above medications and reports no side effects.  -Last lipid panel: 6/22: TC 121, HDL 46, Triglycerides 72, LDL 60  Lipid Panel     Component Value Date/Time   CHOL 121 10/16/2020 0846   TRIG 72 10/16/2020 0846   HDL 46 10/16/2020 0846   CHOLHDL 2.6 10/16/2020 0846   CHOLHDL 3.4 06/26/2018 0818   LDLCALC 60 10/16/2020 0846   LDLCALC 83 06/26/2018  0818   LABVLDL 15 10/16/2020 0846   GERD: -Currently on Pepcid OTC -Symptoms well controlled.    Seasonal Allergies/PND: -Using Flonase    Health Maintenance:  -Blood work up to date, recheck A1c  Past Medical History:  Diagnosis Date   Adrenal tumor 07/26/2018   Aortic atherosclerosis ([place])    a. 07/2018 CT chest: Atherosclerotic calcifications of the thoracic aorta.   Chest pain    a. 05/2014 Ex MV: no evidence of significant ischemia, GI uptake was noted, EF 51%, no ECG changes concerning for ischemia, normal study; b. 05/2015 Ex MV: No ischemia/infarct. EF 38% (2/2 atten artifact-->EF 55-60% by echo 05/2015).   Decreased libido    Diastolic dysfunction    a. 05/2015 Echo: EF 55-60%, mild LVH. Mild MR. Mildly dil LA/RV; b. 02/2021 Echo: EF 55-60%, no rwma, mild-mod LVH, GrI DD, nl RV size/fxn. No signif valvular dzs.   HTN (hypertension)    Hyperlipidemia    IDDM (insulin dependent diabetes mellitus)    Obesity    PAF (paroxysmal atrial fibrillation) ([place])    a. Dx 12/2020. CHA2DS2VASc = 2-3 (HTN/DM/Ao atherosclerosis); b. 02/2021 Zio: RSR, rare PACs/PVCs. No Afib or other significant arrhythmias.   Pharyngitis    Sleep apnea    uses CPAP    Past Surgical History:  Procedure Laterality Date   AV FISTULA REPAIR     CARDIAC CATHETERIZATION     [facility]    COLONOSCOPY  2013   COLONOSCOPY WITH PROPOFOL N/A 12/19/2020   Procedure: COLONOSCOPY WITH PROPOFOL;  Surgeon: [name], MD;  Location:  [facility] ENDOSCOPY;  Service: Gastroenterology;  Laterality: N/A;    Family History  Problem Relation Age of Onset   Breast cancer Mother    Cancer Mother    Kidney failure Father    Diabetes Father    Alcohol abuse Father    Diabetes Sister    Other Daughter     Social History   Socioeconomic History   Marital status: Married    Spouse name: Glenda   Number of children: 3   Years of education: Not on file   Highest education level: Some college, no degree  Occupational History    Not on file  Tobacco Use   Smoking status: Former    Packs/day: 0.25    Years: 25.00    Total pack years: 6.25    Types: Cigarettes    Quit date: 05/17/1998    Years since quitting: 23.7   Smokeless tobacco: Never   Tobacco comments:    off and on - maybe 5 cigarettes  Vaping Use   Vaping Use: Never used  Substance and Sexual Activity   Alcohol use: No    Alcohol/week: 0.0 standard drinks of alcohol    Comment: previously but not currently   Drug use: No   Sexual activity: Yes    Partners: Female  Other Topics Concern   Not on file  Social History Narrative   Not on file   Social Determinants of Health   Financial Resource Strain: Low Risk  (10/16/2020)   Overall Financial Resource Strain (CARDIA)    Difficulty of Paying Living Expenses: Not hard at all  Food Insecurity: No Food Insecurity (10/16/2020)   Hunger Vital Sign    Worried About Running Out of Food in the Last Year: Never true    Ran Out of Food in the Last Year: Never true  Transportation Needs: No Transportation Needs (10/16/2020)   PRAPARE - Hydrologist (Medical): No    Lack of Transportation (Non-Medical): No  Physical Activity: Insufficiently Active (10/16/2020)   Exercise Vital Sign    Days of Exercise per Week: 2 days    Minutes of Exercise per Session: 30 min  Stress: No Stress Concern Present (10/16/2020)   [place]    Feeling of Stress : Only a little  Social Connections: Socially Integrated (10/16/2020)   Social Connection and Isolation Panel [NHANES]    Frequency of Communication with Friends and Family: Twice a week    Frequency of Social Gatherings with Friends and Family: Twice a week    Attends Religious Services: More than 4 times per year    Active Member of [employer] or Organizations: Yes    Attends Music therapist: More than 4 times per year    Marital Status: Married  Human resources officer  Violence: Not At Risk (10/16/2020)   Humiliation, Afraid, Rape, and Kick questionnaire    Fear of Current or Ex-Partner: No    Emotionally Abused: No    Physically Abused: No    Sexually Abused: No    Outpatient Medications Prior to Visit  Medication Sig Dispense Refill   ACCU-CHEK GUIDE test strip USE UP TO FOUR TIMES DAILY AS DIRECTED 100 strip 1   Accu-Chek Softclix Lancets lancets USE TO TEST BLOOD SUGAR UP TO FOUR TIMES DAILY AS DIRECTED 100 each 1   amLODipine (NORVASC) 5 MG tablet Take 1 tablet (5 mg total) by mouth daily. 90 tablet 3  apixaban (ELIQUIS) 5 MG TABS tablet TAKE 1 TABLET(5 MG) BY MOUTH TWICE DAILY 180 tablet 1   blood glucose meter kit and supplies Dispense based on patient and insurance preference. Use up to four times daily as directed. (FOR ICD-10 E10.9, E11.9). 1 each 0   Cholecalciferol (VITAMIN D) 2000 UNITS tablet Take 2,000 Units by mouth daily.     Dulaglutide (TRULICITY) 3 AY/3.0ZS SOPN Inject 3 mg as directed once a week. 2 mL 3   empagliflozin (JARDIANCE) 25 MG TABS tablet Take 1 tablet (25 mg total) by mouth daily. 90 tablet 1   Flaxseed, Linseed, (FLAXSEED OIL) 1000 MG CAPS Take 1,000 mg by mouth daily.      glipiZIDE (GLUCOTROL) 10 MG tablet TAKE 1 TABLET BY MOUTH TWICE DAILY BEFORE A MEAL FOR HIGH SUGARS 180 tablet 1   losartan (COZAAR) 100 MG tablet TAKE 1 TABLET(100 MG) BY MOUTH DAILY 90 tablet 1   metFORMIN (GLUCOPHAGE) 1000 MG tablet Take 1 tablet (1,000 mg total) by mouth 2 (two) times daily with a meal. 180 tablet 1   metoprolol tartrate (LOPRESSOR) 25 MG tablet TAKE 1 TABLET(25 MG) BY MOUTH TWICE DAILY 180 tablet 3   Omega-3 Fatty Acids (FISH OIL) 1000 MG CAPS Take 1,000 mg by mouth daily.     rosuvastatin (CRESTOR) 20 MG tablet TAKE 1 TABLET(20 MG) BY MOUTH AT BEDTIME 90 tablet 1   traZODone (DESYREL) 100 MG tablet TAKE 1 TABLET(100 MG) BY MOUTH AT BEDTIME 90 tablet 0   valACYclovir (VALTREX) 1000 MG tablet Take 1 tablet (1,000 mg total) by mouth  3 (three) times daily. 21 tablet 0   vitamin B-12 (CYANOCOBALAMIN) 1000 MCG tablet Take 1,000 mcg by mouth daily.     No facility-administered medications prior to visit.    Allergies  Allergen Reactions   Atorvastatin Other (See Comments)    Statins cause headaches and muscle aches    ROS Review of Systems  Constitutional:  Negative for chills and fever.  Eyes:  Negative for visual disturbance.  Respiratory:  Negative for cough and shortness of breath.   Cardiovascular:  Negative for chest pain and palpitations.  Neurological:  Negative for dizziness and headaches.      Objective:    Physical Exam Constitutional:      Appearance: Normal appearance.  HENT:     Head: Normocephalic and atraumatic.  Eyes:     Conjunctiva/sclera: Conjunctivae normal.  Cardiovascular:     Rate and Rhythm: Normal rate and regular rhythm.     Pulses:          Dorsalis pedis pulses are 2+ on the right side and 2+ on the left side.  Pulmonary:     Effort: Pulmonary effort is normal.     Breath sounds: Normal breath sounds.  Musculoskeletal:     Right shoulder: Crepitus present. No swelling. Decreased range of motion.     Left shoulder: Normal.     Right lower leg: No edema.     Left lower leg: No edema.     Right foot: Normal range of motion. No deformity, bunion, Charcot foot, foot drop or prominent metatarsal heads.     Left foot: Normal range of motion. No deformity, bunion, Charcot foot, foot drop or prominent metatarsal heads.     Comments: Pain to palpitation over deltoid   Feet:     Right foot:     Protective Sensation: 6 sites tested.  6 sites sensed.     Skin integrity: Skin  integrity normal.     Toenail Condition: Right toenails are normal.     Left foot:     Protective Sensation: 6 sites tested.  6 sites sensed.     Skin integrity: Skin integrity normal.     Toenail Condition: Left toenails are normal.  Skin:    General: Skin is warm and dry.  Neurological:     General: No  focal deficit present.     Mental Status: He is alert. Mental status is at baseline.  Psychiatric:        Mood and Affect: Mood normal.        Behavior: Behavior normal.     There were no vitals taken for this visit. Wt Readings from Last 3 Encounters:  02/04/22 259 lb 2 oz (117.5 kg)  11/20/21 259 lb 9.6 oz (117.8 kg)  07/31/21 260 lb (117.9 kg)     Health Maintenance Due  Topic Date Due   COVID-19 Vaccine (1) Never done   Zoster Vaccines- Shingrix (1 of 2) Never done   INFLUENZA VACCINE  12/15/2021   Pneumonia Vaccine 6+ Years old (3 - PPSV23 or PCV20) 01/10/2022    There are no preventive care reminders to display for this patient.  Lab Results  Component Value Date   TSH 1.920 01/05/2021   Lab Results  Component Value Date   WBC 6.3 03/13/2021   HGB 14.3 03/13/2021   HCT 44.3 03/13/2021   MCV 87 03/13/2021   PLT 242 03/13/2021   Lab Results  Component Value Date   NA 137 07/21/2021   K 4.6 07/21/2021   CO2 22 07/21/2021   GLUCOSE 136 (H) 07/21/2021   BUN 17 07/21/2021   CREATININE 1.27 07/21/2021   BILITOT 0.4 01/05/2021   ALKPHOS 81 01/05/2021   AST 17 01/05/2021   ALT 15 01/05/2021   PROT 7.0 01/05/2021   ALBUMIN 4.3 01/05/2021   CALCIUM 9.8 07/21/2021   ANIONGAP 10 04/29/2015   EGFR 63 07/21/2021   Lab Results  Component Value Date   CHOL 121 10/16/2020   Lab Results  Component Value Date   HDL 46 10/16/2020   Lab Results  Component Value Date   LDLCALC 60 10/16/2020   Lab Results  Component Value Date   TRIG 72 10/16/2020   Lab Results  Component Value Date   CHOLHDL 2.6 10/16/2020   Lab Results  Component Value Date   HGBA1C 8.7 (A) 11/20/2021      Assessment & Plan:   Problem List Items Addressed This Visit       Cardiovascular and Mediastinum   Benign essential HTN (Chronic)    Stable, blood pressure at goal. Continue Amlodipine 5 mg, Losartan 100, Lopressor 25 mg daily.             Atherosclerosis of abdominal  aorta (HCC)    Stable, on Crestor 20 mg.       Relevant Medications   rosuvastatin (CRESTOR) 20 MG tablet   Paroxysmal atrial fibrillation (HCC)    Stable, on Lopressor 25 mg and Eliquis 5 mg BID. Following with Cardiology, note reviewed from 07/31/21. Asymptomatic.               Respiratory   OSA on CPAP    Stable, doing well with CPAP.        Digestive   GERD (gastroesophageal reflux disease)    Stable, controlled with over the counter Pepcid.         Endocrine  Uncontrolled type 2 diabetes mellitus with hyperglycemia (HCC) - Primary    A1c today slightly better at 8.7%. Currently medication optimized, on Jardiance 25, Glipizide 10, Metformin 2341 mg BID, Trulicity 3.0 weekly. Has not made any improvements with diet, discussed that he is doing better but still not controlled. Plan to start checking blood sugars in the morning, kit sent to pharmacy. Plan to start insulin at next visit if A1c still not at goal. Follow up in 3 months. Foot exam today and refills today.      Relevant Medications   metFORMIN (GLUCOPHAGE) 1000 MG tablet   glipiZIDE (GLUCOTROL) 10 MG tablet      Other Relevant Orders   POCT HgB A1C (Completed)   [facility] Diabetes Foot Exam (Completed)     Other   Hyperlipidemia (Chronic)    Continue Crestor 20 mg, plan to recheck lipid panel at follow up. Refills today.      Relevant Medications   rosuvastatin (CRESTOR) 20 MG tablet    Follow-up: No follow-ups on file.    [name], DO

## 2022-02-23 ENCOUNTER — Ambulatory Visit: Payer: No Typology Code available for payment source | Admitting: Internal Medicine

## 2022-03-02 ENCOUNTER — Ambulatory Visit: Payer: No Typology Code available for payment source | Admitting: Internal Medicine

## 2022-03-02 ENCOUNTER — Encounter: Payer: Self-pay | Admitting: Internal Medicine

## 2022-03-02 VITALS — BP 118/72 | HR 69 | Temp 98.1°F | Resp 18 | Ht 73.5 in | Wt 257.9 lb

## 2022-03-02 DIAGNOSIS — E1165 Type 2 diabetes mellitus with hyperglycemia: Secondary | ICD-10-CM | POA: Diagnosis not present

## 2022-03-02 LAB — POCT GLYCOSYLATED HEMOGLOBIN (HGB A1C): Hemoglobin A1C: 9.2 % — AB (ref 4.0–5.6)

## 2022-03-02 MED ORDER — TRULICITY 4.5 MG/0.5ML ~~LOC~~ SOAJ: 4.5000 mg | SUBCUTANEOUS | 1 refills | Status: DC

## 2022-03-02 MED ORDER — PIOGLITAZONE HCL 15 MG PO TABS: 15.0000 mg | ORAL_TABLET | Freq: Every day | ORAL | 1 refills | Status: DC

## 2022-03-02 NOTE — Patient Instructions (Signed)
It was great seeing you today!  Plan discussed at today's visit: -A1c increased to 5.6% -Trulicity increased to 4.5 mg, added Actos 15 mg daily -Check fasting sugars and one random sugar and write these down  -Referral to diabetic educator placed  Follow up in: 1 month   Take care and let us know if you have any questions or concerns prior to your next visit.  Dr. Rosana Berger

## 2022-03-02 NOTE — Progress Notes (Signed)
Established Patient Office Visit  Subjective:  Patient ID: Marvin Duncan., male    DOB: 1955/10/27  Age: 66 y.o. MRN: [id number]  CC:  Chief Complaint  Patient presents with   Follow-up   Diabetes    HPI [name] presents for follow up on chronic medical conditions.  Diabetes, Type 2: -Last A1c 7/23 8.7 -Medications: Jardiance 25 mg, Glipizide 10 mg, Metformin 1000 mg twice daily, Trulicity increased to 3.0 mg weekly at [place] (takes on Saturdays) -Patient is compliant with the above medications and reports no side effects.  -Checking BG at home: doesn't check - has polyuria when sugars high -Diet: had been drinking sugary beverages but now only drinking water and unsweetened tea, however does not eat proper diet and eats out frequently  -Eye exam: 5/23 UTD -Microalbumin: UTD 12/22 -Foot exam: UTD 7/23 -Statin: yes -PNA vaccine: Prevnar 23 in 2008, politely declines Prevnar 20 today -Denies symptoms of hypoglycemia, polydipsia, numbness extremities, foot ulcers/trauma. Polyuria getting better.    A.Fib: -Currently on Lopressor 25, had been on Eliquis 5 mg BID -Compliant with above medications and denies adverse effects, denies abnormal bleeding.  -Following with Cardiology, note reviewed 02/04/22   Hypertension/OSA: -Medications: Amlodipine 5, Losartan 100, Lopressor 25  -Patient is compliant with above medications and reports no side effects. -Checking BP at home (average): not checking  -Denies any SOB, CP, vision changes, LE edema or symptoms of hypotension -Compliant with CPAP   HLD: -Medications: Crestor 20 mg -Patient is compliant with above medications and reports no side effects.  -Last lipid panel: 6/22: TC 121, HDL 46, Triglycerides 72, LDL 60  Lipid Panel     Component Value Date/Time   CHOL 121 10/16/2020 0846   TRIG 72 10/16/2020 0846   HDL 46 10/16/2020 0846   CHOLHDL 2.6 10/16/2020 0846   CHOLHDL 3.4 06/26/2018 0818   LDLCALC 60 10/16/2020  0846   LDLCALC 83 06/26/2018 0818   LABVLDL 15 10/16/2020 0846   GERD: -Currently on Pepcid OTC -Symptoms well controlled.    Seasonal Allergies/PND: -Using Flonase    Health Maintenance:  -Blood work up to date, recheck A1c  Past Medical History:  Diagnosis Date   Adrenal tumor 07/26/2018   Aortic atherosclerosis ([place])    a. 07/2018 CT chest: Atherosclerotic calcifications of the thoracic aorta.   Chest pain    a. 05/2014 Ex MV: no evidence of significant ischemia, GI uptake was noted, EF 51%, no ECG changes concerning for ischemia, normal study; b. 05/2015 Ex MV: No ischemia/infarct. EF 38% (2/2 atten artifact-->EF 55-60% by echo 05/2015).   Decreased libido    Diastolic dysfunction    a. 05/2015 Echo: EF 55-60%, mild LVH. Mild MR. Mildly dil LA/RV; b. 02/2021 Echo: EF 55-60%, no rwma, mild-mod LVH, GrI DD, nl RV size/fxn. No signif valvular dzs.   HTN (hypertension)    Hyperlipidemia    IDDM (insulin dependent diabetes mellitus)    Obesity    PAF (paroxysmal atrial fibrillation) ([place])    a. Dx 12/2020. CHA2DS2VASc = 2-3 (HTN/DM/Ao atherosclerosis); b. 02/2021 Zio: RSR, rare PACs/PVCs. No Afib or other significant arrhythmias.   Pharyngitis    Sleep apnea    uses CPAP    Past Surgical History:  Procedure Laterality Date   AV FISTULA REPAIR     CARDIAC CATHETERIZATION     [facility]    COLONOSCOPY  2013   COLONOSCOPY WITH PROPOFOL N/A 12/19/2020   Procedure: COLONOSCOPY WITH PROPOFOL;  Surgeon:  [name], MD;  Location: [facility] ENDOSCOPY;  Service: Gastroenterology;  Laterality: N/A;    Family History  Problem Relation Age of Onset   Breast cancer Mother    Cancer Mother    Kidney failure Father    Diabetes Father    Alcohol abuse Father    Diabetes Sister    Other Daughter     Social History   Socioeconomic History   Marital status: Married    Spouse name: Glenda   Number of children: 3   Years of education: Not on file   Highest education level: Some college, no  degree  Occupational History   Not on file  Tobacco Use   Smoking status: Former    Packs/day: 0.25    Years: 25.00    Total pack years: 6.25    Types: Cigarettes    Quit date: 05/17/1998    Years since quitting: 23.8   Smokeless tobacco: Never   Tobacco comments:    off and on - maybe 5 cigarettes  Vaping Use   Vaping Use: Never used  Substance and Sexual Activity   Alcohol use: No    Alcohol/week: 0.0 standard drinks of alcohol    Comment: previously but not currently   Drug use: No   Sexual activity: Yes    Partners: Female  Other Topics Concern   Not on file  Social History Narrative   Not on file   Social Determinants of Health   Financial Resource Strain: Low Risk  (10/16/2020)   Overall Financial Resource Strain (CARDIA)    Difficulty of Paying Living Expenses: Not hard at all  Food Insecurity: No Food Insecurity (10/16/2020)   Hunger Vital Sign    Worried About Running Out of Food in the Last Year: Never true    Ran Out of Food in the Last Year: Never true  Transportation Needs: No Transportation Needs (10/16/2020)   PRAPARE - Hydrologist (Medical): No    Lack of Transportation (Non-Medical): No  Physical Activity: Insufficiently Active (10/16/2020)   Exercise Vital Sign    Days of Exercise per Week: 2 days    Minutes of Exercise per Session: 30 min  Stress: No Stress Concern Present (10/16/2020)   [place]    Feeling of Stress : Only a little  Social Connections: Socially Integrated (10/16/2020)   Social Connection and Isolation Panel [NHANES]    Frequency of Communication with Friends and Family: Twice a week    Frequency of Social Gatherings with Friends and Family: Twice a week    Attends Religious Services: More than 4 times per year    Active Member of [employer] or Organizations: Yes    Attends Music therapist: More than 4 times per year    Marital Status:  Married  Human resources officer Violence: Not At Risk (10/16/2020)   Humiliation, Afraid, Rape, and Kick questionnaire    Fear of Current or Ex-Partner: No    Emotionally Abused: No    Physically Abused: No    Sexually Abused: No    Outpatient Medications Prior to Visit  Medication Sig Dispense Refill   ACCU-CHEK GUIDE test strip USE UP TO FOUR TIMES DAILY AS DIRECTED 100 strip 1   Accu-Chek Softclix Lancets lancets USE TO TEST BLOOD SUGAR UP TO FOUR TIMES DAILY AS DIRECTED 100 each 1   amLODipine (NORVASC) 5 MG tablet Take 1 tablet (5 mg total) by mouth daily.  90 tablet 3   apixaban (ELIQUIS) 5 MG TABS tablet TAKE 1 TABLET(5 MG) BY MOUTH TWICE DAILY 180 tablet 1   blood glucose meter kit and supplies Dispense based on patient and insurance preference. Use up to four times daily as directed. (FOR ICD-10 E10.9, E11.9). 1 each 0   Cholecalciferol (VITAMIN D) 2000 UNITS tablet Take 2,000 Units by mouth daily.     Dulaglutide (TRULICITY) 3 VP/7.1GG SOPN Inject 3 mg as directed once a week. 2 mL 3   empagliflozin (JARDIANCE) 25 MG TABS tablet Take 1 tablet (25 mg total) by mouth daily. 90 tablet 1   Flaxseed, Linseed, (FLAXSEED OIL) 1000 MG CAPS Take 1,000 mg by mouth daily.      glipiZIDE (GLUCOTROL) 10 MG tablet TAKE 1 TABLET BY MOUTH TWICE DAILY BEFORE A MEAL FOR HIGH SUGARS 180 tablet 1   losartan (COZAAR) 100 MG tablet TAKE 1 TABLET(100 MG) BY MOUTH DAILY 90 tablet 1   metFORMIN (GLUCOPHAGE) 1000 MG tablet Take 1 tablet (1,000 mg total) by mouth 2 (two) times daily with a meal. 180 tablet 1   metoprolol tartrate (LOPRESSOR) 25 MG tablet TAKE 1 TABLET(25 MG) BY MOUTH TWICE DAILY 180 tablet 3   Omega-3 Fatty Acids (FISH OIL) 1000 MG CAPS Take 1,000 mg by mouth daily.     rosuvastatin (CRESTOR) 20 MG tablet TAKE 1 TABLET(20 MG) BY MOUTH AT BEDTIME 90 tablet 1   traZODone (DESYREL) 100 MG tablet TAKE 1 TABLET(100 MG) BY MOUTH AT BEDTIME 90 tablet 0   valACYclovir (VALTREX) 1000 MG tablet Take 1 tablet  (1,000 mg total) by mouth 3 (three) times daily. 21 tablet 0   vitamin B-12 (CYANOCOBALAMIN) 1000 MCG tablet Take 1,000 mcg by mouth daily.     No facility-administered medications prior to visit.    Allergies  Allergen Reactions   Atorvastatin Other (See Comments)    Statins cause headaches and muscle aches    ROS Review of Systems  Constitutional:  Negative for chills and fever.  Eyes:  Negative for visual disturbance.  Respiratory:  Negative for cough and shortness of breath.   Cardiovascular:  Negative for chest pain and palpitations.  Neurological:  Negative for dizziness and headaches.      Objective:    Physical Exam Constitutional:      Appearance: Normal appearance.  HENT:     Head: Normocephalic and atraumatic.  Eyes:     Conjunctiva/sclera: Conjunctivae normal.  Cardiovascular:     Rate and Rhythm: Normal rate and regular rhythm.  Pulmonary:     Effort: Pulmonary effort is normal.     Breath sounds: Normal breath sounds.  Musculoskeletal:     Right lower leg: No edema.     Left lower leg: No edema.  Skin:    General: Skin is warm and dry.  Neurological:     General: No focal deficit present.     Mental Status: He is alert. Mental status is at baseline.  Psychiatric:        Mood and Affect: Mood normal.        Behavior: Behavior normal.     BP 118/72   Pulse 69   Temp 98.1 F (36.7 C)   Resp 18   Ht 6' 1.5" (1.867 m)   Wt 257 lb 14.4 oz (117 kg)   SpO2 96%   BMI 33.56 kg/m  Wt Readings from Last 3 Encounters:  03/02/22 257 lb 14.4 oz (117 kg)  02/04/22 259 lb 2  oz (117.5 kg)  11/20/21 259 lb 9.6 oz (117.8 kg)     There are no preventive care reminders to display for this patient.   There are no preventive care reminders to display for this patient.  Lab Results  Component Value Date   TSH 1.920 01/05/2021   Lab Results  Component Value Date   WBC 6.3 03/13/2021   HGB 14.3 03/13/2021   HCT 44.3 03/13/2021   MCV 87 03/13/2021    PLT 242 03/13/2021   Lab Results  Component Value Date   NA 137 07/21/2021   K 4.6 07/21/2021   CO2 22 07/21/2021   GLUCOSE 136 (H) 07/21/2021   BUN 17 07/21/2021   CREATININE 1.27 07/21/2021   BILITOT 0.4 01/05/2021   ALKPHOS 81 01/05/2021   AST 17 01/05/2021   ALT 15 01/05/2021   PROT 7.0 01/05/2021   ALBUMIN 4.3 01/05/2021   CALCIUM 9.8 07/21/2021   ANIONGAP 10 04/29/2015   EGFR 63 07/21/2021   Lab Results  Component Value Date   CHOL 121 10/16/2020   Lab Results  Component Value Date   HDL 46 10/16/2020   Lab Results  Component Value Date   LDLCALC 60 10/16/2020   Lab Results  Component Value Date   TRIG 72 10/16/2020   Lab Results  Component Value Date   CHOLHDL 2.6 10/16/2020   Lab Results  Component Value Date   HGBA1C 8.7 (A) 11/20/2021      Assessment & Plan:   1. Uncontrolled type 2 diabetes mellitus with hyperglycemia ([place]): A1c worsening to 9.2% today. We discussed starting insulin however his insurance is not going to cover any long acting insulins. Plan to increase Trulicity further to 4.5 mg weekly and start Actos 15 mg. Referral to diabetic educator placed. Patient will start checking fasting and random sugars and work on decreasing sugar in the diet. Follow up here in 1 month.   - POCT HgB A1C - Ambulatory referral to diabetic education - Dulaglutide (TRULICITY) 4.5 KG/4.0NU SOPN; Inject 4.5 mg as directed once a week.  Dispense: 2 mL; Refill: 1 - pioglitazone (ACTOS) 15 MG tablet; Take 1 tablet (15 mg total) by mouth daily.  Dispense: 30 tablet; Refill: 1   Follow-up: Return in about 4 weeks (around 03/30/2022).    [name], DO

## 2022-03-03 ENCOUNTER — Other Ambulatory Visit: Payer: Self-pay | Admitting: Unknown Physician Specialty

## 2022-03-03 NOTE — Telephone Encounter (Signed)
Requested Prescriptions  Pending Prescriptions Disp Refills  . JARDIANCE 25 MG TABS tablet [Pharmacy Med Name: JARDIANCE 25MG TABLETS] 90 tablet 1    Sig: TAKE 1 TABLET(25 MG) BY MOUTH DAILY     Endocrinology:  Diabetes - SGLT2 Inhibitors Failed - 03/03/2022  6:20 AM      Failed - HBA1C is between 0 and 7.9 and within 180 days    Hemoglobin A1C  Date Value Ref Range Status  03/02/2022 9.2 (A) 4.0 - 5.6 % Final   HbA1c, POC (controlled diabetic range)  Date Value Ref Range Status  03/01/2019 6.5 0.0 - 7.0 % Final   Hgb A1c MFr Bld  Date Value Ref Range Status  07/21/2021 9.6 (H) 4.8 - 5.6 % Final    Comment:             Prediabetes: 5.7 - 6.4          Diabetes: >6.4          Glycemic control for adults with diabetes: <7.0          Passed - Cr in normal range and within 360 days    Creat  Date Value Ref Range Status  06/26/2018 1.15 0.70 - 1.25 mg/dL Final    Comment:    For patients >1 years of age, the reference limit for Creatinine is approximately 13% higher for people identified as African-American. .    Creatinine, Ser  Date Value Ref Range Status  07/21/2021 1.27 0.76 - 1.27 mg/dL Final   Creatinine, Urine  Date Value Ref Range Status  12/26/2015 113 20 - 370 mg/dL Final         Passed - eGFR in normal range and within 360 days    GFR, Est African American  Date Value Ref Range Status  06/26/2018 79 > OR = 60 mL/min/1.68m Final   GFR calc Af Amer  Date Value Ref Range Status  04/04/2020 79 >59 mL/min/1.73 Final    Comment:    **In accordance with recommendations from the NKF-ASN Task force,**   [facility] is in the process of updating its eGFR calculation to the   2021 CKD-EPI creatinine equation that estimates kidney function   without a race variable.    GFR, Est Non African American  Date Value Ref Range Status  06/26/2018 68 > OR = 60 mL/min/1.765mFinal   GFR calc non Af Amer  Date Value Ref Range Status  04/04/2020 68 >59 mL/min/1.73 Final    eGFR  Date Value Ref Range Status  07/21/2021 63 >59 mL/min/1.73 Final         Passed - Valid encounter within last 6 months    Recent Outpatient Visits          [facility]   3 months ago [facility]   7 months ago [facility]   10 months ago [facility]controlled type 2 diabetes mellitus with hyperglycemia [facility]   1 year ago [facility], [facility]      Future Appointments            In 1 month [place]  [facility], [place]   In 5 months Arida, Mertie Clause, MD [place]. [place]

## 2022-04-02 ENCOUNTER — Ambulatory Visit: Payer: No Typology Code available for payment source | Admitting: Internal Medicine

## 2022-04-26 ENCOUNTER — Encounter: Payer: No Typology Code available for payment source | Attending: Internal Medicine | Admitting: *Deleted

## 2022-04-26 ENCOUNTER — Encounter: Payer: Self-pay | Admitting: *Deleted

## 2022-04-26 VITALS — BP 140/76 | Ht 73.0 in | Wt 266.4 lb

## 2022-04-26 DIAGNOSIS — E1165 Type 2 diabetes mellitus with hyperglycemia: Secondary | ICD-10-CM | POA: Insufficient documentation

## 2022-04-26 DIAGNOSIS — Z713 Dietary counseling and surveillance: Secondary | ICD-10-CM | POA: Diagnosis not present

## 2022-04-26 NOTE — Patient Instructions (Signed)
Check blood sugars 1 x day before breakfast OR 2 hrs after supper 3-4 x week Bring blood sugar records to the next appointment  Exercise:  Begin walking  for   15  minutes   3  days a week  and gradually increase to 30 minutes 5 x week  Eat 3 meals day,   1-2  snacks a day Space meals 4-6 hours apart Don't skip meals - eat 1 protein and 1 carbohydrate serving Avoid sugar sweetened drinks (juices) Complete 3 Day Food Record and bring to next appt  Rotate injection sites  Return for appointment on:  Tuesday June 15, 2022 at 8:30 am with Freda Munro (nurse)

## 2022-04-26 NOTE — Progress Notes (Signed)
Diabetes Self-Management Education  Visit Type: First/Initial  Appt. Start Time: 1325 Appt. End Time: 1884  04/26/2022  [name], identified by name and date of birth, is a 66 y.o. male with a diagnosis of Diabetes: Type 2.   ASSESSMENT  Blood pressure (!) 140/76, height '6\' 1"'$  (1.854 m), weight 266 lb 6.4 oz (120.8 kg). Body mass index is 35.15 kg/m.   Diabetes Self-Management Education - 04/26/22 1514       Visit Information   Visit Type First/Initial      Initial Visit   Diabetes Type Type 2    Date Diagnosed 23 years    Are you currently following a meal plan? No    Are you taking your medications as prescribed? Yes      Health Coping   How would you rate your overall health? Good      Psychosocial Assessment   Patient Belief/Attitude about Diabetes Other (comment)   "stressful sometimes"   What is the hardest part about your diabetes right now, causing you the most concern, or is the most worrisome to you about your diabetes?   Making healty food and beverage choices;Being active    Self-care barriers None    Self-management support Doctor's office;Family    Patient Concerns Nutrition/Meal planning;Glycemic Control;Medication;Monitoring;Weight Control;Healthy Lifestyle    Special Needs None    Preferred Learning Style Visual    Learning Readiness Contemplating    How often do you need to have someone help you when you read instructions, pamphlets, or other written materials from your doctor or pharmacy? 1 - Never    What is the last grade level you completed in school? some college      Pre-Education Assessment   Patient understands the diabetes disease and treatment process. Needs Review    Patient understands incorporating nutritional management into lifestyle. Needs Instruction    Patient undertands incorporating physical activity into lifestyle. Needs Instruction    Patient understands using medications safely. Needs Review    Patient understands  monitoring blood glucose, interpreting and using results Needs Review    Patient understands prevention, detection, and treatment of acute complications. Needs Instruction    Patient understands prevention, detection, and treatment of chronic complications. Needs Review    Patient understands how to develop strategies to address psychosocial issues. Needs Review    Patient understands how to develop strategies to promote health/change behavior. Needs Review      Complications   Last HgB A1C per patient/outside source 9.2 %   03/02/2022   How often do you check your blood sugar? 0 times/day (not testing)   He has a glucometer but hasn't checked blood sugars in months. BG in the office today was 193 mg/dL at 2:05 pm - 1 1/2 hrs pp.   Have you had a dilated eye exam in the past 12 months? Yes    Have you had a dental exam in the past 12 months? Yes    Are you checking your feet? Yes    How many days per week are you checking your feet? 4      Dietary Intake   Breakfast Something quick - bagel with cream cheese or pastry like a cinnamon roll    Snack (morning) peanut butter crackers, popcorn    Lunch skips or eats a [place] and cheese sub    Dinner chicken, beef, fish, potatoes, peas, beans, corn, [place], rice, occasional pasta, grreen beans, broccoli, cauliflower, cabbage, salads, tomatoes, cuccumbers, carrots  Beverage(s) water, juice, unsweetened tea, coffee with cream, Gatorade zero      Activity / Exercise   Activity / Exercise Type ADL's      Patient Education   Previous Diabetes Education Yes (please comment)   2016 - here for classes   Disease Pathophysiology Explored patient's options for treatment of their diabetes    Healthy Eating Role of diet in the treatment of diabetes and the relationship between the three main macronutrients and blood glucose level;Food label reading, portion sizes and measuring food.;Reviewed blood glucose goals for pre and post meals and how to evaluate the  patients' food intake on their blood glucose level.;Meal timing in regards to the patients' current diabetes medication.    Being Active Role of exercise on diabetes management, blood pressure control and cardiac health.    Medications Taught/reviewed insulin/injectables, injection, site rotation, insulin/injectables storage and needle disposal.;Reviewed patients medication for diabetes, action, purpose, timing of dose and side effects.    Monitoring Purpose and frequency of SMBG.;Taught/discussed recording of test results and interpretation of SMBG.;Identified appropriate SMBG and/or A1C goals.    Chronic complications Relationship between chronic complications and blood glucose control    Diabetes Stress and Support Identified and addressed patients feelings and concerns about diabetes      Individualized Goals (developed by patient)   Reducing Risk Other (comment)   improve blood sugars, decrease medications, prevent diabetes complications, lose weight, lead a healthier lifestyle, become more fit     Outcomes   Expected Outcomes Demonstrated interest in learning. Expect positive outcomes    Future DMSE 4-6 wks         Individualized Plan for Diabetes Self-Management Training:   Learning Objective:  Patient will have a greater understanding of diabetes self-management. Patient education plan is to attend individual and/or group sessions per assessed needs and concerns.   Plan:   Patient Instructions  Check blood sugars 1 x day before breakfast OR 2 hrs after supper 3-4 x week Bring blood sugar records to the next appointment  Exercise:  Begin walking  for   15  minutes   3  days a week  and gradually increase to 30 minutes 5 x week  Eat 3 meals day,   1-2  snacks a day Space meals 4-6 hours apart Don't skip meals - eat 1 protein and 1 carbohydrate serving Avoid sugar sweetened drinks (juices) Complete 3 Day Food Record and bring to next appt  Rotate injection sites  Return for  appointment on:  Tuesday June 15, 2022 at 8:30 am with Freda Munro (nurse)  Expected Outcomes:  Demonstrated interest in learning. Expect positive outcomes  Education material provided:  General Meal Planning Guidelines Simple Meal Plan 3 Day Food Record Healthy Snack Choices (ADA)  If problems or questions, patient to contact team via:   Johny Drilling, RN, [place] [phone]  Future DSME appointment: 4-6 wks June 15, 2022 with this educator

## 2022-04-29 NOTE — Progress Notes (Signed)
Established Patient Office Visit  Subjective:  Patient ID: Marvin Tetrick., male    DOB: [birth year]  Age: 66 y.o. MRN: [id number]  CC:  Chief Complaint  Patient presents with   Follow-up   Diabetes    HPI Marvin WALGREN Sr. presents for follow up on chronic medical conditions.  Diabetes, Type 2: -Last A1c 10/23 9.2% -Medications: Jardiance 25 mg, Glipizide 10 mg, Metformin 1000 mg twice daily, Trulicity increased to 4.5 mg weekly at LOV (takes on Saturdays). Started on Actos 15 mg as well at [place], doing well with changes -Patient is compliant with the above medications and reports no side effects.  -Checking BG at home: 107-157 in the morning, not checking random sugars -Diet: working on just drinking water, stopped juice -Eye exam: 5/23 UTD -Microalbumin: UTD 12/22, due -Foot exam: UTD 7/23 -Statin: yes -PNA vaccine: Prevnar 23 in 2008, politely declines Prevnar 20 today -Denies symptoms of hypoglycemia, polydipsia, numbness extremities, foot ulcers/trauma. Polyuria getting better.    A.Fib: -Currently on Lopressor 25 mg, had been on Eliquis 5 mg BID -Compliant with above medications and denies adverse effects, denies abnormal bleeding.  -Following with Cardiology, note reviewed 02/04/22   Hypertension/OSA: -Medications: Amlodipine 5 mg, Losartan 100 mg, Lopressor 25 mg -Patient is compliant with above medications and reports no side effects. -Checking BP at home (average): not checking  -Denies any SOB, CP, vision changes, LE edema or symptoms of hypotension -Compliant with CPAP   HLD: -Medications: Crestor 20 mg -Patient is compliant with above medications and reports no side effects.  -Last lipid panel: 6/22: TC 121, HDL 46, Triglycerides 72, LDL 60  Lipid Panel     Component Value Date/Time   CHOL 121 10/16/2020 0846   TRIG 72 10/16/2020 0846   HDL 46 10/16/2020 0846   CHOLHDL 2.6 10/16/2020 0846   CHOLHDL 3.4 06/26/2018 0818   LDLCALC 60 10/16/2020 0846    LDLCALC 83 06/26/2018 0818   LABVLDL 15 10/16/2020 0846   GERD: -Currently on Pepcid OTC -Symptoms well controlled.    Seasonal Allergies/PND: -Using Flonase    Health Maintenance:  -Blood work will be due next month, uses [facility]  Past Medical History:  Diagnosis Date   Adrenal tumor 07/26/2018   Aortic atherosclerosis ([place])    a. 07/2018 CT chest: Atherosclerotic calcifications of the thoracic aorta.   Chest pain    a. 05/2014 Ex MV: no evidence of significant ischemia, GI uptake was noted, EF 51%, no ECG changes concerning for ischemia, normal study; b. 05/2015 Ex MV: No ischemia/infarct. EF 38% (2/2 atten artifact-->EF 55-60% by echo 05/2015).   Decreased libido    Diastolic dysfunction    a. 05/2015 Echo: EF 55-60%, mild LVH. Mild MR. Mildly dil LA/RV; b. 02/2021 Echo: EF 55-60%, no rwma, mild-mod LVH, GrI DD, nl RV size/fxn. No signif valvular dzs.   HTN (hypertension)    Hyperlipidemia    IDDM (insulin dependent diabetes mellitus)    Obesity    PAF (paroxysmal atrial fibrillation) ([place])    a. Dx 12/2020. CHA2DS2VASc = 2-3 (HTN/DM/Ao atherosclerosis); b. 02/2021 Zio: RSR, rare PACs/PVCs. No Afib or other significant arrhythmias.   Pharyngitis    Sleep apnea    uses CPAP    Past Surgical History:  Procedure Laterality Date   AV FISTULA REPAIR     CARDIAC CATHETERIZATION     [facility]    COLONOSCOPY  2013   COLONOSCOPY WITH PROPOFOL N/A 12/19/2020   Procedure: COLONOSCOPY WITH PROPOFOL;  Surgeon: [name], MD;  Location: [facility] ENDOSCOPY;  Service: Gastroenterology;  Laterality: N/A;    Family History  Problem Relation Age of Onset   Breast cancer Mother    Cancer Mother    Kidney failure Father    Diabetes Father    Alcohol abuse Father    Diabetes Sister    Diabetes Sister    Other Daughter     Social History   Socioeconomic History   Marital status: Married    Spouse name: Glenda   Number of children: 3   Years of education: Not on file   Highest education  level: Some college, no degree  Occupational History   Not on file  Tobacco Use   Smoking status: Former    Packs/day: 0.25    Years: 25.00    Total pack years: 6.25    Types: Cigarettes    Quit date: 05/17/1998    Years since quitting: 23.9   Smokeless tobacco: Never   Tobacco comments:    off and on - maybe 5 cigarettes  Vaping Use   Vaping Use: Never used  Substance and Sexual Activity   Alcohol use: No    Alcohol/week: 0.0 standard drinks of alcohol    Comment: previously but not currently   Drug use: No   Sexual activity: Yes    Partners: Female  Other Topics Concern   Not on file  Social History Narrative   Not on file   Social Determinants of Health   Financial Resource Strain: Low Risk  (10/16/2020)   Overall Financial Resource Strain (CARDIA)    Difficulty of Paying Living Expenses: Not hard at all  Food Insecurity: No Food Insecurity (10/16/2020)   Hunger Vital Sign    Worried About Running Out of Food in the Last Year: Never true    Ran Out of Food in the Last Year: Never true  Transportation Needs: No Transportation Needs (10/16/2020)   PRAPARE - Hydrologist (Medical): No    Lack of Transportation (Non-Medical): No  Physical Activity: Insufficiently Active (10/16/2020)   Exercise Vital Sign    Days of Exercise per Week: 2 days    Minutes of Exercise per Session: 30 min  Stress: No Stress Concern Present (10/16/2020)   [place]    Feeling of Stress : Only a little  Social Connections: Socially Integrated (10/16/2020)   Social Connection and Isolation Panel [NHANES]    Frequency of Communication with Friends and Family: Twice a week    Frequency of Social Gatherings with Friends and Family: Twice a week    Attends Religious Services: More than 4 times per year    Active Member of [employer] or Organizations: Yes    Attends Music therapist: More than 4 times per  year    Marital Status: Married  Human resources officer Violence: Not At Risk (10/16/2020)   Humiliation, Afraid, Rape, and Kick questionnaire    Fear of Current or Ex-Partner: No    Emotionally Abused: No    Physically Abused: No    Sexually Abused: No    Outpatient Medications Prior to Visit  Medication Sig Dispense Refill   ACCU-CHEK GUIDE test strip USE UP TO FOUR TIMES DAILY AS DIRECTED 100 strip 1   Accu-Chek Softclix Lancets lancets USE TO TEST BLOOD SUGAR UP TO FOUR TIMES DAILY AS DIRECTED 100 each 1   amLODipine (NORVASC) 5 MG tablet Take 1 tablet (  5 mg total) by mouth daily. 90 tablet 3   apixaban (ELIQUIS) 5 MG TABS tablet TAKE 1 TABLET(5 MG) BY MOUTH TWICE DAILY 180 tablet 1   aspirin EC 81 MG tablet Take 81 mg by mouth daily. Swallow whole.     blood glucose meter kit and supplies Dispense based on patient and insurance preference. Use up to four times daily as directed. (FOR ICD-10 E10.9, E11.9). 1 each 0   Cholecalciferol (VITAMIN D) 2000 UNITS tablet Take 2,000 Units by mouth daily.     Dulaglutide (TRULICITY) 4.5 FT/7.3UK SOPN Inject 4.5 mg as directed once a week. 2 mL 1   Flaxseed, Linseed, (FLAXSEED OIL) 1000 MG CAPS Take 1,000 mg by mouth daily.      glipiZIDE (GLUCOTROL) 10 MG tablet TAKE 1 TABLET BY MOUTH TWICE DAILY BEFORE A MEAL FOR HIGH SUGARS 180 tablet 1   JARDIANCE 25 MG TABS tablet TAKE 1 TABLET(25 MG) BY MOUTH DAILY 90 tablet 0   losartan (COZAAR) 100 MG tablet TAKE 1 TABLET(100 MG) BY MOUTH DAILY 90 tablet 1   metFORMIN (GLUCOPHAGE) 1000 MG tablet Take 1 tablet (1,000 mg total) by mouth 2 (two) times daily with a meal. 180 tablet 1   metoprolol tartrate (LOPRESSOR) 25 MG tablet TAKE 1 TABLET(25 MG) BY MOUTH TWICE DAILY 180 tablet 3   Omega-3 Fatty Acids (FISH OIL) 1000 MG CAPS Take 1,000 mg by mouth daily.     pioglitazone (ACTOS) 15 MG tablet Take 1 tablet (15 mg total) by mouth daily. 30 tablet 1   rosuvastatin (CRESTOR) 20 MG tablet TAKE 1 TABLET(20 MG) BY MOUTH  AT BEDTIME 90 tablet 1   traZODone (DESYREL) 100 MG tablet TAKE 1 TABLET(100 MG) BY MOUTH AT BEDTIME 90 tablet 0   vitamin B-12 (CYANOCOBALAMIN) 1000 MCG tablet Take 1,000 mcg by mouth daily.     No facility-administered medications prior to visit.    Allergies  Allergen Reactions   Atorvastatin Other (See Comments)    Statins cause headaches and muscle aches    ROS Review of Systems  Constitutional:  Negative for chills and fever.  Eyes:  Negative for visual disturbance.  Respiratory:  Negative for cough and shortness of breath.   Cardiovascular:  Negative for chest pain and palpitations.  Neurological:  Negative for dizziness and headaches.      Objective:    Physical Exam Constitutional:      Appearance: Normal appearance.  HENT:     Head: Normocephalic and atraumatic.  Eyes:     Conjunctiva/sclera: Conjunctivae normal.  Cardiovascular:     Rate and Rhythm: Normal rate and regular rhythm.  Pulmonary:     Effort: Pulmonary effort is normal.     Breath sounds: Normal breath sounds.  Musculoskeletal:     Right lower leg: No edema.     Left lower leg: No edema.  Skin:    General: Skin is warm and dry.  Neurological:     General: No focal deficit present.     Mental Status: He is alert. Mental status is at baseline.  Psychiatric:        Mood and Affect: Mood normal.        Behavior: Behavior normal.     BP 126/72   Pulse 86   Temp 97.9 F (36.6 C)   Resp 18   Ht 6' 1.5" (1.867 m)   Wt 262 lb 3.2 oz (118.9 kg)   SpO2 94%   BMI 34.12 kg/m  Wt Readings  from Last 3 Encounters:  04/26/22 266 lb 6.4 oz (120.8 kg)  03/02/22 257 lb 14.4 oz (117 kg)  02/04/22 259 lb 2 oz (117.5 kg)     Health Maintenance Due  Topic Date Due   Diabetic kidney evaluation - Urine ACR  04/17/2022     There are no preventive care reminders to display for this patient.  Lab Results  Component Value Date   TSH 1.920 01/05/2021   Lab Results  Component Value Date   WBC  6.3 03/13/2021   HGB 14.3 03/13/2021   HCT 44.3 03/13/2021   MCV 87 03/13/2021   PLT 242 03/13/2021   Lab Results  Component Value Date   NA 137 07/21/2021   K 4.6 07/21/2021   CO2 22 07/21/2021   GLUCOSE 136 (H) 07/21/2021   BUN 17 07/21/2021   CREATININE 1.27 07/21/2021   BILITOT 0.4 01/05/2021   ALKPHOS 81 01/05/2021   AST 17 01/05/2021   ALT 15 01/05/2021   PROT 7.0 01/05/2021   ALBUMIN 4.3 01/05/2021   CALCIUM 9.8 07/21/2021   ANIONGAP 10 04/29/2015   EGFR 63 07/21/2021   Lab Results  Component Value Date   CHOL 121 10/16/2020   Lab Results  Component Value Date   HDL 46 10/16/2020   Lab Results  Component Value Date   LDLCALC 60 10/16/2020   Lab Results  Component Value Date   TRIG 72 10/16/2020   Lab Results  Component Value Date   CHOLHDL 2.6 10/16/2020   Lab Results  Component Value Date   HGBA1C 9.2 (A) 03/02/2022      Assessment & Plan:   1. Uncontrolled type 2 diabetes mellitus with hyperglycemia ([place]): A1c due in one month, will order now so he can get labs and we can discuss at follow up. Continue to work on diet. Microalbumin due.  Follow-up in 2 months.  - HgB A1c - Urine Microalbumin w/creat. ratio - Dulaglutide (TRULICITY) 4.5 TK/2.4OX SOPN; Inject 4.5 mg as directed once a week.  Dispense: 2 mL; Refill: 2 - glipiZIDE (GLUCOTROL) 10 MG tablet; TAKE 1 TABLET BY MOUTH TWICE DAILY BEFORE A MEAL FOR HIGH SUGARS  Dispense: 180 tablet; Refill: 1 - empagliflozin (JARDIANCE) 25 MG TABS tablet; TAKE 1 TABLET(25 MG) BY MOUTH DAILY  Dispense: 90 tablet; Refill: 1 - metFORMIN (GLUCOPHAGE) 1000 MG tablet; Take 1 tablet (1,000 mg total) by mouth 2 (two) times daily with a meal.  Dispense: 180 tablet; Refill: 1 - pioglitazone (ACTOS) 15 MG tablet; Take 1 tablet (15 mg total) by mouth daily.  Dispense: 90 tablet; Refill: 1  2. Benign essential HTN: Chronic and at goal, continue amlodipine 5 mg, losartan 100 mg, Lopressor 25 mg.  Losartan refilled.  Due  for annual labs including CMP and CBC.  - Comprehensive Metabolic Panel (CMET) - CBC w/Diff/Platelet - losartan (COZAAR) 100 MG tablet; TAKE 1 TABLET(100 MG) BY MOUTH DAILY  Dispense: 90 tablet; Refill: 1  3. Mixed hyperlipidemia/Atherosclerosis of abdominal aorta ([place]): Fasting lipid panel due.  Continue Crestor 20 mg, refilled.  - Lipid Profile - rosuvastatin (CRESTOR) 20 MG tablet; TAKE 1 TABLET(20 MG) BY MOUTH AT BEDTIME  Dispense: 90 tablet; Refill: 1  4. Insomnia, unspecified type: Stable.  Continue trazodone 100 mg at night, refilled.  - traZODone (DESYREL) 100 MG tablet; TAKE 1 TABLET(100 MG) BY MOUTH AT BEDTIME  Dispense: 90 tablet; Refill: 1  5. Prostate cancer screening: Prostate cancer screening with above labs.  - PSA   Follow-up:  Return in about 2 months (around 07/01/2022).    [name], DO

## 2022-04-30 ENCOUNTER — Ambulatory Visit: Payer: No Typology Code available for payment source | Admitting: Internal Medicine

## 2022-04-30 ENCOUNTER — Encounter: Payer: Self-pay | Admitting: Internal Medicine

## 2022-04-30 VITALS — BP 126/72 | HR 86 | Temp 97.9°F | Resp 18 | Ht 73.5 in | Wt 262.2 lb

## 2022-04-30 DIAGNOSIS — I10 Essential (primary) hypertension: Secondary | ICD-10-CM | POA: Diagnosis not present

## 2022-04-30 DIAGNOSIS — Z125 Encounter for screening for malignant neoplasm of prostate: Secondary | ICD-10-CM

## 2022-04-30 DIAGNOSIS — E1165 Type 2 diabetes mellitus with hyperglycemia: Secondary | ICD-10-CM

## 2022-04-30 DIAGNOSIS — E782 Mixed hyperlipidemia: Secondary | ICD-10-CM | POA: Diagnosis not present

## 2022-04-30 DIAGNOSIS — G4700 Insomnia, unspecified: Secondary | ICD-10-CM

## 2022-04-30 DIAGNOSIS — I700 Atherosclerosis of aorta: Secondary | ICD-10-CM | POA: Diagnosis not present

## 2022-04-30 MED ORDER — METFORMIN HCL 1000 MG PO TABS: 1000.0000 mg | ORAL_TABLET | Freq: Two times a day (BID) | ORAL | 1 refills | Status: DC

## 2022-04-30 MED ORDER — ROSUVASTATIN CALCIUM 20 MG PO TABS: ORAL_TABLET | ORAL | 1 refills | Status: DC

## 2022-04-30 MED ORDER — EMPAGLIFLOZIN 25 MG PO TABS: ORAL_TABLET | ORAL | 1 refills | Status: DC

## 2022-04-30 MED ORDER — TRULICITY 4.5 MG/0.5ML ~~LOC~~ SOAJ: 4.5000 mg | SUBCUTANEOUS | 2 refills | Status: DC

## 2022-04-30 MED ORDER — GLIPIZIDE 10 MG PO TABS: ORAL_TABLET | ORAL | 1 refills | Status: DC

## 2022-04-30 MED ORDER — PIOGLITAZONE HCL 15 MG PO TABS: 15.0000 mg | ORAL_TABLET | Freq: Every day | ORAL | 1 refills | Status: DC

## 2022-04-30 MED ORDER — LOSARTAN POTASSIUM 100 MG PO TABS: ORAL_TABLET | ORAL | 1 refills | Status: DC

## 2022-04-30 MED ORDER — TRAZODONE HCL 100 MG PO TABS: ORAL_TABLET | ORAL | 1 refills | Status: DC

## 2022-04-30 NOTE — Patient Instructions (Signed)
It was great seeing you today!  Plan discussed at today's visit: -Blood work ordered today, go to [facility] after 1/15 for labs -Medications refilled   Follow up in: 2 months   Take care and let us know if you have any questions or concerns prior to your next visit.  Dr. Rosana Berger

## 2022-05-22 ENCOUNTER — Other Ambulatory Visit: Payer: Self-pay | Admitting: Internal Medicine

## 2022-05-22 DIAGNOSIS — E1165 Type 2 diabetes mellitus with hyperglycemia: Secondary | ICD-10-CM

## 2022-05-24 NOTE — Telephone Encounter (Signed)
Requested Prescriptions  Pending Prescriptions Disp Refills   TRULICITY 4.5 VF/6.4PP SOPN [Pharmacy Med Name: TRULICITY 4.'5MG'$ /0.5ML SDP 0.5ML] 2 mL 2    Sig: INJECT 4.5 MG UNDER THE SKIN ONCE A WEEK AS DIRECTED     Endocrinology:  Diabetes - GLP-1 Receptor Agonists Failed - 05/22/2022  9:26 AM      Failed - HBA1C is between 0 and 7.9 and within 180 days    Hemoglobin A1C  Date Value Ref Range Status  03/02/2022 9.2 (A) 4.0 - 5.6 % Final   HbA1c, POC (controlled diabetic range)  Date Value Ref Range Status  03/01/2019 6.5 0.0 - 7.0 % Final   Hgb A1c MFr Bld  Date Value Ref Range Status  07/21/2021 9.6 (H) 4.8 - 5.6 % Final    Comment:             Prediabetes: 5.7 - 6.4          Diabetes: >6.4          Glycemic control for adults with diabetes: <7.0          Passed - Valid encounter within last 6 months    Recent Outpatient Visits           3 weeks ago Uncontrolled type 2 diabetes mellitus with hyperglycemia [facility])   [facility] [name], DO   2 months ago Uncontrolled type 2 diabetes mellitus with hyperglycemia [facility])   [facility] [name], DO   6 months ago Uncontrolled type 2 diabetes mellitus with hyperglycemia [facility])   [facility] [name], DO   10 months ago Uncontrolled type 2 diabetes mellitus with hyperglycemia [facility])   [facility] [name], DO   1 year ago Uncontrolled type 2 diabetes mellitus with hyperglycemia [facility])   [facility] [name], DO       Future Appointments             In 1 month [name], DO [facility], [place]   In 2 months Fletcher Anon, Mertie Clause, MD [place]

## 2022-05-26 ENCOUNTER — Encounter: Payer: Self-pay | Admitting: Physician Assistant

## 2022-05-26 ENCOUNTER — Ambulatory Visit: Payer: No Typology Code available for payment source | Admitting: Physician Assistant

## 2022-05-26 VITALS — BP 132/78 | HR 100 | Temp 98.2°F | Resp 16 | Ht 73.5 in | Wt 252.7 lb

## 2022-05-26 DIAGNOSIS — R300 Dysuria: Secondary | ICD-10-CM | POA: Diagnosis not present

## 2022-05-26 DIAGNOSIS — N390 Urinary tract infection, site not specified: Secondary | ICD-10-CM

## 2022-05-26 DIAGNOSIS — R319 Hematuria, unspecified: Secondary | ICD-10-CM | POA: Diagnosis not present

## 2022-05-26 DIAGNOSIS — R8281 Pyuria: Secondary | ICD-10-CM | POA: Diagnosis not present

## 2022-05-26 LAB — POCT URINALYSIS DIPSTICK
Bilirubin, UA: NEGATIVE
Glucose, UA: POSITIVE — AB
Ketones, UA: NEGATIVE
Nitrite, UA: POSITIVE
Protein, UA: POSITIVE — AB
Spec Grav, UA: 1.02 (ref 1.010–1.025)
Urobilinogen, UA: 0.2 E.U./dL
pH, UA: 5 (ref 5.0–8.0)

## 2022-05-26 MED ORDER — SULFAMETHOXAZOLE-TRIMETHOPRIM 800-160 MG PO TABS: 1.0000 | ORAL_TABLET | Freq: Two times a day (BID) | ORAL | 0 refills | Status: AC

## 2022-05-26 MED ORDER — PHENAZOPYRIDINE HCL 200 MG PO TABS: 200.0000 mg | ORAL_TABLET | Freq: Three times a day (TID) | ORAL | 0 refills | Status: DC | PRN

## 2022-05-26 NOTE — Patient Instructions (Addendum)
Based on your symptoms and results of the urinalysis I believe you have a UTI I recommend the following:   I have sent in a script for Bactrim   Please finish the entire course of the antibiotic even if you are feeling better before it is completed unless you have a concerning reaction to it.  I am also sending in a script for Pyridium to help with the pain you have while urinating  Stay well hydrated (at least 75 oz of water per day) and avoid holding your urine  If you have any of the following please let us know: persistent symptoms, fever, trouble urinating or inability to urinate, confusion, flank pain.

## 2022-05-26 NOTE — Progress Notes (Unsigned)
Acute Office Visit   Patient: Marvin LINAREZ Sr.   DOB: [birth year]   67 y.o. Male  MRN: [id number] Visit Date: 05/26/2022  Today's healthcare provider: Dani Gobble Cipriano [place], PA-C  Introduced myself to the patient as a Journalist, newspaper and provided education on APPs in clinical practice.    Chief Complaint  Patient presents with   Dysuria    05/22/21 and unable to hold urine   Subjective    HPI HPI     Dysuria    Additional comments: 05/22/21 and unable to hold urine      Last edited by Salomon Fick, [place] on 05/26/2022  9:12 AM.      Concern for UTI  Onset: sudden Duration: 05/22/22 Associated symptoms: He reports dysuria, increased urinary frequency, urinary urgency and incontinence - states he has had to start wearing depends  Reports pain in suprapubic area  He reports malodorous urine, and darker color of urine  Fever: yes, tmax 100 He reports he took a COVID test and this was negative      Medications: Outpatient Medications Prior to Visit  Medication Sig   ACCU-CHEK GUIDE test strip USE UP TO FOUR TIMES DAILY AS DIRECTED   Accu-Chek Softclix Lancets lancets USE TO TEST BLOOD SUGAR UP TO FOUR TIMES DAILY AS DIRECTED   amLODipine (NORVASC) 5 MG tablet Take 1 tablet (5 mg total) by mouth daily.   apixaban (ELIQUIS) 5 MG TABS tablet TAKE 1 TABLET(5 MG) BY MOUTH TWICE DAILY   aspirin EC 81 MG tablet Take 81 mg by mouth daily. Swallow whole.   blood glucose meter kit and supplies Dispense based on patient and insurance preference. Use up to four times daily as directed. (FOR ICD-10 E10.9, E11.9).   Cholecalciferol (VITAMIN D) 2000 UNITS tablet Take 2,000 Units by mouth daily.   empagliflozin (JARDIANCE) 25 MG TABS tablet TAKE 1 TABLET(25 MG) BY MOUTH DAILY   Flaxseed, Linseed, (FLAXSEED OIL) 1000 MG CAPS Take 1,000 mg by mouth daily.    glipiZIDE (GLUCOTROL) 10 MG tablet TAKE 1 TABLET BY MOUTH TWICE DAILY BEFORE A MEAL FOR HIGH SUGARS   losartan (COZAAR) 100 MG tablet  TAKE 1 TABLET(100 MG) BY MOUTH DAILY   metFORMIN (GLUCOPHAGE) 1000 MG tablet Take 1 tablet (1,000 mg total) by mouth 2 (two) times daily with a meal.   metoprolol tartrate (LOPRESSOR) 25 MG tablet TAKE 1 TABLET(25 MG) BY MOUTH TWICE DAILY   Omega-3 Fatty Acids (FISH OIL) 1000 MG CAPS Take 1,000 mg by mouth daily.   pioglitazone (ACTOS) 15 MG tablet Take 1 tablet (15 mg total) by mouth daily.   rosuvastatin (CRESTOR) 20 MG tablet TAKE 1 TABLET(20 MG) BY MOUTH AT BEDTIME   traZODone (DESYREL) 100 MG tablet TAKE 1 TABLET(100 MG) BY MOUTH AT BEDTIME   TRULICITY 4.5 GT/3.6IW SOPN INJECT 4.5 MG UNDER THE SKIN ONCE A WEEK AS DIRECTED   vitamin B-12 (CYANOCOBALAMIN) 1000 MCG tablet Take 1,000 mcg by mouth daily.   No facility-administered medications prior to visit.    Review of Systems  Genitourinary:  Positive for dysuria, frequency and urgency. Negative for decreased urine volume, difficulty urinating and flank pain.    {Labs  Heme  Chem  Endocrine  Serology  Results Review (optional):23779}   Objective    BP 132/78   Pulse 100   Temp 98.2 F (36.8 C) (Oral)   Resp 16   Ht 6' 1.5" (1.867 m)   Wt 252  lb 11.2 oz (114.6 kg)   SpO2 95%   BMI 32.89 kg/m  {Show previous vital signs (optional):23777}  Physical Exam    Results for orders placed or performed in visit on 05/26/22  POCT urinalysis dipstick  Result Value Ref Range   Color, UA Dark Yellow    Clarity, UA Cloudy    Glucose, UA Positive (A) Negative   Bilirubin, UA Negative    Ketones, UA Negative    Spec Grav, UA 1.020 1.010 - 1.025   Blood, UA Large    pH, UA 5.0 5.0 - 8.0   Protein, UA Positive (A) Negative   Urobilinogen, UA 0.2 0.2 or 1.0 E.U./dL   Nitrite, UA Positive    Leukocytes, UA Moderate (2+) (A) Negative   Appearance Yellow    Odor Foul     Assessment & Plan      No follow-ups on file.

## 2022-05-29 LAB — URINE CULTURE
MICRO NUMBER:: 14413383
SPECIMEN QUALITY:: ADEQUATE

## 2022-05-31 MED ORDER — NITROFURANTOIN MONOHYD MACRO 100 MG PO CAPS: 100.0000 mg | ORAL_CAPSULE | Freq: Two times a day (BID) | ORAL | 0 refills | Status: AC

## 2022-05-31 NOTE — Addendum Note (Signed)
Addended by: Talitha Givens on: 05/31/2022 09:31 AM   Modules accepted: Orders

## 2022-06-11 ENCOUNTER — Telehealth: Payer: Self-pay | Admitting: Internal Medicine

## 2022-06-11 DIAGNOSIS — E1165 Type 2 diabetes mellitus with hyperglycemia: Secondary | ICD-10-CM

## 2022-06-11 NOTE — Telephone Encounter (Signed)
Refilled 04/30/2022 #[id number] 1 rf. Requested Prescriptions  Pending Prescriptions Disp Refills   JARDIANCE 25 MG TABS tablet [Pharmacy Med Name: JARDIANCE '25MG'$  TABLETS] 90 tablet 1    Sig: TAKE 1 TABLET(25 MG) BY MOUTH DAILY     Endocrinology:  Diabetes - SGLT2 Inhibitors Failed - 06/11/2022  3:38 AM      Failed - HBA1C is between 0 and 7.9 and within 180 days    Hemoglobin A1C  Date Value Ref Range Status  03/02/2022 9.2 (A) 4.0 - 5.6 % Final   HbA1c, POC (controlled diabetic range)  Date Value Ref Range Status  03/01/2019 6.5 0.0 - 7.0 % Final   Hgb A1c MFr Bld  Date Value Ref Range Status  07/21/2021 9.6 (H) 4.8 - 5.6 % Final    Comment:             Prediabetes: 5.7 - 6.4          Diabetes: >6.4          Glycemic control for adults with diabetes: <7.0          Passed - Cr in normal range and within 360 days    Creat  Date Value Ref Range Status  06/26/2018 1.15 0.70 - 1.25 mg/dL Final    Comment:    For patients >89 years of age, the reference limit for Creatinine is approximately 13% higher for people identified as African-American. .    Creatinine, Ser  Date Value Ref Range Status  07/21/2021 1.27 0.76 - 1.27 mg/dL Final   Creatinine, Urine  Date Value Ref Range Status  12/26/2015 113 20 - 370 mg/dL Final         Passed - eGFR in normal range and within 360 days    GFR, Est African American  Date Value Ref Range Status  06/26/2018 79 > OR = 60 mL/min/1.52m Final   GFR calc Af Amer  Date Value Ref Range Status  04/04/2020 79 >59 mL/min/1.73 Final    Comment:    **In accordance with recommendations from the NKF-ASN Task force,**   [facility] is in the process of updating its eGFR calculation to the   2021 CKD-EPI creatinine equation that estimates kidney function   without a race variable.    GFR, Est Non African American  Date Value Ref Range Status  06/26/2018 68 > OR = 60 mL/min/1.735mFinal   GFR calc non Af Amer  Date Value Ref Range Status   04/04/2020 68 >59 mL/min/1.73 Final   eGFR  Date Value Ref Range Status  07/21/2021 63 >59 mL/min/1.73 Final         Passed - Valid encounter within last 6 months    Recent Outpatient Visits           2 weeks ago Urinary tract infection with hematuria, site unspecified   [facility]   1 month [facility]   3 months ago [facility] type [facility]   6 months ago [facility] type [facility]   10 months ago [facility]controlled type 2 [facility] with [facility]       Future Appointments  In 2 weeks [name], [facility], [place]   In 1 month Fletcher Anon, Mertie Clause, MD [place] [facility]

## 2022-06-15 ENCOUNTER — Ambulatory Visit: Payer: No Typology Code available for payment source | Admitting: *Deleted

## 2022-06-18 ENCOUNTER — Telehealth: Payer: Self-pay | Admitting: Internal Medicine

## 2022-06-18 DIAGNOSIS — E782 Mixed hyperlipidemia: Secondary | ICD-10-CM

## 2022-06-18 DIAGNOSIS — I700 Atherosclerosis of aorta: Secondary | ICD-10-CM

## 2022-06-21 NOTE — Telephone Encounter (Signed)
Refilled 04/30/2022 #[id number] 1 rf. Requested Prescriptions  Pending Prescriptions Disp Refills   rosuvastatin (CRESTOR) 20 MG tablet [Pharmacy Med Name: ROSUVASTATIN '20MG'$  TABLETS] 90 tablet 1    Sig: TAKE 1 TABLET(20 MG) BY MOUTH AT BEDTIME     Cardiovascular:  Antilipid - Statins 2 Failed - 06/18/2022  8:36 PM      Failed - Lipid Panel in normal range within the last 12 months    Cholesterol, Total  Date Value Ref Range Status  10/16/2020 121 100 - 199 mg/dL Final   LDL Cholesterol (Calc)  Date Value Ref Range Status  06/26/2018 83 mg/dL (calc) Final    Comment:    Reference range: <100 . Desirable range <100 mg/dL for primary prevention;   <70 mg/dL for patients with CHD or diabetic patients  with > or = 2 CHD risk factors. Marland Kitchen LDL-C is now calculated using the Martin-Hopkins  calculation, which is a validated novel method providing  better accuracy than the Friedewald equation in the  estimation of LDL-C.  Cresenciano Genre et al. Annamaria Helling. 6073;710(62): 2061-2068  (http://education.[facility].com/faq/FAQ164)    LDL Chol Calc (NIH)  Date Value Ref Range Status  10/16/2020 60 0 - 99 mg/dL Final   HDL  Date Value Ref Range Status  10/16/2020 46 >39 mg/dL Final   Triglycerides  Date Value Ref Range Status  10/16/2020 72 0 - 149 mg/dL Final         Passed - Cr in normal range and within 360 days    Creat  Date Value Ref Range Status  06/26/2018 1.15 0.70 - 1.25 mg/dL Final    Comment:    For patients >61 years of age, the reference limit for Creatinine is approximately 13% higher for people identified as African-American. .    Creatinine, Ser  Date Value Ref Range Status  07/21/2021 1.27 0.76 - 1.27 mg/dL Final   Creatinine, Urine  Date Value Ref Range Status  12/26/2015 113 20 - 370 mg/dL Final         Passed - Patient is not pregnant      Passed - Valid encounter within last 12 months    Recent Outpatient Visits           3 weeks ago Urinary tract infection  with hematuria, site unspecified   [place], PA-C   1 month ago Uncontrolled type 2 diabetes mellitus with hyperglycemia [facility])   [facility] [name], DO   3 months ago Uncontrolled type 2 diabetes mellitus with hyperglycemia [facility])   [facility] [name], DO   7 months ago Uncontrolled type 2 diabetes mellitus with hyperglycemia [facility])   [facility] [name], DO   11 months ago Uncontrolled type 2 diabetes mellitus with hyperglycemia [facility])   [facility] [name], DO       Future Appointments             In 1 week [name], [facility], [place]   In 1 month Fletcher Anon, Mertie Clause, MD [place] [facility]

## 2022-06-27 NOTE — Progress Notes (Unsigned)
Established Patient Office Visit  Subjective:  Patient ID: Marvin Gonzalo., male    DOB: 06/07/1955  Age: 67 y.o. MRN: [id number]  CC:  No chief complaint on file.   HPI Marvin BENNINK Sr. presents for follow up on chronic medical conditions.  Diabetes, Type 2: -Last A1c 10/23 9.2% -Medications: Jardiance 25 mg, Glipizide 10 mg, Metformin 1000 mg twice daily, Trulicity increased to 4.5 mg weekly at LOV (takes on Saturdays). Started on Actos 15 mg as well at [place], doing well with changes -Patient is compliant with the above medications and reports no side effects.  -Checking BG at home: 107-157 in the morning, not checking random sugars -Diet: working on just drinking water, stopped juice -Eye exam: 5/23 UTD -Microalbumin: UTD 12/22, due -Foot exam: UTD 7/23 -Statin: yes -PNA vaccine: Prevnar 23 in 2008, politely declines Prevnar 20 today -Denies symptoms of hypoglycemia, polydipsia, numbness extremities, foot ulcers/trauma. Polyuria getting better.    A.Fib: -Currently on Lopressor 25 mg, had been on Eliquis 5 mg BID -Compliant with above medications and denies adverse effects, denies abnormal bleeding.  -Following with Cardiology, note reviewed 02/04/22   Hypertension/OSA: -Medications: Amlodipine 5 mg, Losartan 100 mg, Lopressor 25 mg -Patient is compliant with above medications and reports no side effects. -Checking BP at home (average): not checking  -Denies any SOB, CP, vision changes, LE edema or symptoms of hypotension -Compliant with CPAP   HLD: -Medications: Crestor 20 mg -Patient is compliant with above medications and reports no side effects.  -Last lipid panel: 6/22: TC 121, HDL 46, Triglycerides 72, LDL 60  Lipid Panel     Component Value Date/Time   CHOL 121 10/16/2020 0846   TRIG 72 10/16/2020 0846   HDL 46 10/16/2020 0846   CHOLHDL 2.6 10/16/2020 0846   CHOLHDL 3.4 06/26/2018 0818   LDLCALC 60 10/16/2020 0846   LDLCALC 83 06/26/2018 0818   LABVLDL  15 10/16/2020 0846   GERD: -Currently on Pepcid OTC -Symptoms well controlled.    Seasonal Allergies/PND: -Using Flonase    Health Maintenance:  -Blood work will be due next month, uses [facility]  Past Medical History:  Diagnosis Date   Adrenal tumor 07/26/2018   Aortic atherosclerosis ([place])    a. 07/2018 CT chest: Atherosclerotic calcifications of the thoracic aorta.   Chest pain    a. 05/2014 Ex MV: no evidence of significant ischemia, GI uptake was noted, EF 51%, no ECG changes concerning for ischemia, normal study; b. 05/2015 Ex MV: No ischemia/infarct. EF 38% (2/2 atten artifact-->EF 55-60% by echo 05/2015).   Decreased libido    Diastolic dysfunction    a. 05/2015 Echo: EF 55-60%, mild LVH. Mild MR. Mildly dil LA/RV; b. 02/2021 Echo: EF 55-60%, no rwma, mild-mod LVH, GrI DD, nl RV size/fxn. No signif valvular dzs.   HTN (hypertension)    Hyperlipidemia    IDDM (insulin dependent diabetes mellitus)    Obesity    PAF (paroxysmal atrial fibrillation) ([place])    a. Dx 12/2020. CHA2DS2VASc = 2-3 (HTN/DM/Ao atherosclerosis); b. 02/2021 Zio: RSR, rare PACs/PVCs. No Afib or other significant arrhythmias.   Pharyngitis    Sleep apnea    uses CPAP    Past Surgical History:  Procedure Laterality Date   AV FISTULA REPAIR     CARDIAC CATHETERIZATION     [facility]    COLONOSCOPY  2013   COLONOSCOPY WITH PROPOFOL N/A 12/19/2020   Procedure: COLONOSCOPY WITH PROPOFOL;  Surgeon: [name], MD;  Location: [facility]  ENDOSCOPY;  Service: Gastroenterology;  Laterality: N/A;    Family History  Problem Relation Age of Onset   Breast cancer Mother    Cancer Mother    Kidney failure Father    Diabetes Father    Alcohol abuse Father    Diabetes Sister    Diabetes Sister    Other Daughter     Social History   Socioeconomic History   Marital status: Married    Spouse name: Glenda   Number of children: 3   Years of education: Not on file   Highest education level: Some college, no degree   Occupational History   Not on file  Tobacco Use   Smoking status: Former    Packs/day: 0.25    Years: 25.00    Total pack years: 6.25    Types: Cigarettes    Quit date: 05/17/1998    Years since quitting: 24.1   Smokeless tobacco: Never   Tobacco comments:    off and on - maybe 5 cigarettes  Vaping Use   Vaping Use: Never used  Substance and Sexual Activity   Alcohol use: No    Alcohol/week: 0.0 standard drinks of alcohol    Comment: previously but not currently   Drug use: No   Sexual activity: Yes    Partners: Female  Other Topics Concern   Not on file  Social History Narrative   Not on file   Social Determinants of Health   Financial Resource Strain: Low Risk  (10/16/2020)   Overall Financial Resource Strain (CARDIA)    Difficulty of Paying Living Expenses: Not hard at all  Food Insecurity: No Food Insecurity (10/16/2020)   Hunger Vital Sign    Worried About Running Out of Food in the Last Year: Never true    Ran Out of Food in the Last Year: Never true  Transportation Needs: No Transportation Needs (10/16/2020)   PRAPARE - Hydrologist (Medical): No    Lack of Transportation (Non-Medical): No  Physical Activity: Insufficiently Active (10/16/2020)   Exercise Vital Sign    Days of Exercise per Week: 2 days    Minutes of Exercise per Session: 30 min  Stress: No Stress Concern Present (10/16/2020)   [place]    Feeling of Stress : Only a little  Social Connections: Socially Integrated (10/16/2020)   Social Connection and Isolation Panel [NHANES]    Frequency of Communication with Friends and Family: Twice a week    Frequency of Social Gatherings with Friends and Family: Twice a week    Attends Religious Services: More than 4 times per year    Active Member of [employer] or Organizations: Yes    Attends Music therapist: More than 4 times per year    Marital Status: Married   Human resources officer Violence: Not At Risk (10/16/2020)   Humiliation, Afraid, Rape, and Kick questionnaire    Fear of Current or Ex-Partner: No    Emotionally Abused: No    Physically Abused: No    Sexually Abused: No    Outpatient Medications Prior to Visit  Medication Sig Dispense Refill   ACCU-CHEK GUIDE test strip USE UP TO FOUR TIMES DAILY AS DIRECTED 100 strip 1   Accu-Chek Softclix Lancets lancets USE TO TEST BLOOD SUGAR UP TO FOUR TIMES DAILY AS DIRECTED 100 each 1   amLODipine (NORVASC) 5 MG tablet Take 1 tablet (5 mg total) by mouth daily. [place]  tablet 3   apixaban (ELIQUIS) 5 MG TABS tablet TAKE 1 TABLET(5 MG) BY MOUTH TWICE DAILY 180 tablet 1   aspirin EC 81 MG tablet Take 81 mg by mouth daily. Swallow whole.     blood glucose meter kit and supplies Dispense based on patient and insurance preference. Use up to four times daily as directed. (FOR ICD-10 E10.9, E11.9). 1 each 0   Cholecalciferol (VITAMIN D) 2000 UNITS tablet Take 2,000 Units by mouth daily.     empagliflozin (JARDIANCE) 25 MG TABS tablet TAKE 1 TABLET(25 MG) BY MOUTH DAILY 90 tablet 1   Flaxseed, Linseed, (FLAXSEED OIL) 1000 MG CAPS Take 1,000 mg by mouth daily.      glipiZIDE (GLUCOTROL) 10 MG tablet TAKE 1 TABLET BY MOUTH TWICE DAILY BEFORE A MEAL FOR HIGH SUGARS 180 tablet 1   losartan (COZAAR) 100 MG tablet TAKE 1 TABLET(100 MG) BY MOUTH DAILY 90 tablet 1   metFORMIN (GLUCOPHAGE) 1000 MG tablet Take 1 tablet (1,000 mg total) by mouth 2 (two) times daily with a meal. 180 tablet 1   metoprolol tartrate (LOPRESSOR) 25 MG tablet TAKE 1 TABLET(25 MG) BY MOUTH TWICE DAILY 180 tablet 3   Omega-3 Fatty Acids (FISH OIL) 1000 MG CAPS Take 1,000 mg by mouth daily.     phenazopyridine (PYRIDIUM) 200 MG tablet Take 1 tablet (200 mg total) by mouth 3 (three) times daily as needed for pain. 10 tablet 0   pioglitazone (ACTOS) 15 MG tablet Take 1 tablet (15 mg total) by mouth daily. 90 tablet 1   rosuvastatin (CRESTOR) 20 MG tablet  TAKE 1 TABLET(20 MG) BY MOUTH AT BEDTIME 90 tablet 1   traZODone (DESYREL) 100 MG tablet TAKE 1 TABLET(100 MG) BY MOUTH AT BEDTIME 90 tablet 1   TRULICITY 4.5 0000000 SOPN INJECT 4.5 MG UNDER THE SKIN ONCE A WEEK AS DIRECTED 2 mL 2   vitamin B-12 (CYANOCOBALAMIN) 1000 MCG tablet Take 1,000 mcg by mouth daily.     No facility-administered medications prior to visit.    Allergies  Allergen Reactions   Atorvastatin Other (See Comments)    Statins cause headaches and muscle aches    ROS Review of Systems  Constitutional:  Negative for chills and fever.  Eyes:  Negative for visual disturbance.  Respiratory:  Negative for cough and shortness of breath.   Cardiovascular:  Negative for chest pain and palpitations.  Neurological:  Negative for dizziness and headaches.      Objective:    Physical Exam Constitutional:      Appearance: Normal appearance.  HENT:     Head: Normocephalic and atraumatic.  Eyes:     Conjunctiva/sclera: Conjunctivae normal.  Cardiovascular:     Rate and Rhythm: Normal rate and regular rhythm.  Pulmonary:     Effort: Pulmonary effort is normal.     Breath sounds: Normal breath sounds.  Musculoskeletal:     Right lower leg: No edema.     Left lower leg: No edema.  Skin:    General: Skin is warm and dry.  Neurological:     General: No focal deficit present.     Mental Status: He is alert. Mental status is at baseline.  Psychiatric:        Mood and Affect: Mood normal.        Behavior: Behavior normal.     There were no vitals taken for this visit. Wt Readings from Last 3 Encounters:  05/26/22 252 lb 11.2 oz (114.6 kg)  04/30/22 262 lb 3.2 oz (118.9 kg)  04/26/22 266 lb 6.4 oz (120.8 kg)     Health Maintenance Due  Topic Date Due   COVID-19 Vaccine (1) Never done   Zoster Vaccines- Shingrix (1 of 2) Never done   Diabetic kidney evaluation - Urine ACR  04/17/2022   Diabetic kidney evaluation - eGFR measurement  07/22/2022     There are  no preventive care reminders to display for this patient.  Lab Results  Component Value Date   TSH 1.920 01/05/2021   Lab Results  Component Value Date   WBC 6.3 03/13/2021   HGB 14.3 03/13/2021   HCT 44.3 03/13/2021   MCV 87 03/13/2021   PLT 242 03/13/2021   Lab Results  Component Value Date   NA 137 07/21/2021   K 4.6 07/21/2021   CO2 22 07/21/2021   GLUCOSE 136 (H) 07/21/2021   BUN 17 07/21/2021   CREATININE 1.27 07/21/2021   BILITOT 0.4 01/05/2021   ALKPHOS 81 01/05/2021   AST 17 01/05/2021   ALT 15 01/05/2021   PROT 7.0 01/05/2021   ALBUMIN 4.3 01/05/2021   CALCIUM 9.8 07/21/2021   ANIONGAP 10 04/29/2015   EGFR 63 07/21/2021   Lab Results  Component Value Date   CHOL 121 10/16/2020   Lab Results  Component Value Date   HDL 46 10/16/2020   Lab Results  Component Value Date   LDLCALC 60 10/16/2020   Lab Results  Component Value Date   TRIG 72 10/16/2020   Lab Results  Component Value Date   CHOLHDL 2.6 10/16/2020   Lab Results  Component Value Date   HGBA1C 9.2 (A) 03/02/2022      Assessment & Plan:   1. Uncontrolled type 2 diabetes mellitus with hyperglycemia ([place]): A1c due in one month, will order now so he can get labs and we can discuss at follow up. Continue to work on diet. Microalbumin due.  Follow-up in 2 months.  - HgB A1c - Urine Microalbumin w/creat. ratio - Dulaglutide (TRULICITY) 4.5 0000000 SOPN; Inject 4.5 mg as directed once a week.  Dispense: 2 mL; Refill: 2 - glipiZIDE (GLUCOTROL) 10 MG tablet; TAKE 1 TABLET BY MOUTH TWICE DAILY BEFORE A MEAL FOR HIGH SUGARS  Dispense: 180 tablet; Refill: 1 - empagliflozin (JARDIANCE) 25 MG TABS tablet; TAKE 1 TABLET(25 MG) BY MOUTH DAILY  Dispense: 90 tablet; Refill: 1 - metFORMIN (GLUCOPHAGE) 1000 MG tablet; Take 1 tablet (1,000 mg total) by mouth 2 (two) times daily with a meal.  Dispense: 180 tablet; Refill: 1 - pioglitazone (ACTOS) 15 MG tablet; Take 1 tablet (15 mg total) by mouth daily.   Dispense: 90 tablet; Refill: 1  2. Benign essential HTN: Chronic and at goal, continue amlodipine 5 mg, losartan 100 mg, Lopressor 25 mg.  Losartan refilled.  Due for annual labs including CMP and CBC.  - Comprehensive Metabolic Panel (CMET) - CBC w/Diff/Platelet - losartan (COZAAR) 100 MG tablet; TAKE 1 TABLET(100 MG) BY MOUTH DAILY  Dispense: 90 tablet; Refill: 1  3. Mixed hyperlipidemia/Atherosclerosis of abdominal aorta ([place]): Fasting lipid panel due.  Continue Crestor 20 mg, refilled.  - Lipid Profile - rosuvastatin (CRESTOR) 20 MG tablet; TAKE 1 TABLET(20 MG) BY MOUTH AT BEDTIME  Dispense: 90 tablet; Refill: 1  4. Insomnia, unspecified type: Stable.  Continue trazodone 100 mg at night, refilled.  - traZODone (DESYREL) 100 MG tablet; TAKE 1 TABLET(100 MG) BY MOUTH AT BEDTIME  Dispense: 90 tablet; Refill: 1  5. Prostate  cancer screening: Prostate cancer screening with above labs.  - PSA   Follow-up: No follow-ups on file.    [name], DO

## 2022-06-28 ENCOUNTER — Encounter: Payer: Self-pay | Admitting: Internal Medicine

## 2022-06-28 ENCOUNTER — Ambulatory Visit: Payer: No Typology Code available for payment source | Admitting: Internal Medicine

## 2022-06-28 VITALS — BP 142/82 | HR 81 | Temp 97.6°F | Resp 18 | Ht 73.5 in | Wt 255.1 lb

## 2022-06-28 DIAGNOSIS — E1165 Type 2 diabetes mellitus with hyperglycemia: Secondary | ICD-10-CM

## 2022-06-28 DIAGNOSIS — R8290 Unspecified abnormal findings in urine: Secondary | ICD-10-CM

## 2022-06-28 DIAGNOSIS — N3000 Acute cystitis without hematuria: Secondary | ICD-10-CM | POA: Diagnosis not present

## 2022-06-28 LAB — POCT URINALYSIS DIPSTICK
Bilirubin, UA: NEGATIVE
Blood, UA: POSITIVE
Glucose, UA: POSITIVE — AB
Ketones, UA: NEGATIVE
Nitrite, UA: POSITIVE
Odor: NORMAL
Protein, UA: NEGATIVE
Spec Grav, UA: 1.02 (ref 1.010–1.025)
Urobilinogen, UA: 0.2 E.U./dL
pH, UA: 6 (ref 5.0–8.0)

## 2022-06-28 MED ORDER — AMOXICILLIN-POT CLAVULANATE 875-125 MG PO TABS: 1.0000 | ORAL_TABLET | Freq: Two times a day (BID) | ORAL | 0 refills | Status: AC

## 2022-06-28 NOTE — Patient Instructions (Signed)
It was great seeing you today!  Plan discussed at today's visit: -Blood work ordered today, results will be uploaded to MyChart.  -Urine test today as well, if abnormal we will stop the Jardiance  Follow up in: 3 months   Take care and let us know if you have any questions or concerns prior to your next visit.  Dr. Rosana Berger

## 2022-06-29 LAB — CBC WITH DIFFERENTIAL/PLATELET
Basophils Absolute: 0.1 10*3/uL (ref 0.0–0.2)
Basos: 1 %
EOS (ABSOLUTE): 0.1 10*3/uL (ref 0.0–0.4)
Eos: 1 %
Hematocrit: 39.8 % (ref 37.5–51.0)
Hemoglobin: 13.2 g/dL (ref 13.0–17.7)
Immature Grans (Abs): 0.1 10*3/uL (ref 0.0–0.1)
Immature Granulocytes: 1 %
Lymphocytes Absolute: 2.4 10*3/uL (ref 0.7–3.1)
Lymphs: 35 %
MCH: 27.9 pg (ref 26.6–33.0)
MCHC: 33.2 g/dL (ref 31.5–35.7)
MCV: 84 fL (ref 79–97)
Monocytes Absolute: 0.5 10*3/uL (ref 0.1–0.9)
Monocytes: 7 %
Neutrophils Absolute: 3.9 10*3/uL (ref 1.4–7.0)
Neutrophils: 55 %
Platelets: 302 10*3/uL (ref 150–450)
RBC: 4.73 x10E6/uL (ref 4.14–5.80)
RDW: 11.5 % — ABNORMAL LOW (ref 11.6–15.4)
WBC: 7.1 10*3/uL (ref 3.4–10.8)

## 2022-06-29 LAB — PSA: Prostate Specific Ag, Serum: 2.7 ng/mL (ref 0.0–4.0)

## 2022-06-29 LAB — LIPID PANEL
Chol/HDL Ratio: 2.7 ratio (ref 0.0–5.0)
Cholesterol, Total: 123 mg/dL (ref 100–199)
HDL: 46 mg/dL (ref >39)
LDL Chol Calc (NIH): 60 mg/dL (ref 0–99)
Triglycerides: 85 mg/dL (ref 0–149)
VLDL Cholesterol Cal: 17 mg/dL (ref 5–40)

## 2022-06-29 LAB — MICROALBUMIN / CREATININE URINE RATIO
Creatinine, Urine: 53.8 mg/dL
Microalb/Creat Ratio: 24 mg/g creat (ref 0–29)
Microalbumin, Urine: 13 ug/mL

## 2022-06-29 LAB — HEMOGLOBIN A1C
Est. average glucose Bld gHb Est-mCnc: 169 mg/dL
Hgb A1c MFr Bld: 7.5 % — ABNORMAL HIGH (ref 4.8–5.6)

## 2022-07-01 ENCOUNTER — Ambulatory Visit: Payer: No Typology Code available for payment source | Admitting: Internal Medicine

## 2022-07-01 LAB — URINE CULTURE
MICRO NUMBER:: 14552824
SPECIMEN QUALITY:: ADEQUATE

## 2022-07-12 ENCOUNTER — Encounter: Payer: Self-pay | Admitting: *Deleted

## 2022-07-15 NOTE — Progress Notes (Signed)
Established Patient Office Visit  Subjective:  Patient ID: Marvin Duncan., male    DOB: 04-02-1956  Age: 67 y.o. MRN: [id number]  CC:  Chief Complaint  Patient presents with   Urine Odor    HPI [name] presents for follow up urinary issues. Patient was originally diagnosed with e coli UTI in January, was treated with Bactrim but found to be resistant on culture and switched to [facility]. He then had another positive UA last month and was treated with Augmentin x 10 days. He had been on Jardiance but this was discontinued at last office visit. Today he states the day after he finished the antibiotic his symptoms started again. He has dysuria, increased urinary urgency/frequency. Denies hematuria but does have lower abdominal/pelvic pain. No penile discharge, back pain or flank pain. Pain sometimes radiates to the testicles. No fevers. He notes a strong odor associated with the urine.   Past Medical History:  Diagnosis Date   Adrenal tumor 07/26/2018   Aortic atherosclerosis ([name])    a. 07/2018 CT chest: Atherosclerotic calcifications of the thoracic aorta.   Chest pain    a. 05/2014 Ex MV: no evidence of significant ischemia, GI uptake was noted, EF 51%, no ECG changes concerning for ischemia, normal study; b. 05/2015 Ex MV: No ischemia/infarct. EF 38% (2/2 atten artifact-->EF 55-60% by echo 05/2015).   Decreased libido    Diastolic dysfunction    a. 05/2015 Echo: EF 55-60%, mild LVH. Mild MR. Mildly dil LA/RV; b. 02/2021 Echo: EF 55-60%, no rwma, mild-mod LVH, GrI DD, nl RV size/fxn. No signif valvular dzs.   HTN (hypertension)    Hyperlipidemia    IDDM (insulin dependent diabetes mellitus)    Obesity    PAF (paroxysmal atrial fibrillation) ([place])    a. Dx 12/2020. CHA2DS2VASc = 2-3 (HTN/DM/Ao atherosclerosis); b. 02/2021 Zio: RSR, rare PACs/PVCs. No Afib or other significant arrhythmias.   Pharyngitis    Sleep apnea    uses CPAP    Past Surgical History:  Procedure  Laterality Date   AV FISTULA REPAIR     CARDIAC CATHETERIZATION     [facility]    COLONOSCOPY  2013   COLONOSCOPY WITH PROPOFOL N/A 12/19/2020   Procedure: COLONOSCOPY WITH PROPOFOL;  Surgeon: [name], MD;  Location: [facility] ENDOSCOPY;  Service: Gastroenterology;  Laterality: N/A;    Family History  Problem Relation Age of Onset   Breast cancer Mother    Cancer Mother    Kidney failure Father    Diabetes Father    Alcohol abuse Father    Diabetes Sister    Diabetes Sister    Other Daughter     Social History   Socioeconomic History   Marital status: Married    Spouse name: Glenda   Number of children: 3   Years of education: Not on file   Highest education level: Some college, no degree  Occupational History   Not on file  Tobacco Use   Smoking status: Former    Packs/day: 0.25    Years: 25.00    Total pack years: 6.25    Types: Cigarettes    Quit date: 05/17/1998    Years since quitting: 24.1   Smokeless tobacco: Never   Tobacco comments:    off and on - maybe 5 cigarettes  Vaping Use   Vaping Use: Never used  Substance and Sexual Activity   Alcohol use: No    Alcohol/week: 0.0 standard drinks of alcohol  Comment: previously but not currently   Drug use: No   Sexual activity: Yes    Partners: Female  Other Topics Concern   Not on file  Social History Narrative   Not on file   Social Determinants of Health   Financial Resource Strain: Low Risk  (10/16/2020)   Overall Financial Resource Strain (CARDIA)    Difficulty of Paying Living Expenses: Not hard at all  Food Insecurity: No Food Insecurity (10/16/2020)   Hunger Vital Sign    Worried About Running Out of Food in the Last Year: Never true    [place] in the Last Year: Never true  Transportation Needs: No Transportation Needs (10/16/2020)   PRAPARE - Hydrologist (Medical): No    Lack of Transportation (Non-Medical): No  Physical Activity: Insufficiently Active (10/16/2020)    Exercise Vital Sign    Days of Exercise per Week: 2 days    Minutes of Exercise per Session: 30 min  Stress: No Stress Concern Present (10/16/2020)   [place]    Feeling of Stress : Only a little  Social Connections: Socially Integrated (10/16/2020)   Social Connection and Isolation Panel [NHANES]    Frequency of Communication with Friends and Family: Twice a week    Frequency of Social Gatherings with Friends and Family: Twice a week    Attends Religious Services: More than 4 times per year    Active Member of [employer] or Organizations: Yes    Attends Music therapist: More than 4 times per year    Marital Status: Married  Human resources officer Violence: Not At Risk (10/16/2020)   Humiliation, Afraid, Rape, and Kick questionnaire    Fear of Current or Ex-Partner: No    Emotionally Abused: No    Physically Abused: No    Sexually Abused: No    Outpatient Medications Prior to Visit  Medication Sig Dispense Refill   ACCU-CHEK GUIDE test strip USE UP TO FOUR TIMES DAILY AS DIRECTED 100 strip 1   Accu-Chek Softclix Lancets lancets USE TO TEST BLOOD SUGAR UP TO FOUR TIMES DAILY AS DIRECTED 100 each 1   amLODipine (NORVASC) 5 MG tablet Take 1 tablet (5 mg total) by mouth daily. 90 tablet 3   apixaban (ELIQUIS) 5 MG TABS tablet TAKE 1 TABLET(5 MG) BY MOUTH TWICE DAILY 180 tablet 1   aspirin EC 81 MG tablet Take 81 mg by mouth daily. Swallow whole.     blood glucose meter kit and supplies Dispense based on patient and insurance preference. Use up to four times daily as directed. (FOR ICD-10 E10.9, E11.9). 1 each 0   Cholecalciferol (VITAMIN D) 2000 UNITS tablet Take 2,000 Units by mouth daily.     Flaxseed, Linseed, (FLAXSEED OIL) 1000 MG CAPS Take 1,000 mg by mouth daily.      glipiZIDE (GLUCOTROL) 10 MG tablet TAKE 1 TABLET BY MOUTH TWICE DAILY BEFORE A MEAL FOR HIGH SUGARS 180 tablet 1   losartan (COZAAR) 100 MG tablet TAKE  1 TABLET(100 MG) BY MOUTH DAILY 90 tablet 1   metFORMIN (GLUCOPHAGE) 1000 MG tablet Take 1 tablet (1,000 mg total) by mouth 2 (two) times daily with a meal. 180 tablet 1   metoprolol tartrate (LOPRESSOR) 25 MG tablet TAKE 1 TABLET(25 MG) BY MOUTH TWICE DAILY 180 tablet 3   Omega-3 Fatty Acids (FISH OIL) 1000 MG CAPS Take 1,000 mg by mouth daily.  phenazopyridine (PYRIDIUM) 200 MG tablet Take 1 tablet (200 mg total) by mouth 3 (three) times daily as needed for pain. 10 tablet 0   pioglitazone (ACTOS) 15 MG tablet Take 1 tablet (15 mg total) by mouth daily. 90 tablet 1   rosuvastatin (CRESTOR) 20 MG tablet TAKE 1 TABLET(20 MG) BY MOUTH AT BEDTIME 90 tablet 1   traZODone (DESYREL) 100 MG tablet TAKE 1 TABLET(100 MG) BY MOUTH AT BEDTIME 90 tablet 1   TRULICITY 4.5 0000000 SOPN INJECT 4.5 MG UNDER THE SKIN ONCE A WEEK AS DIRECTED 2 mL 2   vitamin B-12 (CYANOCOBALAMIN) 1000 MCG tablet Take 1,000 mcg by mouth daily.     No facility-administered medications prior to visit.    Allergies  Allergen Reactions   Atorvastatin Other (See Comments)    Statins cause headaches and muscle aches    ROS Review of Systems  Constitutional:  Negative for chills and fever.  Eyes:  Negative for visual disturbance.  Respiratory:  Negative for cough and shortness of breath.   Cardiovascular:  Negative for chest pain and palpitations.  Gastrointestinal:  Positive for abdominal pain.  Genitourinary:  Positive for dysuria, frequency, testicular pain and urgency. Negative for flank pain, hematuria, penile discharge and penile pain.  Neurological:  Negative for dizziness and headaches.      Objective:    Physical Exam Constitutional:      Appearance: Normal appearance.  HENT:     Head: Normocephalic and atraumatic.  Eyes:     Conjunctiva/sclera: Conjunctivae normal.  Cardiovascular:     Rate and Rhythm: Normal rate and regular rhythm.  Pulmonary:     Effort: Pulmonary effort is normal.     Breath  sounds: Normal breath sounds.  Abdominal:     General: There is no distension.     Palpations: Abdomen is soft.     Tenderness: There is no abdominal tenderness. There is no right CVA tenderness or left CVA tenderness.  Skin:    General: Skin is warm and dry.  Neurological:     General: No focal deficit present.     Mental Status: He is alert. Mental status is at baseline.  Psychiatric:        Mood and Affect: Mood normal.        Behavior: Behavior normal.     There were no vitals taken for this visit. Wt Readings from Last 3 Encounters:  06/28/22 255 lb 1.6 oz (115.7 kg)  05/26/22 252 lb 11.2 oz (114.6 kg)  04/30/22 262 lb 3.2 oz (118.9 kg)     Health Maintenance Due  Topic Date Due   COVID-19 Vaccine (1) Never done   Diabetic kidney evaluation - eGFR measurement  07/22/2022     There are no preventive care reminders to display for this patient.  Lab Results  Component Value Date   TSH 1.920 01/05/2021   Lab Results  Component Value Date   WBC 7.1 06/28/2022   HGB 13.2 06/28/2022   HCT 39.8 06/28/2022   MCV 84 06/28/2022   PLT 302 06/28/2022   Lab Results  Component Value Date   NA 137 07/21/2021   K 4.6 07/21/2021   CO2 22 07/21/2021   GLUCOSE 136 (H) 07/21/2021   BUN 17 07/21/2021   CREATININE 1.27 07/21/2021   BILITOT 0.4 01/05/2021   ALKPHOS 81 01/05/2021   AST 17 01/05/2021   ALT 15 01/05/2021   PROT 7.0 01/05/2021   ALBUMIN 4.3 01/05/2021   CALCIUM 9.8 07/21/2021  ANIONGAP 10 04/29/2015   EGFR 63 07/21/2021   Lab Results  Component Value Date   CHOL 123 06/28/2022   Lab Results  Component Value Date   HDL 46 06/28/2022   Lab Results  Component Value Date   LDLCALC 60 06/28/2022   Lab Results  Component Value Date   TRIG 85 06/28/2022   Lab Results  Component Value Date   CHOLHDL 2.7 06/28/2022   Lab Results  Component Value Date   HGBA1C 7.5 (H) 06/28/2022      Assessment & Plan:   1. Acute prostatitis/Abnormal urine  odor: UA here again with leukocytes, nitrates. Patient treated appropriately with 2 antibiotic courses, symptoms initially resolve while on medication but returns immediately about completion of antibiotic course. Concerned for acute prostatitis with suprapubic pain radiating into testicle and failure for symptoms to resolve. Will treat with Cipro 500 mg BID x 4 weeks. Urine culture sent to confirm antibiotic sensitivities. Follow up scheduled in May. Can extended course for 6 weeks if needed.   - ciprofloxacin (CIPRO) 500 MG tablet; Take 1 tablet (500 mg total) by mouth 2 (two) times daily.  Dispense: 60 tablet; Refill: 0 - POCT Urinalysis Dipstick - CULTURE, URINE COMPREHENSIVE   Follow-up: Return for already scheduled .    [name], DO

## 2022-07-16 ENCOUNTER — Encounter: Payer: Self-pay | Admitting: Internal Medicine

## 2022-07-16 ENCOUNTER — Ambulatory Visit: Payer: No Typology Code available for payment source | Admitting: Internal Medicine

## 2022-07-16 VITALS — BP 138/78 | HR 82 | Resp 16 | Ht 73.0 in | Wt 255.0 lb

## 2022-07-16 DIAGNOSIS — R8290 Unspecified abnormal findings in urine: Secondary | ICD-10-CM | POA: Diagnosis not present

## 2022-07-16 DIAGNOSIS — N410 Acute prostatitis: Secondary | ICD-10-CM | POA: Diagnosis not present

## 2022-07-16 LAB — POCT URINALYSIS DIPSTICK
Appearance: NORMAL
Bilirubin, UA: NEGATIVE
Blood, UA: NEGATIVE
Glucose, UA: NEGATIVE
Ketones, UA: NEGATIVE
Protein, UA: NEGATIVE
Spec Grav, UA: 1.015 (ref 1.010–1.025)
Urobilinogen, UA: 0.2 E.U./dL
pH, UA: 6.5 (ref 5.0–8.0)

## 2022-07-16 MED ORDER — CIPROFLOXACIN HCL 500 MG PO TABS: 500.0000 mg | ORAL_TABLET | Freq: Two times a day (BID) | ORAL | 0 refills | Status: AC

## 2022-07-16 NOTE — Patient Instructions (Addendum)
It was great seeing you today!  Plan discussed at today's visit: -Urine culture sent today -Take antibiotic twice a day for 4 weeks, can extend to 6 weeks if needed -For post nasal drip, start nasal saline and Flonase (2 sprays on each side twice a day) and start an anti-histamine like Zyrtec, Allegra or Claritin - can get all of these over the counter    Follow up in:   Take care and let us know if you have any questions or concerns prior to your next visit.  Dr. Rosana Berger  Prostatitis  Prostatitis is swelling or inflammation of the prostate gland, also called the prostate. This gland is about 1.5 inches wide and 1 inch high, and it is involved in making semen. The prostate is located below a man's bladder, in front of the rectum. There are four types of prostatitis: Chronic prostatitis (CP), also called chronic pelvic pain syndrome (CPPS). This is the most common type of prostatitis. It is associated with increased muscle tone in the area between the hip bones (pelvic area), around the prostate. This type is also known as a pelvic floor disorder. Chronic bacterial prostatitis. This type usually results from an acute bacterial infection in the prostate gland that keeps coming back or has not been treated properly. The symptoms are less severe than those caused by acute bacterial prostatitis, which lasts a shorter time. Asymptomatic inflammatory prostatitis. This type does not have symptoms and does not need treatment. This is diagnosed when tests are done for other disorders of the urinary tract or reproductive tract. Acute bacterial prostatitis. This type starts quickly and results from an acute bacterial infection in the prostate gland. It is usually associated with a bladder infection, high fever, and chills. This is the least common type of prostatitis. What are the causes? Bacterial prostatitis is caused by an infection from bacteria. Chronic nonbacterial prostatitis may be caused  by: Factors related to the nervous system. This system includes the brain, spinal cord, and nerves. An autoimmune response. This happens when the body's disease-fighting system attacks healthy tissue in the body by mistake. Psychological factors. These have to do with how the mind works. The causes of the other types of prostatitis are usually not known. What are the signs or symptoms? Symptoms of this condition depend on the type of prostatitis you have. Acute bacterial prostatitis Symptoms may include: Pain or burning during urination. Frequent and sudden urges to urinate. Trouble starting to urinate. Fever. Chills. Pain in your muscles or joints, lower back, or lower abdomen. Other types of prostatitis Symptoms may include: Sudden urges to urinate, or urinating often. Trouble starting to urinate. Weak urine stream. Dribbling after urination. Discharge coming from the penis. Pain in the testicles, the penis, or the tip of the penis. Pain in the area in front of the rectum and below the scrotum (perineum). Pain when ejaculating. How is this diagnosed? This condition may be diagnosed based on: A physical and medical exam. A digital rectal exam. For this, the health care provider may use a finger to feel the prostate. A urine test to check for bacteria. A semen sample or blood tests. Ultrasound. Urodynamic tests to check how your body handles urine. Cystoscopy to look inside your bladder or inside the part of your body that drains urine from the bladder (urethra). How is this treated? Treatment for this condition depends on the type of prostatitis. Treatment may involve: Medicines to relieve pain or inflammation, or to help relax your muscles.  Physical therapy. Heat therapy. Biofeedback. These techniques help you control certain body functions. Relaxation exercises. Antibiotic medicine, if your condition is caused by bacteria. Sitz baths. These warm water baths help to relax  your pelvic floor muscles, which helps to relieve pressure on the prostate. Follow these instructions at home: Medicines Take over-the-counter and prescription medicines only as told by your health care provider. If you were prescribed an antibiotic medicine, take it as told by your health care provider. Do not stop using the antibiotic even if you start to feel better. Managing pain and swelling  Take sitz baths as directed by your health care provider. For a sitz bath, sit in warm water that is deep enough to cover your hips and buttocks. If directed, apply heat to the affected area as often as told by your health care provider. Use the heat source that your health care provider recommends, such as a moist heat pack or a heating pad. Place a towel between your skin and the heat source. Leave the heat on for 20-30 minutes. Remove the heat if your skin turns bright red. This is especially important if you are unable to feel pain, heat, or cold. You may have a greater risk of getting burned. General instructions Do exercises as told by your health care provider, if you were prescribed physical therapy, biofeedback, or relaxation exercises. Keep all follow-up visits as told by your health care provider. This is important. Where to find more information [employer] of Diabetes and Digestive and Kidney Diseases: http://www.bass.com/ Contact a health care provider if: Your symptoms get worse. You have a fever. Get help right away if: You have chills. You feel light-headed or feel like you may faint. You cannot urinate. You have blood or blood clots in your urine. Summary Prostatitis is swelling or inflammation of the prostate gland. Treatment for this condition depends on the type of prostatitis. Take over-the-counter and prescription medicines only as told by your health care provider. Get help right away of you have chills, feel light-headed, feel like you may faint, cannot  urinate, or have blood or blood clots in your urine. This information is not intended to replace advice given to you by your health care provider. Make sure you discuss any questions you have with your health care provider. Document Revised: 03/18/2022 Document Reviewed: 03/18/2022 Elsevier Patient Education  [place].  Ciprofloxacin Tablets What is this medication? CIPROFLOXACIN (sip roe FLOX a sin) treats infections caused by bacteria. It belongs to a group of medications called quinolone antibiotics. It will not treat colds, the flu, or infections caused by viruses. This medicine may be used for other purposes; ask your health care provider or pharmacist if you have questions. COMMON BRAND NAME(S): Cipro What should I tell my care team before I take this medication? They need to know if you have any of these conditions: Bone problems Diabetes Heart disease High blood pressure History of irregular heartbeat History of low levels of potassium in the blood Joint problems Kidney disease Liver disease Mental health conditions Myasthenia gravis Seizures Tendon problems Tingling of the fingers or toes or other nerve disorder An unusual or allergic reaction to ciprofloxacin, other medications, foods, dyes, or preservatives Pregnant or trying to get pregnant Breastfeeding How should I use this medication? Take this medication by mouth with a full glass of water. Take it as directed on the prescription label at the same time every day. Do not crush or chew this medication. You may cut  the tablet in half if it is scored (has a line in the middle of it). This may help you swallow the tablet if the whole tablet is too big. Be sure to take both halves. Do not take just one-half of the tablet. You can take it with or without food. If it upsets your stomach, take it with food. Take all of this medication unless your care team tells you to stop it early. Keep taking it even if you think you  are better. Take products with aluminum, calcium, iron, magnesium, or zinc in them at a different time of day than this medication. Take these products 6 hours BEFORE or 2 hours AFTER taking a dose of this medication. A special MedGuide will be given to you by the pharmacist with each prescription and refill. Be sure to read this information carefully each time. Talk to your care team about the use of this medication in children. Special care may be needed. Overdosage: If you think you have taken too much of this medicine contact a poison control center or emergency room at once. NOTE: This medicine is only for you. Do not share this medicine with others. What if I miss a dose? If you miss a dose, take it as soon as you can. If it is almost time for your next dose, take only that dose. Do not take double or extra doses. What may interact with this medication? Do not take this medication with any of the following: Cisapride Dronedarone Flibanserin Lomitapide Pimozide Thioridazine Tizanidine This medication may also interact with the following: Antacids Caffeine Certain medications for diabetes, such as glipizide, glyburide, or insulin Certain medications that treat or prevent blood clots, such as warfarin Clozapine Cyclosporine Didanosine buffered tablets or powder Dofetilide Duloxetine Estrogen or progestin hormones Lanthanum carbonate Lidocaine Methotrexate Multivitamins NSAIDS, medications for pain and inflammation, such as ibuprofen or naproxen Olanzapine Omeprazole Other medications that cause heart rhythm changes Phenytoin Probenecid Ropinirole Sevelamer Sildenafil Sucralfate Theophylline Ziprasidone Zolpidem This list may not describe all possible interactions. Give your health care provider a list of all the medicines, herbs, non-prescription drugs, or dietary supplements you use. Also tell them if you smoke, drink alcohol, or use illegal drugs. Some items may  interact with your medicine. What should I watch for while using this medication? Tell your care team if your symptoms do not start to get better or if they get worse. This medication may cause serious skin reactions. They can happen weeks to months after starting the medication. Contact your care team right away if you notice fevers or flu-like symptoms with a rash. The rash may be red or purple and then turn into blisters or peeling of the skin. You may also notice a red rash with swelling of the face, lips, or lymph nodes in your neck or under your arms. Do not treat diarrhea with over the counter products. Contact your care team if you have diarrhea that lasts more than 2 days or if it is severe and watery. Check with your care team if you have severe diarrhea, nausea and vomiting, or if you sweat a lot. The loss of too much body fluid can make it dangerous for you to take this medication. This medication may increase blood sugar. Ask your care team if changes in diet or medications are needed if you have diabetes. This medication may affect your coordination, reaction time, or judgment. Do not drive or operate machinery until you know how this medication affects you.  Sit up or stand slowly to reduce the risk of dizzy or fainting spells. Drinking alcohol with this medication can increase the risk of these side effects. This medication can make you more sensitive to the sun. Keep out of the sun. If you cannot avoid being in the sun, wear protective clothing and sunscreen. Do not use sun lamps, tanning beds, or tanning booths. What side effects may I notice from receiving this medication? Side effects that you should report to your care team as soon as possible: Allergic reactions--skin rash, itching, hives, swelling of the face, lips, tongue, or throat Heart rhythm changes--fast or irregular heartbeat, dizziness, feeling faint or lightheaded, chest pain, trouble breathing Increased pressure around  the brain--severe headache, blurry vision, change in vision, nausea, vomiting Joint, muscle, or tendon pain, swelling, or stiffness Liver injury--right upper belly pain, loss of appetite, nausea, light-colored stool, dark yellow or Mehlman urine, yellowing skin or eyes, unusual weakness or fatigue Mood and behavior changes--anxiety, nervousness, confusion, hallucinations, irritability, hostility, thoughts of suicide or self-harm, worsening mood, feelings of depression Pain, tingling, or numbness in the hands or feet Redness, blistering, peeling, or loosening of the skin, including inside the mouth Seizures Severe diarrhea, fever Sudden or severe chest, back, or stomach pain Unusual vaginal discharge, itching, or odor Side effects that usually do not require medical attention (report to your care team if they continue or are bothersome): Diarrhea Dry mouth Headache Nausea This list may not describe all possible side effects. Call your doctor for medical advice about side effects. You may report side effects to FDA at 1-800-FDA-1088. Where should I keep my medication? Keep out of the reach of children and pets. Store at room temperature below 30 degrees C (86 degrees F). Keep container tightly closed. Throw away any unused medication after the expiration date. NOTE: This sheet is a summary. It may not cover all possible information. If you have questions about this medicine, talk to your doctor, pharmacist, or health care provider.  2023 Elsevier/Gold Standard (2020-05-02 00:00:00)

## 2022-07-18 ENCOUNTER — Other Ambulatory Visit: Payer: Self-pay | Admitting: Internal Medicine

## 2022-07-18 DIAGNOSIS — I10 Essential (primary) hypertension: Secondary | ICD-10-CM

## 2022-07-18 LAB — CULTURE, URINE COMPREHENSIVE
MICRO NUMBER:: 14637935
SPECIMEN QUALITY:: ADEQUATE

## 2022-07-19 NOTE — Telephone Encounter (Signed)
Requested Prescriptions  Pending Prescriptions Disp Refills   amLODipine (NORVASC) 5 MG tablet [Pharmacy Med Name: AMLODIPINE BESYLATE '5MG'$  TABLETS] 90 tablet 3    Sig: TAKE 1 TABLET(5 MG) BY MOUTH DAILY     Cardiovascular: Calcium Channel Blockers 2 Passed - 07/18/2022 10:12 AM      Passed - Last BP in normal range    BP Readings from Last 1 Encounters:  07/16/22 138/78         Passed - Last Heart Rate in normal range    Pulse Readings from Last 1 Encounters:  07/16/22 82         Passed - Valid encounter within last 6 months    Recent Outpatient Visits           3 days ago Acute prostatitis   [facility] [name], DO   3 weeks ago Uncontrolled type 2 diabetes mellitus with hyperglycemia [facility])   [facility] [name], DO   1 month ago Urinary tract infection with hematuria, site unspecified   [place], PA-C   2 months ago Uncontrolled type 2 diabetes mellitus with hyperglycemia [facility])   [facility] [name], DO   4 months ago Uncontrolled type 2 diabetes mellitus with hyperglycemia [facility])   [facility] [name], DO       Future Appointments             In 2 weeks Fletcher Anon, Mertie Clause, MD [place] [facility] [place]   In 2 months [name], [facility], [facility]

## 2022-08-05 ENCOUNTER — Encounter: Payer: Self-pay | Admitting: Cardiovascular Disease

## 2022-08-05 ENCOUNTER — Ambulatory Visit
Payer: No Typology Code available for payment source | Attending: Cardiovascular Disease | Admitting: Cardiovascular Disease

## 2022-08-05 VITALS — BP 130/70 | HR 72 | Ht 73.5 in | Wt 264.4 lb

## 2022-08-05 DIAGNOSIS — I480 Paroxysmal atrial fibrillation: Secondary | ICD-10-CM

## 2022-08-05 DIAGNOSIS — E785 Hyperlipidemia, unspecified: Secondary | ICD-10-CM | POA: Diagnosis not present

## 2022-08-05 DIAGNOSIS — G4733 Obstructive sleep apnea (adult) (pediatric): Secondary | ICD-10-CM | POA: Diagnosis not present

## 2022-08-05 DIAGNOSIS — I10 Essential (primary) hypertension: Secondary | ICD-10-CM | POA: Diagnosis not present

## 2022-08-05 NOTE — Progress Notes (Signed)
Cardiology Office Note   Date:  08/05/2022   ID:  Marvin VENEGAS Sr., DOB 27-Nov-1955, MRN [id number]  PCP:  [name], DO  Cardiologist:   Kathlyn Sacramento, MD   Chief Complaint  Patient presents with   Other    6 month f/u no complaints today. Meds reviewed verbally with pt.      History of Present Illness: Marvin Standridge. is a 67 y.o. male who presents for a follow-up visit regarding paroxysmal atrial fibrillation. He has known history of type 2 diabetes, essential hypertension, hyperlipidemia, obesity and sleep apnea.  He had previous episodes of chest pain with normal stress testing most recently in 2017. He was diagnosed with atrial fibrillation in August 2022 when he presented for a routine colonoscopy.  He was asymptomatic.  He was noted subsequently to be in sinus rhythm.  Echocardiogram in October 2022 showed normal LV systolic function with mild LVH and grade 1 diastolic dysfunction.  Outpatient monitor showed no evidence of atrial fibrillation.  He has been doing well overall with no chest pain, shortness of breath or palpitations.  Diabetes control improved with a drop in hemoglobin A1c to 7.5.  Past Medical History:  Diagnosis Date   Adrenal tumor 07/26/2018   Aortic atherosclerosis ([place])    a. 07/2018 CT chest: Atherosclerotic calcifications of the thoracic aorta.   Chest pain    a. 05/2014 Ex MV: no evidence of significant ischemia, GI uptake was noted, EF 51%, no ECG changes concerning for ischemia, normal study; b. 05/2015 Ex MV: No ischemia/infarct. EF 38% (2/2 atten artifact-->EF 55-60% by echo 05/2015).   Decreased libido    Diastolic dysfunction    a. 05/2015 Echo: EF 55-60%, mild LVH. Mild MR. Mildly dil LA/RV; b. 02/2021 Echo: EF 55-60%, no rwma, mild-mod LVH, GrI DD, nl RV size/fxn. No signif valvular dzs.   HTN (hypertension)    Hyperlipidemia    IDDM (insulin dependent diabetes mellitus)    Obesity    PAF (paroxysmal atrial fibrillation) ([place])     a. Dx 12/2020. CHA2DS2VASc = 2-3 (HTN/DM/Ao atherosclerosis); b. 02/2021 Zio: RSR, rare PACs/PVCs. No Afib or other significant arrhythmias.   Pharyngitis    Sleep apnea    uses CPAP    Past Surgical History:  Procedure Laterality Date   AV FISTULA REPAIR     CARDIAC CATHETERIZATION     [facility]    COLONOSCOPY  2013   COLONOSCOPY WITH PROPOFOL N/A 12/19/2020   Procedure: COLONOSCOPY WITH PROPOFOL;  Surgeon: [name], MD;  Location: [facility] ENDOSCOPY;  Service: Gastroenterology;  Laterality: N/A;     Current Outpatient Medications  Medication Sig Dispense Refill   ACCU-CHEK GUIDE test strip USE UP TO FOUR TIMES DAILY AS DIRECTED 100 strip 1   Accu-Chek Softclix Lancets lancets USE TO TEST BLOOD SUGAR UP TO FOUR TIMES DAILY AS DIRECTED 100 each 1   amLODipine (NORVASC) 5 MG tablet TAKE 1 TABLET(5 MG) BY MOUTH DAILY 90 tablet 0   apixaban (ELIQUIS) 5 MG TABS tablet TAKE 1 TABLET(5 MG) BY MOUTH TWICE DAILY 180 tablet 1   aspirin EC 81 MG tablet Take 81 mg by mouth daily. Swallow whole.     blood glucose meter kit and supplies Dispense based on patient and insurance preference. Use up to four times daily as directed. (FOR ICD-10 E10.9, E11.9). 1 each 0   Cholecalciferol (VITAMIN D) 2000 UNITS tablet Take 2,000 Units by mouth daily.     ciprofloxacin (CIPRO) 500  MG tablet Take 1 tablet (500 mg total) by mouth 2 (two) times daily. 60 tablet 0   Flaxseed, Linseed, (FLAXSEED OIL) 1000 MG CAPS Take 1,000 mg by mouth daily.      glipiZIDE (GLUCOTROL) 10 MG tablet TAKE 1 TABLET BY MOUTH TWICE DAILY BEFORE A MEAL FOR HIGH SUGARS 180 tablet 1   losartan (COZAAR) 100 MG tablet TAKE 1 TABLET(100 MG) BY MOUTH DAILY 90 tablet 1   metFORMIN (GLUCOPHAGE) 1000 MG tablet Take 1 tablet (1,000 mg total) by mouth 2 (two) times daily with a meal. 180 tablet 1   metoprolol tartrate (LOPRESSOR) 25 MG tablet TAKE 1 TABLET(25 MG) BY MOUTH TWICE DAILY 180 tablet 3   Omega-3 Fatty Acids (FISH OIL) 1000 MG CAPS Take 1,000  mg by mouth daily.     phenazopyridine (PYRIDIUM) 200 MG tablet Take 1 tablet (200 mg total) by mouth 3 (three) times daily as needed for pain. 10 tablet 0   pioglitazone (ACTOS) 15 MG tablet Take 1 tablet (15 mg total) by mouth daily. 90 tablet 1   rosuvastatin (CRESTOR) 20 MG tablet TAKE 1 TABLET(20 MG) BY MOUTH AT BEDTIME 90 tablet 1   traZODone (DESYREL) 100 MG tablet TAKE 1 TABLET(100 MG) BY MOUTH AT BEDTIME 90 tablet 1   TRULICITY 4.5 0000000 SOPN INJECT 4.5 MG UNDER THE SKIN ONCE A WEEK AS DIRECTED 2 mL 2   vitamin B-12 (CYANOCOBALAMIN) 1000 MCG tablet Take 1,000 mcg by mouth daily.     No current facility-administered medications for this visit.    Allergies:   Atorvastatin    Social History:  The patient  reports that he quit smoking about 24 years ago. His smoking use included cigarettes. He has a 6.25 pack-year smoking history. He has never used smokeless tobacco. He reports that he does not drink alcohol and does not use drugs.   Family History:  The patient's family history includes Alcohol abuse in his father; Breast cancer in his mother; Cancer in his mother; Diabetes in his father, sister, and sister; Kidney failure in his father; Other in his daughter.    ROS:  Please see the history of present illness.   Otherwise, review of systems are positive for none.   All other systems are reviewed and negative.    PHYSICAL EXAM: VS:  BP 130/70 (BP Location: Left Arm, Patient Position: Sitting, Cuff Size: Large)   Pulse 72   Ht 6' 1.5" (1.867 m)   Wt 264 lb 6 oz (119.9 kg)   SpO2 98%   BMI 34.41 kg/m  , BMI Body mass index is 34.41 kg/m. GEN: Well nourished, well developed, in no acute distress  HEENT: normal  Neck: no JVD, carotid bruits, or masses Cardiac: RRR; no murmurs, rubs, or gallops,no edema  Respiratory:  clear to auscultation bilaterally, normal work of breathing GI: soft, nontender, nondistended, + BS MS: no deformity or atrophy  Skin: warm and dry, no  rash Neuro:  Strength and sensation are intact Psych: euthymic mood, full affect   EKG:  EKG is ordered today. The ekg ordered today demonstrates normal sinus rhythm with no significant ST or T wave changes.   Recent Labs: 06/28/2022: Hemoglobin 13.2; Platelets 302    Lipid Panel    Component Value Date/Time   CHOL 123 06/28/2022 0954   TRIG 85 06/28/2022 0954   HDL 46 06/28/2022 0954   CHOLHDL 2.7 06/28/2022 0954   CHOLHDL 3.4 06/26/2018 0818   LDLCALC 60 06/28/2022 0954  Marvin Duncan 83 06/26/2018 0818      Wt Readings from Last 3 Encounters:  08/05/22 264 lb 6 oz (119.9 kg)  07/16/22 255 lb (115.7 kg)  06/28/22 255 lb 1.6 oz (115.7 kg)         01/27/2021   11:10 AM  PAD Screen  Previous PAD dx? No  Previous surgical procedure? No  Pain with walking? Yes  Subsides with rest? No  Feet/toe relief with dangling? Yes  Painful, non-healing ulcers? No  Extremities discolored? No      ASSESSMENT AND PLAN:  1.  Paroxysmal atrial fibrillation: He is in sinus rhythm.  No documented episodes of atrial fibrillation since his initial diagnosis in August 2022.   Continue anticoagulation with Eliquis given CHA2DS2-VASc score of 3. Given that he is on anticoagulation, I asked him to discontinue aspirin.  2.  Essential hypertension: Blood pressure is well controlled on current medications.  3.  Hyperlipidemia: Continue rosuvastatin with a target LDL of less than 70 given that he is diabetic.  Most recent lipid profile showed an LDL of 60.  4.  Obstructive sleep apnea on CPAP.  5.  Type 2 diabetes: Hemoglobin A1c improved to 7.5 from 9.2.    Disposition:   FU with me in 12 months  Signed,  Kathlyn Sacramento, MD  08/05/2022 9:47 AM    [place]

## 2022-08-05 NOTE — Patient Instructions (Signed)
Medication Instructions:  STOP the Aspirin  *If you need a refill on your cardiac medications before your next appointment, please call your pharmacy*   Lab Work: None ordered If you have labs (blood work) drawn today and your tests are completely normal, you will receive your results only by: [place] (if you have MyChart) OR A paper copy in the mail If you have any lab test that is abnormal or we need to change your treatment, we will call you to review the results.   Testing/Procedures: None ordered   Follow-Up: At [facility], you and your health needs are our priority.  As part of our continuing mission to provide you with exceptional heart care, we have created designated Provider Care Teams.  These Care Teams include your primary Cardiologist (physician) and Advanced Practice Providers (APPs -  Physician Assistants and Nurse Practitioners) who all work together to provide you with the care you need, when you need it.  We recommend signing up for the patient portal called "MyChart".  Sign up information is provided on this After Visit Summary.  MyChart is used to connect with patients for Virtual Visits (Telemedicine).  Patients are able to view lab/test results, encounter notes, upcoming appointments, etc.  Non-urgent messages can be sent to your provider as well.   To learn more about what you can do with MyChart, go to [email or web address].    Your next appointment:   12 month(s)  Provider:   You may see Kathlyn Sacramento, MD or one of the following Advanced Practice Providers on your designated Care Team:   Murray Hodgkins, NP Christell Faith, PA-C Cadence Kathlen Mody, PA-C Gerrie Nordmann, NP

## 2022-08-13 ENCOUNTER — Other Ambulatory Visit: Payer: Self-pay | Admitting: Cardiovascular Disease

## 2022-08-13 DIAGNOSIS — I480 Paroxysmal atrial fibrillation: Secondary | ICD-10-CM

## 2022-08-13 NOTE — Telephone Encounter (Addendum)
Prescription refill request for Eliquis received. Indication: a fib Last office visit: 08/05/22 Scr: 1.27 08/10/21 (overdue) Age: 67 Weight: 119kg  Called patient and lmom. PCP ran lab work recently but no metabolic panel. Has f/u scheduled in 2 months. Order placed

## 2022-08-13 NOTE — Telephone Encounter (Signed)
Refill request

## 2022-08-27 ENCOUNTER — Ambulatory Visit: Payer: No Typology Code available for payment source | Admitting: Family Medicine

## 2022-08-27 ENCOUNTER — Encounter: Payer: Self-pay | Admitting: Family Medicine

## 2022-08-27 ENCOUNTER — Ambulatory Visit
Admission: RE | Admit: 2022-08-27 | Discharge: 2022-08-27 | Disposition: A | Discharge status: Home / Self Care | Payer: No Typology Code available for payment source | Source: Ambulatory Visit | Attending: Family Medicine | Admitting: Family Medicine

## 2022-08-27 ENCOUNTER — Ambulatory Visit
Admission: RE | Admit: 2022-08-27 | Discharge: 2022-08-27 | Disposition: A | Discharge status: Home / Self Care | Payer: No Typology Code available for payment source | Attending: Family Medicine | Admitting: Family Medicine

## 2022-08-27 VITALS — BP 136/78 | HR 80 | Temp 97.8°F | Resp 16 | Ht 73.5 in | Wt 263.7 lb

## 2022-08-27 DIAGNOSIS — M79675 Pain in left toe(s): Secondary | ICD-10-CM | POA: Diagnosis not present

## 2022-08-27 DIAGNOSIS — L03032 Cellulitis of left toe: Secondary | ICD-10-CM

## 2022-08-27 DIAGNOSIS — M7989 Other specified soft tissue disorders: Secondary | ICD-10-CM | POA: Insufficient documentation

## 2022-08-27 DIAGNOSIS — E1165 Type 2 diabetes mellitus with hyperglycemia: Secondary | ICD-10-CM

## 2022-08-27 DIAGNOSIS — G5793 Unspecified mononeuropathy of bilateral lower limbs: Secondary | ICD-10-CM | POA: Diagnosis not present

## 2022-08-27 MED ORDER — SULFAMETHOXAZOLE-TRIMETHOPRIM 800-160 MG PO TABS
1.0000 | ORAL_TABLET | Freq: Two times a day (BID) | ORAL | 0 refills | Status: DC
Start: 2022-08-27 — End: 2022-09-27

## 2022-08-27 MED ORDER — AMOXICILLIN-POT CLAVULANATE 875-125 MG PO TABS
1.0000 | ORAL_TABLET | Freq: Two times a day (BID) | ORAL | 0 refills | Status: DC
Start: 2022-08-27 — End: 2022-09-27

## 2022-08-27 NOTE — Progress Notes (Signed)
Patient ID: Marvin Lips., male    DOB: Feb 15, 1956, 67 y.o.   MRN: [id number]  PCP: Margarita Mail, DO  Chief Complaint  Patient presents with   Pain    Left index toe about a week ago started hurting, looks swollen and red and it hurts too touch    Subjective:   Marvin Duncan. is a 67 y.o. male, presents to clinic with CC of the following:  HPI  Here with left second toe pain with swelling and redness 2-3 weeks of discomfort and then color change and swelling noticed in the last week He has DM and peripheral neuropathy, no injury that he recalls, no hx of gout, never needed podiatry for DM nail/foot care before Pain is worse with bumping it or walking, when at rest he has no pain or sensation   Lab Results  Component Value Date   HGBA1C 7.5 (H) 06/28/2022   Running 120-140's in the am, no change with worsening sx No fever, sweats, chills, pallor, wounds/drainage   Patient Active Problem List   Diagnosis Date Noted   Paroxysmal atrial fibrillation 07/21/2021   Uncontrolled type 2 diabetes mellitus with hyperglycemia 04/04/2020   Thoracic degenerative disc disease 11/29/2018   Insomnia 11/29/2018   Fatty liver disease, nonalcoholic 08/01/2018   Atherosclerosis of abdominal aorta 08/01/2018   Benign prostatic hyperplasia with urinary frequency 07/21/2018   Class 1 obesity with serious comorbidity and body mass index (BMI) of 34.0 to 34.9 in adult 04/17/2018   Ventral hernia without obstruction or gangrene 05/13/2016   GERD (gastroesophageal reflux disease) 01/04/2016   DM type 2, uncontrolled, with neuropathy 03/06/2015   Erectile dysfunction due to arterial insufficiency 10/24/2014   Hyperlipidemia 05/23/2014   OSA on CPAP 05/23/2014   Benign essential HTN 02/14/2007      Current Outpatient Medications:    ACCU-CHEK GUIDE test strip, USE UP TO FOUR TIMES DAILY AS DIRECTED, Disp: 100 strip, Rfl: 1   Accu-Chek Softclix Lancets lancets, USE TO TEST BLOOD  SUGAR UP TO FOUR TIMES DAILY AS DIRECTED, Disp: 100 each, Rfl: 1   amLODipine (NORVASC) 5 MG tablet, TAKE 1 TABLET(5 MG) BY MOUTH DAILY, Disp: 90 tablet, Rfl: 0   apixaban (ELIQUIS) 5 MG TABS tablet, TAKE 1 TABLET(5 MG) BY MOUTH TWICE DAILY, Disp: 180 tablet, Rfl: 0   blood glucose meter kit and supplies, Dispense based on patient and insurance preference. Use up to four times daily as directed. (FOR ICD-10 E10.9, E11.9)., Disp: 1 each, Rfl: 0   Cholecalciferol (VITAMIN D) 2000 UNITS tablet, Take 2,000 Units by mouth daily., Disp: , Rfl:    Flaxseed, Linseed, (FLAXSEED OIL) 1000 MG CAPS, Take 1,000 mg by mouth daily. , Disp: , Rfl:    glipiZIDE (GLUCOTROL) 10 MG tablet, TAKE 1 TABLET BY MOUTH TWICE DAILY BEFORE A MEAL FOR HIGH SUGARS, Disp: 180 tablet, Rfl: 1   losartan (COZAAR) 100 MG tablet, TAKE 1 TABLET(100 MG) BY MOUTH DAILY, Disp: 90 tablet, Rfl: 1   metFORMIN (GLUCOPHAGE) 1000 MG tablet, Take 1 tablet (1,000 mg total) by mouth 2 (two) times daily with a meal., Disp: 180 tablet, Rfl: 1   metoprolol tartrate (LOPRESSOR) 25 MG tablet, TAKE 1 TABLET(25 MG) BY MOUTH TWICE DAILY, Disp: 180 tablet, Rfl: 3   Omega-3 Fatty Acids (FISH OIL) 1000 MG CAPS, Take 1,000 mg by mouth daily., Disp: , Rfl:    phenazopyridine (PYRIDIUM) 200 MG tablet, Take 1 tablet (200 mg total) by mouth 3 (  three) times daily as needed for pain., Disp: 10 tablet, Rfl: 0   pioglitazone (ACTOS) 15 MG tablet, Take 1 tablet (15 mg total) by mouth daily., Disp: 90 tablet, Rfl: 1   rosuvastatin (CRESTOR) 20 MG tablet, TAKE 1 TABLET(20 MG) BY MOUTH AT BEDTIME, Disp: 90 tablet, Rfl: 1   traZODone (DESYREL) 100 MG tablet, TAKE 1 TABLET(100 MG) BY MOUTH AT BEDTIME, Disp: 90 tablet, Rfl: 1   TRULICITY 4.5 MG/0.5ML SOPN, INJECT 4.5 MG UNDER THE SKIN ONCE A WEEK AS DIRECTED, Disp: 2 mL, Rfl: 2   vitamin B-12 (CYANOCOBALAMIN) 1000 MCG tablet, Take 1,000 mcg by mouth daily., Disp: , Rfl:    Allergies  Allergen Reactions   Atorvastatin  Other (See Comments)    Statins cause headaches and muscle aches     Social History   Tobacco Use   Smoking status: Former    Packs/day: 0.25    Years: 25.00    Additional pack years: 0.00    Total pack years: 6.25    Types: Cigarettes    Quit date: 05/17/1998    Years since quitting: 24.2   Smokeless tobacco: Never   Tobacco comments:    off and on - maybe 5 cigarettes  Vaping Use   Vaping Use: Never used  Substance Use Topics   Alcohol use: No    Alcohol/week: 0.0 standard drinks of alcohol    Comment: previously but not currently   Drug use: No      Chart Review Today: I personally reviewed active problem list, medication list, allergies, family history, social history, health maintenance, notes from last encounter, lab results, imaging with the patient/caregiver today.   Review of Systems  Constitutional: Negative.   HENT: Negative.    Eyes: Negative.   Respiratory: Negative.    Cardiovascular: Negative.   Gastrointestinal: Negative.   Endocrine: Negative.   Genitourinary: Negative.   Musculoskeletal: Negative.   Skin: Negative.   Allergic/Immunologic: Negative.   Neurological: Negative.   Hematological: Negative.   Psychiatric/Behavioral: Negative.    All other systems reviewed and are negative.      Objective:   Vitals:   08/27/22 0911  BP: (!) 144/78  Pulse: 80  Resp: 16  Temp: 97.8 F (36.6 C)  TempSrc: Oral  SpO2: 96%  Weight: 263 lb 11.2 oz (119.6 kg)  Height: 6' 1.5" (1.867 m)    Body mass index is 34.32 kg/m.  Physical Exam Vitals and nursing note reviewed.  Constitutional:      General: He is not in acute distress.    Appearance: Normal appearance. He is well-developed and well-groomed. He is obese. He is not ill-appearing, toxic-appearing or diaphoretic.  HENT:     Head: Normocephalic and atraumatic.     Nose: Nose normal.  Eyes:     General:        Right eye: No discharge.        Left eye: No discharge.      Conjunctiva/sclera: Conjunctivae normal.  Neck:     Trachea: No tracheal deviation.  Cardiovascular:     Rate and Rhythm: Normal rate and regular rhythm.     Pulses:          Dorsalis pedis pulses are 2+ on the left side.       Posterior tibial pulses are 2+ on the left side.  Pulmonary:     Effort: Pulmonary effort is normal. No respiratory distress.     Breath sounds: No stridor.  Musculoskeletal:  General: Normal range of motion.     Left foot: Normal range of motion.  Feet:     Left foot:     Skin integrity: Erythema present. No ulcer, blister, skin breakdown, warmth, callus or dry skin.     Toenail Condition: Left toenails are abnormally thick.     Comments: Left 2nd toe with swelling and erythema to distal half of toe 3-5 s cap refill Normal sensation to light touch Nail appears thick with some discoloration under nail No drainage or warmth Mild ttp with palpation and manipulation Skin:    General: Skin is warm and dry.     Findings: No rash.  Neurological:     Mental Status: He is alert.     Motor: No abnormal muscle tone.     Coordination: Coordination normal.  Psychiatric:        Behavior: Behavior normal. Behavior is cooperative.            Results for orders placed or performed in visit on 07/16/22  CULTURE, URINE COMPREHENSIVE   Specimen: Urine  Result Value Ref Range   MICRO NUMBER: [id number]    SPECIMEN QUALITY: Adequate    Source URINE    STATUS: FINAL    ISOLATE 1: Escherichia coli (A)       Susceptibility   Escherichia coli - CULT, URN, SPECIAL NEGATIVE 1    AMOX/CLAVULANIC 8 Sensitive     AMPICILLIN >=32 Resistant     AMPICILLIN/SULBACTAM 16 Intermediate     CEFAZOLIN* <=4 Not Reportable      * For infections other than uncomplicated UTI caused by E. coli, K. pneumoniae or P. mirabilis: Cefazolin is resistant if MIC > or = 8 mcg/mL. (Distinguishing susceptible versus intermediate for isolates with MIC < or = 4 mcg/mL  requires additional testing.) For uncomplicated UTI caused by E. coli, K. pneumoniae or P. mirabilis: Cefazolin is susceptible if MIC <32 mcg/mL and predicts susceptible to the oral agents cefaclor, cefdinir, cefpodoxime, cefprozil, cefuroxime, cephalexin and loracarbef.     CEFTAZIDIME <=1 Sensitive     CEFEPIME <=1 Sensitive     CEFTRIAXONE <=1 Sensitive     CIPROFLOXACIN <=0.25 Sensitive     LEVOFLOXACIN 1 Intermediate     GENTAMICIN <=1 Sensitive     IMIPENEM <=0.25 Sensitive     NITROFURANTOIN <=16 Sensitive     PIP/TAZO <=4 Sensitive     TOBRAMYCIN <=1 Sensitive     TRIMETH/SULFA* >=320 Resistant      * For infections other than uncomplicated UTI caused by E. coli, K. pneumoniae or P. mirabilis: Cefazolin is resistant if MIC > or = 8 mcg/mL. (Distinguishing susceptible versus intermediate for isolates with MIC < or = 4 mcg/mL requires additional testing.) For uncomplicated UTI caused by E. coli, K. pneumoniae or P. mirabilis: Cefazolin is susceptible if MIC <32 mcg/mL and predicts susceptible to the oral agents cefaclor, cefdinir, cefpodoxime, cefprozil, cefuroxime, cephalexin and loracarbef. Legend: S = Susceptible  I = Intermediate R = Resistant  NS = Not susceptible * = Not tested  NR = Not reported **NN = See antimicrobic comments   POCT Urinalysis Dipstick  Result Value Ref Range   Color, UA Yellow    Clarity, UA Cloudy    Glucose, UA Negative Negative   Bilirubin, UA Negative    Ketones, UA Negative    Spec Grav, UA 1.015 1.010 - 1.025   Blood, UA Negative    pH, UA 6.5 5.0 - 8.0   Protein, UA  Negative Negative   Urobilinogen, UA 0.2 0.2 or 1.0 E.U./dL   Nitrite, UA Large (A)    Leukocytes, UA Small (1+) (A) Negative   Appearance Normal    Odor Strong (A)        Assessment & Plan:     ICD-10-CM   1. Pain and swelling of toe, left  M79.675 DG Foot Complete Left   M79.89 Ambulatory referral to Podiatry    C-reactive protein    CBC with  Differential/Platelet    Sedimentation rate    Uric acid    Comprehensive Metabolic Panel (CMET)    sulfamethoxazole-trimethoprim (BACTRIM DS) 800-160 MG tablet    amoxicillin-clavulanate (AUGMENTIN) 875-125 MG tablet    CANCELED: CBC with Differential/Platelet    CANCELED: COMPLETE METABOLIC PANEL WITH GFR    CANCELED: Uric acid    CANCELED: C-reactive protein    CANCELED: Sedimentation rate   onset 3+ weeks ago, noticed some swelling/redness this past week and overall worsening, no injury    2. Neuropathy involving both lower extremities  G57.93 Ambulatory referral to Podiatry    C-reactive protein    CBC with Differential/Platelet    Sedimentation rate    Uric acid    Comprehensive Metabolic Panel (CMET)    CANCELED: CBC with Differential/Platelet    CANCELED: COMPLETE METABOLIC PANEL WITH GFR    CANCELED: Uric acid    CANCELED: C-reactive protein    CANCELED: Sedimentation rate   sensation to light touch intact, denies injury, no sign of puncture or skin trauma, nail appears abnormal - pt doesn' t know if thats new or not    3. Uncontrolled type 2 diabetes mellitus with hyperglycemia  E11.65 Ambulatory referral to Podiatry    C-reactive protein    CBC with Differential/Platelet    Sedimentation rate    Uric acid    Comprehensive Metabolic Panel (CMET)    sulfamethoxazole-trimethoprim (BACTRIM DS) 800-160 MG tablet    amoxicillin-clavulanate (AUGMENTIN) 875-125 MG tablet    CANCELED: CBC with Differential/Platelet    CANCELED: COMPLETE METABOLIC PANEL WITH GFR    CANCELED: Uric acid    CANCELED: C-reactive protein    CANCELED: Sedimentation rate   last A1C was near goal, daily fasting sugars 120-140's no hx of infections or PAD    4. Cellulitis of second toe, left  L03.032 sulfamethoxazole-trimethoprim (BACTRIM DS) 800-160 MG tablet    amoxicillin-clavulanate (AUGMENTIN) 875-125 MG tablet       Labs done today Stat Xray to eval for fx, infection or bony involvement  (ie osteomyelitis)  Will get urgent referral to podiatry Tx was pending Xray - the read did return prior to me closing note - no bony abnormality, soft tissue swelling consistent with cellulitis  Double abx coverage for gram + (staph), gram neg bacilli, anaerobic and MRSA - no puncture or water involvement so did not feel pseudomonas coverage was needed ER precautions reviewed at length with pt He will need close f/up here in office or podiatry early next week.    Danelle Berry, PA-C 08/27/22 9:33 AM

## 2022-08-28 LAB — CBC WITH DIFFERENTIAL/PLATELET
Basophils Absolute: 0.1 10*3/uL (ref 0.0–0.2)
Basos: 1 %
EOS (ABSOLUTE): 0.1 10*3/uL (ref 0.0–0.4)
Eos: 2 %
Hematocrit: 41.5 % (ref 37.5–51.0)
Hemoglobin: 13 g/dL (ref 13.0–17.7)
Immature Grans (Abs): 0 10*3/uL (ref 0.0–0.1)
Immature Granulocytes: 0 %
Lymphocytes Absolute: 2.1 10*3/uL (ref 0.7–3.1)
Lymphs: 37 %
MCH: 27.6 pg (ref 26.6–33.0)
MCHC: 31.3 g/dL — ABNORMAL LOW (ref 31.5–35.7)
MCV: 88 fL (ref 79–97)
Monocytes Absolute: 0.4 10*3/uL (ref 0.1–0.9)
Monocytes: 8 %
Neutrophils Absolute: 2.9 10*3/uL (ref 1.4–7.0)
Neutrophils: 52 %
Platelets: 214 10*3/uL (ref 150–450)
RBC: 4.71 x10E6/uL (ref 4.14–5.80)
RDW: 11.1 % — ABNORMAL LOW (ref 11.6–15.4)
WBC: 5.6 10*3/uL (ref 3.4–10.8)

## 2022-08-28 LAB — COMPREHENSIVE METABOLIC PANEL
ALT: 17 IU/L (ref 0–44)
AST: 18 IU/L (ref 0–40)
Albumin/Globulin Ratio: 1.6 (ref 1.2–2.2)
Albumin: 4.1 g/dL (ref 3.9–4.9)
Alkaline Phosphatase: 86 IU/L (ref 44–121)
BUN/Creatinine Ratio: 13 (ref 10–24)
BUN: 16 mg/dL (ref 8–27)
Bilirubin Total: 0.3 mg/dL (ref 0.0–1.2)
CO2: 22 mmol/L (ref 20–29)
Calcium: 9.4 mg/dL (ref 8.6–10.2)
Chloride: 100 mmol/L (ref 96–106)
Creatinine, Ser: 1.2 mg/dL (ref 0.76–1.27)
Globulin, Total: 2.6 g/dL (ref 1.5–4.5)
Glucose: 407 mg/dL — ABNORMAL HIGH (ref 70–99)
Potassium: 4.7 mmol/L (ref 3.5–5.2)
Sodium: 136 mmol/L (ref 134–144)
Total Protein: 6.7 g/dL (ref 6.0–8.5)
eGFR: 67 mL/min/{1.73_m2} (ref >59)

## 2022-08-28 LAB — URIC ACID: Uric Acid: 4.3 mg/dL (ref 3.8–8.4)

## 2022-08-28 LAB — C-REACTIVE PROTEIN: CRP: <1 mg/L (ref 0–10)

## 2022-08-28 LAB — SEDIMENTATION RATE: Sed Rate: 24 mm/hr (ref 0–30)

## 2022-08-31 ENCOUNTER — Ambulatory Visit: Payer: No Typology Code available for payment source | Admitting: Family Medicine

## 2022-08-31 ENCOUNTER — Encounter: Payer: Self-pay | Admitting: Family Medicine

## 2022-08-31 VITALS — BP 126/70 | HR 89 | Temp 97.3°F | Resp 16 | Ht 73.5 in | Wt 260.3 lb

## 2022-08-31 DIAGNOSIS — M79675 Pain in left toe(s): Secondary | ICD-10-CM | POA: Diagnosis not present

## 2022-08-31 DIAGNOSIS — L03032 Cellulitis of left toe: Secondary | ICD-10-CM

## 2022-08-31 DIAGNOSIS — L609 Nail disorder, unspecified: Secondary | ICD-10-CM

## 2022-08-31 DIAGNOSIS — M7989 Other specified soft tissue disorders: Secondary | ICD-10-CM

## 2022-08-31 NOTE — Progress Notes (Unsigned)
Patient ID: Marvin Lips., male    DOB: [birth year], 67 y.o.   MRN: [id number]  PCP: Margarita Mail, DO  No chief complaint on file.   Subjective:   Marvin Wigger. is a 67 y.o. male, presents to clinic with CC of the following:  HPI  Recheck toe pain, redness swelling He was put on abx He has podiatry f/up on 4/25   Patient Active Problem List   Diagnosis Date Noted   Paroxysmal atrial fibrillation 07/21/2021   Uncontrolled type 2 diabetes mellitus with hyperglycemia 04/04/2020   Thoracic degenerative disc disease 11/29/2018   Insomnia 11/29/2018   Fatty liver disease, nonalcoholic 08/01/2018   Atherosclerosis of abdominal aorta 08/01/2018   Benign prostatic hyperplasia with urinary frequency 07/21/2018   Class 1 obesity with serious comorbidity and body mass index (BMI) of 34.0 to 34.9 in adult 04/17/2018   Ventral hernia without obstruction or gangrene 05/13/2016   GERD (gastroesophageal reflux disease) 01/04/2016   DM type 2, uncontrolled, with neuropathy 03/06/2015   Erectile dysfunction due to arterial insufficiency 10/24/2014   Hyperlipidemia 05/23/2014   OSA on CPAP 05/23/2014   Benign essential HTN 02/14/2007      Current Outpatient Medications:    ACCU-CHEK GUIDE test strip, USE UP TO FOUR TIMES DAILY AS DIRECTED, Disp: 100 strip, Rfl: 1   Accu-Chek Softclix Lancets lancets, USE TO TEST BLOOD SUGAR UP TO FOUR TIMES DAILY AS DIRECTED, Disp: 100 each, Rfl: 1   amLODipine (NORVASC) 5 MG tablet, TAKE 1 TABLET(5 MG) BY MOUTH DAILY, Disp: 90 tablet, Rfl: 0   amoxicillin-clavulanate (AUGMENTIN) 875-125 MG tablet, Take 1 tablet by mouth 2 (two) times daily., Disp: 20 tablet, Rfl: 0   apixaban (ELIQUIS) 5 MG TABS tablet, TAKE 1 TABLET(5 MG) BY MOUTH TWICE DAILY, Disp: 180 tablet, Rfl: 0   blood glucose meter kit and supplies, Dispense based on patient and insurance preference. Use up to four times daily as directed. (FOR ICD-10 E10.9, E11.9)., Disp: 1 each,  Rfl: 0   Cholecalciferol (VITAMIN D) 2000 UNITS tablet, Take 2,000 Units by mouth daily., Disp: , Rfl:    Flaxseed, Linseed, (FLAXSEED OIL) 1000 MG CAPS, Take 1,000 mg by mouth daily. , Disp: , Rfl:    glipiZIDE (GLUCOTROL) 10 MG tablet, TAKE 1 TABLET BY MOUTH TWICE DAILY BEFORE A MEAL FOR HIGH SUGARS, Disp: 180 tablet, Rfl: 1   losartan (COZAAR) 100 MG tablet, TAKE 1 TABLET(100 MG) BY MOUTH DAILY, Disp: 90 tablet, Rfl: 1   metFORMIN (GLUCOPHAGE) 1000 MG tablet, Take 1 tablet (1,000 mg total) by mouth 2 (two) times daily with a meal., Disp: 180 tablet, Rfl: 1   metoprolol tartrate (LOPRESSOR) 25 MG tablet, TAKE 1 TABLET(25 MG) BY MOUTH TWICE DAILY, Disp: 180 tablet, Rfl: 3   Omega-3 Fatty Acids (FISH OIL) 1000 MG CAPS, Take 1,000 mg by mouth daily., Disp: , Rfl:    phenazopyridine (PYRIDIUM) 200 MG tablet, Take 1 tablet (200 mg total) by mouth 3 (three) times daily as needed for pain., Disp: 10 tablet, Rfl: 0   pioglitazone (ACTOS) 15 MG tablet, Take 1 tablet (15 mg total) by mouth daily., Disp: 90 tablet, Rfl: 1   rosuvastatin (CRESTOR) 20 MG tablet, TAKE 1 TABLET(20 MG) BY MOUTH AT BEDTIME, Disp: 90 tablet, Rfl: 1   sulfamethoxazole-trimethoprim (BACTRIM DS) 800-160 MG tablet, Take 1 tablet by mouth 2 (two) times daily. Take with food and can add probiotic, Disp: 14 tablet, Rfl: 0   traZODone (DESYREL)  100 MG tablet, TAKE 1 TABLET(100 MG) BY MOUTH AT BEDTIME, Disp: 90 tablet, Rfl: 1   TRULICITY 4.5 MG/0.5ML SOPN, INJECT 4.5 MG UNDER THE SKIN ONCE A WEEK AS DIRECTED, Disp: 2 mL, Rfl: 2   vitamin B-12 (CYANOCOBALAMIN) 1000 MCG tablet, Take 1,000 mcg by mouth daily., Disp: , Rfl:    Allergies  Allergen Reactions   Atorvastatin Other (See Comments)    Statins cause headaches and muscle aches     Social History   Tobacco Use   Smoking status: Former    Packs/day: 0.25    Years: 25.00    Additional pack years: 0.00    Total pack years: 6.25    Types: Cigarettes    Quit date: 05/17/1998     Years since quitting: 24.3   Smokeless tobacco: Never   Tobacco comments:    off and on - maybe 5 cigarettes  Vaping Use   Vaping Use: Never used  Substance Use Topics   Alcohol use: No    Alcohol/week: 0.0 standard drinks of alcohol    Comment: previously but not currently   Drug use: No      Chart Review Today: ***  Review of Systems     Objective:   There were no vitals filed for this visit.  There is no height or weight on file to calculate BMI.  Physical Exam   Results for orders placed or performed in visit on 08/27/22  C-reactive protein  Result Value Ref Range   CRP <1 0 - 10 mg/L  CBC with Differential/Platelet  Result Value Ref Range   WBC 5.6 3.4 - 10.8 x10E3/uL   RBC 4.71 4.14 - 5.80 x10E6/uL   Hemoglobin 13.0 13.0 - 17.7 g/dL   Hematocrit 16.1 09.6 - 51.0 %   MCV 88 79 - 97 fL   MCH 27.6 26.6 - 33.0 pg   MCHC 31.3 (L) 31.5 - 35.7 g/dL   RDW 04.5 (L) 40.9 - 81.1 %   Platelets 214 150 - 450 x10E3/uL   Neutrophils 52 Not Estab. %   Lymphs 37 Not Estab. %   Monocytes 8 Not Estab. %   Eos 2 Not Estab. %   Basos 1 Not Estab. %   Neutrophils Absolute 2.9 1.4 - 7.0 x10E3/uL   Lymphocytes Absolute 2.1 0.7 - 3.1 x10E3/uL   Monocytes Absolute 0.4 0.1 - 0.9 x10E3/uL   EOS (ABSOLUTE) 0.1 0.0 - 0.4 x10E3/uL   Basophils Absolute 0.1 0.0 - 0.2 x10E3/uL   Immature Granulocytes 0 Not Estab. %   Immature Grans (Abs) 0.0 0.0 - 0.1 x10E3/uL  Sedimentation rate  Result Value Ref Range   Sed Rate 24 0 - 30 mm/hr  Uric acid  Result Value Ref Range   Uric Acid 4.3 3.8 - 8.4 mg/dL  Comprehensive Metabolic Panel (CMET)  Result Value Ref Range   Glucose 407 (H) 70 - 99 mg/dL   BUN 16 8 - 27 mg/dL   Creatinine, Ser 9.14 0.76 - 1.27 mg/dL   eGFR 67 >78 GN/FAO/1.30   BUN/Creatinine Ratio 13 10 - 24   Sodium 136 134 - 144 mmol/L   Potassium 4.7 3.5 - 5.2 mmol/L   Chloride 100 96 - 106 mmol/L   CO2 22 20 - 29 mmol/L   Calcium 9.4 8.6 - 10.2 mg/dL   Total Protein  6.7 6.0 - 8.5 g/dL   Albumin 4.1 3.9 - 4.9 g/dL   Globulin, Total 2.6 1.5 - 4.5 g/dL   Albumin/Globulin  Ratio 1.6 1.2 - 2.2   Bilirubin Total 0.3 0.0 - 1.2 mg/dL   Alkaline Phosphatase 86 44 - 121 IU/L   AST 18 0 - 40 IU/L   ALT 17 0 - 44 IU/L       Assessment & Plan:   ***     Danelle Berry, PA-C 08/31/22 2:54 PM

## 2022-09-09 ENCOUNTER — Ambulatory Visit: Payer: No Typology Code available for payment source | Admitting: Podiatry

## 2022-09-09 DIAGNOSIS — S90222A Contusion of left lesser toe(s) with damage to nail, initial encounter: Secondary | ICD-10-CM | POA: Diagnosis not present

## 2022-09-09 NOTE — Progress Notes (Signed)
Subjective:  Patient ID: Marvin Lips., male    DOB: 1956-01-14,  MRN: [id number]  Chief Complaint  Patient presents with   Nail Problem    Left foot 2nd toe nail trauma pt stated he does not know how it happened but it has been like that for a few months he stated that he is a diabetic      67 y.o. male presents with the above complaint.  Patient presents with left second digit nail trauma/contusion.  Patient is a diabetic.  He wanted to see what should be done next.  He does not want to lose the toe.  It has been like that for few months there is blood trapped underneath it.  He denies any other acute complaints.   Review of Systems: Negative except as noted in the HPI. Denies N/V/F/Ch.  Past Medical History:  Diagnosis Date   Adrenal tumor 07/26/2018   Aortic atherosclerosis (HCC)    a. 07/2018 CT chest: Atherosclerotic calcifications of the thoracic aorta.   Chest pain    a. 05/2014 Ex MV: no evidence of significant ischemia, GI uptake was noted, EF 51%, no ECG changes concerning for ischemia, normal study; b. 05/2015 Ex MV: No ischemia/infarct. EF 38% (2/2 atten artifact-->EF 55-60% by echo 05/2015).   Decreased libido    Diastolic dysfunction    a. 05/2015 Echo: EF 55-60%, mild LVH. Mild MR. Mildly dil LA/RV; b. 02/2021 Echo: EF 55-60%, no rwma, mild-mod LVH, GrI DD, nl RV size/fxn. No signif valvular dzs.   HTN (hypertension)    Hyperlipidemia    IDDM (insulin dependent diabetes mellitus)    Obesity    PAF (paroxysmal atrial fibrillation) (HCC)    a. Dx 12/2020. CHA2DS2VASc = 2-3 (HTN/DM/Ao atherosclerosis); b. 02/2021 Zio: RSR, rare PACs/PVCs. No Afib or other significant arrhythmias.   Pharyngitis    Sleep apnea    uses CPAP    Current Outpatient Medications:    ACCU-CHEK GUIDE test strip, USE UP TO FOUR TIMES DAILY AS DIRECTED, Disp: 100 strip, Rfl: 1   Accu-Chek Softclix Lancets lancets, USE TO TEST BLOOD SUGAR UP TO FOUR TIMES DAILY AS DIRECTED, Disp: 100 each, Rfl:  1   amLODipine (NORVASC) 5 MG tablet, TAKE 1 TABLET(5 MG) BY MOUTH DAILY, Disp: 90 tablet, Rfl: 0   amoxicillin-clavulanate (AUGMENTIN) 875-125 MG tablet, Take 1 tablet by mouth 2 (two) times daily., Disp: 20 tablet, Rfl: 0   apixaban (ELIQUIS) 5 MG TABS tablet, TAKE 1 TABLET(5 MG) BY MOUTH TWICE DAILY, Disp: 180 tablet, Rfl: 0   blood glucose meter kit and supplies, Dispense based on patient and insurance preference. Use up to four times daily as directed. (FOR ICD-10 E10.9, E11.9)., Disp: 1 each, Rfl: 0   Cholecalciferol (VITAMIN D) 2000 UNITS tablet, Take 2,000 Units by mouth daily., Disp: , Rfl:    Flaxseed, Linseed, (FLAXSEED OIL) 1000 MG CAPS, Take 1,000 mg by mouth daily. , Disp: , Rfl:    glipiZIDE (GLUCOTROL) 10 MG tablet, TAKE 1 TABLET BY MOUTH TWICE DAILY BEFORE A MEAL FOR HIGH SUGARS, Disp: 180 tablet, Rfl: 1   losartan (COZAAR) 100 MG tablet, TAKE 1 TABLET(100 MG) BY MOUTH DAILY, Disp: 90 tablet, Rfl: 1   metFORMIN (GLUCOPHAGE) 1000 MG tablet, Take 1 tablet (1,000 mg total) by mouth 2 (two) times daily with a meal., Disp: 180 tablet, Rfl: 1   metoprolol tartrate (LOPRESSOR) 25 MG tablet, TAKE 1 TABLET(25 MG) BY MOUTH TWICE DAILY, Disp: 180 tablet, Rfl: 3  Omega-3 Fatty Acids (FISH OIL) 1000 MG CAPS, Take 1,000 mg by mouth daily., Disp: , Rfl:    phenazopyridine (PYRIDIUM) 200 MG tablet, Take 1 tablet (200 mg total) by mouth 3 (three) times daily as needed for pain., Disp: 10 tablet, Rfl: 0   pioglitazone (ACTOS) 15 MG tablet, Take 1 tablet (15 mg total) by mouth daily., Disp: 90 tablet, Rfl: 1   rosuvastatin (CRESTOR) 20 MG tablet, TAKE 1 TABLET(20 MG) BY MOUTH AT BEDTIME, Disp: 90 tablet, Rfl: 1   sulfamethoxazole-trimethoprim (BACTRIM DS) 800-160 MG tablet, Take 1 tablet by mouth 2 (two) times daily. Take with food and can add probiotic, Disp: 14 tablet, Rfl: 0   traZODone (DESYREL) 100 MG tablet, TAKE 1 TABLET(100 MG) BY MOUTH AT BEDTIME, Disp: 90 tablet, Rfl: 1   TRULICITY 4.5  MG/0.5ML SOPN, INJECT 4.5 MG UNDER THE SKIN ONCE A WEEK AS DIRECTED (Patient not taking: Reported on 08/31/2022), Disp: 2 mL, Rfl: 2   vitamin B-12 (CYANOCOBALAMIN) 1000 MCG tablet, Take 1,000 mcg by mouth daily., Disp: , Rfl:   Social History   Tobacco Use  Smoking Status Former   Packs/day: 0.25   Years: 25.00   Additional pack years: 0.00   Total pack years: 6.25   Types: Cigarettes   Quit date: 05/17/1998   Years since quitting: 24.3  Smokeless Tobacco Never  Tobacco Comments   off and on - maybe 5 cigarettes    Allergies  Allergen Reactions   Atorvastatin Other (See Comments)    Statins cause headaches and muscle aches   Objective:  There were no vitals filed for this visit. There is no height or weight on file to calculate BMI. Constitutional Well developed. Well nourished.  Vascular Dorsalis pedis pulses palpable bilaterally. Posterior tibial pulses palpable bilaterally. Capillary refill normal to all digits.  No cyanosis or clubbing noted. Pedal hair growth normal.  Neurologic Normal speech. Oriented to person, place, and time. Epicritic sensation to light touch grossly present bilaterally.  Dermatologic Pain on palpation of the entire/total nail on 2nd digit of the left hematoma involved and 50% of the nail No other open wounds. No skin lesions.  Orthopedic: Normal joint ROM without pain or crepitus bilaterally. No visible deformities. No bony tenderness.   Radiographs: None Assessment:   1. Contusion of left lesser toe(s) with damage to nail, initial encounter    Plan:  Patient was evaluated and treated and all questions answered.  Nail contusion/dystrophy second, left -Patient elects to proceed with minor surgery to remove entire toenail today. Consent reviewed and signed by patient. -Entire/total nail excised. See procedure note. -Educated on post-procedure care including soaking. Written instructions provided and reviewed. -Patient to follow up in 2  weeks for nail check.  Procedure: Excision of entire/total nail  Location: Left 2nd toe digit Anesthesia: Lidocaine 1% plain; 1.5 mL and Marcaine 0.5% plain; 1.5 mL, digital block. Skin Prep: Betadine. Dressing: Silvadene; telfa; dry, sterile, compression dressing. Technique: Following skin prep, the toe was exsanguinated and a tourniquet was secured at the base of the toe. The affected nail border was freed and excised. The tourniquet was then removed and sterile dressing applied. Disposition: Patient tolerated procedure well. Patient to return in 2 weeks for follow-up.   No follow-ups on file.   Left second digit total nail avulsion not permanent.  Hit his nail

## 2022-09-19 ENCOUNTER — Other Ambulatory Visit: Payer: Self-pay | Admitting: Internal Medicine

## 2022-09-19 DIAGNOSIS — E1165 Type 2 diabetes mellitus with hyperglycemia: Secondary | ICD-10-CM

## 2022-09-19 DIAGNOSIS — I10 Essential (primary) hypertension: Secondary | ICD-10-CM

## 2022-09-21 NOTE — Telephone Encounter (Signed)
Unable to refill per protocol, Rx requests are too soon. Last refill for Amlodipine 07/19/22 for 90 days. Last refill for Glipizide and Metformin 04/30/22 for 90 and 1 refills.  Requested Prescriptions  Pending Prescriptions Disp Refills   amLODipine (NORVASC) 5 MG tablet [Pharmacy Med Name: AMLODIPINE BESYLATE 5MG  TABLETS] 90 tablet 0    Sig: TAKE 1 TABLET(5 MG) BY MOUTH DAILY     Cardiovascular: Calcium Channel Blockers 2 Passed - 09/19/2022  8:29 PM      Passed - Last BP in normal range    BP Readings from Last 1 Encounters:  08/31/22 126/70         Passed - Last Heart Rate in normal range    Pulse Readings from Last 1 Encounters:  08/31/22 89         Passed - Valid encounter within last 6 months    Recent Outpatient Visits           3 weeks ago Cellulitis of second toe, left   [facility] Danelle Berry, PA-C   3 weeks ago Pain and swelling of toe, left   [facility] Danelle Berry, PA-C   2 months ago Acute prostatitis   [facility] Margarita Mail, DO   2 months ago Uncontrolled type 2 diabetes mellitus with hyperglycemia [facility])   [place] [facility] Margarita Mail, DO   3 months ago Urinary tract infection with hematuria, site unspecified    [facility] Mecum, Oswaldo Conroy, PA-C       Future Appointments             In 6 days Margarita Mail, DO  [facility]             metFORMIN (GLUCOPHAGE) 1000 MG tablet [Pharmacy Med Name: METFORMIN 1000MG  TABLETS] 180 tablet 1    Sig: TAKE 1 TABLET(1000 MG) BY MOUTH TWICE DAILY WITH A MEAL     Endocrinology:  Diabetes - Biguanides Failed - 09/19/2022  8:29 PM      Failed - B12 Level in normal range and within 720 days    Vitamin B-12  Date Value Ref Range Status  12/23/2017 782 232 - 1,245 pg/mL Final         Passed - Cr in normal range and within 360 days    Creat   Date Value Ref Range Status  06/26/2018 1.15 0.70 - 1.25 mg/dL Final    Comment:    For patients >30 years of age, the reference limit for Creatinine is approximately 13% higher for people identified as African-American. .    Creatinine, Ser  Date Value Ref Range Status  08/27/2022 1.20 0.76 - 1.27 mg/dL Final   Creatinine, Urine  Date Value Ref Range Status  12/26/2015 113 20 - 370 mg/dL Final         Passed - HBA1C is between 0 and 7.9 and within 180 days    HbA1c, POC (controlled diabetic range)  Date Value Ref Range Status  03/01/2019 6.5 0.0 - 7.0 % Final   Hgb A1c MFr Bld  Date Value Ref Range Status  06/28/2022 7.5 (H) 4.8 - 5.6 % Final    Comment:             Prediabetes: 5.7 - 6.4          Diabetes: >6.4          Glycemic control for adults with  diabetes: <7.0          Passed - eGFR in normal range and within 360 days    GFR, Est African American  Date Value Ref Range Status  06/26/2018 79 > OR = 60 mL/min/1.70m2 Final   GFR calc Af Amer  Date Value Ref Range Status  04/04/2020 79 >59 mL/min/1.73 Final    Comment:    **In accordance with recommendations from the NKF-ASN Task force,**   [facility] is in the process of updating its eGFR calculation to the   2021 CKD-EPI creatinine equation that estimates kidney function   without a race variable.    GFR, Est Non African American  Date Value Ref Range Status  06/26/2018 68 > OR = 60 mL/min/1.48m2 Final   GFR calc non Af Amer  Date Value Ref Range Status  04/04/2020 68 >59 mL/min/1.73 Final   eGFR  Date Value Ref Range Status  08/27/2022 67 >59 mL/min/1.73 Final         Passed - Valid encounter within last 6 months    Recent Outpatient Visits           3 weeks ago Cellulitis of second toe, left   [place] [facility] Danelle Berry, PA-C   3 weeks ago Pain and swelling of toe, left   [facility] Danelle Berry, PA-C   2 months ago Acute prostatitis    [facility] Margarita Mail, DO   2 months ago Uncontrolled type 2 diabetes mellitus with hyperglycemia [facility])   [facility] Margarita Mail, DO   3 months ago Urinary tract infection with hematuria, site unspecified   [facility] Mecum, Oswaldo Conroy, PA-C       Future Appointments             In 6 days Margarita Mail, DO [place] [facility]            Passed - CBC within normal limits and completed in the last 12 months    WBC  Date Value Ref Range Status  08/27/2022 5.6 3.4 - 10.8 x10E3/uL Final  06/26/2018 5.7 3.8 - 10.8 Thousand/uL Final   RBC  Date Value Ref Range Status  08/27/2022 4.71 4.14 - 5.80 x10E6/uL Final  06/26/2018 4.94 4.20 - 5.80 Million/uL Final   Hemoglobin  Date Value Ref Range Status  08/27/2022 13.0 13.0 - 17.7 g/dL Final   Hematocrit  Date Value Ref Range Status  08/27/2022 41.5 37.5 - 51.0 % Final   MCHC  Date Value Ref Range Status  08/27/2022 31.3 (L) 31.5 - 35.7 g/dL Final  16/02/9603 54.0 32.0 - 36.0 g/dL Final   [facility]  Date Value Ref Range Status  08/27/2022 27.6 26.6 - 33.0 pg Final  06/26/2018 29.1 27.0 - 33.0 pg Final   MCV  Date Value Ref Range Status  08/27/2022 88 79 - 97 fL Final  04/22/2014 88 80 - 100 fL Final   No results found for: "PLTCOUNTKUC", "LABPLAT", "POCPLA" RDW  Date Value Ref Range Status  08/27/2022 11.1 (L) 11.6 - 15.4 % Final  04/22/2014 12.2 11.5 - 14.5 % Final          glipiZIDE (GLUCOTROL) 10 MG tablet [Pharmacy Med Name: GLIPIZIDE 10MG  TABLETS] 180 tablet 1    Sig: TAKE 1 TABLET BY MOUTH TWICE DAILY BEFORE A MEAL FOR HIGH SUGARS.     Endocrinology:  Diabetes - Sulfonylureas Passed -  09/19/2022  8:29 PM      Passed - HBA1C is between 0 and 7.9 and within 180 days    HbA1c, POC (controlled diabetic range)  Date Value Ref Range Status  03/01/2019 6.5 0.0 - 7.0 % Final   Hgb A1c MFr Bld   Date Value Ref Range Status  06/28/2022 7.5 (H) 4.8 - 5.6 % Final    Comment:             Prediabetes: 5.7 - 6.4          Diabetes: >6.4          Glycemic control for adults with diabetes: <7.0          Passed - Cr in normal range and within 360 days    Creat  Date Value Ref Range Status  06/26/2018 1.15 0.70 - 1.25 mg/dL Final    Comment:    For patients >67 years of age, the reference limit for Creatinine is approximately 13% higher for people identified as African-American. .    Creatinine, Ser  Date Value Ref Range Status  08/27/2022 1.20 0.76 - 1.27 mg/dL Final   Creatinine, Urine  Date Value Ref Range Status  12/26/2015 113 20 - 370 mg/dL Final         Passed - Valid encounter within last 6 months    Recent Outpatient Visits           3 weeks ago Cellulitis of second toe, left   [facility] Danelle Berry, PA-C   3 weeks ago Pain and swelling of toe, left   [facility] Danelle Berry, PA-C   2 months ago Acute prostatitis   [facility] Margarita Mail, DO   2 months ago Uncontrolled type 2 diabetes mellitus with hyperglycemia [facility])   [facility] Margarita Mail, DO   3 months ago Urinary tract infection with hematuria, site unspecified   [facility] Mecum, Oswaldo Conroy, PA-C       Future Appointments             In 6 days Margarita Mail, DO [facility] [place]

## 2022-09-26 NOTE — Progress Notes (Signed)
Established Patient Office Visit  Subjective:  Patient ID: Marvin Shippee., male    DOB: 25-Jan-1956  Age: 67 y.o. MRN: [id number]  CC:  No chief complaint on file.   HPI Marvin MORITA Sr. presents for follow up on chronic medical conditions. Patient was treated for acute prostatitis in March. Symptoms ?  Diabetes, Type 2: -Last A1c 2/24 7.5% -Medications: Glipizide 10 mg, Metformin 1000 mg twice daily, Trulicity increased to 4.5 mg weekly at LOV (takes on Saturdays), Actos 15 mg -Failed Meds: Jardiance due to UTI's  -Patient is compliant with the above medications and reports no side effects.  -Checking BG at home: 107-157 in the morning, not checking random sugars -Diet: working on just drinking water, stopped juice -Eye exam: 5/23 UTD -Microalbumin: UTD 2/24 -Foot exam: UTD 7/23 -Statin: yes -PNA vaccine: Prevnar 13 and 23 in the past, politely declines Prevnar 20 -Denies symptoms of hypoglycemia, polydipsia, numbness extremities, foot ulcers/trauma.   A.Fib: -Currently on Lopressor 25 mg, had been on Eliquis 5 mg BID -Compliant with above medications and denies adverse effects, denies abnormal bleeding.  -Following with Cardiology, note reviewed 02/04/22, has appointment in March.   Hypertension/OSA: -Medications: Amlodipine 5 mg, Losartan 100 mg, Lopressor 25 mg -Patient is compliant with above medications and reports no side effects. -Checking BP at home (average): not checking  -Denies any SOB, CP, vision changes, LE edema or symptoms of hypotension -Compliant with CPAP   HLD: -Medications: Crestor 20 mg -Patient is compliant with above medications and reports no side effects.  -Last lipid panel: 6/22: TC 121, HDL 46, Triglycerides 72, LDL 60  Lipid Panel     Component Value Date/Time   CHOL 123 06/28/2022 0954   TRIG 85 06/28/2022 0954   HDL 46 06/28/2022 0954   CHOLHDL 2.7 06/28/2022 0954   CHOLHDL 3.4 06/26/2018 0818   LDLCALC 60 06/28/2022 0954    LDLCALC 83 06/26/2018 0818   LABVLDL 17 06/28/2022 0954   GERD: -Currently on Pepcid OTC -Symptoms well controlled.    Seasonal Allergies/PND: -Using Flonase    Health Maintenance:  -Blood work will be due next month, uses [facility]  Past Medical History:  Diagnosis Date   Adrenal tumor 07/26/2018   Aortic atherosclerosis (HCC)    a. 07/2018 CT chest: Atherosclerotic calcifications of the thoracic aorta.   Chest pain    a. 05/2014 Ex MV: no evidence of significant ischemia, GI uptake was noted, EF 51%, no ECG changes concerning for ischemia, normal study; b. 05/2015 Ex MV: No ischemia/infarct. EF 38% (2/2 atten artifact-->EF 55-60% by echo 05/2015).   Decreased libido    Diastolic dysfunction    a. 05/2015 Echo: EF 55-60%, mild LVH. Mild MR. Mildly dil LA/RV; b. 02/2021 Echo: EF 55-60%, no rwma, mild-mod LVH, GrI DD, nl RV size/fxn. No signif valvular dzs.   HTN (hypertension)    Hyperlipidemia    IDDM (insulin dependent diabetes mellitus)    Obesity    PAF (paroxysmal atrial fibrillation) (HCC)    a. Dx 12/2020. CHA2DS2VASc = 2-3 (HTN/DM/Ao atherosclerosis); b. 02/2021 Zio: RSR, rare PACs/PVCs. No Afib or other significant arrhythmias.   Pharyngitis    Sleep apnea    uses CPAP    Past Surgical History:  Procedure Laterality Date   AV FISTULA REPAIR     CARDIAC CATHETERIZATION     [facility]    COLONOSCOPY  2013   COLONOSCOPY WITH PROPOFOL N/A 12/19/2020   Procedure: COLONOSCOPY WITH PROPOFOL;  Surgeon: Tobi Bastos,  Sharlet Salina, MD;  Location: [facility] ENDOSCOPY;  Service: Gastroenterology;  Laterality: N/A;    Family History  Problem Relation Age of Onset   Breast cancer Mother    Cancer Mother    Kidney failure Father    Diabetes Father    Alcohol abuse Father    Diabetes Sister    Diabetes Sister    Other Daughter     Social History   Socioeconomic History   Marital status: Married    Spouse name: Glenda   Number of children: 3   Years of education: Not on file   Highest education  level: Some college, no degree  Occupational History   Not on file  Tobacco Use   Smoking status: Former    Packs/day: 0.25    Years: 25.00    Additional pack years: 0.00    Total pack years: 6.25    Types: Cigarettes    Quit date: 05/17/1998    Years since quitting: 24.3   Smokeless tobacco: Never   Tobacco comments:    off and on - maybe 5 cigarettes  Vaping Use   Vaping Use: Never used  Substance and Sexual Activity   Alcohol use: No    Alcohol/week: 0.0 standard drinks of alcohol    Comment: previously but not currently   Drug use: No   Sexual activity: Yes    Partners: Female  Other Topics Concern   Not on file  Social History Narrative   Not on file   Social Determinants of Health   Financial Resource Strain: Low Risk  (10/16/2020)   Overall Financial Resource Strain (CARDIA)    Difficulty of Paying Living Expenses: Not hard at all  Food Insecurity: No Food Insecurity (10/16/2020)   Hunger Vital Sign    Worried About Running Out of Food in the Last Year: Never true    Ran Out of Food in the Last Year: Never true  Transportation Needs: No Transportation Needs (10/16/2020)   PRAPARE - Administrator, Civil Service (Medical): No    Lack of Transportation (Non-Medical): No  Physical Activity: Insufficiently Active (10/16/2020)   Exercise Vital Sign    Days of Exercise per Week: 2 days    Minutes of Exercise per Session: 30 min  Stress: No Stress Concern Present (10/16/2020)   [employer] of Occupational Health - Occupational Stress Questionnaire    Feeling of Stress : Only a little  Social Connections: Socially Integrated (10/16/2020)   Social Connection and Isolation Panel [NHANES]    Frequency of Communication with Friends and Family: Twice a week    Frequency of Social Gatherings with Friends and Family: Twice a week    Attends Religious Services: More than 4 times per year    Active Member of [employer] or Organizations: Yes    Attends Hospital doctor: More than 4 times per year    Marital Status: Married  Catering manager Violence: Not At Risk (10/16/2020)   Humiliation, Afraid, Rape, and Kick questionnaire    Fear of Current or Ex-Partner: No    Emotionally Abused: No    Physically Abused: No    Sexually Abused: No    Outpatient Medications Prior to Visit  Medication Sig Dispense Refill   ACCU-CHEK GUIDE test strip USE UP TO FOUR TIMES DAILY AS DIRECTED 100 strip 1   Accu-Chek Softclix Lancets lancets USE TO TEST BLOOD SUGAR UP TO FOUR TIMES DAILY AS DIRECTED 100 each 1   amLODipine (NORVASC) 5  MG tablet TAKE 1 TABLET(5 MG) BY MOUTH DAILY 90 tablet 0   amoxicillin-clavulanate (AUGMENTIN) 875-125 MG tablet Take 1 tablet by mouth 2 (two) times daily. 20 tablet 0   apixaban (ELIQUIS) 5 MG TABS tablet TAKE 1 TABLET(5 MG) BY MOUTH TWICE DAILY 180 tablet 0   blood glucose meter kit and supplies Dispense based on patient and insurance preference. Use up to four times daily as directed. (FOR ICD-10 E10.9, E11.9). 1 each 0   Cholecalciferol (VITAMIN D) 2000 UNITS tablet Take 2,000 Units by mouth daily.     Flaxseed, Linseed, (FLAXSEED OIL) 1000 MG CAPS Take 1,000 mg by mouth daily.      glipiZIDE (GLUCOTROL) 10 MG tablet TAKE 1 TABLET BY MOUTH TWICE DAILY BEFORE A MEAL FOR HIGH SUGARS 180 tablet 1   losartan (COZAAR) 100 MG tablet TAKE 1 TABLET(100 MG) BY MOUTH DAILY 90 tablet 1   metFORMIN (GLUCOPHAGE) 1000 MG tablet Take 1 tablet (1,000 mg total) by mouth 2 (two) times daily with a meal. 180 tablet 1   metoprolol tartrate (LOPRESSOR) 25 MG tablet TAKE 1 TABLET(25 MG) BY MOUTH TWICE DAILY 180 tablet 3   Omega-3 Fatty Acids (FISH OIL) 1000 MG CAPS Take 1,000 mg by mouth daily.     phenazopyridine (PYRIDIUM) 200 MG tablet Take 1 tablet (200 mg total) by mouth 3 (three) times daily as needed for pain. 10 tablet 0   pioglitazone (ACTOS) 15 MG tablet Take 1 tablet (15 mg total) by mouth daily. 90 tablet 1   rosuvastatin (CRESTOR) 20 MG  tablet TAKE 1 TABLET(20 MG) BY MOUTH AT BEDTIME 90 tablet 1   sulfamethoxazole-trimethoprim (BACTRIM DS) 800-160 MG tablet Take 1 tablet by mouth 2 (two) times daily. Take with food and can add probiotic 14 tablet 0   traZODone (DESYREL) 100 MG tablet TAKE 1 TABLET(100 MG) BY MOUTH AT BEDTIME 90 tablet 1   TRULICITY 4.5 MG/0.5ML SOPN INJECT 4.5 MG UNDER THE SKIN ONCE A WEEK AS DIRECTED (Patient not taking: Reported on 08/31/2022) 2 mL 2   vitamin B-12 (CYANOCOBALAMIN) 1000 MCG tablet Take 1,000 mcg by mouth daily.     No facility-administered medications prior to visit.    Allergies  Allergen Reactions   Atorvastatin Other (See Comments)    Statins cause headaches and muscle aches    ROS Review of Systems  Constitutional:  Negative for chills and fever.  Eyes:  Negative for visual disturbance.  Respiratory:  Negative for cough and shortness of breath.   Cardiovascular:  Negative for chest pain and palpitations.  Genitourinary:  Positive for frequency and urgency. Negative for dysuria and hematuria.  Neurological:  Negative for dizziness and headaches.      Objective:    Physical Exam Constitutional:      Appearance: Normal appearance.  HENT:     Head: Normocephalic and atraumatic.  Eyes:     Conjunctiva/sclera: Conjunctivae normal.  Cardiovascular:     Rate and Rhythm: Normal rate and regular rhythm.  Pulmonary:     Effort: Pulmonary effort is normal.     Breath sounds: Normal breath sounds.  Abdominal:     Tenderness: There is no right CVA tenderness or left CVA tenderness.  Musculoskeletal:     Right lower leg: No edema.     Left lower leg: No edema.  Skin:    General: Skin is warm and dry.  Neurological:     General: No focal deficit present.     Mental Status: He is  alert. Mental status is at baseline.  Psychiatric:        Mood and Affect: Mood normal.        Behavior: Behavior normal.     There were no vitals taken for this visit. Wt Readings from Last 3  Encounters:  08/31/22 260 lb 4.8 oz (118.1 kg)  08/27/22 263 lb 11.2 oz (119.6 kg)  08/05/22 264 lb 6 oz (119.9 kg)     Health Maintenance Due  Topic Date Due   COVID-19 Vaccine (1) Never done   OPHTHALMOLOGY EXAM  09/18/2022     There are no preventive care reminders to display for this patient.  Lab Results  Component Value Date   TSH 1.920 01/05/2021   Lab Results  Component Value Date   WBC 5.6 08/27/2022   HGB 13.0 08/27/2022   HCT 41.5 08/27/2022   MCV 88 08/27/2022   PLT 214 08/27/2022   Lab Results  Component Value Date   NA 136 08/27/2022   K 4.7 08/27/2022   CO2 22 08/27/2022   GLUCOSE 407 (H) 08/27/2022   BUN 16 08/27/2022   CREATININE 1.20 08/27/2022   BILITOT 0.3 08/27/2022   ALKPHOS 86 08/27/2022   AST 18 08/27/2022   ALT 17 08/27/2022   PROT 6.7 08/27/2022   ALBUMIN 4.1 08/27/2022   CALCIUM 9.4 08/27/2022   ANIONGAP 10 04/29/2015   EGFR 67 08/27/2022   Lab Results  Component Value Date   CHOL 123 06/28/2022   Lab Results  Component Value Date   HDL 46 06/28/2022   Lab Results  Component Value Date   LDLCALC 60 06/28/2022   Lab Results  Component Value Date   TRIG 85 06/28/2022   Lab Results  Component Value Date   CHOLHDL 2.7 06/28/2022   Lab Results  Component Value Date   HGBA1C 7.5 (H) 06/28/2022      Assessment & Plan:   1. Uncontrolled type 2 diabetes mellitus with hyperglycemia [facility]): Labs all due, ordered last time but they will be re-printed and he will go get them done today. Patient's insurance does not cover insulin, we will see what his A1c is now that he has been working on diet. Continue current regimen which includes Glipizide 10 mg, Metformin 1000 mg twice daily, Trulicity 4.5 mg weekly and Actos 15 mg. Discontinue Jardiance due to second/recurrent UTI. Follow up in 3 months.   - POCT Urinalysis Dipstick  2. Abnormal urine odor/Acute cystitis without hematuria: UA showing nitrates and leukocytes despite  finishing course of Bactrim and Macrobid. Will send for culture but treat with Augmentin x 10 days, stop Jardiance.   - POCT Urinalysis Dipstick - amoxicillin-clavulanate (AUGMENTIN) 875-125 MG tablet; Take 1 tablet by mouth 2 (two) times daily for 10 days.  Dispense: 20 tablet; Refill: 0 - Urine Culture  Follow-up: No follow-ups on file.    Margarita Mail, DO

## 2022-09-27 ENCOUNTER — Ambulatory Visit: Payer: No Typology Code available for payment source | Admitting: Internal Medicine

## 2022-09-27 ENCOUNTER — Encounter: Payer: Self-pay | Admitting: Internal Medicine

## 2022-09-27 VITALS — BP 126/72 | HR 75 | Temp 97.5°F | Resp 18 | Ht 73.5 in | Wt 261.2 lb

## 2022-09-27 DIAGNOSIS — G4700 Insomnia, unspecified: Secondary | ICD-10-CM

## 2022-09-27 DIAGNOSIS — E1165 Type 2 diabetes mellitus with hyperglycemia: Secondary | ICD-10-CM

## 2022-09-27 DIAGNOSIS — I10 Essential (primary) hypertension: Secondary | ICD-10-CM | POA: Diagnosis not present

## 2022-09-27 DIAGNOSIS — R197 Diarrhea, unspecified: Secondary | ICD-10-CM | POA: Diagnosis not present

## 2022-09-27 DIAGNOSIS — I700 Atherosclerosis of aorta: Secondary | ICD-10-CM

## 2022-09-27 DIAGNOSIS — Z7984 Long term (current) use of oral hypoglycemic drugs: Secondary | ICD-10-CM

## 2022-09-27 DIAGNOSIS — E782 Mixed hyperlipidemia: Secondary | ICD-10-CM

## 2022-09-27 LAB — POCT GLYCOSYLATED HEMOGLOBIN (HGB A1C): Hemoglobin A1C: 11 % — AB (ref 4.0–5.6)

## 2022-09-27 MED ORDER — LOSARTAN POTASSIUM 100 MG PO TABS
ORAL_TABLET | ORAL | 1 refills | Status: DC
Start: 2022-09-27 — End: 2023-04-21

## 2022-09-27 MED ORDER — METFORMIN HCL ER 500 MG PO TB24
1000.0000 mg | ORAL_TABLET | Freq: Two times a day (BID) | ORAL | 1 refills | Status: DC
Start: 2022-09-27 — End: 2023-03-22

## 2022-09-27 MED ORDER — TRAZODONE HCL 100 MG PO TABS: ORAL_TABLET | ORAL | 1 refills | Status: DC

## 2022-09-27 MED ORDER — ROSUVASTATIN CALCIUM 20 MG PO TABS: ORAL_TABLET | ORAL | 1 refills | Status: DC

## 2022-09-27 MED ORDER — TIRZEPATIDE 2.5 MG/0.5ML ~~LOC~~ SOAJ
2.5000 mg | SUBCUTANEOUS | 0 refills | Status: DC
Start: 2022-09-27 — End: 2022-10-21

## 2022-09-27 MED ORDER — PIOGLITAZONE HCL 15 MG PO TABS: 15.0000 mg | ORAL_TABLET | Freq: Every day | ORAL | 1 refills | Status: DC

## 2022-09-27 MED ORDER — GLIPIZIDE 10 MG PO TABS
ORAL_TABLET | ORAL | 1 refills | Status: DC
Start: 2022-09-27 — End: 2023-04-21

## 2022-09-28 ENCOUNTER — Telehealth: Payer: Self-pay

## 2022-09-28 NOTE — Telephone Encounter (Signed)
Prior auth started on Mounjaro through cover my meds on 09/28/22

## 2022-09-29 NOTE — Telephone Encounter (Signed)
Got prior auth approval paper on 09/29/22

## 2022-10-18 ENCOUNTER — Other Ambulatory Visit: Payer: Self-pay | Admitting: Internal Medicine

## 2022-10-18 DIAGNOSIS — I10 Essential (primary) hypertension: Secondary | ICD-10-CM

## 2022-10-19 NOTE — Telephone Encounter (Signed)
Requested Prescriptions  Pending Prescriptions Disp Refills   amLODipine (NORVASC) 5 MG tablet [Pharmacy Med Name: AMLODIPINE BESYLATE 5MG  TABLETS] 90 tablet 0    Sig: TAKE 1 TABLET(5 MG) BY MOUTH DAILY     Cardiovascular: Calcium Channel Blockers 2 Passed - 10/18/2022  9:15 PM      Passed - Last BP in normal range    BP Readings from Last 1 Encounters:  09/27/22 126/72         Passed - Last Heart Rate in normal range    Pulse Readings from Last 1 Encounters:  09/27/22 75         Passed - Valid encounter within last 6 months    Recent Outpatient Visits           3 weeks ago Uncontrolled type 2 diabetes mellitus with hyperglycemia [facility])   [place] Margarita Mail, DO   1 month ago Cellulitis of second toe, left   [facility] Danelle Berry, PA-C   1 month ago Pain and swelling of toe, left   [facility] Danelle Berry, PA-C   3 months ago Acute prostatitis   [facility] Margarita Mail, DO   3 months ago Uncontrolled type 2 diabetes mellitus with hyperglycemia [facility])   [facility] Margarita Mail, DO       Future Appointments             In 2 months Margarita Mail, DO [facility], [facility]

## 2022-10-21 ENCOUNTER — Other Ambulatory Visit: Payer: Self-pay | Admitting: Internal Medicine

## 2022-10-21 DIAGNOSIS — E1165 Type 2 diabetes mellitus with hyperglycemia: Secondary | ICD-10-CM

## 2022-10-21 NOTE — Telephone Encounter (Signed)
Medication Refill - Medication: tirzepatide Greggory Keen) 2.5 MG/0.5ML Pen   Has the patient contacted their pharmacy? Yes.    PHarmacy stated medication was not there   (Preferred Pharmacy (with phone number or street name):  [id number] [place], [place] - [address] AT [facility] OF [address] [place], [place] [address] Phone: [phone]  Fax: [phone] DEA #: [id number]  DAW Reason: --        Has the patient been seen for an appointment in the last year OR does the patient have an upcoming appointment? Yes.    Agent: Please be advised that RX refills may take up to 3 business days. We ask that you follow-up with your pharmacy.

## 2022-10-21 NOTE — Telephone Encounter (Signed)
Requested medication (s) are due for refill today - yes  Requested medication (s) are on the active medication list -yes  Future visit scheduled -yes  Last refill: 09/27/22 2ml  Notes to clinic: off protocol- provider review   Requested Prescriptions  Pending Prescriptions Disp Refills   tirzepatide (MOUNJARO) 2.5 MG/0.5ML Pen 2 mL 0    Sig: Inject 2.5 mg into the skin once a week.     Off-Protocol Failed - 10/21/2022 12:29 PM      Failed - Medication not assigned to a protocol, review manually.      Passed - Valid encounter within last 12 months    Recent Outpatient Visits           3 weeks ago Uncontrolled type 2 diabetes mellitus with hyperglycemia [facility])   [place] [facility] Margarita Mail, DO   1 month ago Cellulitis of second toe, left   [facility] Danelle Berry, PA-C   1 month ago Pain and swelling of toe, left   [facility] Danelle Berry, PA-C   3 months ago Acute prostatitis   [facility] Margarita Mail, DO   3 months ago Uncontrolled type 2 diabetes mellitus with hyperglycemia [facility])   [place] [facility] Margarita Mail, DO       Future Appointments             In 2 months Margarita Mail, DO [facility], [facility]               Requested Prescriptions  Pending Prescriptions Disp Refills   tirzepatide [facility]) 2.5 MG/0.5ML Pen 2 mL 0    Sig: Inject 2.5 mg into the skin once a week.     Off-Protocol Failed - 10/21/2022 12:29 PM      Failed - Medication not assigned to a protocol, review manually.      Passed - Valid encounter within last 12 months    Recent Outpatient Visits           3 weeks ago Uncontrolled type 2 diabetes mellitus with hyperglycemia [facility])   [place] [facility] Margarita Mail, DO   1 month ago Cellulitis of second toe, left   [facility] Danelle Berry, PA-C   1 month ago Pain and swelling of toe, left   [facility] Berry, PA-C   3 months ago Acute prostatitis   [facility] Margarita Mail, DO   3 months ago Uncontrolled type 2 diabetes mellitus with hyperglycemia [facility])   [facility] Margarita Mail, DO       Future Appointments             In 2 months Margarita Mail, DO [facility], [facility]

## 2022-10-22 MED ORDER — TIRZEPATIDE 2.5 MG/0.5ML ~~LOC~~ SOAJ: 2.5000 mg | SUBCUTANEOUS | 0 refills | Status: DC

## 2022-11-11 ENCOUNTER — Other Ambulatory Visit: Payer: Self-pay | Admitting: Cardiovascular Disease

## 2022-11-11 DIAGNOSIS — I480 Paroxysmal atrial fibrillation: Secondary | ICD-10-CM

## 2022-11-11 NOTE — Telephone Encounter (Signed)
Refill request

## 2022-11-11 NOTE — Telephone Encounter (Signed)
Prescription refill request for Eliquis received. Indication: Afib  Last office visit: 08/05/22 Kirke Corin)  Scr: 1.20 (08/27/22)  Age: 67 Weight: 118.5kg Appropriate dose. Refill sent.

## 2022-11-16 LAB — HM DIABETES EYE EXAM

## 2022-12-13 ENCOUNTER — Other Ambulatory Visit: Payer: Self-pay | Admitting: Internal Medicine

## 2022-12-13 DIAGNOSIS — E1165 Type 2 diabetes mellitus with hyperglycemia: Secondary | ICD-10-CM

## 2022-12-14 NOTE — Telephone Encounter (Signed)
Requested medication (s) are due for refill today: yes   Requested medication (s) are on the active medication list: yes  Last refill:  10/22/22 #2 ml 0 refills   Future visit scheduled: yes in 2 weeks   Notes to clinic:  medication not assigned to a protocol. Do you want to refill Rx?     Requested Prescriptions  Pending Prescriptions Disp Refills   MOUNJARO 2.5 MG/0.5ML Pen [Pharmacy Med Name: Greggory Keen 2.5MG /0.5ML INJ ( 4 PENS)] 2 mL 0    Sig: ADMINISTER 2.5 MG UNDER THE SKIN 1 TIME A WEEK     Off-Protocol Failed - 12/13/2022  8:49 PM      Failed - Medication not assigned to a protocol, review manually.      Passed - Valid encounter within last 12 months    Recent Outpatient Visits           2 months ago Uncontrolled type 2 diabetes mellitus with hyperglycemia [facility])   [place] [facility] Margarita Mail, DO   3 months ago Cellulitis of second toe, left   [facility] Danelle Berry, PA-C   3 months ago Pain and swelling of toe, left   [facility] Danelle Berry, PA-C   5 months ago Acute prostatitis   [facility] Margarita Mail, DO   5 months ago Uncontrolled type 2 diabetes mellitus with hyperglycemia [facility])   [facility] Margarita Mail, DO       Future Appointments             In 2 weeks Margarita Mail, DO [facility], [facility]

## 2022-12-23 NOTE — Progress Notes (Deleted)
Established Patient Office Visit  Subjective:  Patient ID: Marvin Bonawitz., male    DOB: 1955/11/20  Age: 67 y.o. MRN: [id number]  CC:  No chief complaint on file.   HPI Marvin PESSIN Sr. presents for follow up on chronic medical conditions.   Patient was treated for acute prostatitis in March and treated with Cipro for 4 weeks and symptoms finally resolved.  Since then he had a cellulitis on his toe, was treated with 2 more rounds of antibiotics. Saw Podiatry and toenail was removed, infection resolved. Has been having diarrhea with all the antibiotics, starting to resolve. Not on any antibiotics currently.   Diabetes, Type 2: -Last A1c 5/24 11.0% -Medications: Glipizide 10 mg, Metformin 1000 mg XR twice daily, Actos 15 mg, Mounjaro ??.  -Failed Meds: Jardiance due to UTI's. had been on Trulicity  4.5 mg weekly but not available -Patient is compliant with the above medications and reports no side effects, other than diarrhea.   -Diet: working on just drinking water, stopped juice, not changed. -Eye exam: 5/23, due. Will call to schedule  -Microalbumin: UTD 2/24 -Foot exam: UTD 7/23 -Statin: yes -PNA vaccine: Prevnar 13 and 23 in the past, politely declines Prevnar 20 -Denies symptoms of hypoglycemia, polydipsia, numbness extremities, foot ulcers/trauma.   A.Fib: -Currently on Lopressor 25 mg, had been on Eliquis 5 mg BID -Compliant with above medications and denies adverse effects, denies abnormal bleeding.  -Following with Cardiology, note reviewed 08/05/22.   Hypertension/OSA: -Medications: Amlodipine 5 mg, Losartan 100 mg, Lopressor 25 mg -Patient is compliant with above medications and reports no side effects. -Checking BP at home (average): not checking  -Denies any SOB, CP, vision changes, LE edema or symptoms of hypotension -Compliant with CPAP   HLD: -Medications: Crestor 20 mg -Patient is compliant with above medications and reports no side effects.  -Last lipid  panel:   Lipid Panel     Component Value Date/Time   CHOL 123 06/28/2022 0954   TRIG 85 06/28/2022 0954   HDL 46 06/28/2022 0954   CHOLHDL 2.7 06/28/2022 0954   CHOLHDL 3.4 06/26/2018 0818   LDLCALC 60 06/28/2022 0954   LDLCALC 83 06/26/2018 0818   LABVLDL 17 06/28/2022 0954   GERD: -Currently on Pepcid OTC -Symptoms well controlled.    Seasonal Allergies/PND: -Using Flonase    Health Maintenance:  -Blood work UTD  Past Medical History:  Diagnosis Date   Adrenal tumor 07/26/2018   Aortic atherosclerosis (HCC)    a. 07/2018 CT chest: Atherosclerotic calcifications of the thoracic aorta.   Chest pain    a. 05/2014 Ex MV: no evidence of significant ischemia, GI uptake was noted, EF 51%, no ECG changes concerning for ischemia, normal study; b. 05/2015 Ex MV: No ischemia/infarct. EF 38% (2/2 atten artifact-->EF 55-60% by echo 05/2015).   Decreased libido    Diastolic dysfunction    a. 05/2015 Echo: EF 55-60%, mild LVH. Mild MR. Mildly dil LA/RV; b. 02/2021 Echo: EF 55-60%, no rwma, mild-mod LVH, GrI DD, nl RV size/fxn. No signif valvular dzs.   HTN (hypertension)    Hyperlipidemia    IDDM (insulin dependent diabetes mellitus)    Obesity    PAF (paroxysmal atrial fibrillation) (HCC)    a. Dx 12/2020. CHA2DS2VASc = 2-3 (HTN/DM/Ao atherosclerosis); b. 02/2021 Zio: RSR, rare PACs/PVCs. No Afib or other significant arrhythmias.   Pharyngitis    Sleep apnea    uses CPAP    Past Surgical History:  Procedure Laterality Date  AV FISTULA REPAIR     CARDIAC CATHETERIZATION     [facility]    COLONOSCOPY  2013   COLONOSCOPY WITH PROPOFOL N/A 12/19/2020   Procedure: COLONOSCOPY WITH PROPOFOL;  Surgeon: Wyline Mood, MD;  Location: [facility] ENDOSCOPY;  Service: Gastroenterology;  Laterality: N/A;    Family History  Problem Relation Age of Onset   Breast cancer Mother    Cancer Mother    Kidney failure Father    Diabetes Father    Alcohol abuse Father    Diabetes Sister    Diabetes Sister     Other Daughter     Social History   Socioeconomic History   Marital status: Married    Spouse name: Glenda   Number of children: 3   Years of education: Not on file   Highest education level: Some college, no degree  Occupational History   Not on file  Tobacco Use   Smoking status: Former    Current packs/day: 0.00    Average packs/day: 0.3 packs/day for 25.0 years (6.3 ttl pk-yrs)    Types: Cigarettes    Start date: 05/17/1973    Quit date: 05/17/1998    Years since quitting: 24.6   Smokeless tobacco: Never   Tobacco comments:    off and on - maybe 5 cigarettes  Vaping Use   Vaping status: Never Used  Substance and Sexual Activity   Alcohol use: No    Alcohol/week: 0.0 standard drinks of alcohol    Comment: previously but not currently   Drug use: No   Sexual activity: Yes    Partners: Female  Other Topics Concern   Not on file  Social History Narrative   Not on file   Social Determinants of Health   Financial Resource Strain: Low Risk  (10/16/2020)   Overall Financial Resource Strain (CARDIA)    Difficulty of Paying Living Expenses: Not hard at all  Food Insecurity: No Food Insecurity (10/16/2020)   Hunger Vital Sign    Worried About Running Out of Food in the Last Year: Never true    Ran Out of Food in the Last Year: Never true  Transportation Needs: No Transportation Needs (10/16/2020)   PRAPARE - Administrator, Civil Service (Medical): No    Lack of Transportation (Non-Medical): No  Physical Activity: Insufficiently Active (10/16/2020)   Exercise Vital Sign    Days of Exercise per Week: 2 days    Minutes of Exercise per Session: 30 min  Stress: No Stress Concern Present (10/16/2020)   [employer] of Occupational Health - Occupational Stress Questionnaire    Feeling of Stress : Only a little  Social Connections: Socially Integrated (10/16/2020)   Social Connection and Isolation Panel [NHANES]    Frequency of Communication with Friends and Family:  Twice a week    Frequency of Social Gatherings with Friends and Family: Twice a week    Attends Religious Services: More than 4 times per year    Active Member of [employer] or Organizations: Yes    Attends Engineer, structural: More than 4 times per year    Marital Status: Married  Catering manager Violence: Not At Risk (10/16/2020)   Humiliation, Afraid, Rape, and Kick questionnaire    Fear of Current or Ex-Partner: No    Emotionally Abused: No    Physically Abused: No    Sexually Abused: No    Outpatient Medications Prior to Visit  Medication Sig Dispense Refill   ACCU-CHEK GUIDE test strip  USE UP TO FOUR TIMES DAILY AS DIRECTED 100 strip 1   Accu-Chek Softclix Lancets lancets USE TO TEST BLOOD SUGAR UP TO FOUR TIMES DAILY AS DIRECTED 100 each 1   amLODipine (NORVASC) 5 MG tablet TAKE 1 TABLET(5 MG) BY MOUTH DAILY 90 tablet 0   apixaban (ELIQUIS) 5 MG TABS tablet TAKE 1 TABLET(5 MG) BY MOUTH TWICE DAILY 180 tablet 1   blood glucose meter kit and supplies Dispense based on patient and insurance preference. Use up to four times daily as directed. (FOR ICD-10 E10.9, E11.9). 1 each 0   Cholecalciferol (VITAMIN D) 2000 UNITS tablet Take 2,000 Units by mouth daily.     Flaxseed, Linseed, (FLAXSEED OIL) 1000 MG CAPS Take 1,000 mg by mouth daily.      glipiZIDE (GLUCOTROL) 10 MG tablet TAKE 1 TABLET BY MOUTH TWICE DAILY BEFORE A MEAL FOR HIGH SUGARS 180 tablet 1   losartan (COZAAR) 100 MG tablet TAKE 1 TABLET(100 MG) BY MOUTH DAILY 90 tablet 1   metFORMIN (GLUCOPHAGE-XR) 500 MG 24 hr tablet Take 2 tablets (1,000 mg total) by mouth 2 (two) times daily with a meal. 360 tablet 1   metoprolol tartrate (LOPRESSOR) 25 MG tablet TAKE 1 TABLET(25 MG) BY MOUTH TWICE DAILY 180 tablet 3   MOUNJARO 2.5 MG/0.5ML Pen ADMINISTER 2.5 MG UNDER THE SKIN 1 TIME A WEEK 2 mL 0   Omega-3 Fatty Acids (FISH OIL) 1000 MG CAPS Take 1,000 mg by mouth daily.     phenazopyridine (PYRIDIUM) 200 MG tablet Take 1 tablet  (200 mg total) by mouth 3 (three) times daily as needed for pain. 10 tablet 0   pioglitazone (ACTOS) 15 MG tablet Take 1 tablet (15 mg total) by mouth daily. 90 tablet 1   rosuvastatin (CRESTOR) 20 MG tablet TAKE 1 TABLET(20 MG) BY MOUTH AT BEDTIME 90 tablet 1   traZODone (DESYREL) 100 MG tablet TAKE 1 TABLET(100 MG) BY MOUTH AT BEDTIME 90 tablet 1   vitamin B-12 (CYANOCOBALAMIN) 1000 MCG tablet Take 1,000 mcg by mouth daily.     No facility-administered medications prior to visit.    Allergies  Allergen Reactions   Atorvastatin Other (See Comments)    Statins cause headaches and muscle aches    ROS Review of Systems  Constitutional:  Negative for chills and fever.  Eyes:  Negative for visual disturbance.  Respiratory:  Negative for cough and shortness of breath.   Cardiovascular:  Negative for chest pain and palpitations.  Gastrointestinal:  Positive for diarrhea.  Genitourinary:  Negative for dysuria, frequency, hematuria and urgency.  Neurological:  Negative for dizziness and headaches.      Objective:    Physical Exam Constitutional:      Appearance: Normal appearance.  HENT:     Head: Normocephalic and atraumatic.  Eyes:     Conjunctiva/sclera: Conjunctivae normal.  Cardiovascular:     Rate and Rhythm: Normal rate and regular rhythm.  Pulmonary:     Effort: Pulmonary effort is normal.     Breath sounds: Normal breath sounds.  Abdominal:     Tenderness: There is no right CVA tenderness or left CVA tenderness.  Musculoskeletal:     Right lower leg: No edema.     Left lower leg: No edema.  Skin:    General: Skin is warm and dry.  Neurological:     General: No focal deficit present.     Mental Status: He is alert. Mental status is at baseline.  Psychiatric:  Mood and Affect: Mood normal.        Behavior: Behavior normal.     There were no vitals taken for this visit. Wt Readings from Last 3 Encounters:  09/27/22 261 lb 3.2 oz (118.5 kg)  08/31/22 260  lb 4.8 oz (118.1 kg)  08/27/22 263 lb 11.2 oz (119.6 kg)     Health Maintenance Due  Topic Date Due   COVID-19 Vaccine (1 - 2023-24 season) Never done   FOOT EXAM  11/21/2022   INFLUENZA VACCINE  12/16/2022     There are no preventive care reminders to display for this patient.  Lab Results  Component Value Date   TSH 1.920 01/05/2021   Lab Results  Component Value Date   WBC 5.6 08/27/2022   HGB 13.0 08/27/2022   HCT 41.5 08/27/2022   MCV 88 08/27/2022   PLT 214 08/27/2022   Lab Results  Component Value Date   NA 136 08/27/2022   K 4.7 08/27/2022   CO2 22 08/27/2022   GLUCOSE 407 (H) 08/27/2022   BUN 16 08/27/2022   CREATININE 1.20 08/27/2022   BILITOT 0.3 08/27/2022   ALKPHOS 86 08/27/2022   AST 18 08/27/2022   ALT 17 08/27/2022   PROT 6.7 08/27/2022   ALBUMIN 4.1 08/27/2022   CALCIUM 9.4 08/27/2022   ANIONGAP 10 04/29/2015   EGFR 67 08/27/2022   Lab Results  Component Value Date   CHOL 123 06/28/2022   Lab Results  Component Value Date   HDL 46 06/28/2022   Lab Results  Component Value Date   LDLCALC 60 06/28/2022   Lab Results  Component Value Date   TRIG 85 06/28/2022   Lab Results  Component Value Date   CHOLHDL 2.7 06/28/2022   Lab Results  Component Value Date   HGBA1C 11.0 (A) 09/27/2022      Assessment & Plan:   1. Uncontrolled type 2 diabetes mellitus with hyperglycemia (HCC): A1c increased to 11.0%, has not been on Trulicity in several weeks due to availability. Will try to get Mounjaro 2.5 mg weekly approved by insurance, will switch Metformin to extended release version, still 1000 mg BID. Continue Glipizide 10 mg BID and Actos 15 mg, refilled. Will call to schedule eye screening. Follow up in 3 months.   - POCT HgB A1C - tirzepatide (MOUNJARO) 2.5 MG/0.5ML Pen; Inject 2.5 mg into the skin once a week.  Dispense: 2 mL; Refill: 0 - metFORMIN (GLUCOPHAGE-XR) 500 MG 24 hr tablet; Take 2 tablets (1,000 mg total) by mouth 2 (two)  times daily with a meal.  Dispense: 360 tablet; Refill: 1 - glipiZIDE (GLUCOTROL) 10 MG tablet; TAKE 1 TABLET BY MOUTH TWICE DAILY BEFORE A MEAL FOR HIGH SUGARS  Dispense: 180 tablet; Refill: 1 - pioglitazone (ACTOS) 15 MG tablet; Take 1 tablet (15 mg total) by mouth daily.  Dispense: 90 tablet; Refill: 1  2. Diarrhea, unspecified type: Due to several rounds of antibiotics, recommend starting a probiotic and will switch Metformin to extended release.   3. Benign essential HTN: Stable, continue Amlodipine 5 mg, Losartan 100 mg, Lopressor 25 mg, Losartan due for refills.   - losartan (COZAAR) 100 MG tablet; TAKE 1 TABLET(100 MG) BY MOUTH DAILY  Dispense: 90 tablet; Refill: 1  4. Mixed hyperlipidemia/Atherosclerosis of abdominal aorta (HCC): Stable, refill Crestor 20 mg.  - rosuvastatin (CRESTOR) 20 MG tablet; TAKE 1 TABLET(20 MG) BY MOUTH AT BEDTIME  Dispense: 90 tablet; Refill: 1  5. Insomnia, unspecified type: Symptoms stable, continue  Trazodone 100 mg nightly, refilled.  - traZODone (DESYREL) 100 MG tablet; TAKE 1 TABLET(100 MG) BY MOUTH AT BEDTIME  Dispense: 90 tablet; Refill: 1   Follow-up: No follow-ups on file.    Margarita Mail, DO

## 2022-12-28 ENCOUNTER — Ambulatory Visit: Payer: No Typology Code available for payment source | Admitting: Internal Medicine

## 2023-01-08 ENCOUNTER — Emergency Department
Admission: EM | Admit: 2023-01-08 | Discharge: 2023-01-08 | Disposition: A | Discharge status: Home / Self Care | Payer: No Typology Code available for payment source | Attending: Emergency Medicine | Admitting: Emergency Medicine

## 2023-01-08 ENCOUNTER — Other Ambulatory Visit: Payer: Self-pay

## 2023-01-08 DIAGNOSIS — K625 Hemorrhage of anus and rectum: Secondary | ICD-10-CM | POA: Diagnosis not present

## 2023-01-08 DIAGNOSIS — Z7901 Long term (current) use of anticoagulants: Secondary | ICD-10-CM | POA: Diagnosis not present

## 2023-01-08 DIAGNOSIS — E119 Type 2 diabetes mellitus without complications: Secondary | ICD-10-CM | POA: Diagnosis not present

## 2023-01-08 DIAGNOSIS — I10 Essential (primary) hypertension: Secondary | ICD-10-CM | POA: Insufficient documentation

## 2023-01-08 LAB — BASIC METABOLIC PANEL
Anion gap: 11 (ref 5–15)
BUN: 15 mg/dL (ref 8–23)
CO2: 20 mmol/L — ABNORMAL LOW (ref 22–32)
Calcium: 9 mg/dL (ref 8.9–10.3)
Chloride: 102 mmol/L (ref 98–111)
Creatinine, Ser: 1.21 mg/dL (ref 0.61–1.24)
GFR, Estimated: >60 mL/min (ref >60)
Glucose, Bld: 379 mg/dL — ABNORMAL HIGH (ref 70–99)
Potassium: 3.9 mmol/L (ref 3.5–5.1)
Sodium: 133 mmol/L — ABNORMAL LOW (ref 135–145)

## 2023-01-08 LAB — CBC
HCT: 40.7 % (ref 39.0–52.0)
Hemoglobin: 13.3 g/dL (ref 13.0–17.0)
MCH: 28.3 pg (ref 26.0–34.0)
MCHC: 32.7 g/dL (ref 30.0–36.0)
MCV: 86.6 fL (ref 80.0–100.0)
Platelets: 217 10*3/uL (ref 150–400)
RBC: 4.7 MIL/uL (ref 4.22–5.81)
RDW: 11.1 % — ABNORMAL LOW (ref 11.5–15.5)
WBC: 6.5 10*3/uL (ref 4.0–10.5)
nRBC: 0 % (ref 0.0–0.2)

## 2023-01-08 MED ORDER — HYDROCORTISONE ACETATE 25 MG RE SUPP: 25.0000 mg | Freq: Two times a day (BID) | RECTAL | 1 refills | Status: DC

## 2023-01-08 MED ORDER — DOCUSATE SODIUM 100 MG PO CAPS
100.0000 mg | ORAL_CAPSULE | Freq: Two times a day (BID) | ORAL | 2 refills | Status: AC
Start: 2023-01-08 — End: 2024-01-08

## 2023-01-08 NOTE — ED Triage Notes (Signed)
Pt comes with c/o rectal bleeding for several days. Pt states it his getting worse. Pt is on thinner. Pt stats no pain

## 2023-01-08 NOTE — ED Provider Notes (Signed)
[facility] Provider Note    Event Date/Time   First MD Initiated Contact with Patient 01/08/23 1504     (approximate)   History   Chief Complaint Rectal Bleeding   HPI  Marvin Duncan. is a 67 y.o. male with a past medical history of hypertension, hyperlipidemia, diabetes, and atrial fibrillation on Eliquis who presents to the ED complaint of rectal bleeding.  Patient reports that over the past couple of months he has noticed intermittent episodes of bright red blood from his rectum.  He states it started out as light spotting but episodes have grown more severe since then.  He primarily notes the blood when he goes to wipe, denies any blood mixed in with his stool.  He has not had any associated rectal pain or abdominal pain, denies any nausea, vomiting, or diarrhea.  He denies any history of hemorrhoids, does report he had surgery for an anal fissure multiple years ago.     Physical Exam   Triage Vital Signs: ED Triage Vitals  Encounter Vitals Group     BP 01/08/23 1430 137/72     Systolic BP Percentile --      Diastolic BP Percentile --      Pulse Rate 01/08/23 1430 83     Resp 01/08/23 1430 18     Temp 01/08/23 1430 98 F (36.7 C)     Temp src --      SpO2 01/08/23 1430 98 %     Weight --      Height --      Head Circumference --      Peak Flow --      Pain Score 01/08/23 1429 0     Pain Loc --      Pain Education --      Exclude from Growth Chart --     Most recent vital signs: Vitals:   01/08/23 1430  BP: 137/72  Pulse: 83  Resp: 18  Temp: 98 F (36.7 C)  SpO2: 98%    Constitutional: Alert and oriented. Eyes: Conjunctivae are normal. Head: Atraumatic. Nose: No congestion/rhinnorhea. Mouth/Throat: Mucous membranes are moist.  Cardiovascular: Normal rate, regular rhythm. Grossly normal heart sounds.  2+ radial pulses bilaterally. Respiratory: Normal respiratory effort.  No retractions. Lungs CTAB. Gastrointestinal: Soft  and nontender. No distention.  Rectal exam with nonbleeding hemorrhoid, stool is guaiac negative. Musculoskeletal: No lower extremity tenderness nor edema.  Neurologic:  Normal speech and language. No gross focal neurologic deficits are appreciated.    ED Results / Procedures / Treatments   Labs (all labs ordered are listed, but only abnormal results are displayed) Labs Reviewed  CBC - Abnormal; Notable for the following components:      Result Value   RDW 11.1 (*)    All other components within normal limits  BASIC METABOLIC PANEL - Abnormal; Notable for the following components:   Sodium 133 (*)    CO2 20 (*)    Glucose, Bld 379 (*)    All other components within normal limits    PROCEDURES:  Critical Care performed: No  Procedures   MEDICATIONS ORDERED IN ED: Medications - No data to display   IMPRESSION / MDM / ASSESSMENT AND PLAN / ED COURSE  I reviewed the triage vital signs and the nursing notes.  67 y.o. male with past medical history of hypertension, hyperlipidemia, diabetes, atrial fibrillation on Eliquis who presents to the ED complaining of intermittent rectal bleeding for the past couple of months.  Patient's presentation is most consistent with acute presentation with potential threat to life or bodily function.  Differential diagnosis includes, but is not limited to, lower GI bleed, anal fissure, bleeding hernia, anemia.  Patient nontoxic-appearing and in no acute distress, vital signs are unremarkable.  He has a benign abdominal exam, rectal exam with nonbleeding hemorrhoid and stool is guaiac negative.  Labs are reassuring with no significant anemia, leukocytosis, tract abnormality, or AKI.  Patient appropriate for outpatient management and we will provide referral to general surgery for follow-up for management of apparent hemorrhoids.  He was counseled to return to the ED for new or worsening symptoms.  Patient agrees with  plan.      FINAL CLINICAL IMPRESSION(S) / ED DIAGNOSES   Final diagnoses:  Rectal bleeding     Rx / DC Orders   ED Discharge Orders          Ordered    hydrocortisone (ANUSOL-HC) 25 MG suppository  Every 12 hours        01/08/23 1631    docusate sodium (COLACE) 100 MG capsule  2 times daily        01/08/23 1631             Note:  This document was prepared using Dragon voice recognition software and may include unintentional dictation errors.   Chesley Noon, MD 01/08/23 [phone]

## 2023-01-10 NOTE — Progress Notes (Unsigned)
Established Patient Office Visit  Subjective:  Patient ID: Marvin Konow., male    DOB: [birth year]  Age: 67 y.o. MRN: [id number]  CC:  Chief Complaint  Patient presents with   Follow-up   Rectal Bleeding    No longer bleeding    HPI Marvin DUBUQUE Sr. presents for follow up on chronic medical conditions.  Patient was seen in the ER on 01/08/2023 for rectal bleeding.  Physical exam was consistent with hemorrhoids and he was prescribed hydrocortisone suppository and Colace.  Patient states he no longer having the bleeding but does point to be referred to GI for his regularly scheduled colonoscopy.  Diabetes, Type 2: -Last A1c 5/24 11.0% -Medications: Glipizide 10 mg, Metformin 1000 mg XR twice daily, Actos 15 mg, and started on Mounjaro 2.5 mg at LOV. Patient received the medication and is doing well with it so far, no side effects.  -Failed Meds: Jardiance due to UTI's. had been on Trulicity  4.5 mg weekly but not available -Patient is compliant with the above medications and reports no side effects, other than diarrhea.   -Diet: working on just drinking water, stopped juice, not changed. -Eye exam: 5/23, due. Will call to schedule  -Microalbumin: UTD 2/24 -Foot exam: Due -Statin: yes -PNA vaccine: Prevnar 13 and 23 in the past, politely declines Prevnar 20 -Denies symptoms of hypoglycemia, polydipsia, numbness extremities, foot ulcers/trauma.   A.Fib: -Currently on Lopressor 25 mg, Eliquis 5 mg BID -Compliant with above medications and denies adverse effects, denies abnormal bleeding.  -Following with Cardiology, note reviewed 08/05/22.   Hypertension/OSA: -Medications: Amlodipine 5 mg, Losartan 100 mg, Lopressor 25 mg -Patient is compliant with above medications and reports no side effects. -Checking BP at home (average): not checking  -Denies any SOB, CP, vision changes, LE edema or symptoms of hypotension -Compliant with CPAP   HLD: -Medications: Crestor 20 mg -Patient  is compliant with above medications and reports no side effects.  -Last lipid panel:   Lipid Panel     Component Value Date/Time   CHOL 123 06/28/2022 0954   TRIG 85 06/28/2022 0954   HDL 46 06/28/2022 0954   CHOLHDL 2.7 06/28/2022 0954   CHOLHDL 3.4 06/26/2018 0818   LDLCALC 60 06/28/2022 0954   LDLCALC 83 06/26/2018 0818   LABVLDL 17 06/28/2022 0954   GERD: -Currently on Pepcid OTC -Symptoms well controlled.    Seasonal Allergies/PND: -Using Flonase    Health Maintenance:  -Blood work UTD -Colon cancer screening  Past Medical History:  Diagnosis Date   Adrenal tumor 07/26/2018   Aortic atherosclerosis (HCC)    a. 07/2018 CT chest: Atherosclerotic calcifications of the thoracic aorta.   Chest pain    a. 05/2014 Ex MV: no evidence of significant ischemia, GI uptake was noted, EF 51%, no ECG changes concerning for ischemia, normal study; b. 05/2015 Ex MV: No ischemia/infarct. EF 38% (2/2 atten artifact-->EF 55-60% by echo 05/2015).   Decreased libido    Diastolic dysfunction    a. 05/2015 Echo: EF 55-60%, mild LVH. Mild MR. Mildly dil LA/RV; b. 02/2021 Echo: EF 55-60%, no rwma, mild-mod LVH, GrI DD, nl RV size/fxn. No signif valvular dzs.   HTN (hypertension)    Hyperlipidemia    IDDM (insulin dependent diabetes mellitus)    Obesity    PAF (paroxysmal atrial fibrillation) (HCC)    a. Dx 12/2020. CHA2DS2VASc = 2-3 (HTN/DM/Ao atherosclerosis); b. 02/2021 Zio: RSR, rare PACs/PVCs. No Afib or other significant arrhythmias.   Pharyngitis  Sleep apnea    uses CPAP    Past Surgical History:  Procedure Laterality Date   AV FISTULA REPAIR     CARDIAC CATHETERIZATION     [facility]    COLONOSCOPY  2013   COLONOSCOPY WITH PROPOFOL N/A 12/19/2020   Procedure: COLONOSCOPY WITH PROPOFOL;  Surgeon: Wyline Mood, MD;  Location: [facility] ENDOSCOPY;  Service: Gastroenterology;  Laterality: N/A;    Family History  Problem Relation Age of Onset   Breast cancer Mother    Cancer Mother    Kidney  failure Father    Diabetes Father    Alcohol abuse Father    Diabetes Sister    Diabetes Sister    Other Daughter     Social History   Socioeconomic History   Marital status: Married    Spouse name: Glenda   Number of children: 3   Years of education: Not on file   Highest education level: Some college, no degree  Occupational History   Not on file  Tobacco Use   Smoking status: Former    Current packs/day: 0.00    Average packs/day: 0.3 packs/day for 25.0 years (6.3 ttl pk-yrs)    Types: Cigarettes    Start date: 05/17/1973    Quit date: 05/17/1998    Years since quitting: 24.6   Smokeless tobacco: Never   Tobacco comments:    off and on - maybe 5 cigarettes  Vaping Use   Vaping status: Never Used  Substance and Sexual Activity   Alcohol use: No    Alcohol/week: 0.0 standard drinks of alcohol    Comment: previously but not currently   Drug use: No   Sexual activity: Yes    Partners: Female  Other Topics Concern   Not on file  Social History Narrative   Not on file   Social Determinants of Health   Financial Resource Strain: Low Risk  (10/16/2020)   Overall Financial Resource Strain (CARDIA)    Difficulty of Paying Living Expenses: Not hard at all  Food Insecurity: No Food Insecurity (10/16/2020)   Hunger Vital Sign    Worried About Running Out of Food in the Last Year: Never true    Ran Out of Food in the Last Year: Never true  Transportation Needs: No Transportation Needs (10/16/2020)   PRAPARE - Administrator, Civil Service (Medical): No    Lack of Transportation (Non-Medical): No  Physical Activity: Insufficiently Active (10/16/2020)   Exercise Vital Sign    Days of Exercise per Week: 2 days    Minutes of Exercise per Session: 30 min  Stress: No Stress Concern Present (10/16/2020)   [employer] of Occupational Health - Occupational Stress Questionnaire    Feeling of Stress : Only a little  Social Connections: Socially Integrated (10/16/2020)    Social Connection and Isolation Panel [NHANES]    Frequency of Communication with Friends and Family: Twice a week    Frequency of Social Gatherings with Friends and Family: Twice a week    Attends Religious Services: More than 4 times per year    Active Member of [employer] or Organizations: Yes    Attends Banker Meetings: More than 4 times per year    Marital Status: Married  Catering manager Violence: Not At Risk (10/16/2020)   Humiliation, Afraid, Rape, and Kick questionnaire    Fear of Current or Ex-Partner: No    Emotionally Abused: No    Physically Abused: No    Sexually Abused: No  Outpatient Medications Prior to Visit  Medication Sig Dispense Refill   ACCU-CHEK GUIDE test strip USE UP TO FOUR TIMES DAILY AS DIRECTED 100 strip 1   Accu-Chek Softclix Lancets lancets USE TO TEST BLOOD SUGAR UP TO FOUR TIMES DAILY AS DIRECTED 100 each 1   amLODipine (NORVASC) 5 MG tablet TAKE 1 TABLET(5 MG) BY MOUTH DAILY 90 tablet 0   apixaban (ELIQUIS) 5 MG TABS tablet TAKE 1 TABLET(5 MG) BY MOUTH TWICE DAILY 180 tablet 1   blood glucose meter kit and supplies Dispense based on patient and insurance preference. Use up to four times daily as directed. (FOR ICD-10 E10.9, E11.9). 1 each 0   Cholecalciferol (VITAMIN D) 2000 UNITS tablet Take 2,000 Units by mouth daily.     docusate sodium (COLACE) 100 MG capsule Take 1 capsule (100 mg total) by mouth 2 (two) times daily. 60 capsule 2   Flaxseed, Linseed, (FLAXSEED OIL) 1000 MG CAPS Take 1,000 mg by mouth daily.      glipiZIDE (GLUCOTROL) 10 MG tablet TAKE 1 TABLET BY MOUTH TWICE DAILY BEFORE A MEAL FOR HIGH SUGARS 180 tablet 1   hydrocortisone (ANUSOL-HC) 25 MG suppository Place 1 suppository (25 mg total) rectally every 12 (twelve) hours. 12 suppository 1   losartan (COZAAR) 100 MG tablet TAKE 1 TABLET(100 MG) BY MOUTH DAILY 90 tablet 1   metFORMIN (GLUCOPHAGE-XR) 500 MG 24 hr tablet Take 2 tablets (1,000 mg total) by mouth 2 (two) times  daily with a meal. 360 tablet 1   metoprolol tartrate (LOPRESSOR) 25 MG tablet TAKE 1 TABLET(25 MG) BY MOUTH TWICE DAILY 180 tablet 3   MOUNJARO 2.5 MG/0.5ML Pen ADMINISTER 2.5 MG UNDER THE SKIN 1 TIME A WEEK 2 mL 0   Omega-3 Fatty Acids (FISH OIL) 1000 MG CAPS Take 1,000 mg by mouth daily.     phenazopyridine (PYRIDIUM) 200 MG tablet Take 1 tablet (200 mg total) by mouth 3 (three) times daily as needed for pain. 10 tablet 0   pioglitazone (ACTOS) 15 MG tablet Take 1 tablet (15 mg total) by mouth daily. 90 tablet 1   rosuvastatin (CRESTOR) 20 MG tablet TAKE 1 TABLET(20 MG) BY MOUTH AT BEDTIME 90 tablet 1   traZODone (DESYREL) 100 MG tablet TAKE 1 TABLET(100 MG) BY MOUTH AT BEDTIME 90 tablet 1   vitamin B-12 (CYANOCOBALAMIN) 1000 MCG tablet Take 1,000 mcg by mouth daily.     No facility-administered medications prior to visit.    Allergies  Allergen Reactions   Atorvastatin Other (See Comments)    Statins cause headaches and muscle aches    ROS Review of Systems  Constitutional:  Negative for chills and fever.  Gastrointestinal:  Negative for anal bleeding and blood in stool.      Objective:    Physical Exam Constitutional:      Appearance: Normal appearance.  HENT:     Head: Normocephalic and atraumatic.  Eyes:     Conjunctiva/sclera: Conjunctivae normal.  Cardiovascular:     Rate and Rhythm: Normal rate and regular rhythm.     Pulses:          Dorsalis pedis pulses are 2+ on the right side and 2+ on the left side.  Pulmonary:     Effort: Pulmonary effort is normal.     Breath sounds: Normal breath sounds.  Abdominal:     Tenderness: There is no right CVA tenderness or left CVA tenderness.  Musculoskeletal:     Right lower leg: No edema.  Left lower leg: No edema.     Right foot: Normal range of motion. No deformity, bunion, Charcot foot, foot drop or prominent metatarsal heads.     Left foot: Normal range of motion. No deformity, bunion, Charcot foot, foot drop or  prominent metatarsal heads.  Feet:     Right foot:     Protective Sensation: 3 sites tested.  6 sites sensed.     Skin integrity: Skin integrity normal.     Toenail Condition: Right toenails are normal.     Left foot:     Protective Sensation: 3 sites tested.  6 sites sensed.     Skin integrity: Skin integrity normal.     Toenail Condition: Left toenails are normal.  Skin:    General: Skin is warm and dry.  Neurological:     General: No focal deficit present.     Mental Status: He is alert. Mental status is at baseline.  Psychiatric:        Mood and Affect: Mood normal.        Behavior: Behavior normal.     BP 128/70   Pulse 79   Temp (!) 97.5 F (36.4 C)   Ht 6' 1.5" (1.867 m)   Wt 270 lb 14.4 oz (122.9 kg)   SpO2 98%   BMI 35.26 kg/m  Wt Readings from Last 3 Encounters:  01/11/23 270 lb 14.4 oz (122.9 kg)  09/27/22 261 lb 3.2 oz (118.5 kg)  08/31/22 260 lb 4.8 oz (118.1 kg)     Health Maintenance Due  Topic Date Due   Zoster Vaccines- Shingrix (1 of 2) Never done   COVID-19 Vaccine (1 - 2023-24 season) Never done   INFLUENZA VACCINE  12/16/2022     There are no preventive care reminders to display for this patient.  Lab Results  Component Value Date   TSH 1.920 01/05/2021   Lab Results  Component Value Date   WBC 6.5 01/08/2023   HGB 13.3 01/08/2023   HCT 40.7 01/08/2023   MCV 86.6 01/08/2023   PLT 217 01/08/2023   Lab Results  Component Value Date   NA 133 (L) 01/08/2023   K 3.9 01/08/2023   CO2 20 (L) 01/08/2023   GLUCOSE 379 (H) 01/08/2023   BUN 15 01/08/2023   CREATININE 1.21 01/08/2023   BILITOT 0.3 08/27/2022   ALKPHOS 86 08/27/2022   AST 18 08/27/2022   ALT 17 08/27/2022   PROT 6.7 08/27/2022   ALBUMIN 4.1 08/27/2022   CALCIUM 9.0 01/08/2023   ANIONGAP 11 01/08/2023   EGFR 67 08/27/2022   Lab Results  Component Value Date   CHOL 123 06/28/2022   Lab Results  Component Value Date   HDL 46 06/28/2022   Lab Results  Component  Value Date   LDLCALC 60 06/28/2022   Lab Results  Component Value Date   TRIG 85 06/28/2022   Lab Results  Component Value Date   CHOLHDL 2.7 06/28/2022   Lab Results  Component Value Date   HGBA1C 11.0 (A) 09/27/2022      Assessment & Plan:   1. Uncontrolled type 2 diabetes mellitus with hyperglycemia (HCC): A1c worse today at 11.6, plan to increase Mounjaro to 5 mg weekly. Foot exam today.   - POCT HgB A1C - HM Diabetes Foot Exam - tirzepatide (MOUNJARO) 5 MG/0.5ML Pen; Inject 5 mg into the skin once a week.  Dispense: 6 mL; Refill: 0  2. Colon cancer screening: Rectal bleeding improved but referral  placed for regular screening colonoscopy.   - Ambulatory referral to Gastroenterology   Follow-up: Return in about 3 months (around 04/13/2023).    Margarita Mail, DO

## 2023-01-11 ENCOUNTER — Ambulatory Visit: Payer: No Typology Code available for payment source | Admitting: Internal Medicine

## 2023-01-11 ENCOUNTER — Encounter: Payer: Self-pay | Admitting: Internal Medicine

## 2023-01-11 VITALS — BP 128/70 | HR 79 | Temp 97.5°F | Ht 73.5 in | Wt 270.9 lb

## 2023-01-11 DIAGNOSIS — Z7984 Long term (current) use of oral hypoglycemic drugs: Secondary | ICD-10-CM

## 2023-01-11 DIAGNOSIS — E1165 Type 2 diabetes mellitus with hyperglycemia: Secondary | ICD-10-CM

## 2023-01-11 DIAGNOSIS — Z1211 Encounter for screening for malignant neoplasm of colon: Secondary | ICD-10-CM | POA: Diagnosis not present

## 2023-01-11 LAB — POCT GLYCOSYLATED HEMOGLOBIN (HGB A1C): Hemoglobin A1C: 11.6 % — AB (ref 4.0–5.6)

## 2023-01-11 MED ORDER — TIRZEPATIDE 5 MG/0.5ML ~~LOC~~ SOAJ
5.0000 mg | SUBCUTANEOUS | 0 refills | Status: DC
Start: 2023-01-11 — End: 2023-04-08

## 2023-01-21 ENCOUNTER — Other Ambulatory Visit: Payer: Self-pay | Admitting: Internal Medicine

## 2023-01-21 DIAGNOSIS — I10 Essential (primary) hypertension: Secondary | ICD-10-CM

## 2023-01-24 ENCOUNTER — Encounter: Payer: Self-pay | Admitting: *Deleted

## 2023-01-24 NOTE — Telephone Encounter (Signed)
Requested Prescriptions  Pending Prescriptions Disp Refills   amLODipine (NORVASC) 5 MG tablet [Pharmacy Med Name: AMLODIPINE BESYLATE 5MG  TABLETS] 90 tablet 0    Sig: TAKE 1 TABLET(5 MG) BY MOUTH DAILY     Cardiovascular: Calcium Channel Blockers 2 Passed - 01/21/2023  4:54 PM      Passed - Last BP in normal range    BP Readings from Last 1 Encounters:  01/11/23 128/70         Passed - Last Heart Rate in normal range    Pulse Readings from Last 1 Encounters:  01/11/23 79         Passed - Valid encounter within last 6 months    Recent Outpatient Visits           1 week ago Uncontrolled type 2 diabetes mellitus with hyperglycemia [facility])   [facility] Margarita Mail, DO   3 months ago Uncontrolled type 2 diabetes mellitus with hyperglycemia [facility])   [facility] Margarita Mail, DO   4 months ago Cellulitis of second toe, left   [facility] Danelle Berry, PA-C   5 months ago Pain and swelling of toe, left   [facility] Danelle Berry, PA-C   6 months ago Acute prostatitis   [facility] Margarita Mail, DO       Future Appointments             In 2 months Margarita Mail, DO [facility], [facility]

## 2023-02-03 ENCOUNTER — Other Ambulatory Visit: Payer: Self-pay | Admitting: Cardiovascular Disease

## 2023-02-07 ENCOUNTER — Telehealth: Payer: Self-pay | Admitting: Gastroenterology

## 2023-02-07 NOTE — Telephone Encounter (Signed)
Patient called and left a voicemail to about scheduling colonoscopy. I called the patient back to confirm that we received his voicemail and I sent it to the scheduler.

## 2023-02-08 ENCOUNTER — Telehealth: Payer: Self-pay | Admitting: Cardiovascular Disease

## 2023-02-08 ENCOUNTER — Other Ambulatory Visit: Payer: Self-pay | Admitting: *Deleted

## 2023-02-08 ENCOUNTER — Telehealth: Payer: Self-pay | Admitting: *Deleted

## 2023-02-08 DIAGNOSIS — Z1211 Encounter for screening for malignant neoplasm of colon: Secondary | ICD-10-CM

## 2023-02-08 MED ORDER — NA SULFATE-K SULFATE-MG SULF 17.5-3.13-1.6 GM/177ML PO SOLN: 1.0000 | Freq: Once | ORAL | 0 refills | Status: AC

## 2023-02-08 NOTE — Telephone Encounter (Signed)
Pre-operative Risk Assessment    Patient Name: Marvin Duncan  DOB: [birth year] MRN: [id number]      Request for Surgical Clearance    Procedure:  colonoscopy  Date of Surgery:  Clearance 03/15/2023                                   Surgeon:  not indicated Surgeon's Group or Practice Name:  [facility] Phone number:  [phone] Fax number:  [phone]   Type of Clearance Requested:   - Pharmacy:  Hold Apixaban (Eliquis) 5 mg/instructions   Type of Anesthesia:  General    Additional requests/questions:    [name]   02/08/2023, 1:53 PM

## 2023-02-08 NOTE — Telephone Encounter (Signed)
I left a message for the patient to call our office to schedule a tele visit for pre-op 

## 2023-02-08 NOTE — Telephone Encounter (Signed)
Name: Marvin Duncan  DOB: 1955/07/17  MRN: [id number]  Primary Cardiologist: Lorine Bears, MD   Preoperative team, please contact this patient and set up a phone call appointment for further preoperative risk assessment. Please obtain consent and complete medication review. Thank you for your help.  I confirm that guidance regarding antiplatelet and oral anticoagulation therapy has been completed and, if necessary, noted below.   Per office protocol, patient can hold Eliquis for 1-2 days prior to procedure.      Marcelino Duster, PA 02/08/2023, 4:16 PM [facility]

## 2023-02-08 NOTE — Telephone Encounter (Signed)
Colonoscopy schedule with Dr Tobi Bastos on 03/15/2023

## 2023-02-08 NOTE — Telephone Encounter (Signed)
Patient with diagnosis of afib on Eliquis for anticoagulation.    Procedure: colonoscopy Date of procedure: 03/15/23  CHA2DS2-VASc Score = 4  This indicates a 4.8% annual risk of stroke. The patient's score is based upon: CHF History: 0 HTN History: 1 Diabetes History: 1 Stroke History: 0 Vascular Disease History: 1 Age Score: 1 Gender Score: 0   CrCl 57mL/min using adjusted body weight Platelet count 217K  Per office protocol, patient can hold Eliquis for 1-2 days prior to procedure.    **This guidance is not considered finalized until pre-operative APP has relayed final recommendations.**

## 2023-02-08 NOTE — Telephone Encounter (Signed)
Please advise holding Eliquis prior to colonoscopy.  Thank you!  DW

## 2023-02-08 NOTE — Telephone Encounter (Signed)
Gastroenterology Pre-Procedure Review  Request Date: 03/15/2023 Requesting Physician: Dr. Tobi Bastos  PATIENT REVIEW QUESTIONS: The patient responded to the following health history questions as indicated:    1. Are you having any GI issues? no 2. Do you have a personal history of Polyps? no 3. Do you have a family history of Colon Cancer or Polyps? no 4. Diabetes Mellitus? yes (taking glipizide, metformin, Actos, Mounjaro) 5. Joint replacements in the past 12 months?no 6. Major health problems in the past 3 months?no 7. Any artificial heart valves, MVP, or defibrillator?Paroxysmal atrial fibrillation     MEDICATIONS & ALLERGIES:    Patient reports the following regarding taking any anticoagulation/antiplatelet therapy:   Plavix, Coumadin, Eliquis, Xarelto, Lovenox, Pradaxa, Brilinta, or Effient? yes (Eliquis) Aspirin? no  Patient confirms/reports the following medications:  Current Outpatient Medications  Medication Sig Dispense Refill   ACCU-CHEK GUIDE test strip USE UP TO FOUR TIMES DAILY AS DIRECTED 100 strip 1   Accu-Chek Softclix Lancets lancets USE TO TEST BLOOD SUGAR UP TO FOUR TIMES DAILY AS DIRECTED 100 each 1   amLODipine (NORVASC) 5 MG tablet TAKE 1 TABLET(5 MG) BY MOUTH DAILY 90 tablet 0   apixaban (ELIQUIS) 5 MG TABS tablet TAKE 1 TABLET(5 MG) BY MOUTH TWICE DAILY 180 tablet 1   blood glucose meter kit and supplies Dispense based on patient and insurance preference. Use up to four times daily as directed. (FOR ICD-10 E10.9, E11.9). 1 each 0   Cholecalciferol (VITAMIN D) 2000 UNITS tablet Take 2,000 Units by mouth daily.     docusate sodium (COLACE) 100 MG capsule Take 1 capsule (100 mg total) by mouth 2 (two) times daily. 60 capsule 2   Flaxseed, Linseed, (FLAXSEED OIL) 1000 MG CAPS Take 1,000 mg by mouth daily.      glipiZIDE (GLUCOTROL) 10 MG tablet TAKE 1 TABLET BY MOUTH TWICE DAILY BEFORE A MEAL FOR HIGH SUGARS 180 tablet 1   hydrocortisone (ANUSOL-HC) 25 MG suppository  Place 1 suppository (25 mg total) rectally every 12 (twelve) hours. 12 suppository 1   losartan (COZAAR) 100 MG tablet TAKE 1 TABLET(100 MG) BY MOUTH DAILY 90 tablet 1   metFORMIN (GLUCOPHAGE-XR) 500 MG 24 hr tablet Take 2 tablets (1,000 mg total) by mouth 2 (two) times daily with a meal. 360 tablet 1   metoprolol tartrate (LOPRESSOR) 25 MG tablet TAKE 1 TABLET(25 MG) BY MOUTH TWICE DAILY 180 tablet 2   Omega-3 Fatty Acids (FISH OIL) 1000 MG CAPS Take 1,000 mg by mouth daily.     phenazopyridine (PYRIDIUM) 200 MG tablet Take 1 tablet (200 mg total) by mouth 3 (three) times daily as needed for pain. (Patient not taking: Reported on 01/11/2023) 10 tablet 0   pioglitazone (ACTOS) 15 MG tablet Take 1 tablet (15 mg total) by mouth daily. 90 tablet 1   rosuvastatin (CRESTOR) 20 MG tablet TAKE 1 TABLET(20 MG) BY MOUTH AT BEDTIME 90 tablet 1   tirzepatide (MOUNJARO) 5 MG/0.5ML Pen Inject 5 mg into the skin once a week. 6 mL 0   traZODone (DESYREL) 100 MG tablet TAKE 1 TABLET(100 MG) BY MOUTH AT BEDTIME 90 tablet 1   vitamin B-12 (CYANOCOBALAMIN) 1000 MCG tablet Take 1,000 mcg by mouth daily.     No current facility-administered medications for this visit.    Patient confirms/reports the following allergies:  Allergies  Allergen Reactions   Atorvastatin Other (See Comments)    Statins cause headaches and muscle aches    No orders of the defined types  were placed in this encounter.   AUTHORIZATION INFORMATION Primary Insurance: 1D#: Group #:  Secondary Insurance: 1D#: Group #:  SCHEDULE INFORMATION: Date: 03/15/2023 Time: Location: [facility]

## 2023-02-08 NOTE — Telephone Encounter (Signed)
Pre-operative Risk Assessment    Patient Name: Marvin Duncan  DOB: March 14, 1956 MRN: [id number]     Request for Surgical Clearance    Procedure:  colonoscopy  Date of Surgery:  Clearance 03/15/23                                 Surgeon:  not indicated Surgeon's Group or Practice Name:  [facility] Phone number:  [phone] Fax number:  [phone]   Type of Clearance Requested:   Medical   Type of Anesthesia:  General    Additional requests/questions:    [name]   02/08/2023, 1:32 PM

## 2023-02-10 ENCOUNTER — Telehealth: Payer: Self-pay | Admitting: *Deleted

## 2023-02-10 NOTE — Telephone Encounter (Signed)
  Patient Consent for Virtual Visit        Marvin WIDHALM Sr. has provided verbal consent on 02/10/2023 for a virtual visit (video or telephone).   CONSENT FOR VIRTUAL VISIT FOR:  Marvin Cookey Sr.  By participating in this virtual visit I agree to the following:  I hereby voluntarily request, consent and authorize [facility] and its employed or contracted physicians, physician assistants, nurse practitioners or other licensed health care professionals (the Practitioner), to provide me with telemedicine health care services (the "Services") as deemed necessary by the treating Practitioner. I acknowledge and consent to receive the Services by the Practitioner via telemedicine. I understand that the telemedicine visit will involve communicating with the Practitioner through live audiovisual communication technology and the disclosure of certain medical information by electronic transmission. I acknowledge that I have been given the opportunity to request an in-person assessment or other available alternative prior to the telemedicine visit and am voluntarily participating in the telemedicine visit.  I understand that I have the right to withhold or withdraw my consent to the use of telemedicine in the course of my care at any time, without affecting my right to future care or treatment, and that the Practitioner or I may terminate the telemedicine visit at any time. I understand that I have the right to inspect all information obtained and/or recorded in the course of the telemedicine visit and may receive copies of available information for a reasonable fee.  I understand that some of the potential risks of receiving the Services via telemedicine include:  Delay or interruption in medical evaluation due to technological equipment failure or disruption; Information transmitted may not be sufficient (e.g. poor resolution of images) to allow for appropriate medical decision making by the  Practitioner; and/or  In rare instances, security protocols could fail, causing a breach of personal health information.  Furthermore, I acknowledge that it is my responsibility to provide information about my medical history, conditions and care that is complete and accurate to the best of my ability. I acknowledge that Practitioner's advice, recommendations, and/or decision may be based on factors not within their control, such as incomplete or inaccurate data provided by me or distortions of diagnostic images or specimens that may result from electronic transmissions. I understand that the practice of medicine is not an exact science and that Practitioner makes no warranties or guarantees regarding treatment outcomes. I acknowledge that a copy of this consent can be made available to me via my patient portal [facility] MyChart), or I can request a printed copy by calling the office of [facility].    I understand that my insurance will be billed for this visit.   I have read or had this consent read to me. I understand the contents of this consent, which adequately explains the benefits and risks of the Services being provided via telemedicine.  I have been provided ample opportunity to ask questions regarding this consent and the Services and have had my questions answered to my satisfaction. I give my informed consent for the services to be provided through the use of telemedicine in my medical care

## 2023-02-10 NOTE — Telephone Encounter (Signed)
Patient scheduled for clearance

## 2023-02-24 ENCOUNTER — Ambulatory Visit: Payer: No Typology Code available for payment source | Attending: Internal Medicine | Admitting: Nurse Practitioner

## 2023-02-24 DIAGNOSIS — Z01810 Encounter for preprocedural cardiovascular examination: Secondary | ICD-10-CM

## 2023-02-24 NOTE — Progress Notes (Signed)
Virtual Visit via Telephone Note   Because of Marvin STANDAGE Sr.'s co-morbid illnesses, he is at least at moderate risk for complications without adequate follow up.  This format is felt to be most appropriate for this patient at this time.  The patient did not have access to video technology/had technical difficulties with video requiring transitioning to audio format only (telephone).  All issues noted in this document were discussed and addressed.  No physical exam could be performed with this format.  Please refer to the patient's chart for his consent to telehealth for [facility].  Evaluation Performed:  Preoperative cardiovascular risk assessment _____________   Date:  02/24/2023   Patient ID:  Marvin Cookey Sr., DOB Oct 14, 1955, MRN [id number] Patient Location:  Home Provider location:   Office  Primary Care Provider:  Margarita Mail, DO Primary Cardiologist:  Lorine Bears, MD  Chief Complaint / Patient Profile   67 y.o. y/o male with a h/o paroxysmal atrial fibrillation, hypertension, hyperlipidemia, type 2 diabetes, obesity, and OSA who is pending colonoscopy on 03/15/2023 with [facility] and presents today for telephonic preoperative cardiovascular risk assessment.  History of Present Illness    Marvin Witty. is a 67 y.o. male who presents via audio/video conferencing for a telehealth visit today.  Pt was last seen in cardiology clinic on 08/05/2022 by Dr. Kirke Corin. At that time Marvin GROFT Sr. was doing well.  The patient is now pending procedure as outlined above. Since his last visit, he has done well from a cardiac standpoint.   He denies chest pain, palpitations, dyspnea, pnd, orthopnea, n, v, dizziness, syncope, edema, weight gain, or early satiety. All other systems reviewed and are otherwise negative except as noted above.   Past Medical History    Past Medical History:  Diagnosis Date   Adrenal tumor 07/26/2018   Aortic atherosclerosis (HCC)     a. 07/2018 CT chest: Atherosclerotic calcifications of the thoracic aorta.   Chest pain    a. 05/2014 Ex MV: no evidence of significant ischemia, GI uptake was noted, EF 51%, no ECG changes concerning for ischemia, normal study; b. 05/2015 Ex MV: No ischemia/infarct. EF 38% (2/2 atten artifact-->EF 55-60% by echo 05/2015).   Decreased libido    Diastolic dysfunction    a. 05/2015 Echo: EF 55-60%, mild LVH. Mild MR. Mildly dil LA/RV; b. 02/2021 Echo: EF 55-60%, no rwma, mild-mod LVH, GrI DD, nl RV size/fxn. No signif valvular dzs.   HTN (hypertension)    Hyperlipidemia    IDDM (insulin dependent diabetes mellitus)    Obesity    PAF (paroxysmal atrial fibrillation) (HCC)    a. Dx 12/2020. CHA2DS2VASc = 2-3 (HTN/DM/Ao atherosclerosis); b. 02/2021 Zio: RSR, rare PACs/PVCs. No Afib or other significant arrhythmias.   Pharyngitis    Sleep apnea    uses CPAP   Past Surgical History:  Procedure Laterality Date   AV FISTULA REPAIR     CARDIAC CATHETERIZATION     [facility]    COLONOSCOPY  2013   COLONOSCOPY WITH PROPOFOL N/A 12/19/2020   Procedure: COLONOSCOPY WITH PROPOFOL;  Surgeon: Wyline Mood, MD;  Location: [facility] ENDOSCOPY;  Service: Gastroenterology;  Laterality: N/A;    Allergies  Allergies  Allergen Reactions   Atorvastatin Other (See Comments)    Statins cause headaches and muscle aches    Home Medications    Prior to Admission medications   Medication Sig Start Date End Date Taking? Authorizing Provider  ACCU-CHEK GUIDE test strip  USE UP TO FOUR TIMES DAILY AS DIRECTED 12/11/21   Margarita Mail, DO  Accu-Chek Softclix Lancets lancets USE TO TEST BLOOD SUGAR UP TO FOUR TIMES DAILY AS DIRECTED 12/11/21   Margarita Mail, DO  amLODipine (NORVASC) 5 MG tablet TAKE 1 TABLET(5 MG) BY MOUTH DAILY 01/24/23   Margarita Mail, DO  apixaban (ELIQUIS) 5 MG TABS tablet TAKE 1 TABLET(5 MG) BY MOUTH TWICE DAILY 11/11/22   Iran Ouch, MD  blood glucose meter kit and supplies Dispense based  on patient and insurance preference. Use up to four times daily as directed. (FOR ICD-10 E10.9, E11.9). 11/20/21   Margarita Mail, DO  Cholecalciferol (VITAMIN D) 2000 UNITS tablet Take 2,000 Units by mouth daily.    [provider]  docusate sodium (COLACE) 100 MG capsule Take 1 capsule (100 mg total) by mouth 2 (two) times daily. 01/08/23 01/08/24  Chesley Noon, MD  Flaxseed, Linseed, (FLAXSEED OIL) 1000 MG CAPS Take 1,000 mg by mouth daily.     [provider]  glipiZIDE (GLUCOTROL) 10 MG tablet TAKE 1 TABLET BY MOUTH TWICE DAILY BEFORE A MEAL FOR HIGH SUGARS 09/27/22   Margarita Mail, DO  hydrocortisone (ANUSOL-HC) 25 MG suppository Place 1 suppository (25 mg total) rectally every 12 (twelve) hours. 01/08/23 01/08/24  Chesley Noon, MD  losartan (COZAAR) 100 MG tablet TAKE 1 TABLET(100 MG) BY MOUTH DAILY 09/27/22   Margarita Mail, DO  metFORMIN (GLUCOPHAGE-XR) 500 MG 24 hr tablet Take 2 tablets (1,000 mg total) by mouth 2 (two) times daily with a meal. 09/27/22   Margarita Mail, DO  metoprolol tartrate (LOPRESSOR) 25 MG tablet TAKE 1 TABLET(25 MG) BY MOUTH TWICE DAILY 02/03/23   Iran Ouch, MD  Omega-3 Fatty Acids (FISH OIL) 1000 MG CAPS Take 1,000 mg by mouth daily.    [provider]  phenazopyridine (PYRIDIUM) 200 MG tablet Take 1 tablet (200 mg total) by mouth 3 (three) times daily as needed for pain. Patient not taking: Reported on 01/11/2023 05/26/22   Mecum, Erin E, PA-C  rosuvastatin (CRESTOR) 20 MG tablet TAKE 1 TABLET(20 MG) BY MOUTH AT BEDTIME 09/27/22   Margarita Mail, DO  tirzepatide [facility]) 5 MG/0.5ML Pen Inject 5 mg into the skin once a week. 01/11/23   Margarita Mail, DO  traZODone (DESYREL) 100 MG tablet TAKE 1 TABLET(100 MG) BY MOUTH AT BEDTIME 09/27/22   Margarita Mail, DO  vitamin B-12 (CYANOCOBALAMIN) 1000 MCG tablet Take 1,000 mcg by mouth daily.    [provider]    Physical Exam    Vital Signs:  Marvin Cookey Sr. does not have vital signs available for review today.  Given telephonic nature of communication, physical exam is limited. AAOx3. NAD. Normal affect.  Speech and respirations are unlabored.  Accessory Clinical Findings    None  Assessment & Plan    1.  Preoperative Cardiovascular Risk Assessment:  According to the Revised Cardiac Risk Index (RCRI), his Perioperative Risk of Major Cardiac Event is (%): 0.4. His Functional Capacity in METs is: 7.99 according to the Duke Activity Status Index (DASI). Therefore, based on ACC/AHA guidelines, patient would be at acceptable risk for the planned procedure without further cardiovascular testing.   The patient was advised that if he develops new symptoms prior to surgery to contact our office to arrange for a follow-up visit, and he verbalized understanding.  Per office protocol, patient can hold Eliquis for 1-2 days prior to procedure. Please resume Eliquis as soon as possible postprocedure,  at the discretion of the surgeon.   A copy of this note will be routed to requesting surgeon.  Time:   Today, I have spent 5 minutes with the patient with telehealth technology discussing medical history, symptoms, and management plan.    Joylene Grapes, NP  02/24/2023, 2:50 PM

## 2023-03-03 ENCOUNTER — Telehealth: Payer: Self-pay | Admitting: *Deleted

## 2023-03-03 NOTE — Telephone Encounter (Signed)
Will notified patient on Friday, 03/11/2023.  Per office protocol, patient can hold Eliquis for 1-2 days prior to procedure. Please resume Eliquis as soon as possible postprocedure, at the discretion of the surgeon.

## 2023-03-03 NOTE — Telephone Encounter (Signed)
Per televisit with cardiology  Assessment & Plan    1.  Preoperative Cardiovascular Risk Assessment:   According to the Revised Cardiac Risk Index (RCRI), his Perioperative Risk of Major Cardiac Event is (%): 0.4. His Functional Capacity in METs is: 7.99 according to the Duke Activity Status Index (DASI). Therefore, based on ACC/AHA guidelines, patient would be at acceptable risk for the planned procedure without further cardiovascular testing.    The patient was advised that if he develops new symptoms prior to surgery to contact our office to arrange for a follow-up visit, and he verbalized understanding.   Per office protocol, patient can hold Eliquis for 1-2 days prior to procedure. Please resume Eliquis as soon as possible postprocedure, at the discretion of the surgeon.    A copy of this note will be routed to requesting surgeon.   Time:   Today, I have spent 5 minutes with the patient with telehealth technology discussing medical history, symptoms, and management plan.     Joylene Grapes, NP   02/24/2023, 2:50 PM

## 2023-03-08 ENCOUNTER — Encounter: Payer: Self-pay | Admitting: Gastroenterology

## 2023-03-08 ENCOUNTER — Other Ambulatory Visit: Payer: Self-pay

## 2023-03-10 ENCOUNTER — Other Ambulatory Visit: Payer: Self-pay | Admitting: Internal Medicine

## 2023-03-10 DIAGNOSIS — E1165 Type 2 diabetes mellitus with hyperglycemia: Secondary | ICD-10-CM

## 2023-03-11 NOTE — Telephone Encounter (Signed)
Requested Prescriptions  Refused Prescriptions Disp Refills   pioglitazone (ACTOS) 15 MG tablet [Pharmacy Med Name: PIOGLITAZONE 15MG  TABLETS] 90 tablet 1    Sig: TAKE 1 TABLET(15 MG) BY MOUTH DAILY     Endocrinology:  Diabetes - Glitazones - pioglitazone Failed - 03/10/2023  3:37 AM      Failed - HBA1C is between 0 and 7.9 and within 180 days    Hemoglobin A1C  Date Value Ref Range Status  01/11/2023 11.6 (A) 4.0 - 5.6 % Final   HbA1c, POC (controlled diabetic range)  Date Value Ref Range Status  03/01/2019 6.5 0.0 - 7.0 % Final   Hgb A1c MFr Bld  Date Value Ref Range Status  06/28/2022 7.5 (H) 4.8 - 5.6 % Final    Comment:             Prediabetes: 5.7 - 6.4          Diabetes: >6.4          Glycemic control for adults with diabetes: <7.0          Passed - Valid encounter within last 6 months    Recent Outpatient Visits           1 month ago Uncontrolled type 2 diabetes mellitus with hyperglycemia [facility])   [place] Margarita Mail, DO   5 months ago Uncontrolled type 2 diabetes mellitus with hyperglycemia [facility])   [facility] Margarita Mail, DO   6 months ago Cellulitis of second toe, left   [facility] Danelle Berry, PA-C   6 months ago Pain and swelling of toe, left   [facility] Danelle Berry, PA-C   7 months ago Acute prostatitis   [facility] Margarita Mail, DO       Future Appointments             In 1 month Margarita Mail, DO [facility], [facility]

## 2023-03-14 ENCOUNTER — Encounter: Payer: Self-pay | Admitting: Gastroenterology

## 2023-03-15 ENCOUNTER — Encounter
Admission: RE | Disposition: A | Discharge status: Home / Self Care | Payer: Self-pay | Source: Home / Self Care | Attending: Gastroenterology

## 2023-03-15 ENCOUNTER — Ambulatory Visit: Payer: No Typology Code available for payment source | Admitting: Anesthesiology

## 2023-03-15 ENCOUNTER — Ambulatory Visit
Admission: RE | Admit: 2023-03-15 | Discharge: 2023-03-15 | Disposition: A | Discharge status: Home / Self Care | Payer: No Typology Code available for payment source | Attending: Gastroenterology | Admitting: Gastroenterology

## 2023-03-15 ENCOUNTER — Encounter: Payer: Self-pay | Admitting: Gastroenterology

## 2023-03-15 ENCOUNTER — Other Ambulatory Visit: Payer: Self-pay

## 2023-03-15 DIAGNOSIS — I700 Atherosclerosis of aorta: Secondary | ICD-10-CM | POA: Insufficient documentation

## 2023-03-15 DIAGNOSIS — Z7901 Long term (current) use of anticoagulants: Secondary | ICD-10-CM | POA: Diagnosis not present

## 2023-03-15 DIAGNOSIS — Z1211 Encounter for screening for malignant neoplasm of colon: Secondary | ICD-10-CM | POA: Diagnosis not present

## 2023-03-15 DIAGNOSIS — Z794 Long term (current) use of insulin: Secondary | ICD-10-CM | POA: Diagnosis not present

## 2023-03-15 DIAGNOSIS — E119 Type 2 diabetes mellitus without complications: Secondary | ICD-10-CM | POA: Diagnosis not present

## 2023-03-15 DIAGNOSIS — Z7984 Long term (current) use of oral hypoglycemic drugs: Secondary | ICD-10-CM | POA: Insufficient documentation

## 2023-03-15 DIAGNOSIS — I10 Essential (primary) hypertension: Secondary | ICD-10-CM | POA: Insufficient documentation

## 2023-03-15 DIAGNOSIS — E785 Hyperlipidemia, unspecified: Secondary | ICD-10-CM | POA: Insufficient documentation

## 2023-03-15 DIAGNOSIS — Z7985 Long-term (current) use of injectable non-insulin antidiabetic drugs: Secondary | ICD-10-CM | POA: Insufficient documentation

## 2023-03-15 DIAGNOSIS — G4730 Sleep apnea, unspecified: Secondary | ICD-10-CM | POA: Insufficient documentation

## 2023-03-15 DIAGNOSIS — Z833 Family history of diabetes mellitus: Secondary | ICD-10-CM | POA: Diagnosis not present

## 2023-03-15 DIAGNOSIS — E669 Obesity, unspecified: Secondary | ICD-10-CM | POA: Diagnosis not present

## 2023-03-15 DIAGNOSIS — I480 Paroxysmal atrial fibrillation: Secondary | ICD-10-CM | POA: Insufficient documentation

## 2023-03-15 DIAGNOSIS — K219 Gastro-esophageal reflux disease without esophagitis: Secondary | ICD-10-CM | POA: Diagnosis not present

## 2023-03-15 DIAGNOSIS — Z87891 Personal history of nicotine dependence: Secondary | ICD-10-CM | POA: Diagnosis not present

## 2023-03-15 DIAGNOSIS — Z6835 Body mass index (BMI) 35.0-35.9, adult: Secondary | ICD-10-CM | POA: Insufficient documentation

## 2023-03-15 HISTORY — PX: COLONOSCOPY WITH PROPOFOL: SHX5780

## 2023-03-15 LAB — GLUCOSE, CAPILLARY: Glucose-Capillary: 205 mg/dL — ABNORMAL HIGH (ref 70–99)

## 2023-03-15 SURGERY — COLONOSCOPY WITH PROPOFOL
Anesthesia: General

## 2023-03-15 MED ORDER — LIDOCAINE HCL (CARDIAC) PF 100 MG/5ML IV SOSY
PREFILLED_SYRINGE | INTRAVENOUS | Status: DC | PRN
  Administered 2023-03-15: 50 mg via INTRAVENOUS

## 2023-03-15 MED ORDER — PROPOFOL 10 MG/ML IV BOLUS
INTRAVENOUS | Status: DC | PRN
  Administered 2023-03-15: 100 mg via INTRAVENOUS
  Administered 2023-03-15: 130 ug/kg/min via INTRAVENOUS

## 2023-03-15 MED ORDER — PROPOFOL 10 MG/ML IV BOLUS
INTRAVENOUS | Status: AC
  Filled 2023-03-15: qty 20

## 2023-03-15 MED ORDER — SODIUM CHLORIDE 0.9 % IV SOLN: INTRAVENOUS | Status: DC

## 2023-03-15 NOTE — Anesthesia Preprocedure Evaluation (Signed)
Anesthesia Evaluation  Patient identified by MRN, date of birth, ID band Patient awake    Reviewed: Allergy & Precautions, NPO status , Patient's Chart, lab work & pertinent test results  History of Anesthesia Complications Negative for: history of anesthetic complications  Airway Mallampati: III  TM Distance: >3 FB Neck ROM: full    Dental  (+) Chipped, Upper Dentures, Poor Dentition, Missing, Partial Lower   Pulmonary neg shortness of breath, sleep apnea , former smoker   Pulmonary exam normal        Cardiovascular Exercise Tolerance: Good hypertension, (-) angina Normal cardiovascular exam     Neuro/Psych  PSYCHIATRIC DISORDERS      negative neurological ROS     GI/Hepatic Neg liver ROS,GERD  Controlled,,  Endo/Other  diabetes, Type 2    Renal/GU negative Renal ROS  negative genitourinary   Musculoskeletal   Abdominal   Peds  Hematology negative hematology ROS (+)   Anesthesia Other Findings Past Medical History: 07/26/2018: Adrenal tumor No date: Aortic atherosclerosis (HCC)     Comment:  a. 07/2018 CT chest: Atherosclerotic calcifications of               the thoracic aorta. No date: Chest pain     Comment:  a. 05/2014 Ex MV: no evidence of significant ischemia, GI              uptake was noted, EF 51%, no ECG changes concerning for               ischemia, normal study; b. 05/2015 Ex MV: No               ischemia/infarct. EF 38% (2/2 atten artifact-->EF 55-60%               by echo 05/2015). No date: Decreased libido No date: Diastolic dysfunction     Comment:  a. 05/2015 Echo: EF 55-60%, mild LVH. Mild MR. Mildly dil              LA/RV; b. 02/2021 Echo: EF 55-60%, no rwma, mild-mod LVH,              GrI DD, nl RV size/fxn. No signif valvular dzs. No date: HTN (hypertension) No date: Hyperlipidemia No date: IDDM (insulin dependent diabetes mellitus) No date: Obesity No date: PAF (paroxysmal atrial  fibrillation) (HCC)     Comment:  a. Dx 12/2020. CHA2DS2VASc = 2-3 (HTN/DM/Ao               atherosclerosis); b. 02/2021 Zio: RSR, rare PACs/PVCs. No              Afib or other significant arrhythmias. No date: Pharyngitis No date: Sleep apnea     Comment:  uses CPAP  Past Surgical History: No date: AV FISTULA REPAIR No date: CARDIAC CATHETERIZATION     Comment:  [facility]  2013: COLONOSCOPY 12/19/2020: COLONOSCOPY WITH PROPOFOL; N/A     Comment:  Procedure: COLONOSCOPY WITH PROPOFOL;  Surgeon: Wyline Mood, MD;  Location: [facility] ENDOSCOPY;  Service:               Gastroenterology;  Laterality: N/A;  BMI    Body Mass Index: 35.62 kg/m      Reproductive/Obstetrics negative OB ROS  Anesthesia Physical Anesthesia Plan  ASA: 3  Anesthesia Plan: General   Post-op Pain Management:    Induction: Intravenous  PONV Risk Score and Plan: Propofol infusion and TIVA  Airway Management Planned: Natural Airway and Nasal Cannula  Additional Equipment:   Intra-op Plan:   Post-operative Plan:   Informed Consent: I have reviewed the patients History and Physical, chart, labs and discussed the procedure including the risks, benefits and alternatives for the proposed anesthesia with the patient or authorized representative who has indicated his/her understanding and acceptance.     Dental Advisory Given  Plan Discussed with: Anesthesiologist, CRNA and Surgeon  Anesthesia Plan Comments: (Patient consented for risks of anesthesia including but not limited to:  - adverse reactions to medications - risk of airway placement if required - damage to eyes, teeth, lips or other oral mucosa - nerve damage due to positioning  - sore throat or hoarseness - Damage to heart, brain, nerves, lungs, other parts of body or loss of life  Patient voiced understanding and assent.)       Anesthesia Quick Evaluation

## 2023-03-15 NOTE — H&P (Signed)
Wyline Mood, MD [address], [address], [place], [place], [address] [address], [place], [place], [address] Phone: [phone]  Fax: [phone]  Primary Care Physician:  Margarita Mail, DO   Pre-Procedure History & Physical: HPI:  Marvin Duncan. is a 67 y.o. male is here for an colonoscopy.   Past Medical History:  Diagnosis Date   Adrenal tumor 07/26/2018   Aortic atherosclerosis (HCC)    a. 07/2018 CT chest: Atherosclerotic calcifications of the thoracic aorta.   Chest pain    a. 05/2014 Ex MV: no evidence of significant ischemia, GI uptake was noted, EF 51%, no ECG changes concerning for ischemia, normal study; b. 05/2015 Ex MV: No ischemia/infarct. EF 38% (2/2 atten artifact-->EF 55-60% by echo 05/2015).   Decreased libido    Diastolic dysfunction    a. 05/2015 Echo: EF 55-60%, mild LVH. Mild MR. Mildly dil LA/RV; b. 02/2021 Echo: EF 55-60%, no rwma, mild-mod LVH, GrI DD, nl RV size/fxn. No signif valvular dzs.   HTN (hypertension)    Hyperlipidemia    IDDM (insulin dependent diabetes mellitus)    Obesity    PAF (paroxysmal atrial fibrillation) (HCC)    a. Dx 12/2020. CHA2DS2VASc = 2-3 (HTN/DM/Ao atherosclerosis); b. 02/2021 Zio: RSR, rare PACs/PVCs. No Afib or other significant arrhythmias.   Pharyngitis    Sleep apnea    uses CPAP    Past Surgical History:  Procedure Laterality Date   AV FISTULA REPAIR     CARDIAC CATHETERIZATION     [facility]    COLONOSCOPY  2013   COLONOSCOPY WITH PROPOFOL N/A 12/19/2020   Procedure: COLONOSCOPY WITH PROPOFOL;  Surgeon: Wyline Mood, MD;  Location: [facility] ENDOSCOPY;  Service: Gastroenterology;  Laterality: N/A;    Prior to Admission medications   Medication Sig Start Date End Date Taking? Authorizing Provider  amLODipine (NORVASC) 5 MG tablet TAKE 1 TABLET(5 MG) BY MOUTH DAILY 01/24/23  Yes Margarita Mail, DO  Cholecalciferol (VITAMIN D) 2000 UNITS tablet Take 2,000 Units by mouth daily.   Yes [provider]  docusate sodium (COLACE) 100 MG capsule Take 1 capsule (100 mg total) by mouth 2 (two) times daily. 01/08/23 01/08/24 Yes Chesley Noon, MD  Flaxseed, Linseed, (FLAXSEED OIL) 1000 MG CAPS Take 1,000 mg by mouth daily.    Yes [provider]  glipiZIDE (GLUCOTROL) 10 MG tablet TAKE 1 TABLET BY MOUTH TWICE DAILY BEFORE A MEAL FOR HIGH SUGARS 09/27/22  Yes Margarita Mail, DO  losartan (COZAAR) 100 MG tablet TAKE 1 TABLET(100 MG) BY MOUTH DAILY 09/27/22  Yes Margarita Mail, DO  metFORMIN (GLUCOPHAGE-XR) 500 MG 24 hr tablet Take 2 tablets (1,000 mg total) by mouth 2 (two) times daily with a meal. 09/27/22  Yes Margarita Mail, DO  metoprolol tartrate (LOPRESSOR) 25 MG tablet TAKE 1 TABLET(25 MG) BY MOUTH TWICE DAILY 02/03/23  Yes Iran Ouch, MD  Omega-3 Fatty Acids (FISH OIL) 1000 MG CAPS Take 1,000 mg by mouth daily.   Yes [provider]  rosuvastatin (CRESTOR) 20 MG tablet TAKE 1 TABLET(20 MG) BY MOUTH AT BEDTIME 09/27/22  Yes Margarita Mail, DO  traZODone (DESYREL) 100 MG tablet TAKE 1 TABLET(100 MG) BY MOUTH AT BEDTIME 09/27/22  Yes Margarita Mail, DO  vitamin B-12 (CYANOCOBALAMIN) 1000 MCG tablet Take 1,000 mcg by mouth daily.   Yes [provider]  ACCU-CHEK GUIDE test strip USE UP TO FOUR TIMES DAILY AS DIRECTED 12/11/21   Margarita Mail, DO  Accu-Chek Softclix  Lancets lancets USE TO TEST BLOOD SUGAR UP TO FOUR TIMES DAILY AS DIRECTED 12/11/21   Margarita Mail, DO  apixaban (ELIQUIS) 5 MG TABS tablet TAKE 1 TABLET(5 MG) BY MOUTH TWICE DAILY 11/11/22   Iran Ouch, MD  blood glucose meter kit and supplies Dispense based on patient and insurance preference. Use up to four times daily as directed. (FOR ICD-10 E10.9, E11.9). 11/20/21   Margarita Mail, DO  hydrocortisone (ANUSOL-HC) 25 MG suppository Place 1 suppository (25 mg total) rectally every 12 (twelve) hours. 01/08/23 01/08/24  Chesley Noon, MD  phenazopyridine  (PYRIDIUM) 200 MG tablet Take 1 tablet (200 mg total) by mouth 3 (three) times daily as needed for pain. Patient not taking: Reported on 01/11/2023 05/26/22   Mecum, Erin E, PA-C  tirzepatide [facility]) 5 MG/0.5ML Pen Inject 5 mg into the skin once a week. 01/11/23   Margarita Mail, DO    Allergies as of 02/08/2023 - Review Complete 01/11/2023  Allergen Reaction Noted   Atorvastatin Other (See Comments) 04/26/2014    Family History  Problem Relation Age of Onset   Breast cancer Mother    Cancer Mother    Kidney failure Father    Diabetes Father    Alcohol abuse Father    Diabetes Sister    Diabetes Sister    Other Daughter     Social History   Socioeconomic History   Marital status: Married    Spouse name: Glenda   Number of children: 3   Years of education: Not on file   Highest education level: Some college, no degree  Occupational History   Not on file  Tobacco Use   Smoking status: Former    Current packs/day: 0.00    Average packs/day: 0.3 packs/day for 25.0 years (6.3 ttl pk-yrs)    Types: Cigarettes    Start date: 05/17/1973    Quit date: 05/17/1998    Years since quitting: 24.8   Smokeless tobacco: Never   Tobacco comments:    off and on - maybe 5 cigarettes  Vaping Use   Vaping status: Never Used  Substance and Sexual Activity   Alcohol use: No    Alcohol/week: 0.0 standard drinks of alcohol    Comment: previously but not currently   Drug use: No   Sexual activity: Yes    Partners: Female  Other Topics Concern   Not on file  Social History Narrative   Not on file   Social Determinants of Health   Financial Resource Strain: Low Risk  (10/16/2020)   Overall Financial Resource Strain (CARDIA)    Difficulty of Paying Living Expenses: Not hard at all  Food Insecurity: No Food Insecurity (10/16/2020)   Hunger Vital Sign    Worried About Running Out of Food in the Last Year: Never true    Ran Out of Food in the Last Year: Never true  Transportation Needs:  No Transportation Needs (10/16/2020)   PRAPARE - Administrator, Civil Service (Medical): No    Lack of Transportation (Non-Medical): No  Physical Activity: Insufficiently Active (10/16/2020)   Exercise Vital Sign    Days of Exercise per Week: 2 days    Minutes of Exercise per Session: 30 min  Stress: No Stress Concern Present (10/16/2020)   [employer] of Occupational Health - Occupational Stress Questionnaire    Feeling of Stress : Only a little  Social Connections: Socially Integrated (10/16/2020)   Social Connection and Isolation Panel [NHANES]    Frequency  of Communication with Friends and Family: Twice a week    Frequency of Social Gatherings with Friends and Family: Twice a week    Attends Religious Services: More than 4 times per year    Active Member of [employer] or Organizations: Yes    Attends Engineer, structural: More than 4 times per year    Marital Status: Married  Catering manager Violence: Not At Risk (10/16/2020)   Humiliation, Afraid, Rape, and Kick questionnaire    Fear of Current or Ex-Partner: No    Emotionally Abused: No    Physically Abused: No    Sexually Abused: No    Review of Systems: See HPI, otherwise negative ROS  Physical Exam: BP (!) 156/83   Pulse 81   Temp 97.6 F (36.4 C) (Temporal)   Ht 6\' 1"  (1.854 m)   Wt 122.5 kg   SpO2 99%   BMI 35.62 kg/m  General:   Alert,  pleasant and cooperative in NAD Head:  Normocephalic and atraumatic. Neck:  Supple; no masses or thyromegaly. Lungs:  Clear throughout to auscultation, normal respiratory effort.    Heart:  +S1, +S2, Regular rate and rhythm, No edema. Abdomen:  Soft, nontender and nondistended. Normal bowel sounds, without guarding, and without rebound.   Neurologic:  Alert and  oriented x4;  grossly normal neurologically.  Impression/Plan: Marvin Cookey Sr. is here for an colonoscopy to be performed for Screening colonoscopy average risk   Risks, benefits, limitations, and  alternatives regarding  colonoscopy have been reviewed with the patient.  Questions have been answered.  All parties agreeable.   Wyline Mood, MD  03/15/2023, 9:48 AM

## 2023-03-15 NOTE — Anesthesia Postprocedure Evaluation (Signed)
Anesthesia Post Note  Patient: Marvin BOUSE Sr.  Procedure(s) Performed: COLONOSCOPY WITH PROPOFOL  Patient location during evaluation: Endoscopy Anesthesia Type: General Level of consciousness: awake and alert Pain management: pain level controlled Vital Signs Assessment: post-procedure vital signs reviewed and stable Respiratory status: spontaneous breathing, nonlabored ventilation, respiratory function stable and patient connected to nasal cannula oxygen Cardiovascular status: blood pressure returned to baseline and stable Postop Assessment: no apparent nausea or vomiting Anesthetic complications: no   No notable events documented.   Last Vitals:  Vitals:   03/15/23 1058 03/15/23 1108  BP: 108/76 133/88  Pulse: 94 (!) 159  Resp: 16 15  Temp: (!) 35.8 C   SpO2: 97% 96%    Last Pain:  Vitals:   03/15/23 1108  TempSrc:   PainSc: 0-No pain                 Cleda Mccreedy Abdelrahman Nair

## 2023-03-15 NOTE — Op Note (Signed)
[facility] Gastroenterology Patient Name: Marvin Duncan Procedure Date: 03/15/2023 10:31 AM MRN: [id number] Account #: [id number] Date of Birth: [birth year] Admit Type: Outpatient Age: 67 Room: [facility] ENDO ROOM 2 Gender: Male Note Status: Finalized Instrument Name: Prentice Docker [id number] Procedure:             Colonoscopy Indications:           Screening for colorectal malignant neoplasm Providers:             Wyline Mood MD, MD Referring MD:          Margarita Mail (Referring MD) Medicines:             Monitored Anesthesia Care Complications:         No immediate complications. Procedure:             Pre-Anesthesia Assessment:                        - Prior to the procedure, a History and Physical was                         performed, and patient medications, allergies and                         sensitivities were reviewed. The patient's tolerance                         of previous anesthesia was reviewed.                        - The risks and benefits of the procedure and the                         sedation options and risks were discussed with the                         patient. All questions were answered and informed                         consent was obtained.                        - ASA Grade Assessment: II - A patient with mild                         systemic disease.                        After obtaining informed consent, the colonoscope was                         passed under direct vision. Throughout the procedure,                         the patient's blood pressure, pulse, and oxygen                         saturations were monitored continuously. The                         Colonoscope was introduced through  the anus and                         advanced to the the cecum, identified by the                         appendiceal orifice. The colonoscopy was performed                         with ease. The patient tolerated the procedure well.                          The quality of the bowel preparation was excellent.                         The ileocecal valve, appendiceal orifice, and rectum                         were photographed. Findings:      The perianal and digital rectal examinations were normal.      The entire examined colon appeared normal on direct and retroflexion       views. Impression:            - The entire examined colon is normal on direct and                         retroflexion views.                        - No specimens collected. Recommendation:        - Discharge patient to home (with escort).                        - Resume previous diet.                        - Continue present medications.                        - Repeat colonoscopy in 10 years for screening                         purposes. Procedure Code(s):     --- Professional ---                        [phone], Colonoscopy, flexible; diagnostic, including                         collection of specimen(s) by brushing or washing, when                         performed (separate procedure) Diagnosis Code(s):     --- Professional ---                        Z12.11, Encounter for screening for malignant neoplasm                         of colon CPT copyright 2022 American Medical Association. All rights reserved. The codes documented in this report are  preliminary and upon coder review may  be revised to meet current compliance requirements. Wyline Mood, MD Wyline Mood MD, MD 03/15/2023 10:55:27 AM This report has been signed electronically. Number of Addenda: 0 Note Initiated On: 03/15/2023 10:31 AM Scope Withdrawal Time: 0 hours 9 minutes 11 seconds  Total Procedure Duration: 0 hours 11 minutes 46 seconds  Estimated Blood Loss:  Estimated blood loss: none.      [facility]

## 2023-03-15 NOTE — Transfer of Care (Signed)
Immediate Anesthesia Transfer of Care Note  Patient: Marvin WORMAN Sr.  Procedure(s) Performed: COLONOSCOPY WITH PROPOFOL  Patient Location: PACU and Endoscopy Unit  Anesthesia Type:General  Level of Consciousness: drowsy and patient cooperative  Airway & Oxygen Therapy: Patient Spontanous Breathing  Post-op Assessment: Report given to RN and Post -op Vital signs reviewed and stable  Post vital signs: Reviewed and stable  Last Vitals:  Vitals Value Taken Time  BP 108/76 03/15/23 1058  Temp 35.8 C 03/15/23 1058  Pulse 89 03/15/23 1059  Resp 16 03/15/23 1059  SpO2 99 % 03/15/23 1059  Vitals shown include unfiled device data.  Last Pain:  Vitals:   03/15/23 1058  TempSrc:   PainSc: Asleep         Complications: No notable events documented.

## 2023-03-16 ENCOUNTER — Encounter: Payer: Self-pay | Admitting: Gastroenterology

## 2023-03-20 ENCOUNTER — Other Ambulatory Visit: Payer: Self-pay | Admitting: Internal Medicine

## 2023-03-20 DIAGNOSIS — E1165 Type 2 diabetes mellitus with hyperglycemia: Secondary | ICD-10-CM

## 2023-03-22 NOTE — Telephone Encounter (Signed)
Requested Prescriptions  Pending Prescriptions Disp Refills   metFORMIN (GLUCOPHAGE-XR) 500 MG 24 hr tablet [Pharmacy Med Name: METFORMIN ER 500MG  24HR TABS] 360 tablet 1    Sig: TAKE 2 TABLETS(1000 MG) BY MOUTH TWICE DAILY WITH A MEAL     Endocrinology:  Diabetes - Biguanides Failed - 03/20/2023 11:30 AM      Failed - HBA1C is between 0 and 7.9 and within 180 days    Hemoglobin A1C  Date Value Ref Range Status  01/11/2023 11.6 (A) 4.0 - 5.6 % Final   HbA1c, POC (controlled diabetic range)  Date Value Ref Range Status  03/01/2019 6.5 0.0 - 7.0 % Final   Hgb A1c MFr Bld  Date Value Ref Range Status  06/28/2022 7.5 (H) 4.8 - 5.6 % Final    Comment:             Prediabetes: 5.7 - 6.4          Diabetes: >6.4          Glycemic control for adults with diabetes: <7.0          Failed - B12 Level in normal range and within 720 days    Vitamin B-12  Date Value Ref Range Status  12/23/2017 782 232 - 1,245 pg/mL Final         Passed - Cr in normal range and within 360 days    Creat  Date Value Ref Range Status  06/26/2018 1.15 0.70 - 1.25 mg/dL Final    Comment:    For patients >65 years of age, the reference limit for Creatinine is approximately 13% higher for people identified as African-American. .    Creatinine, Ser  Date Value Ref Range Status  01/08/2023 1.21 0.61 - 1.24 mg/dL Final   Creatinine, Urine  Date Value Ref Range Status  12/26/2015 113 20 - 370 mg/dL Final         Passed - eGFR in normal range and within 360 days    GFR, Est African American  Date Value Ref Range Status  06/26/2018 79 > OR = 60 mL/min/1.36m2 Final   GFR calc Af Amer  Date Value Ref Range Status  04/04/2020 79 >59 mL/min/1.73 Final    Comment:    **In accordance with recommendations from the NKF-ASN Task force,**   [facility] is in the process of updating its eGFR calculation to the   2021 CKD-EPI creatinine equation that estimates kidney function   without a race variable.    GFR,  Est Non African American  Date Value Ref Range Status  06/26/2018 68 > OR = 60 mL/min/1.56m2 Final   GFR, Estimated  Date Value Ref Range Status  01/08/2023 >60 >60 mL/min Final    Comment:    (NOTE) Calculated using the CKD-EPI Creatinine Equation (2021)    eGFR  Date Value Ref Range Status  08/27/2022 67 >59 mL/min/1.73 Final         Passed - Valid encounter within last 6 months    Recent Outpatient Visits           2 months ago Uncontrolled type 2 diabetes mellitus with hyperglycemia [facility])   [facility] Margarita Mail, DO   5 months ago Uncontrolled type 2 diabetes mellitus with hyperglycemia [facility])   [facility] Margarita Mail, DO   6 months ago Cellulitis of second toe, left   [facility] Danelle Berry, PA-C   6 months ago Pain and  swelling of toe, left   [facility] Danelle Berry, PA-C   8 months ago Acute prostatitis   [facility] Margarita Mail, DO       Future Appointments             In 1 month Margarita Mail, DO [facility]            Passed - CBC within normal limits and completed in the last 12 months    WBC  Date Value Ref Range Status  01/08/2023 6.5 4.0 - 10.5 K/uL Final   RBC  Date Value Ref Range Status  01/08/2023 4.70 4.22 - 5.81 MIL/uL Final   Hemoglobin  Date Value Ref Range Status  01/08/2023 13.3 13.0 - 17.0 g/dL Final  16/02/9603 54.0 13.0 - 17.7 g/dL Final   HCT  Date Value Ref Range Status  01/08/2023 40.7 39.0 - 52.0 % Final   Hematocrit  Date Value Ref Range Status  08/27/2022 41.5 37.5 - 51.0 % Final   MCHC  Date Value Ref Range Status  01/08/2023 32.7 30.0 - 36.0 g/dL Final   [facility]  Date Value Ref Range Status  01/08/2023 28.3 26.0 - 34.0 pg Final   MCV  Date Value Ref Range Status  01/08/2023 86.6 80.0 - 100.0 fL Final  08/27/2022 88  79 - 97 fL Final  04/22/2014 88 80 - 100 fL Final   No results found for: "PLTCOUNTKUC", "LABPLAT", "POCPLA" RDW  Date Value Ref Range Status  01/08/2023 11.1 (L) 11.5 - 15.5 % Final  08/27/2022 11.1 (L) 11.6 - 15.4 % Final  04/22/2014 12.2 11.5 - 14.5 % Final

## 2023-04-07 ENCOUNTER — Other Ambulatory Visit: Payer: Self-pay | Admitting: Internal Medicine

## 2023-04-07 DIAGNOSIS — E1165 Type 2 diabetes mellitus with hyperglycemia: Secondary | ICD-10-CM

## 2023-04-08 NOTE — Telephone Encounter (Signed)
Requested medication (s) are due for refill today: Yes  Requested medication (s) are on the active medication list: Yes  Last refill:  01/11/23  Future visit scheduled: Yes  Notes to clinic:  Manual review.    Requested Prescriptions  Pending Prescriptions Disp Refills   MOUNJARO 5 MG/0.5ML Pen [Pharmacy Med Name: Greggory Keen 5MG /0.5ML INJ (4 PENS)] 6 mL 0    Sig: ADMINISTER 5 MG UNDER THE SKIN 1 TIME A WEEK     Off-Protocol Failed - 04/07/2023  8:21 PM      Failed - Medication not assigned to a protocol, review manually.      Passed - Valid encounter within last 12 months    Recent Outpatient Visits           2 months ago Uncontrolled type 2 diabetes mellitus with hyperglycemia [facility])   [facility] Margarita Mail, DO   6 months ago Uncontrolled type 2 diabetes mellitus with hyperglycemia [facility])   [facility] Margarita Mail, DO   7 months ago Cellulitis of second toe, left   [facility] Danelle Berry, PA-C   7 months ago Pain and swelling of toe, left   [facility] Danelle Berry, PA-C   8 months ago Acute prostatitis   [facility] Margarita Mail, DO       Future Appointments             In 1 week Margarita Mail, DO [facility]

## 2023-04-19 NOTE — Progress Notes (Unsigned)
Established Patient Office Visit  Subjective:  Patient ID: Marvin Duncan., male    DOB: [birth year]  Age: 67 y.o. MRN: [id number]  CC:  No chief complaint on file.   HPI Marvin Duncan Sr. presents for follow up on chronic medical conditions.  Patient was seen in the ER on 01/08/2023 for rectal bleeding.  Physical exam was consistent with hemorrhoids and he was prescribed hydrocortisone suppository and Colace.  Patient states he no longer having the bleeding but does point to be referred to GI for his regularly scheduled colonoscopy.  Diabetes, Type 2: -Last A1c 8/24 11.6% -Medications: Glipizide 10 mg, Metformin 1000 mg XR twice daily, Actos 15 mg, and increased Mounjaro to 5 mg at LOV. Patient received the medication and is doing well with it so far, no side effects.  -Failed Meds: Jardiance due to UTI's. had been on Trulicity  4.5 mg weekly but not available -Patient is compliant with the above medications and reports no side effects, other than diarrhea.   -Diet: working on just drinking water, stopped juice, not changed. -Eye exam: 5/23, due. Will call to schedule  -Microalbumin: UTD 2/24 -Foot exam: Due -Statin: yes -PNA vaccine: Prevnar 13 and 23 in the past, politely declines Prevnar 20 -Denies symptoms of hypoglycemia, polydipsia, numbness extremities, foot ulcers/trauma.   A.Fib: -Currently on Lopressor 25 mg, Eliquis 5 mg BID -Compliant with above medications and denies adverse effects, denies abnormal bleeding.  -Following with Cardiology, note reviewed 08/05/22.   Hypertension/OSA: -Medications: Amlodipine 5 mg, Losartan 100 mg, Lopressor 25 mg -Patient is compliant with above medications and reports no side effects. -Checking BP at home (average): not checking  -Denies any SOB, CP, vision changes, LE edema or symptoms of hypotension -Compliant with CPAP   HLD: -Medications: Crestor 20 mg -Patient is compliant with above medications and reports no side effects.   -Last lipid panel:   Lipid Panel     Component Value Date/Time   CHOL 123 06/28/2022 0954   TRIG 85 06/28/2022 0954   HDL 46 06/28/2022 0954   CHOLHDL 2.7 06/28/2022 0954   CHOLHDL 3.4 06/26/2018 0818   LDLCALC 60 06/28/2022 0954   LDLCALC 83 06/26/2018 0818   LABVLDL 17 06/28/2022 0954   GERD: -Currently on Pepcid OTC -Symptoms well controlled.    Seasonal Allergies/PND: -Using Flonase    Health Maintenance:  -Blood work UTD -Colon cancer screening  Past Medical History:  Diagnosis Date   Adrenal tumor 07/26/2018   Aortic atherosclerosis (HCC)    a. 07/2018 CT chest: Atherosclerotic calcifications of the thoracic aorta.   Chest pain    a. 05/2014 Ex MV: no evidence of significant ischemia, GI uptake was noted, EF 51%, no ECG changes concerning for ischemia, normal study; b. 05/2015 Ex MV: No ischemia/infarct. EF 38% (2/2 atten artifact-->EF 55-60% by echo 05/2015).   Decreased libido    Diastolic dysfunction    a. 05/2015 Echo: EF 55-60%, mild LVH. Mild MR. Mildly dil LA/RV; b. 02/2021 Echo: EF 55-60%, no rwma, mild-mod LVH, GrI DD, nl RV size/fxn. No signif valvular dzs.   HTN (hypertension)    Hyperlipidemia    IDDM (insulin dependent diabetes mellitus)    Obesity    PAF (paroxysmal atrial fibrillation) (HCC)    a. Dx 12/2020. CHA2DS2VASc = 2-3 (HTN/DM/Ao atherosclerosis); b. 02/2021 Zio: RSR, rare PACs/PVCs. No Afib or other significant arrhythmias.   Pharyngitis    Sleep apnea    uses CPAP    Past Surgical  History:  Procedure Laterality Date   AV FISTULA REPAIR     CARDIAC CATHETERIZATION     [facility]    COLONOSCOPY  2013   COLONOSCOPY WITH PROPOFOL N/A 12/19/2020   Procedure: COLONOSCOPY WITH PROPOFOL;  Surgeon: Wyline Mood, MD;  Location[facility] ENDOSCOPY;  Service: Gastroenterology;  Laterality: N/A;   COLONOSCOPY WITH PROPOFOL N/A 03/15/2023   Procedure: COLONOSCOPY WITH PROPOFOL;  Surgeon: Wyline Mood, MD;  Location: [facility] ENDOSCOPY;  Service: Gastroenterology;   Laterality: N/A;    Family History  Problem Relation Age of Onset   Breast cancer Mother    Cancer Mother    Kidney failure Father    Diabetes Father    Alcohol abuse Father    Diabetes Sister    Diabetes Sister    Other Daughter     Social History   Socioeconomic History   Marital status: Married    Spouse name: Glenda   Number of children: 3   Years of education: Not on file   Highest education level: Some college, no degree  Occupational History   Not on file  Tobacco Use   Smoking status: Former    Current packs/day: 0.00    Average packs/day: 0.3 packs/day for 25.0 years (6.3 ttl pk-yrs)    Types: Cigarettes    Start date: 05/17/1973    Quit date: 05/17/1998    Years since quitting: 24.9   Smokeless tobacco: Never   Tobacco comments:    off and on - maybe 5 cigarettes  Vaping Use   Vaping status: Never Used  Substance and Sexual Activity   Alcohol use: No    Alcohol/week: 0.0 standard drinks of alcohol    Comment: previously but not currently   Drug use: No   Sexual activity: Yes    Partners: Female  Other Topics Concern   Not on file  Social History Narrative   Not on file   Social Determinants of Health   Financial Resource Strain: Low Risk  (10/16/2020)   Overall Financial Resource Strain (CARDIA)    Difficulty of Paying Living Expenses: Not hard at all  Food Insecurity: No Food Insecurity (10/16/2020)   Hunger Vital Sign    Worried About Running Out of Food in the Last Year: Never true    Ran Out of Food in the Last Year: Never true  Transportation Needs: No Transportation Needs (10/16/2020)   PRAPARE - Administrator, Civil Service (Medical): No    Lack of Transportation (Non-Medical): No  Physical Activity: Insufficiently Active (10/16/2020)   Exercise Vital Sign    Days of Exercise per Week: 2 days    Minutes of Exercise per Session: 30 min  Stress: No Stress Concern Present (10/16/2020)   [employer] of Occupational Health -  Occupational Stress Questionnaire    Feeling of Stress : Only a little  Social Connections: Socially Integrated (10/16/2020)   Social Connection and Isolation Panel [NHANES]    Frequency of Communication with Friends and Family: Twice a week    Frequency of Social Gatherings with Friends and Family: Twice a week    Attends Religious Services: More than 4 times per year    Active Member of [employer] or Organizations: Yes    Attends Banker Meetings: More than 4 times per year    Marital Status: Married  Catering manager Violence: Not At Risk (10/16/2020)   Humiliation, Afraid, Rape, and Kick questionnaire    Fear of Current or Ex-Partner: No  Emotionally Abused: No    Physically Abused: No    Sexually Abused: No    Outpatient Medications Prior to Visit  Medication Sig Dispense Refill   ACCU-CHEK GUIDE test strip USE UP TO FOUR TIMES DAILY AS DIRECTED 100 strip 1   Accu-Chek Softclix Lancets lancets USE TO TEST BLOOD SUGAR UP TO FOUR TIMES DAILY AS DIRECTED 100 each 1   amLODipine (NORVASC) 5 MG tablet TAKE 1 TABLET(5 MG) BY MOUTH DAILY 90 tablet 0   apixaban (ELIQUIS) 5 MG TABS tablet TAKE 1 TABLET(5 MG) BY MOUTH TWICE DAILY 180 tablet 1   blood glucose meter kit and supplies Dispense based on patient and insurance preference. Use up to four times daily as directed. (FOR ICD-10 E10.9, E11.9). 1 each 0   Cholecalciferol (VITAMIN D) 2000 UNITS tablet Take 2,000 Units by mouth daily.     docusate sodium (COLACE) 100 MG capsule Take 1 capsule (100 mg total) by mouth 2 (two) times daily. 60 capsule 2   Flaxseed, Linseed, (FLAXSEED OIL) 1000 MG CAPS Take 1,000 mg by mouth daily.      glipiZIDE (GLUCOTROL) 10 MG tablet TAKE 1 TABLET BY MOUTH TWICE DAILY BEFORE A MEAL FOR HIGH SUGARS 180 tablet 1   hydrocortisone (ANUSOL-HC) 25 MG suppository Place 1 suppository (25 mg total) rectally every 12 (twelve) hours. 12 suppository 1   losartan (COZAAR) 100 MG tablet TAKE 1 TABLET(100 MG) BY  MOUTH DAILY 90 tablet 1   metFORMIN (GLUCOPHAGE-XR) 500 MG 24 hr tablet TAKE 2 TABLETS(1000 MG) BY MOUTH TWICE DAILY WITH A MEAL 360 tablet 1   metoprolol tartrate (LOPRESSOR) 25 MG tablet TAKE 1 TABLET(25 MG) BY MOUTH TWICE DAILY 180 tablet 2   Omega-3 Fatty Acids (FISH OIL) 1000 MG CAPS Take 1,000 mg by mouth daily.     phenazopyridine (PYRIDIUM) 200 MG tablet Take 1 tablet (200 mg total) by mouth 3 (three) times daily as needed for pain. (Patient not taking: Reported on 01/11/2023) 10 tablet 0   rosuvastatin (CRESTOR) 20 MG tablet TAKE 1 TABLET(20 MG) BY MOUTH AT BEDTIME 90 tablet 1   tirzepatide (MOUNJARO) 5 MG/0.5ML Pen ADMINISTER 5 MG UNDER THE SKIN 1 TIME A WEEK 3 mL 0   traZODone (DESYREL) 100 MG tablet TAKE 1 TABLET(100 MG) BY MOUTH AT BEDTIME 90 tablet 1   vitamin B-12 (CYANOCOBALAMIN) 1000 MCG tablet Take 1,000 mcg by mouth daily.     No facility-administered medications prior to visit.    Allergies  Allergen Reactions   Atorvastatin Other (See Comments)    Statins cause headaches and muscle aches    ROS Review of Systems  Constitutional:  Negative for chills and fever.  Gastrointestinal:  Negative for anal bleeding and blood in stool.      Objective:    Physical Exam Constitutional:      Appearance: Normal appearance.  HENT:     Head: Normocephalic and atraumatic.  Eyes:     Conjunctiva/sclera: Conjunctivae normal.  Cardiovascular:     Rate and Rhythm: Normal rate and regular rhythm.     Pulses:          Dorsalis pedis pulses are 2+ on the right side and 2+ on the left side.  Pulmonary:     Effort: Pulmonary effort is normal.     Breath sounds: Normal breath sounds.  Abdominal:     Tenderness: There is no right CVA tenderness or left CVA tenderness.  Musculoskeletal:     Right lower leg: No  edema.     Left lower leg: No edema.     Right foot: Normal range of motion. No deformity, bunion, Charcot foot, foot drop or prominent metatarsal heads.     Left foot:  Normal range of motion. No deformity, bunion, Charcot foot, foot drop or prominent metatarsal heads.  Feet:     Right foot:     Protective Sensation: 3 sites tested.  6 sites sensed.     Skin integrity: Skin integrity normal.     Toenail Condition: Right toenails are normal.     Left foot:     Protective Sensation: 3 sites tested.  6 sites sensed.     Skin integrity: Skin integrity normal.     Toenail Condition: Left toenails are normal.  Skin:    General: Skin is warm and dry.  Neurological:     General: No focal deficit present.     Mental Status: He is alert. Mental status is at baseline.  Psychiatric:        Mood and Affect: Mood normal.        Behavior: Behavior normal.     There were no vitals taken for this visit. Wt Readings from Last 3 Encounters:  03/15/23 270 lb (122.5 kg)  01/11/23 270 lb 14.4 oz (122.9 kg)  09/27/22 261 lb 3.2 oz (118.5 kg)     Health Maintenance Due  Topic Date Due   Zoster Vaccines- Shingrix (1 of 2) Never done   Pneumonia Vaccine 63+ Years old (3 of 3 - PPSV23 or PCV20) 01/10/2022   INFLUENZA VACCINE  12/16/2022   COVID-19 Vaccine (1 - 2023-24 season) Never done     There are no preventive care reminders to display for this patient.  Lab Results  Component Value Date   TSH 1.920 01/05/2021   Lab Results  Component Value Date   WBC 6.5 01/08/2023   HGB 13.3 01/08/2023   HCT 40.7 01/08/2023   MCV 86.6 01/08/2023   PLT 217 01/08/2023   Lab Results  Component Value Date   NA 133 (L) 01/08/2023   K 3.9 01/08/2023   CO2 20 (L) 01/08/2023   GLUCOSE 379 (H) 01/08/2023   BUN 15 01/08/2023   CREATININE 1.21 01/08/2023   BILITOT 0.3 08/27/2022   ALKPHOS 86 08/27/2022   AST 18 08/27/2022   ALT 17 08/27/2022   PROT 6.7 08/27/2022   ALBUMIN 4.1 08/27/2022   CALCIUM 9.0 01/08/2023   ANIONGAP 11 01/08/2023   EGFR 67 08/27/2022   Lab Results  Component Value Date   CHOL 123 06/28/2022   Lab Results  Component Value Date   HDL  46 06/28/2022   Lab Results  Component Value Date   LDLCALC 60 06/28/2022   Lab Results  Component Value Date   TRIG 85 06/28/2022   Lab Results  Component Value Date   CHOLHDL 2.7 06/28/2022   Lab Results  Component Value Date   HGBA1C 11.6 (A) 01/11/2023      Assessment & Plan:   1. Uncontrolled type 2 diabetes mellitus with hyperglycemia (HCC): A1c worse today at 11.6, plan to increase Mounjaro to 5 mg weekly. Foot exam today.   - POCT HgB A1C - HM Diabetes Foot Exam - tirzepatide (MOUNJARO) 5 MG/0.5ML Pen; Inject 5 mg into the skin once a week.  Dispense: 6 mL; Refill: 0  2. Colon cancer screening: Rectal bleeding improved but referral placed for regular screening colonoscopy.   - Ambulatory referral to Gastroenterology  Follow-up: No follow-ups on file.    Margarita Mail, DO

## 2023-04-21 ENCOUNTER — Ambulatory Visit: Payer: No Typology Code available for payment source | Admitting: Internal Medicine

## 2023-04-21 ENCOUNTER — Other Ambulatory Visit: Payer: Self-pay

## 2023-04-21 ENCOUNTER — Encounter: Payer: Self-pay | Admitting: Internal Medicine

## 2023-04-21 VITALS — BP 134/76 | HR 74 | Temp 98.1°F | Resp 16 | Ht 73.5 in | Wt 269.6 lb

## 2023-04-21 DIAGNOSIS — I700 Atherosclerosis of aorta: Secondary | ICD-10-CM | POA: Diagnosis not present

## 2023-04-21 DIAGNOSIS — E1165 Type 2 diabetes mellitus with hyperglycemia: Secondary | ICD-10-CM | POA: Diagnosis not present

## 2023-04-21 DIAGNOSIS — Z7984 Long term (current) use of oral hypoglycemic drugs: Secondary | ICD-10-CM

## 2023-04-21 DIAGNOSIS — I10 Essential (primary) hypertension: Secondary | ICD-10-CM

## 2023-04-21 DIAGNOSIS — E782 Mixed hyperlipidemia: Secondary | ICD-10-CM

## 2023-04-21 DIAGNOSIS — G4700 Insomnia, unspecified: Secondary | ICD-10-CM

## 2023-04-21 LAB — POCT GLYCOSYLATED HEMOGLOBIN (HGB A1C): Hemoglobin A1C: 8.6 % — AB (ref 4.0–5.6)

## 2023-04-21 MED ORDER — AMLODIPINE BESYLATE 5 MG PO TABS: 5.0000 mg | ORAL_TABLET | Freq: Every day | ORAL | 1 refills | Status: DC

## 2023-04-21 MED ORDER — TIRZEPATIDE 7.5 MG/0.5ML ~~LOC~~ SOAJ: 7.5000 mg | SUBCUTANEOUS | 0 refills | Status: DC

## 2023-04-21 MED ORDER — TRAZODONE HCL 100 MG PO TABS: ORAL_TABLET | ORAL | 1 refills | Status: DC

## 2023-04-21 MED ORDER — LOSARTAN POTASSIUM 100 MG PO TABS: ORAL_TABLET | ORAL | 1 refills | Status: DC

## 2023-04-21 MED ORDER — GLIPIZIDE 10 MG PO TABS: ORAL_TABLET | ORAL | 1 refills | Status: DC

## 2023-04-21 MED ORDER — ROSUVASTATIN CALCIUM 20 MG PO TABS: ORAL_TABLET | ORAL | 1 refills | Status: DC

## 2023-05-07 ENCOUNTER — Other Ambulatory Visit: Payer: Self-pay | Admitting: Cardiovascular Disease

## 2023-05-07 DIAGNOSIS — I480 Paroxysmal atrial fibrillation: Secondary | ICD-10-CM

## 2023-05-09 NOTE — Telephone Encounter (Signed)
Prescription refill request for Eliquis received. Indication:afib Last office visit:10/24 Scr:1.21  8/24 Age: 67 Weight:122.3  kg  Prescription refilled

## 2023-07-21 ENCOUNTER — Other Ambulatory Visit: Payer: Self-pay

## 2023-07-21 ENCOUNTER — Encounter: Payer: Self-pay | Admitting: Internal Medicine

## 2023-07-21 ENCOUNTER — Ambulatory Visit: Payer: No Typology Code available for payment source | Admitting: Internal Medicine

## 2023-07-21 VITALS — BP 126/72 | HR 82 | Temp 97.9°F | Resp 16 | Ht 73.5 in | Wt 263.8 lb

## 2023-07-21 DIAGNOSIS — Z125 Encounter for screening for malignant neoplasm of prostate: Secondary | ICD-10-CM | POA: Diagnosis not present

## 2023-07-21 DIAGNOSIS — I10 Essential (primary) hypertension: Secondary | ICD-10-CM | POA: Diagnosis not present

## 2023-07-21 DIAGNOSIS — E1165 Type 2 diabetes mellitus with hyperglycemia: Secondary | ICD-10-CM

## 2023-07-21 DIAGNOSIS — E782 Mixed hyperlipidemia: Secondary | ICD-10-CM | POA: Diagnosis not present

## 2023-07-21 LAB — POCT GLYCOSYLATED HEMOGLOBIN (HGB A1C): Hemoglobin A1C: 9.9 % — AB (ref 4.0–5.6)

## 2023-07-21 NOTE — Assessment & Plan Note (Signed)
 Recheck fasting labs, continue statin.

## 2023-07-21 NOTE — Assessment & Plan Note (Signed)
 Blood pressure stable here today, no changes made to medications and appropriate refills sent to pharmacy.

## 2023-07-21 NOTE — Progress Notes (Signed)
 Established Patient Office Visit  Subjective:  Patient ID: Marvin Duncan., male    DOB: May 14, 1956  Age: 68 y.o. MRN: [id number]  CC:  Chief Complaint  Patient presents with   Medical Management of Chronic Issues    3 month recheck    HPI DEMARCO BACCI Sr. presents for follow up on chronic medical conditions.   Diabetes, Type 2: -Last A1c 12/24 8.6% -Medications: Glipizide 10 mg, Metformin 1000 mg XR twice daily, Actos 15 mg, and increased Mounjaro to 7.5 mg at LOV. Patient received the medication and is doing well with it so far, no side effects.  -Failed Meds: Jardiance due to UTI's. had been on Trulicity  4.5 mg weekly but not available -Patient is compliant with the above medications and reports no side effects. -Diet: working on just drinking water, stopped juice, not changed. Eating one meal a day currently.  -Eye exam: UTD  -Microalbumin: Due -Foot exam: Due -Statin: yes -PNA vaccine: Prevnar 13 and 23 in the past, politely declines Prevnar 20 -Denies symptoms of hypoglycemia, polydipsia, numbness extremities, foot ulcers/trauma.   A.Fib: -Currently on Lopressor 25 mg, Eliquis 5 mg BID -Compliant with above medications and denies adverse effects, denies abnormal bleeding.  -Following with Cardiology, note reviewed 02/24/23.   Hypertension/OSA: -Medications: Amlodipine 5 mg, Losartan 100 mg, Lopressor 25 mg -Patient is compliant with above medications and reports no side effects. -Checking BP at home (average): not checking  -Denies any SOB, CP, vision changes, LE edema or symptoms of hypotension -Compliant with CPAP   HLD: -Medications: Crestor 20 mg -Patient is compliant with above medications and reports no side effects.  -Last lipid panel:   Lipid Panel     Component Value Date/Time   CHOL 123 06/28/2022 0954   TRIG 85 06/28/2022 0954   HDL 46 06/28/2022 0954   CHOLHDL 2.7 06/28/2022 0954   CHOLHDL 3.4 06/26/2018 0818   LDLCALC 60 06/28/2022 0954    LDLCALC 83 06/26/2018 0818   LABVLDL 17 06/28/2022 0954   GERD: -Currently on Pepcid OTC -Symptoms well controlled.    Seasonal Allergies/PND: -Using Flonase    Health Maintenance:  -Blood work due -Colon cancer screening: colonoscopy 10/24, repeat in 10 years   Past Medical History:  Diagnosis Date   Adrenal tumor 07/26/2018   Aortic atherosclerosis (HCC)    a. 07/2018 CT chest: Atherosclerotic calcifications of the thoracic aorta.   Chest pain    a. 05/2014 Ex MV: no evidence of significant ischemia, GI uptake was noted, EF 51%, no ECG changes concerning for ischemia, normal study; b. 05/2015 Ex MV: No ischemia/infarct. EF 38% (2/2 atten artifact-->EF 55-60% by echo 05/2015).   Decreased libido    Diastolic dysfunction    a. 05/2015 Echo: EF 55-60%, mild LVH. Mild MR. Mildly dil LA/RV; b. 02/2021 Echo: EF 55-60%, no rwma, mild-mod LVH, GrI DD, nl RV size/fxn. No signif valvular dzs.   HTN (hypertension)    Hyperlipidemia    IDDM (insulin dependent diabetes mellitus)    Obesity    PAF (paroxysmal atrial fibrillation) (HCC)    a. Dx 12/2020. CHA2DS2VASc = 2-3 (HTN/DM/Ao atherosclerosis); b. 02/2021 Zio: RSR, rare PACs/PVCs. No Afib or other significant arrhythmias.   Pharyngitis    Sleep apnea    uses CPAP    Past Surgical History:  Procedure Laterality Date   AV FISTULA REPAIR     CARDIAC CATHETERIZATION     [facility]    COLONOSCOPY  2013   COLONOSCOPY  WITH PROPOFOL N/A 12/19/2020   Procedure: COLONOSCOPY WITH PROPOFOL;  Surgeon: Wyline Mood, MD;  Location: [facility] ENDOSCOPY;  Service: Gastroenterology;  Laterality: N/A;   COLONOSCOPY WITH PROPOFOL N/A 03/15/2023   Procedure: COLONOSCOPY WITH PROPOFOL;  Surgeon: Wyline Mood, MD;  Location[facility] ENDOSCOPY;  Service: Gastroenterology;  Laterality: N/A;    Family History  Problem Relation Age of Onset   Breast cancer Mother    Cancer Mother    Kidney failure Father    Diabetes Father    Alcohol abuse Father    Diabetes Sister     Diabetes Sister    Other Daughter     Social History   Socioeconomic History   Marital status: Married    Spouse name: Glenda   Number of children: 3   Years of education: Not on file   Highest education level: Some college, no degree  Occupational History   Not on file  Tobacco Use   Smoking status: Former    Current packs/day: 0.00    Average packs/day: 0.3 packs/day for 25.0 years (6.3 ttl pk-yrs)    Types: Cigarettes    Start date: 05/17/1973    Quit date: 05/17/1998    Years since quitting: 25.1   Smokeless tobacco: Never   Tobacco comments:    off and on - maybe 5 cigarettes  Vaping Use   Vaping status: Never Used  Substance and Sexual Activity   Alcohol use: No    Alcohol/week: 0.0 standard drinks of alcohol    Comment: previously but not currently   Drug use: No   Sexual activity: Yes    Partners: Female  Other Topics Concern   Not on file  Social History Narrative   Not on file   Social Drivers of Health   Financial Resource Strain: Low Risk  (10/16/2020)   Overall Financial Resource Strain (CARDIA)    Difficulty of Paying Living Expenses: Not hard at all  Food Insecurity: No Food Insecurity (10/16/2020)   Hunger Vital Sign    Worried About Running Out of Food in the Last Year: Never true    Ran Out of Food in the Last Year: Never true  Transportation Needs: No Transportation Needs (10/16/2020)   PRAPARE - Administrator, Civil Service (Medical): No    Lack of Transportation (Non-Medical): No  Physical Activity: Insufficiently Active (10/16/2020)   Exercise Vital Sign    Days of Exercise per Week: 2 days    Minutes of Exercise per Session: 30 min  Stress: No Stress Concern Present (10/16/2020)   [employer] of Occupational Health - Occupational Stress Questionnaire    Feeling of Stress : Only a little  Social Connections: Socially Integrated (10/16/2020)   Social Connection and Isolation Panel [NHANES]    Frequency of Communication with Friends  and Family: Twice a week    Frequency of Social Gatherings with Friends and Family: Twice a week    Attends Religious Services: More than 4 times per year    Active Member of [employer] or Organizations: Yes    Attends Engineer, structural: More than 4 times per year    Marital Status: Married  Catering manager Violence: Not At Risk (10/16/2020)   Humiliation, Afraid, Rape, and Kick questionnaire    Fear of Current or Ex-Partner: No    Emotionally Abused: No    Physically Abused: No    Sexually Abused: No    Outpatient Medications Prior to Visit  Medication Sig Dispense Refill  amLODipine (NORVASC) 5 MG tablet Take 1 tablet (5 mg total) by mouth daily. 90 tablet 1   Cholecalciferol (VITAMIN D) 2000 UNITS tablet Take 2,000 Units by mouth daily.     docusate sodium (COLACE) 100 MG capsule Take 1 capsule (100 mg total) by mouth 2 (two) times daily. 60 capsule 2   ELIQUIS 5 MG TABS tablet TAKE 1 TABLET(5 MG) BY MOUTH TWICE DAILY 180 tablet 1   Flaxseed, Linseed, (FLAXSEED OIL) 1000 MG CAPS Take 1,000 mg by mouth daily.      glipiZIDE (GLUCOTROL) 10 MG tablet TAKE 1 TABLET BY MOUTH TWICE DAILY BEFORE A MEAL FOR HIGH SUGARS 180 tablet 1   losartan (COZAAR) 100 MG tablet TAKE 1 TABLET(100 MG) BY MOUTH DAILY 90 tablet 1   metFORMIN (GLUCOPHAGE-XR) 500 MG 24 hr tablet TAKE 2 TABLETS(1000 MG) BY MOUTH TWICE DAILY WITH A MEAL 360 tablet 1   metoprolol tartrate (LOPRESSOR) 25 MG tablet TAKE 1 TABLET(25 MG) BY MOUTH TWICE DAILY 180 tablet 2   Omega-3 Fatty Acids (FISH OIL) 1000 MG CAPS Take 1,000 mg by mouth daily.     rosuvastatin (CRESTOR) 20 MG tablet TAKE 1 TABLET(20 MG) BY MOUTH AT BEDTIME 90 tablet 1   tirzepatide (MOUNJARO) 7.5 MG/0.5ML Pen Inject 7.5 mg into the skin once a week. 6 mL 0   traZODone (DESYREL) 100 MG tablet TAKE 1 TABLET(100 MG) BY MOUTH AT BEDTIME 90 tablet 1   vitamin B-12 (CYANOCOBALAMIN) 1000 MCG tablet Take 1,000 mcg by mouth daily.     ACCU-CHEK GUIDE test strip USE  UP TO FOUR TIMES DAILY AS DIRECTED (Patient not taking: Reported on 07/21/2023) 100 strip 1   Accu-Chek Softclix Lancets lancets USE TO TEST BLOOD SUGAR UP TO FOUR TIMES DAILY AS DIRECTED (Patient not taking: Reported on 07/21/2023) 100 each 1   blood glucose meter kit and supplies Dispense based on patient and insurance preference. Use up to four times daily as directed. (FOR ICD-10 E10.9, E11.9). (Patient not taking: Reported on 07/21/2023) 1 each 0   No facility-administered medications prior to visit.    Allergies  Allergen Reactions   Atorvastatin Other (See Comments)    Statins cause headaches and muscle aches    ROS Review of Systems  All other systems reviewed and are negative.     Objective:    Physical Exam Constitutional:      Appearance: Normal appearance.  HENT:     Head: Normocephalic and atraumatic.  Eyes:     Conjunctiva/sclera: Conjunctivae normal.  Cardiovascular:     Rate and Rhythm: Normal rate and regular rhythm.     Pulses:          Dorsalis pedis pulses are 2+ on the right side and 2+ on the left side.  Pulmonary:     Effort: Pulmonary effort is normal.     Breath sounds: Normal breath sounds.  Musculoskeletal:     Right lower leg: No edema.     Left lower leg: No edema.     Right foot: Normal range of motion. No deformity, bunion, Charcot foot, foot drop or prominent metatarsal heads.     Left foot: Normal range of motion. No deformity, bunion, Charcot foot, foot drop or prominent metatarsal heads.  Feet:     Right foot:     Protective Sensation: 8 sites tested.  5 sites sensed.     Skin integrity: Skin integrity normal.     Toenail Condition: Right toenails are normal.  Left foot:     Protective Sensation: 8 sites tested.  4 sites sensed.     Skin integrity: Skin integrity normal.     Toenail Condition: Left toenails are normal.  Skin:    General: Skin is warm and dry.  Neurological:     General: No focal deficit present.     Mental Status:  He is alert. Mental status is at baseline.  Psychiatric:        Mood and Affect: Mood normal.        Behavior: Behavior normal.     BP 126/72 (Cuff Size: Large)   Pulse 82   Temp 97.9 F (36.6 C) (Oral)   Resp 16   Ht 6' 1.5" (1.867 m)   Wt 263 lb 12.8 oz (119.7 kg)   SpO2 99%   BMI 34.33 kg/m  Wt Readings from Last 3 Encounters:  07/21/23 263 lb 12.8 oz (119.7 kg)  04/21/23 269 lb 9.6 oz (122.3 kg)  03/15/23 270 lb (122.5 kg)     Health Maintenance Due  Topic Date Due   Zoster Vaccines- Shingrix (1 of 2) Never done   COVID-19 Vaccine (1 - 2024-25 season) Never done   Diabetic kidney evaluation - Urine ACR  06/29/2023     There are no preventive care reminders to display for this patient.  Lab Results  Component Value Date   TSH 1.920 01/05/2021   Lab Results  Component Value Date   WBC 6.5 01/08/2023   HGB 13.3 01/08/2023   HCT 40.7 01/08/2023   MCV 86.6 01/08/2023   PLT 217 01/08/2023   Lab Results  Component Value Date   NA 133 (L) 01/08/2023   K 3.9 01/08/2023   CO2 20 (L) 01/08/2023   GLUCOSE 379 (H) 01/08/2023   BUN 15 01/08/2023   CREATININE 1.21 01/08/2023   BILITOT 0.3 08/27/2022   ALKPHOS 86 08/27/2022   AST 18 08/27/2022   ALT 17 08/27/2022   PROT 6.7 08/27/2022   ALBUMIN 4.1 08/27/2022   CALCIUM 9.0 01/08/2023   ANIONGAP 11 01/08/2023   EGFR 67 08/27/2022   Lab Results  Component Value Date   CHOL 123 06/28/2022   Lab Results  Component Value Date   HDL 46 06/28/2022   Lab Results  Component Value Date   LDLCALC 60 06/28/2022   Lab Results  Component Value Date   TRIG 85 06/28/2022   Lab Results  Component Value Date   CHOLHDL 2.7 06/28/2022   Lab Results  Component Value Date   HGBA1C 8.6 (A) 04/21/2023      Assessment & Plan:   Uncontrolled type 2 diabetes mellitus with hyperglycemia (HCC) Assessment & Plan: Recheck labs, no changes made to medications. Microalbumin and foot exam due. Discussed starting  Gabapentin 100 mg at bedtime for neuropathy symptoms, patient will think about it but not interested now.   Orders: -     POCT glycosylated hemoglobin (Hb A1C) -     Hemoglobin A1c -     Comprehensive metabolic panel -     Microalbumin / creatinine urine ratio -     HM Diabetes Foot Exam  Benign essential HTN Assessment & Plan: Blood pressure stable here today, no changes made to medications and appropriate refills sent to pharmacy.    Orders: -     CBC with Differential/Platelet -     Comprehensive metabolic panel  Mixed hyperlipidemia Assessment & Plan: Recheck fasting labs, continue statin.  Orders: -  Lipid panel  Prostate cancer screening -     PSA    Follow-up: Return for will call to schedule.    Margarita Mail, DO

## 2023-07-21 NOTE — Assessment & Plan Note (Signed)
 Recheck labs, no changes made to medications. Microalbumin and foot exam due. Discussed starting Gabapentin 100 mg at bedtime for neuropathy symptoms, patient will think about it but not interested now.

## 2023-07-21 NOTE — Patient Instructions (Signed)
 It was great seeing you today!  Plan discussed at today's visit: -Blood work ordered today, results will be uploaded to MyChart.  -Keep medications the same for now -Consider starting gabapentin 100 mg at bedtime for neuropathy  Follow up in: TBD after labs  Take care and let us know if you have any questions or concerns prior to your next visit.  Dr. Caralee Ates  Gabapentin Capsules or Tablets What is this medication? GABAPENTIN (GA ba pen tin) treats nerve pain. It may also be used to prevent and control seizures in people with epilepsy. It works by calming overactive nerves in your body. This medicine may be used for other purposes; ask your health care provider or pharmacist if you have questions. COMMON BRAND NAME(S): Active-PAC with Gabapentin, Ascencion Dike, Gralise, Neurontin What should I tell my care team before I take this medication? They need to know if you have any of these conditions: Kidney disease Lung or breathing disease Substance use disorder Suicidal thoughts, plans, or attempt by you or a family member An unusual or allergic reaction to gabapentin, other medications, foods, dyes, or preservatives Pregnant or trying to get pregnant Breastfeeding How should I use this medication? Take this medication by mouth with a glass of water. Follow the directions on the prescription label. You can take it with or without food. If it upsets your stomach, take it with food. Take your medication at regular intervals. Do not take it more often than directed. Do not stop taking except on your care team's advice. If you are directed to break the 600 or 800 mg tablets in half as part of your dose, the extra half tablet should be used for the next dose. If you have not used the extra half tablet within 28 days, it should be thrown away. A special MedGuide will be given to you by the pharmacist with each prescription and refill. Be sure to read this information carefully each time. Talk to  your care team about the use of this medication in children. While this medication may be prescribed for children as young as 3 years for selected conditions, precautions do apply. Overdosage: If you think you have taken too much of this medicine contact a poison control center or emergency room at once. NOTE: This medicine is only for you. Do not share this medicine with others. What if I miss a dose? If you miss a dose, take it as soon as you can. If it is almost time for your next dose, take only that dose. Do not take double or extra doses. What may interact with this medication? Alcohol Antihistamines for allergy, cough, and cold Certain medications for anxiety or sleep Certain medications for depression like amitriptyline, fluoxetine, sertraline Certain medications for seizures like phenobarbital, primidone Certain medications for stomach problems General anesthetics like halothane, isoflurane, methoxyflurane, propofol Local anesthetics like lidocaine, pramoxine, tetracaine Medications that relax muscles for surgery Opioid medications for pain Phenothiazines like chlorpromazine, mesoridazine, prochlorperazine, thioridazine This list may not describe all possible interactions. Give your health care provider a list of all the medicines, herbs, non-prescription drugs, or dietary supplements you use. Also tell them if you smoke, drink alcohol, or use illegal drugs. Some items may interact with your medicine. What should I watch for while using this medication? Visit your care team for regular checks on your progress. You may want to keep a record at home of how you feel your condition is responding to treatment. You may want to share this information  with your care team at each visit. You should contact your care team if your seizures get worse or if you have any new types of seizures. Do not stop taking this medication or any of your seizure medications unless instructed by your care team.  Stopping your medication suddenly can increase your seizures or their severity. This medication may cause serious skin reactions. They can happen weeks to months after starting the medication. Contact your care team right away if you notice fevers or flu-like symptoms with a rash. The rash may be red or purple and then turn into blisters or peeling of the skin. Or, you might notice a red rash with swelling of the face, lips or lymph nodes in your neck or under your arms. Wear a medical identification bracelet or chain if you are taking this medication for seizures. Carry a card that lists all your medications. This medication may affect your coordination, reaction time, or judgment. Do not drive or operate machinery until you know how this medication affects you. Sit up or stand slowly to reduce the risk of dizzy or fainting spells. Drinking alcohol with this medication can increase the risk of these side effects. Your mouth may get dry. Chewing sugarless gum or sucking hard candy, and drinking plenty of water may help. Watch for new or worsening thoughts of suicide or depression. This includes sudden changes in mood, behaviors, or thoughts. These changes can happen at any time but are more common in the beginning of treatment or after a change in dose. Call your care team right away if you experience these thoughts or worsening depression. If you become pregnant while using this medication, you may enroll in the [place] American Antiepileptic Drug Pregnancy Registry by calling [phone]. This registry collects information about the safety of antiepileptic medication use during pregnancy. What side effects may I notice from receiving this medication? Side effects that you should report to your care team as soon as possible: Allergic reactions or angioedema--skin rash, itching, hives, swelling of the face, eyes, lips, tongue, arms, or legs, trouble swallowing or breathing Rash, fever, and swollen lymph  nodes Thoughts of suicide or self harm, worsening mood, feelings of depression Trouble breathing Unusual changes in mood or behavior in children after use such as difficulty concentrating, hostility, or restlessness Side effects that usually do not require medical attention (report to your care team if they continue or are bothersome): Dizziness Drowsiness Nausea Swelling of ankles, feet, or hands Vomiting This list may not describe all possible side effects. Call your doctor for medical advice about side effects. You may report side effects to FDA at 1-800-FDA-1088. Where should I keep my medication? Keep out of reach of children and pets. Store at room temperature between 15 and 30 degrees C (59 and 86 degrees F). Get rid of any unused medication after the expiration date. This medication may cause accidental overdose and death if taken by other adults, children, or pets. To get rid of medications that are no longer needed or have expired: Take the medication to a medication take-back program. Check with your pharmacy or law enforcement to find a location. If you cannot return the medication, check the label or package insert to see if the medication should be thrown out in the garbage or flushed down the toilet. If you are not sure, ask your care team. If it is safe to put it in the trash, empty the medication out of the container. Mix the medication with cat litter,  dirt, coffee grounds, or other unwanted substance. Seal the mixture in a bag or container. Put it in the trash. NOTE: This sheet is a summary. It may not cover all possible information. If you have questions about this medicine, talk to your doctor, pharmacist, or health care provider.  2024 Elsevier/Gold Standard (2022-02-16 00:00:00)

## 2023-07-22 ENCOUNTER — Other Ambulatory Visit: Payer: Self-pay | Admitting: Internal Medicine

## 2023-07-22 DIAGNOSIS — E1165 Type 2 diabetes mellitus with hyperglycemia: Secondary | ICD-10-CM

## 2023-07-22 MED ORDER — TIRZEPATIDE 10 MG/0.5ML ~~LOC~~ SOAJ: 10.0000 mg | SUBCUTANEOUS | 0 refills | Status: DC

## 2023-07-24 LAB — LIPID PANEL
Chol/HDL Ratio: 2.8 ratio (ref 0.0–5.0)
Cholesterol, Total: 119 mg/dL (ref 100–199)
HDL: 43 mg/dL (ref >39)
LDL Chol Calc (NIH): 61 mg/dL (ref 0–99)
Triglycerides: 71 mg/dL (ref 0–149)
VLDL Cholesterol Cal: 15 mg/dL (ref 5–40)

## 2023-07-24 LAB — CBC WITH DIFFERENTIAL/PLATELET
Basophils Absolute: 0.1 10*3/uL (ref 0.0–0.2)
Basos: 1 %
EOS (ABSOLUTE): 0.1 10*3/uL (ref 0.0–0.4)
Eos: 2 %
Hematocrit: 42.6 % (ref 37.5–51.0)
Hemoglobin: 13.8 g/dL (ref 13.0–17.7)
Immature Grans (Abs): 0 10*3/uL (ref 0.0–0.1)
Immature Granulocytes: 0 %
Lymphocytes Absolute: 2.2 10*3/uL (ref 0.7–3.1)
Lymphs: 35 %
MCH: 27.8 pg (ref 26.6–33.0)
MCHC: 32.4 g/dL (ref 31.5–35.7)
MCV: 86 fL (ref 79–97)
Monocytes Absolute: 0.4 10*3/uL (ref 0.1–0.9)
Monocytes: 6 %
Neutrophils Absolute: 3.6 10*3/uL (ref 1.4–7.0)
Neutrophils: 56 %
Platelets: 266 10*3/uL (ref 150–450)
RBC: 4.96 x10E6/uL (ref 4.14–5.80)
RDW: 11.2 % — ABNORMAL LOW (ref 11.6–15.4)
WBC: 6.3 10*3/uL (ref 3.4–10.8)

## 2023-07-24 LAB — COMPREHENSIVE METABOLIC PANEL
ALT: 17 IU/L (ref 0–44)
AST: 25 IU/L (ref 0–40)
Albumin: 4.4 g/dL (ref 3.9–4.9)
Alkaline Phosphatase: 90 IU/L (ref 44–121)
BUN/Creatinine Ratio: 16 (ref 10–24)
BUN: 18 mg/dL (ref 8–27)
Bilirubin Total: 0.5 mg/dL (ref 0.0–1.2)
CO2: 21 mmol/L (ref 20–29)
Calcium: 9.8 mg/dL (ref 8.6–10.2)
Chloride: 101 mmol/L (ref 96–106)
Creatinine, Ser: 1.16 mg/dL (ref 0.76–1.27)
Globulin, Total: 3 g/dL (ref 1.5–4.5)
Glucose: 199 mg/dL — ABNORMAL HIGH (ref 70–99)
Potassium: 4.8 mmol/L (ref 3.5–5.2)
Sodium: 137 mmol/L (ref 134–144)
Total Protein: 7.4 g/dL (ref 6.0–8.5)
eGFR: 69 mL/min/{1.73_m2} (ref >59)

## 2023-07-24 LAB — MICROALBUMIN / CREATININE URINE RATIO
Creatinine, Urine: 116.5 mg/dL
Microalb/Creat Ratio: 158 mg/g{creat} — ABNORMAL HIGH (ref 0–29)
Microalbumin, Urine: 183.8 ug/mL

## 2023-07-24 LAB — HEMOGLOBIN A1C
Est. average glucose Bld gHb Est-mCnc: 246 mg/dL
Hgb A1c MFr Bld: 10.2 % — ABNORMAL HIGH (ref 4.8–5.6)

## 2023-07-24 LAB — PSA: Prostate Specific Ag, Serum: 0.4 ng/mL (ref 0.0–4.0)

## 2023-08-23 ENCOUNTER — Other Ambulatory Visit: Payer: Self-pay | Admitting: Internal Medicine

## 2023-08-23 DIAGNOSIS — E1165 Type 2 diabetes mellitus with hyperglycemia: Secondary | ICD-10-CM

## 2023-08-24 NOTE — Telephone Encounter (Signed)
 Requested by interface [email or web address]. Last OV 07/21/23.  Requested Prescriptions  Pending Prescriptions Disp Refills   glipiZIDE (GLUCOTROL) 10 MG tablet [Pharmacy Med Name: GLIPIZIDE 10MG  TABLETS] 180 tablet 1    Sig: TAKE 1 TABLET BY MOUTH TWICE DAILY BEFORE A MEAL FOR HIGH SUGAR     Endocrinology:  Diabetes - Sulfonylureas Failed - 08/24/2023  3:01 PM      Failed - HBA1C is between 0 and 7.9 and within 180 days    Hemoglobin A1C  Date Value Ref Range Status  07/21/2023 9.9 (A) 4.0 - 5.6 % Final   HbA1c, POC (controlled diabetic range)  Date Value Ref Range Status  03/01/2019 6.5 0.0 - 7.0 % Final   Hgb A1c MFr Bld  Date Value Ref Range Status  07/21/2023 10.2 (H) 4.8 - 5.6 % Final    Comment:             Prediabetes: 5.7 - 6.4          Diabetes: >6.4          Glycemic control for adults with diabetes: <7.0          Passed - Cr in normal range and within 360 days    Creat  Date Value Ref Range Status  06/26/2018 1.15 0.70 - 1.25 mg/dL Final    Comment:    For patients >64 years of age, the reference limit for Creatinine is approximately 13% higher for people identified as African-American. .    Creatinine, Ser  Date Value Ref Range Status  07/21/2023 1.16 0.76 - 1.27 mg/dL Final   Creatinine, Urine  Date Value Ref Range Status  12/26/2015 113 20 - 370 mg/dL Final         Passed - Valid encounter within last 6 months    Recent Outpatient Visits           1 month ago Uncontrolled type 2 diabetes mellitus with hyperglycemia [facility])   [facility] Margarita Mail, [place]

## 2023-09-26 ENCOUNTER — Other Ambulatory Visit: Payer: Self-pay | Admitting: Internal Medicine

## 2023-09-26 DIAGNOSIS — E1165 Type 2 diabetes mellitus with hyperglycemia: Secondary | ICD-10-CM

## 2023-09-28 NOTE — Telephone Encounter (Signed)
 Requested Prescriptions  Pending Prescriptions Disp Refills   metFORMIN  (GLUCOPHAGE -XR) 500 MG 24 hr tablet [Pharmacy Med Name: METFORMIN  ER 500MG  24HR TABS] 360 tablet 0    Sig: TAKE 2 TABLETS(1000 MG) BY MOUTH TWICE DAILY WITH A MEAL     Endocrinology:  Diabetes - Biguanides Failed - 09/28/2023 12:09 PM      Failed - HBA1C is between 0 and 7.9 and within 180 days    Hemoglobin A1C  Date Value Ref Range Status  07/21/2023 9.9 (A) 4.0 - 5.6 % Final   HbA1c, POC (controlled diabetic range)  Date Value Ref Range Status  03/01/2019 6.5 0.0 - 7.0 % Final   Hgb A1c MFr Bld  Date Value Ref Range Status  07/21/2023 10.2 (H) 4.8 - 5.6 % Final    Comment:             Prediabetes: 5.7 - 6.4          Diabetes: >6.4          Glycemic control for adults with diabetes: <7.0          Failed - B12 Level in normal range and within 720 days    Vitamin B-12  Date Value Ref Range Status  12/23/2017 782 232 - 1,245 pg/mL Final         Passed - Cr in normal range and within 360 days    Creat  Date Value Ref Range Status  06/26/2018 1.15 0.70 - 1.25 mg/dL Final    Comment:    For patients >70 years of age, the reference limit for Creatinine is approximately 13% higher for people identified as African-American. .    Creatinine, Ser  Date Value Ref Range Status  07/21/2023 1.16 0.76 - 1.27 mg/dL Final   Creatinine, Urine  Date Value Ref Range Status  12/26/2015 113 20 - 370 mg/dL Final         Passed - eGFR in normal range and within 360 days    GFR, Est African American  Date Value Ref Range Status  06/26/2018 79 > OR = 60 mL/min/1.70m2 Final   GFR calc Af Amer  Date Value Ref Range Status  04/04/2020 79 >59 mL/min/1.73 Final    Comment:    **In accordance with recommendations from the NKF-ASN Task force,**   [facility] is in the process of updating its eGFR calculation to the   2021 CKD-EPI creatinine equation that estimates kidney function   without a race variable.    GFR,  Est Non African American  Date Value Ref Range Status  06/26/2018 68 > OR = 60 mL/min/1.77m2 Final   GFR, Estimated  Date Value Ref Range Status  01/08/2023 >60 >60 mL/min Final    Comment:    (NOTE) Calculated using the CKD-EPI Creatinine Equation (2021)    eGFR  Date Value Ref Range Status  07/21/2023 69 >59 mL/min/1.73 Final         Passed - Valid encounter within last 6 months    Recent Outpatient Visits           2 months ago Uncontrolled type 2 diabetes mellitus with hyperglycemia [facility])   [facility] [place], [place]              Passed - CBC within normal limits and completed in the last 12 months    WBC  Date Value Ref Range Status  07/21/2023 6.3 3.4 - 10.8 x10E3/uL Final  01/08/2023 6.5 4.0 - 10.5  K/uL Final   RBC  Date Value Ref Range Status  07/21/2023 4.96 4.14 - 5.80 x10E6/uL Final  01/08/2023 4.70 4.22 - 5.81 MIL/uL Final   Hemoglobin  Date Value Ref Range Status  07/21/2023 13.8 13.0 - 17.7 g/dL Final   Hematocrit  Date Value Ref Range Status  07/21/2023 42.6 37.5 - 51.0 % Final   MCHC  Date Value Ref Range Status  07/21/2023 32.4 31.5 - 35.7 g/dL Final  78/29/5621 30.8 30.0 - 36.0 g/dL Final   [facility]  Date Value Ref Range Status  07/21/2023 27.8 26.6 - 33.0 pg Final  01/08/2023 28.3 26.0 - 34.0 pg Final   MCV  Date Value Ref Range Status  07/21/2023 86 79 - 97 fL Final  04/22/2014 88 80 - 100 fL Final   No results found for: "PLTCOUNTKUC", "LABPLAT", "POCPLA" RDW  Date Value Ref Range Status  07/21/2023 11.2 (L) 11.6 - 15.4 % Final  04/22/2014 12.2 11.5 - 14.5 % Final

## 2023-11-03 ENCOUNTER — Other Ambulatory Visit: Payer: Self-pay | Admitting: Internal Medicine

## 2023-11-03 DIAGNOSIS — G4700 Insomnia, unspecified: Secondary | ICD-10-CM

## 2023-11-05 ENCOUNTER — Other Ambulatory Visit: Payer: Self-pay | Admitting: Cardiovascular Disease

## 2023-11-05 DIAGNOSIS — I480 Paroxysmal atrial fibrillation: Secondary | ICD-10-CM

## 2023-11-07 NOTE — Telephone Encounter (Signed)
 Requested Prescriptions  Pending Prescriptions Disp Refills   traZODone  (DESYREL ) 100 MG tablet [Pharmacy Med Name: TRAZODONE  100MG  TABLETS] 90 tablet 0    Sig: TAKE 1 TABLET(100 MG) BY MOUTH AT BEDTIME     Psychiatry: Antidepressants - Serotonin Modulator Passed - 11/07/2023 10:50 AM      Passed - Valid encounter within last 6 months    Recent Outpatient Visits           3 months ago Uncontrolled type 2 diabetes mellitus with hyperglycemia [facility])   [facility] [place], [place]

## 2023-11-07 NOTE — Telephone Encounter (Signed)
 Eliquis  5mg  refill request received. Patient is 68 years old, weight-119.7kg, Crea-1.16 on 07/21/23, Diagnosis-Afib, and last seen by Damien Braver on 02/24/23 via Tele Visit. Dose is appropriate based on dosing criteria. Will send in refill to requested pharmacy.

## 2023-11-10 ENCOUNTER — Other Ambulatory Visit: Payer: Self-pay | Admitting: Internal Medicine

## 2023-11-10 DIAGNOSIS — I10 Essential (primary) hypertension: Secondary | ICD-10-CM

## 2023-11-11 NOTE — Telephone Encounter (Signed)
 Requested Prescriptions  Pending Prescriptions Disp Refills   amLODipine  (NORVASC ) 5 MG tablet [Pharmacy Med Name: AMLODIPINE  BESYLATE 5MG  TABLETS] 90 tablet 0    Sig: TAKE 1 TABLET(5 MG) BY MOUTH DAILY     Cardiovascular: Calcium  Channel Blockers 2 Passed - 11/11/2023 12:00 PM      Passed - Last BP in normal range    BP Readings from Last 1 Encounters:  07/21/23 126/72         Passed - Last Heart Rate in normal range    Pulse Readings from Last 1 Encounters:  07/21/23 82         Passed - Valid encounter within last 6 months    Recent Outpatient Visits           3 months ago Uncontrolled type 2 diabetes mellitus with hyperglycemia [facility])   [facility] [place], [place]

## 2023-11-13 ENCOUNTER — Other Ambulatory Visit: Payer: Self-pay | Admitting: Internal Medicine

## 2023-11-13 DIAGNOSIS — E1165 Type 2 diabetes mellitus with hyperglycemia: Secondary | ICD-10-CM

## 2023-11-14 ENCOUNTER — Telehealth: Payer: Self-pay | Admitting: Internal Medicine

## 2023-11-14 NOTE — Telephone Encounter (Signed)
 Patient is calling to request a referral to ortho to follow up on his rotator cuff. Patient reporting that he has discussed this with Dr. Bernardo.

## 2023-11-15 ENCOUNTER — Other Ambulatory Visit: Payer: Self-pay | Admitting: Internal Medicine

## 2023-11-15 DIAGNOSIS — G8929 Other chronic pain: Secondary | ICD-10-CM

## 2023-11-15 NOTE — Telephone Encounter (Signed)
 Requested medications are due for refill today.  yes  Requested medications are on the active medications list.  yes  Last refill. 07/22/2023 6mL 0 rf  Future visit scheduled.   yes  Notes to clinic.  Medication not assigned to a protocol.     Requested Prescriptions  Pending Prescriptions Disp Refills   MOUNJARO  10 MG/0.5ML Pen [Pharmacy Med Name: MOUNJARO  10MG /0.5ML INJ (4 PENS)] 6 mL 0    Sig: ADMINISTER 10 MG UNDER THE SKIN 1 TIME A WEEK     Off-Protocol Failed - 11/15/2023  2:10 PM      Failed - Medication not assigned to a protocol, review manually.      Passed - Valid encounter within last 12 months    Recent Outpatient Visits           3 months ago Uncontrolled type 2 diabetes mellitus with hyperglycemia [facility])   [facility] [place], [place]

## 2023-11-20 ENCOUNTER — Other Ambulatory Visit: Payer: Self-pay | Admitting: Cardiovascular Disease

## 2023-11-21 NOTE — Telephone Encounter (Signed)
 [place] authorized and sent orthopedics referral.

## 2023-11-22 ENCOUNTER — Ambulatory Visit: Admitting: Internal Medicine

## 2023-11-22 ENCOUNTER — Other Ambulatory Visit: Payer: Self-pay

## 2023-11-22 ENCOUNTER — Encounter: Payer: Self-pay | Admitting: Internal Medicine

## 2023-11-22 VITALS — BP 124/72 | HR 82 | Temp 98.1°F | Resp 16 | Ht 73.5 in | Wt 255.6 lb

## 2023-11-22 DIAGNOSIS — E1165 Type 2 diabetes mellitus with hyperglycemia: Secondary | ICD-10-CM

## 2023-11-22 DIAGNOSIS — Z7985 Long-term (current) use of injectable non-insulin antidiabetic drugs: Secondary | ICD-10-CM | POA: Diagnosis not present

## 2023-11-22 LAB — POCT GLYCOSYLATED HEMOGLOBIN (HGB A1C): Hemoglobin A1C: 10 % — AB (ref 4.0–5.6)

## 2023-11-22 MED ORDER — INSULIN GLARGINE 100 UNITS/ML SOLOSTAR PEN: 10.0000 [IU] | PEN_INJECTOR | Freq: Every day | SUBCUTANEOUS | 1 refills | Status: DC

## 2023-11-22 MED ORDER — FREESTYLE LIBRE 3 SENSOR MISC: 1.0000 | 2 refills | Status: DC

## 2023-11-22 MED ORDER — INSULIN PEN NEEDLE 31G X 5 MM MISC: 1.0000 | Freq: Every day | 1 refills | Status: DC

## 2023-11-22 NOTE — Patient Instructions (Addendum)
 It was great seeing you today!  Plan discussed at today's visit: -Start Lantus  (Glargine) insulin  10 units once daily in the morning in addition to other diabetes medications -Use continuous glucose monitor to monitor fasting sugars   Follow up in: 1 month  Take care and let us  know if you have any questions or concerns prior to your next visit.  Dr. Bernardo     Insulin  Glargine Injection (Prefilled Pen) What is this medication? INSULIN  GLARGINE (IN su lin GLAR geen) treats diabetes. It works by increasing insulin  levels in your body, which decreases your blood sugar (glucose). It belongs to a group of medications called long-acting or basal insulins. Changes to diet and exercise are often combined with this medication. This medicine may be used for other purposes; ask your health care provider or pharmacist if you have questions. COMMON BRAND NAME(S): BASAGLAR , Basaglar  KwikPen, Basaglar  Tempo, Lantus , Lantus  SoloStar, REZVOGLAR , Semglee , Toujeo  Max SoloStar, Toujeo  SoloStar What should I tell my care team before I take this medication? They need to know if you have any of these conditions: Kidney disease Liver disease Often have low blood sugar Vision problems An unusual or allergic reaction to insulin , other medications, foods, dyes, or preservatives Pregnant or trying to get pregnant Breastfeeding How should I use this medication? This medication is injected under the skin. Use it as directed. It is important to follow the directions given to you by your care team. Talk to your care team about how to adjust doses for activity and illness. Know what to do if you skip a meal. Keep taking insulin  therapy unless your care team tells you to stop. This medication comes with INSTRUCTIONS FOR USE. Ask your pharmacist for directions on how to use this medication. Read the information carefully. Talk to your pharmacist or care team if you have questions. Check the label. Make sure you have  the right insulin  and supplies. Always check the appearance of your insulin  before using it. This insulin  should be clear and colorless like water. Do not use if it is thick, cloudy, or colored. Choose where to give the injection. This medication can be injected in the stomach, upper arms, or upper legs (thighs). Do not inject into skin that is thickened or has pits or lumps. Do not inject into skin that is irritated. Keep a chart of the areas you have injected. It is important to rotate your injection site. Take off the outer needle cover before using the dose. It is important that you put your used needles and syringes in a special sharps container. Do not put them in a trash can. If you do not have a sharps container, call your pharmacist or care team to get one. Talk to your care team about the use of this medication in children. While it may be prescribed for children as young as 6 years for selected conditions, precautions do apply. Overdosage: If you think you have taken too much of this medicine contact a poison control center or emergency room at once. NOTE: This medicine is only for you. Do not share this medicine with others. What if I miss a dose? It is important to follow your insulin  routine as directed. Talk to your care team if you miss a dose. They can help you adjust your dosing routine. Do not take double doses. What may interact with this medication? Some medications may affect your blood sugar levels or hide the symptoms of low blood sugar (hypoglycemia). Talk with your care  team about all of the medications you take. They may suggest changes to your insulin  dose or checking your blood sugar levels more often. Medications that may affect your blood sugar levels include: Alcohol Certain antibiotics, such as ciprofloxacin , levofloxacin, sulfamethoxazole ; trimethoprim  Certain medications for blood pressure or heart disease, such as benazepril, enalapril, lisinopril, losartan ,  valsartan Certain medications for mental health conditions, such as fluoxetine or olanzapine Diuretics, such as hydrochlorothiazide (HCTZ) Estrogen and progestin hormones Other medications for diabetes Steroid medications, such as prednisone  or cortisone Testosterone Thyroid  hormones Medications that may mask symptoms of low blood sugar include: Beta blockers, such as atenolol, metoprolol , propranolol Clonidine Guanethidine Reserpine This list may not describe all possible interactions. Give your health care provider a list of all the medicines, herbs, non-prescription drugs, or dietary supplements you use. Also tell them if you smoke, drink alcohol, or use illegal drugs. Some items may interact with your medicine. What should I watch for while using this medication? Visit your care team for regular health checks. You may need blood work done while you are taking this medication. Your care team will monitor your HbA1C (A1C). This test shows what your average blood sugar level was over the past 2 to 3 months. This medication may affect your coordination, reaction time, or judgment. Do not drive or operate machinery until you know how this medication affects you. Sit up or stand slowly to reduce the risk of dizzy or fainting spells. Drinking alcohol with this medication can increase the risk of these side effects. Learn how to check your blood sugar. Know the symptoms of low and high blood sugar and how to manage them. Know the symptoms of low blood sugar and know how to treat it. Always carry a source of quick sugar with you. Examples include hard sugar candy or glucose tablets. Make sure others know that you can choke if you eat or drink if your blood sugar is too low and you are unable to care for yourself. Get medical help at once. Tell your care team if you have high blood sugar. Your medication dose may change if your body is under stress. Some types of stress that may affect your blood sugar  include fever, infection, and surgery. Do not skip meals. Ask your care team if you should avoid alcohol. Many cough and cold products contain sugar or alcohol. These can affect blood sugar levels. Do not change the brand or type of insulin  unless your care team tells you to. Switching insulin  brand or type can affect your blood sugar enough to cause serious adverse effects. Always keep an extra supply of insulin  and related supplies on hand. Get rid of syringes and needles in a closed container to prevent accidental needle sticks. Do not share insulin  pens or cartridges with anyone, even if the needle is changed. Each pen should only be used by one person. Sharing could cause an infection. Do not use a syringe to take insulin  out of an insulin  pen. Doing this may result in the wrong dose of insulin . Wear a medical ID bracelet or chain. Carry a card that describes your condition. List the medications and doses you take on the card. What side effects may I notice from receiving this medication? Side effects that you should report to your care team as soon as possible: Allergic reactions--skin rash, itching, hives, swelling of the face, lips, tongue, or throat Low blood sugar (hypoglycemia)--tremors or shaking, anxiety, sweating, cold or clammy skin, confusion, dizziness, rapid heartbeat  Low potassium level--muscle pain or cramps, unusual weakness or fatigue, fast or irregular heartbeat, constipation Side effects that usually do not require medical attention (report these to your care team if they continue or are bothersome): Lipodystrophy--hardening or scarring of tissue at injection site Pain, redness, or irritation at injection site Weight gain This list may not describe all possible side effects. Call your doctor for medical advice about side effects. You may report side effects to FDA at 1-800-FDA-1088. Where should I keep my medication? Keep out of the reach of children and pets. Storage and  expiration dates for different insulin  products may vary. Check the label for information on how to store your insulin . Talk to your care team if you have any questions. Do not freeze. Protect from direct light and heat. Do not use insulin  if it is exposed to temperatures above 37 degrees C (98.6 degrees F). Do not use insulin  if it has been frozen. Lantus , Basaglar , Rezvoglar , Semglee  Unopened (not in-use) pens: Store at room temperature up to 30 degrees C (86 degrees F) for up to 28 days or refrigerated until the expiration date. Opened (in-use) pens: Store at room temperature up to 30 degrees C (86 degrees F) for up to 28 days. Do not refrigerate. Toujeo  Unopened (not in-use) pens: Store at room temperature up to 30 degrees C (86 degrees F) for up to 56 days or refrigerated until the expiration date. Opened (in-use) pens: Store at room temperature up to 30 degrees C (86 degrees F) for up to 56 days. Do not refrigerate. To get rid of medications that are no longer needed or have expired: Take the medication to a medication take-back program. Check with your pharmacy or law enforcement to find a location. If you cannot return the medication, ask your pharmacist or care team how to get rid of this medication safely. NOTE: This sheet is a summary. It may not cover all possible information. If you have questions about this medicine, talk to your doctor, pharmacist, or health care provider.  2024 Elsevier/Gold Standard (2023-04-15 00:00:00)

## 2023-11-22 NOTE — Progress Notes (Signed)
 Established Patient Office Visit  Subjective:  Patient ID: Marvin Duncan., male    DOB: 06/17/1955  Age: 68 y.o. MRN: [id number]  CC:  Chief Complaint  Patient presents with   Medical Management of Chronic Issues    HPI Marvin DESHMUKH Sr. presents for follow up on chronic medical conditions.   Discussed the use of AI scribe software for clinical note transcription with the patient, who gave verbal consent to proceed.  History of Present Illness [name] is a 68 year old male with type 2 diabetes who presents with elevated A1c levels.  His A1c is currently 10.0, an increase from previous levels. He is on Mounjaro , which initially lowered his A1c, but despite dose increases, his A1c continues to rise. He administers Mounjaro  weekly on Saturdays. He has used Trulicity  and Jardiance  in the past but experienced side effects with Jardiance . He is concerned about medication costs, paying about $65 foMounjaro . He is open to using a continuous glucose monitor and has an Android smartphone compatible with the app.  He recalls being more active in the past, which correlated with better A1c levels, and wants to start exercising again but feels limited by his current physical condition. He is not consuming sweets and is eating minimal meals, such as a tomato sandwich.   Diabetes, Type 2: -Last A1c 3/25 10.2% -Medications: Glipizide  10 mg BID, Metformin  1000 mg XR twice daily and increased Mounjaro  to 10 mg at LOV.  -Failed Meds: Jardiance  due to UTI's. had been on Trulicity   4.5 mg weekly but not available -Patient is compliant with the above medications and reports no side effects. -Diet: working on just drinking water, stopped juice, not changed. Eating one meal a day currently.  -Eye exam: Due -Microalbumin: UTD -Foot exam: UTD -Statin: yes -PNA vaccine: Prevnar 13 and 23 in the past, politely declines Prevnar 20 -Denies symptoms of hypoglycemia, polydipsia, numbness  extremities, foot ulcers/trauma.   A.Fib: -Currently on Lopressor  25 mg, Eliquis  5 mg BID -Compliant with above medications and denies adverse effects, denies abnormal bleeding.  -Following with Cardiology, note reviewed 02/24/23.   Hypertension/OSA: -Medications: Amlodipine  5 mg, Losartan  100 mg, Lopressor  25 mg -Patient is compliant with above medications and reports no side effects. -Checking BP at home (average): not checking  -Denies any SOB, CP, vision changes, LE edema or symptoms of hypotension -Compliant with CPAP   HLD: -Medications: Crestor  20 mg -Patient is compliant with above medications and reports no side effects.  -Last lipid panel:   Lipid Panel     Component Value Date/Time   CHOL 119 07/21/2023 0943   TRIG 71 07/21/2023 0943   HDL 43 07/21/2023 0943   CHOLHDL 2.8 07/21/2023 0943   CHOLHDL 3.4 06/26/2018 0818   LDLCALC 61 07/21/2023 0943   LDLCALC 83 06/26/2018 0818   LABVLDL 15 07/21/2023 0943   GERD: -Currently on Pepcid OTC -Symptoms well controlled.    Seasonal Allergies/PND: -Using Flonase     Health Maintenance:  -Blood work UTD -Colon cancer screening: colonoscopy 10/24, repeat in 10 years   Past Medical History:  Diagnosis Date   Adrenal tumor 07/26/2018   Aortic atherosclerosis (HCC)    a. 07/2018 CT chest: Atherosclerotic calcifications of the thoracic aorta.   Chest pain    a. 05/2014 Ex MV: no evidence of significant ischemia, GI uptake was noted, EF 51%, no ECG changes concerning for ischemia, normal study; b. 05/2015 Ex MV: No ischemia/infarct. EF 38% (2/2  atten artifact-->EF 55-60% by echo 05/2015).   Decreased libido    Diastolic dysfunction    a. 05/2015 Echo: EF 55-60%, mild LVH. Mild MR. Mildly dil LA/RV; b. 02/2021 Echo: EF 55-60%, no rwma, mild-mod LVH, GrI DD, nl RV size/fxn. No signif valvular dzs.   HTN (hypertension)    Hyperlipidemia    IDDM (insulin  dependent diabetes mellitus)    Obesity    PAF (paroxysmal atrial  fibrillation) (HCC)    a. Dx 12/2020. CHA2DS2VASc = 2-3 (HTN/DM/Ao atherosclerosis); b. 02/2021 Zio: RSR, rare PACs/PVCs. No Afib or other significant arrhythmias.   Pharyngitis    Sleep apnea    uses CPAP    Past Surgical History:  Procedure Laterality Date   AV FISTULA REPAIR     CARDIAC CATHETERIZATION     [facility]    COLONOSCOPY  2013   COLONOSCOPY WITH PROPOFOL  N/A 12/19/2020   Procedure: COLONOSCOPY WITH PROPOFOL ;  Surgeon: Therisa Bi, MD;  Location: [facility] ENDOSCOPY;  Service: Gastroenterology;  Laterality: N/A;   COLONOSCOPY WITH PROPOFOL  N/A 03/15/2023   Procedure: COLONOSCOPY WITH PROPOFOL ;  Surgeon: Therisa Bi, MD;  Location: [facility] ENDOSCOPY;  Service: Gastroenterology;  Laterality: N/A;    Family History  Problem Relation Age of Onset   Breast cancer Mother    Cancer Mother    Kidney failure Father    Diabetes Father    Alcohol abuse Father    Diabetes Sister    Diabetes Sister    Other Daughter     Social History   Socioeconomic History   Marital status: Married    Spouse name: Glenda   Number of children: 3   Years of education: Not on file   Highest education level: Some college, no degree  Occupational History   Not on file  Tobacco Use   Smoking status: Former    Current packs/day: 0.00    Average packs/day: 0.3 packs/day for 25.0 years (6.3 ttl pk-yrs)    Types: Cigarettes    Start date: 05/17/1973    Quit date: 05/17/1998    Years since quitting: 25.5   Smokeless tobacco: Never   Tobacco comments:    off and on - maybe 5 cigarettes  Vaping Use   Vaping status: Never Used  Substance and Sexual Activity   Alcohol use: No    Alcohol/week: 0.0 standard drinks of alcohol    Comment: previously but not currently   Drug use: No   Sexual activity: Yes    Partners: Female  Other Topics Concern   Not on file  Social History Narrative   Not on file   Social Drivers of Health   Financial Resource Strain: Low Risk  (10/16/2020)   Overall Financial Resource  Strain (CARDIA)    Difficulty of Paying Living Expenses: Not hard at all  Food Insecurity: No Food Insecurity (10/16/2020)   Hunger Vital Sign    Worried About Running Out of Food in the Last Year: Never true    Ran Out of Food in the Last Year: Never true  Transportation Needs: No Transportation Needs (10/16/2020)   PRAPARE - Administrator, Civil Service (Medical): No    Lack of Transportation (Non-Medical): No  Physical Activity: Insufficiently Active (10/16/2020)   Exercise Vital Sign    Days of Exercise per Week: 2 days    Minutes of Exercise per Session: 30 min  Stress: No Stress Concern Present (10/16/2020)   [employer] of Occupational Health - Occupational Stress Questionnaire  Feeling of Stress : Only a little  Social Connections: Socially Integrated (10/16/2020)   Social Connection and Isolation Panel    Frequency of Communication with Friends and Family: Twice a week    Frequency of Social Gatherings with Friends and Family: Twice a week    Attends Religious Services: More than 4 times per year    Active Member of [employer] or Organizations: Yes    Attends Engineer, structural: More than 4 times per year    Marital Status: Married  Catering manager Violence: Not At Risk (10/16/2020)   Humiliation, Afraid, Rape, and Kick questionnaire    Fear of Current or Ex-Partner: No    Emotionally Abused: No    Physically Abused: No    Sexually Abused: No    Outpatient Medications Prior to Visit  Medication Sig Dispense Refill   amLODipine  (NORVASC ) 5 MG tablet TAKE 1 TABLET(5 MG) BY MOUTH DAILY 90 tablet 0   Cholecalciferol (VITAMIN D ) 2000 UNITS tablet Take 2,000 Units by mouth daily.     docusate sodium  (COLACE) 100 MG capsule Take 1 capsule (100 mg total) by mouth 2 (two) times daily. 60 capsule 2   ELIQUIS  5 MG TABS tablet TAKE 1 TABLET(5 MG) BY MOUTH TWICE DAILY 180 tablet 1   Flaxseed, Linseed, (FLAXSEED OIL) 1000 MG CAPS Take 1,000 mg by mouth daily.       glipiZIDE  (GLUCOTROL ) 10 MG tablet TAKE 1 TABLET BY MOUTH TWICE DAILY BEFORE A MEAL FOR HIGH SUGAR 180 tablet 1   losartan  (COZAAR ) 100 MG tablet TAKE 1 TABLET(100 MG) BY MOUTH DAILY 90 tablet 1   metFORMIN  (GLUCOPHAGE -XR) 500 MG 24 hr tablet TAKE 2 TABLETS(1000 MG) BY MOUTH TWICE DAILY WITH A MEAL 360 tablet 0   metoprolol  tartrate (LOPRESSOR ) 25 MG tablet TAKE 1 TABLET(25 MG) BY MOUTH TWICE DAILY 60 tablet 0   Omega-3 Fatty Acids (FISH OIL) 1000 MG CAPS Take 1,000 mg by mouth daily.     rosuvastatin  (CRESTOR ) 20 MG tablet TAKE 1 TABLET(20 MG) BY MOUTH AT BEDTIME 90 tablet 1   tirzepatide  (MOUNJARO ) 10 MG/0.5ML Pen ADMINISTER 10 MG UNDER THE SKIN 1 TIME A WEEK 2 mL 0   traZODone  (DESYREL ) 100 MG tablet TAKE 1 TABLET(100 MG) BY MOUTH AT BEDTIME 90 tablet 0   vitamin B-12 (CYANOCOBALAMIN) 1000 MCG tablet Take 1,000 mcg by mouth daily.     ACCU-CHEK GUIDE test strip USE UP TO FOUR TIMES DAILY AS DIRECTED (Patient not taking: Reported on 07/21/2023) 100 strip 1   Accu-Chek Softclix Lancets lancets USE TO TEST BLOOD SUGAR UP TO FOUR TIMES DAILY AS DIRECTED (Patient not taking: Reported on 07/21/2023) 100 each 1   blood glucose meter kit and supplies Dispense based on patient and insurance preference. Use up to four times daily as directed. (FOR ICD-10 E10.9, E11.9). (Patient not taking: Reported on 07/21/2023) 1 each 0   No facility-administered medications prior to visit.    Allergies  Allergen Reactions   Atorvastatin Other (See Comments)    Statins cause headaches and muscle aches    ROS Review of Systems  All other systems reviewed and are negative.     Objective:    Physical Exam Constitutional:      Appearance: Normal appearance.  HENT:     Head: Normocephalic and atraumatic.  Eyes:     Conjunctiva/sclera: Conjunctivae normal.  Cardiovascular:     Rate and Rhythm: Normal rate and regular rhythm.  Pulmonary:     Effort:  Pulmonary effort is normal.     Breath sounds: Normal breath  sounds.  Musculoskeletal:     Right lower leg: No edema.     Left lower leg: No edema.  Skin:    General: Skin is warm and dry.  Neurological:     General: No focal deficit present.     Mental Status: He is alert. Mental status is at baseline.  Psychiatric:        Mood and Affect: Mood normal.        Behavior: Behavior normal.     BP 124/72 (Cuff Size: Large)   Pulse 82   Temp 98.1 F (36.7 C) (Oral)   Resp 16   Ht 6' 1.5 (1.867 m)   Wt 255 lb 9.6 oz (115.9 kg)   SpO2 97%   BMI 33.27 kg/m  Wt Readings from Last 3 Encounters:  11/22/23 255 lb 9.6 oz (115.9 kg)  07/21/23 263 lb 12.8 oz (119.7 kg)  04/21/23 269 lb 9.6 oz (122.3 kg)     Health Maintenance Due  Topic Date Due   Zoster Vaccines- Shingrix (1 of 2) Never done   COVID-19 Vaccine (1 - 2024-25 season) Never done   OPHTHALMOLOGY EXAM  11/16/2023     There are no preventive care reminders to display for this patient.  Lab Results  Component Value Date   TSH 1.920 01/05/2021   Lab Results  Component Value Date   WBC 6.3 07/21/2023   HGB 13.8 07/21/2023   HCT 42.6 07/21/2023   MCV 86 07/21/2023   PLT 266 07/21/2023   Lab Results  Component Value Date   NA 137 07/21/2023   K 4.8 07/21/2023   CO2 21 07/21/2023   GLUCOSE 199 (H) 07/21/2023   BUN 18 07/21/2023   CREATININE 1.16 07/21/2023   BILITOT 0.5 07/21/2023   ALKPHOS 90 07/21/2023   AST 25 07/21/2023   ALT 17 07/21/2023   PROT 7.4 07/21/2023   ALBUMIN 4.4 07/21/2023   CALCIUM  9.8 07/21/2023   ANIONGAP 11 01/08/2023   EGFR 69 07/21/2023   Lab Results  Component Value Date   CHOL 119 07/21/2023   Lab Results  Component Value Date   HDL 43 07/21/2023   Lab Results  Component Value Date   LDLCALC 61 07/21/2023   Lab Results  Component Value Date   TRIG 71 07/21/2023   Lab Results  Component Value Date   CHOLHDL 2.8 07/21/2023   Lab Results  Component Value Date   HGBA1C 10.0 (A) 11/22/2023      Assessment & Plan:    Assessment & Plan Type 2 Diabetes Mellitus with poor glycemic control A1c elevated at 10.0, indicating poor control. Despite Mounjaro  and lifestyle changes, A1c rising, suggesting pancreatic burnout. Discussed Ozempic but insulin  may be more effective. Addressed insulin  cost concerns, noting it might be cheaper than Mounjaro . Emphasized importance of addressing high A1c to prevent complications. Discussed Lantus  for quicker control and proposed continuous glucose monitor. - Prescribe Lantus  10 units daily. - Administer insulin  in the morning. - Provide Freestyle Libre 3 Plus for monitoring. - Continue Metformin  and Mounjaro . - Educate on monitoring blood sugar and hypoglycemia signs. - Follow-up in one month to assess insulin  therapy.  General Health Maintenance Acknowledged need to increase physical activity for glycemic control and overall health improvement. - Encourage regular physical activity like cycling or walking.  - POCT HgB A1C - Continuous Glucose Sensor (FREESTYLE LIBRE 3 SENSOR) MISC; Inject 1 each into the  skin every 14 (fourteen) days. Place 1 sensor on the skin every 14 days. Use to check glucose continuously  Dispense: 2 each; Refill: 2 - insulin  glargine (LANTUS ) 100 unit/mL SOPN; Inject 10 Units into the skin daily. Total daily dose = 0.2-0.5 units/kg/day---> 50% Basal/50% Bolus  Dispense: 3 mL; Refill: 1 - Insulin  Pen Needle 31G X 5 MM MISC; 1 each by Does not apply route daily.  Dispense: 100 each; Refill: 1   Follow-up: Return in about 4 weeks (around 12/20/2023).    Sharyle Fischer, DO

## 2023-12-01 ENCOUNTER — Other Ambulatory Visit: Payer: Self-pay | Admitting: Internal Medicine

## 2023-12-01 DIAGNOSIS — E1165 Type 2 diabetes mellitus with hyperglycemia: Secondary | ICD-10-CM

## 2023-12-02 NOTE — Telephone Encounter (Signed)
 Rx 08/24/23 #180 1RF- too soon Requested Prescriptions  Pending Prescriptions Disp Refills   glipiZIDE  (GLUCOTROL ) 10 MG tablet [Pharmacy Med Name: GLIPIZIDE  10MG  TABLETS] 60 tablet     Sig: TAKE 1 TABLET BY MOUTH TWICE DAILY BEFORE A MEAL FOR HIGH SUGAR     Endocrinology:  Diabetes - Sulfonylureas Failed - 12/02/2023  2:27 PM      Failed - HBA1C is between 0 and 7.9 and within 180 days    Hemoglobin A1C  Date Value Ref Range Status  11/22/2023 10.0 (A) 4.0 - 5.6 % Final   HbA1c, POC (controlled diabetic range)  Date Value Ref Range Status  03/01/2019 6.5 0.0 - 7.0 % Final   Hgb A1c MFr Bld  Date Value Ref Range Status  07/21/2023 10.2 (H) 4.8 - 5.6 % Final    Comment:             Prediabetes: 5.7 - 6.4          Diabetes: >6.4          Glycemic control for adults with diabetes: <7.0          Passed - Cr in normal range and within 360 days    Creat  Date Value Ref Range Status  06/26/2018 1.15 0.70 - 1.25 mg/dL Final    Comment:    For patients >35 years of age, the reference limit for Creatinine is approximately 13% higher for people identified as African-American. .    Creatinine, Ser  Date Value Ref Range Status  07/21/2023 1.16 0.76 - 1.27 mg/dL Final   Creatinine, Urine  Date Value Ref Range Status  12/26/2015 113 20 - 370 mg/dL Final         Passed - Valid encounter within last 6 months    Recent Outpatient Visits           1 week ago Uncontrolled type 2 diabetes mellitus with hyperglycemia [facility])   [place] [facility] Bernardo Fend, DO   4 months ago Uncontrolled type 2 diabetes mellitus with hyperglycemia [facility])   [facility] Bernardo Fend, DO       Future Appointments             In 2 weeks Bernardo Fend, DO  [facility], [facility]

## 2023-12-06 ENCOUNTER — Other Ambulatory Visit: Payer: Self-pay | Admitting: Internal Medicine

## 2023-12-06 DIAGNOSIS — I10 Essential (primary) hypertension: Secondary | ICD-10-CM

## 2023-12-06 DIAGNOSIS — E1165 Type 2 diabetes mellitus with hyperglycemia: Secondary | ICD-10-CM

## 2023-12-08 ENCOUNTER — Telehealth: Payer: Self-pay | Admitting: Internal Medicine

## 2023-12-08 ENCOUNTER — Other Ambulatory Visit: Payer: Self-pay | Admitting: Internal Medicine

## 2023-12-08 DIAGNOSIS — E1165 Type 2 diabetes mellitus with hyperglycemia: Secondary | ICD-10-CM

## 2023-12-08 DIAGNOSIS — G4700 Insomnia, unspecified: Secondary | ICD-10-CM

## 2023-12-08 MED ORDER — METFORMIN HCL ER 500 MG PO TB24: 500.0000 mg | ORAL_TABLET | Freq: Every day | ORAL | 0 refills | Status: DC

## 2023-12-08 NOTE — Telephone Encounter (Signed)
 Refill sent.

## 2023-12-08 NOTE — Telephone Encounter (Signed)
 Discontinued Side effect (s) Bernardo Fend, DO 09/27/22 0912         Requested Prescriptions  Refused Prescriptions Disp Refills   metFORMIN  (GLUCOPHAGE ) 1000 MG tablet [Pharmacy Med Name: METFORMIN  1000MG  TABLETS] 180 tablet 1    Sig: TAKE 1 TABLET(1000 MG) BY MOUTH TWICE DAILY WITH A MEAL     There is no refill protocol information for this order

## 2023-12-08 NOTE — Telephone Encounter (Signed)
 Requested Prescriptions  Pending Prescriptions Disp Refills   losartan  (COZAAR ) 100 MG tablet [Pharmacy Med Name: LOSARTAN  100MG  TABLETS] 90 tablet 0    Sig: TAKE 1 TABLET(100 MG) BY MOUTH DAILY     Cardiovascular:  Angiotensin Receptor Blockers Passed - 12/08/2023 10:23 AM      Passed - Cr in normal range and within 180 days    Creat  Date Value Ref Range Status  06/26/2018 1.15 0.70 - 1.25 mg/dL Final    Comment:    For patients >67 years of age, the reference limit for Creatinine is approximately 13% higher for people identified as African-American. .    Creatinine, Ser  Date Value Ref Range Status  07/21/2023 1.16 0.76 - 1.27 mg/dL Final   Creatinine, Urine  Date Value Ref Range Status  12/26/2015 113 20 - 370 mg/dL Final         Passed - K in normal range and within 180 days    Potassium  Date Value Ref Range Status  07/21/2023 4.8 3.5 - 5.2 mmol/L Final  04/22/2014 3.5 3.5 - 5.1 mmol/L Final         Passed - Patient is not pregnant      Passed - Last BP in normal range    BP Readings from Last 1 Encounters:  11/22/23 124/72         Passed - Valid encounter within last 6 months    Recent Outpatient Visits           2 weeks ago Uncontrolled type 2 diabetes mellitus with hyperglycemia [facility])   [place] [facility] Bernardo Fend, DO   4 months ago Uncontrolled type 2 diabetes mellitus with hyperglycemia [facility])   [facility] Bernardo Fend, DO       Future Appointments             In 1 week Bernardo Fend, DO [facility], [facility]

## 2023-12-08 NOTE — Telephone Encounter (Signed)
 Second Request   metFORMIN  (GLUCOPHAGE -XR) 500 MG 24 hr tablet

## 2023-12-10 ENCOUNTER — Other Ambulatory Visit: Payer: Self-pay | Admitting: Internal Medicine

## 2023-12-10 DIAGNOSIS — E1165 Type 2 diabetes mellitus with hyperglycemia: Secondary | ICD-10-CM

## 2023-12-12 ENCOUNTER — Other Ambulatory Visit: Payer: Self-pay | Admitting: Internal Medicine

## 2023-12-12 DIAGNOSIS — I700 Atherosclerosis of aorta: Secondary | ICD-10-CM

## 2023-12-12 DIAGNOSIS — E782 Mixed hyperlipidemia: Secondary | ICD-10-CM

## 2023-12-12 NOTE — Telephone Encounter (Signed)
 Requested medication (s) are due for refill today:   Yes  Requested medication (s) are on the active medication list:   Yes  Future visit scheduled:   Yes 12/20/2023    Last ordered: 11/15/2023 2 ml, 0 refills  No protocol assigned to this medication    Requested Prescriptions  Pending Prescriptions Disp Refills   MOUNJARO  10 MG/0.5ML Pen [Pharmacy Med Name: MOUNJARO  10MG /0.5ML INJ (4 PENS)] 2 mL 0    Sig: ADMINISTER 10 MG UNDER THE SKIN 1 TIME A WEEK     Off-Protocol Failed - 12/12/2023  2:21 PM      Failed - Medication not assigned to a protocol, review manually.      Passed - Valid encounter within last 12 months    Recent Outpatient Visits           2 weeks ago Uncontrolled type 2 diabetes mellitus with hyperglycemia [facility])   [place] [facility] Bernardo Fend, DO   4 months ago Uncontrolled type 2 diabetes mellitus with hyperglycemia [facility])   [place] [facility] Bernardo Fend, DO       Future Appointments             In 1 week Bernardo Fend, DO [facility], [facility]

## 2023-12-13 NOTE — Telephone Encounter (Signed)
 Requested Prescriptions  Pending Prescriptions Disp Refills   rosuvastatin  (CRESTOR ) 20 MG tablet [Pharmacy Med Name: ROSUVASTATIN  20MG  TABLETS] 90 tablet 0    Sig: TAKE 1 TABLET(20 MG) BY MOUTH AT BEDTIME     Cardiovascular:  Antilipid - Statins 2 Failed - 12/13/2023  2:01 PM      Failed - Lipid Panel in normal range within the last 12 months    Cholesterol, Total  Date Value Ref Range Status  07/21/2023 119 100 - 199 mg/dL Final   LDL Cholesterol (Calc)  Date Value Ref Range Status  06/26/2018 83 mg/dL (calc) Final    Comment:    Reference range: <100 . Desirable range <100 mg/dL for primary prevention;   <70 mg/dL for patients with CHD or diabetic patients  with > or = 2 CHD risk factors. SABRA LDL-C is now calculated using the Martin-Hopkins  calculation, which is a validated novel method providing  better accuracy than the Friedewald equation in the  estimation of LDL-C.  Gladis APPLETHWAITE et al. SANDREA. 7986;689(80): 2061-2068  (http://education.[employer].com/faq/FAQ164)    LDL Chol Calc (NIH)  Date Value Ref Range Status  07/21/2023 61 0 - 99 mg/dL Final   HDL  Date Value Ref Range Status  07/21/2023 43 >39 mg/dL Final   Triglycerides  Date Value Ref Range Status  07/21/2023 71 0 - 149 mg/dL Final         Passed - Cr in normal range and within 360 days    Creat  Date Value Ref Range Status  06/26/2018 1.15 0.70 - 1.25 mg/dL Final    Comment:    For patients >37 years of age, the reference limit for Creatinine is approximately 13% higher for people identified as African-American. .    Creatinine, Ser  Date Value Ref Range Status  07/21/2023 1.16 0.76 - 1.27 mg/dL Final   Creatinine, Urine  Date Value Ref Range Status  12/26/2015 113 20 - 370 mg/dL Final         Passed - Patient is not pregnant      Passed - Valid encounter within last 12 months    Recent Outpatient Visits           3 weeks ago Uncontrolled type 2 diabetes mellitus with hyperglycemia  [facility])   [place] [facility] Bernardo Fend, DO   4 months ago Uncontrolled type 2 diabetes mellitus with hyperglycemia [facility])   [facility] Bernardo Fend, DO       Future Appointments             In 1 week Bernardo Fend, DO [facility], [facility]

## 2023-12-20 ENCOUNTER — Other Ambulatory Visit: Payer: Self-pay

## 2023-12-20 ENCOUNTER — Ambulatory Visit: Admitting: Internal Medicine

## 2023-12-20 ENCOUNTER — Encounter: Payer: Self-pay | Admitting: Internal Medicine

## 2023-12-20 VITALS — BP 132/84 | HR 81 | Temp 97.8°F | Resp 16 | Ht 73.5 in | Wt 259.0 lb

## 2023-12-20 DIAGNOSIS — E1165 Type 2 diabetes mellitus with hyperglycemia: Secondary | ICD-10-CM | POA: Diagnosis not present

## 2023-12-20 DIAGNOSIS — I10 Essential (primary) hypertension: Secondary | ICD-10-CM

## 2023-12-20 DIAGNOSIS — Z7984 Long term (current) use of oral hypoglycemic drugs: Secondary | ICD-10-CM | POA: Diagnosis not present

## 2023-12-20 MED ORDER — METOPROLOL TARTRATE 25 MG PO TABS: ORAL_TABLET | ORAL | 1 refills | Status: DC

## 2023-12-20 MED ORDER — FREESTYLE LIBRE 3 PLUS SENSOR MISC: 2 refills | Status: DC

## 2023-12-20 MED ORDER — MOUNJARO 10 MG/0.5ML ~~LOC~~ SOAJ: 10.0000 mg | SUBCUTANEOUS | 0 refills | Status: DC

## 2023-12-20 MED ORDER — GLIPIZIDE 10 MG PO TABS: ORAL_TABLET | ORAL | 1 refills | Status: DC

## 2023-12-20 MED ORDER — AMLODIPINE BESYLATE 5 MG PO TABS: 5.0000 mg | ORAL_TABLET | Freq: Every day | ORAL | 1 refills | Status: DC

## 2023-12-20 NOTE — Progress Notes (Signed)
 Established Patient Office Visit  Subjective:  Patient ID: Marvin Duncan., male    DOB: [birth year]  Age: 68 y.o. MRN: [id number]  CC:  Chief Complaint  Patient presents with   Follow-up    4 week recheck    HPI BASHIR MARCHETTI Sr. presents for follow up on chronic medical conditions.   Discussed the use of AI scribe software for clinical note transcription with the patient, who gave verbal consent to proceed.  History of Present Illness  Sr. Marvin Capell. is a 68 year old male with diabetes who presents for follow-up of blood sugar management.  He experiences difficulty maintaining stable blood sugar levels, with fluctuations between 200 to 250 mg/dL, especially postprandially. Previously, his blood sugar was better controlled with glipizide , metformin , and Mounjaro , achieving morning readings around 116 mg/dL. He ran out of glipizide  and was unable to get a refill authorized, contributing to the current elevated levels.  He is currently on Mounjaro  and metformin , and has been started on insulin  at a dose of 10 units. He wants to further lower his blood sugar levels. He mentions a recent issue with his pharmacy not refilling his glipizide  prescription despite having refills available. Additionally, he discusses transitioning from [facility] 3 to [facility] 3 Plus for continuous glucose monitoring.  His current medications include Mounjaro , metformin , and insulin . He also takes amlodipine  and metoprolol  for blood pressure management.   Diabetes, Type 2: -Last A1c 7/25 10.0% -Medications: Lantus  10 units started at [facility]OV. Also on Glipizide  10 mg BID (but ran out a few weeks ago), Metformin  1000 mg XR twice daily and Mounjaro  10 mg weekly.   -Failed Meds: Jardiance  due to UTI's. had been on Trulicity   4.5 mg weekly but not available -Patient is compliant with the above medications and reports no side effects. -Diet: working on just drinking water, stopped juice, not changed. Eating  one meal a day currently.  -Blood sugars: Has Free Style Libre now, average without Glipizide  200-250, with it was around 120 -Eye exam: Due -Microalbumin: UTD -Foot exam: UTD -Statin: yes -PNA vaccine: Prevnar 13 and 23 in the past, politely declines Prevnar 20 -Denies symptoms of hypoglycemia, polydipsia, numbness extremities, foot ulcers/trauma.   Past Medical History:  Diagnosis Date   Adrenal tumor 07/26/2018   Aortic atherosclerosis (HCC)    a. 07/2018 CT chest: Atherosclerotic calcifications of the thoracic aorta.   Chest pain    a. 05/2014 Ex MV: no evidence of significant ischemia, GI uptake was noted, EF 51%, no ECG changes concerning for ischemia, normal study; b. 05/2015 Ex MV: No ischemia/infarct. EF 38% (2/2 atten artifact-->EF 55-60% by echo 05/2015).   Decreased libido    Diastolic dysfunction    a. 05/2015 Echo: EF 55-60%, mild LVH. Mild MR. Mildly dil LA/RV; b. 02/2021 Echo: EF 55-60%, no rwma, mild-mod LVH, GrI DD, nl RV size/fxn. No signif valvular dzs.   HTN (hypertension)    Hyperlipidemia    IDDM (insulin  dependent diabetes mellitus)    Obesity    PAF (paroxysmal atrial fibrillation) (HCC)    a. Dx 12/2020. CHA2DS2VASc = 2-3 (HTN/DM/Ao atherosclerosis); b. 02/2021 Zio: RSR, rare PACs/PVCs. No Afib or other significant arrhythmias.   Pharyngitis    Sleep apnea    uses CPAP    Past Surgical History:  Procedure Laterality Date   AV FISTULA REPAIR     CARDIAC CATHETERIZATION     [facility]    COLONOSCOPY  2013   COLONOSCOPY WITH  PROPOFOL  N/A 12/19/2020   Procedure: COLONOSCOPY WITH PROPOFOL ;  Surgeon: Therisa Bi, MD;  Location: [facility] ENDOSCOPY;  Service: Gastroenterology;  Laterality: N/A;   COLONOSCOPY WITH PROPOFOL  N/A 03/15/2023   Procedure: COLONOSCOPY WITH PROPOFOL ;  Surgeon: Therisa Bi, MD;  Location: [facility] ENDOSCOPY;  Service: Gastroenterology;  Laterality: N/A;    Family History  Problem Relation Age of Onset   Breast cancer Mother    Cancer Mother    Kidney  failure Father    Diabetes Father    Alcohol abuse Father    Diabetes Sister    Diabetes Sister    Other Daughter     Social History   Socioeconomic History   Marital status: Married    Spouse name: Glenda   Number of children: 3   Years of education: Not on file   Highest education level: Some college, no degree  Occupational History   Not on file  Tobacco Use   Smoking status: Former    Current packs/day: 0.00    Average packs/day: 0.3 packs/day for 25.0 years (6.3 ttl pk-yrs)    Types: Cigarettes    Start date: 05/17/1973    Quit date: 05/17/1998    Years since quitting: 25.6   Smokeless tobacco: Never   Tobacco comments:    off and on - maybe 5 cigarettes  Vaping Use   Vaping status: Never Used  Substance and Sexual Activity   Alcohol use: No    Alcohol/week: 0.0 standard drinks of alcohol    Comment: previously but not currently   Drug use: No   Sexual activity: Yes    Partners: Female  Other Topics Concern   Not on file  Social History Narrative   Not on file   Social Drivers of Health   Financial Resource Strain: Low Risk  (10/16/2020)   Overall Financial Resource Strain (CARDIA)    Difficulty of Paying Living Expenses: Not hard at all  Food Insecurity: No Food Insecurity (10/16/2020)   Hunger Vital Sign    Worried About Running Out of Food in the Last Year: Never true    Ran Out of Food in the Last Year: Never true  Transportation Needs: No Transportation Needs (10/16/2020)   PRAPARE - Administrator, Civil Service (Medical): No    Lack of Transportation (Non-Medical): No  Physical Activity: Insufficiently Active (10/16/2020)   Exercise Vital Sign    Days of Exercise per Week: 2 days    Minutes of Exercise per Session: 30 min  Stress: No Stress Concern Present (10/16/2020)   [employer] of Occupational Health - Occupational Stress Questionnaire    Feeling of Stress : Only a little  Social Connections: Socially Integrated (10/16/2020)   Social  Connection and Isolation Panel    Frequency of Communication with Friends and Family: Twice a week    Frequency of Social Gatherings with Friends and Family: Twice a week    Attends Religious Services: More than 4 times per year    Active Member of [employer] or Organizations: Yes    Attends Engineer, structural: More than 4 times per year    Marital Status: Married  Catering manager Violence: Not At Risk (10/16/2020)   Humiliation, Afraid, Rape, and Kick questionnaire    Fear of Current or Ex-Partner: No    Emotionally Abused: No    Physically Abused: No    Sexually Abused: No    Outpatient Medications Prior to Visit  Medication Sig Dispense Refill   amLODipine  (  NORVASC ) 5 MG tablet TAKE 1 TABLET(5 MG) BY MOUTH DAILY 90 tablet 0   Cholecalciferol (VITAMIN D ) 2000 UNITS tablet Take 2,000 Units by mouth daily.     Continuous Glucose Sensor (FREESTYLE LIBRE 3 SENSOR) MISC Inject 1 each into the skin every 14 (fourteen) days. Place 1 sensor on the skin every 14 days. Use to check glucose continuously 2 each 2   docusate sodium  (COLACE) 100 MG capsule Take 1 capsule (100 mg total) by mouth 2 (two) times daily. 60 capsule 2   ELIQUIS  5 MG TABS tablet TAKE 1 TABLET(5 MG) BY MOUTH TWICE DAILY 180 tablet 1   Flaxseed, Linseed, (FLAXSEED OIL) 1000 MG CAPS Take 1,000 mg by mouth daily.      glipiZIDE  (GLUCOTROL ) 10 MG tablet TAKE 1 TABLET BY MOUTH TWICE DAILY BEFORE A MEAL FOR HIGH SUGAR 180 tablet 1   insulin  glargine (LANTUS ) 100 unit/mL SOPN Inject 10 Units into the skin daily. Total daily dose = 0.2-0.5 units/kg/day---> 50% Basal/50% Bolus 3 mL 1   Insulin  Pen Needle 31G X 5 MM MISC 1 each by Does not apply route daily. 100 each 1   losartan  (COZAAR ) 100 MG tablet TAKE 1 TABLET(100 MG) BY MOUTH DAILY 90 tablet 0   metFORMIN  (GLUCOPHAGE -XR) 500 MG 24 hr tablet Take 1 tablet (500 mg total) by mouth daily with breakfast. 360 tablet 0   metoprolol  tartrate (LOPRESSOR ) 25 MG tablet TAKE 1  TABLET(25 MG) BY MOUTH TWICE DAILY 60 tablet 0   MOUNJARO  10 MG/0.5ML Pen ADMINISTER 10 MG UNDER THE SKIN 1 TIME A WEEK 2 mL 0   Omega-3 Fatty Acids (FISH OIL) 1000 MG CAPS Take 1,000 mg by mouth daily.     rosuvastatin  (CRESTOR ) 20 MG tablet TAKE 1 TABLET(20 MG) BY MOUTH AT BEDTIME 90 tablet 0   traZODone  (DESYREL ) 100 MG tablet TAKE 1 TABLET(100 MG) BY MOUTH AT BEDTIME 90 tablet 0   vitamin B-12 (CYANOCOBALAMIN) 1000 MCG tablet Take 1,000 mcg by mouth daily.     ACCU-CHEK GUIDE test strip USE UP TO FOUR TIMES DAILY AS DIRECTED (Patient not taking: Reported on 07/21/2023) 100 strip 1   Accu-Chek Softclix Lancets lancets USE TO TEST BLOOD SUGAR UP TO FOUR TIMES DAILY AS DIRECTED (Patient not taking: Reported on 07/21/2023) 100 each 1   blood glucose meter kit and supplies Dispense based on patient and insurance preference. Use up to four times daily as directed. (FOR ICD-10 E10.9, E11.9). (Patient not taking: Reported on 07/21/2023) 1 each 0   No facility-administered medications prior to visit.    Allergies  Allergen Reactions   Atorvastatin Other (See Comments)    Statins cause headaches and muscle aches    ROS Review of Systems  All other systems reviewed and are negative.     Objective:    Physical Exam Constitutional:      Appearance: Normal appearance.  HENT:     Head: Normocephalic and atraumatic.  Eyes:     Conjunctiva/sclera: Conjunctivae normal.  Cardiovascular:     Rate and Rhythm: Normal rate and regular rhythm.  Pulmonary:     Effort: Pulmonary effort is normal.     Breath sounds: Normal breath sounds.  Skin:    General: Skin is warm and dry.  Neurological:     General: No focal deficit present.     Mental Status: He is alert. Mental status is at baseline.  Psychiatric:        Mood and Affect: Mood normal.  Behavior: Behavior normal.     BP 132/84 (Cuff Size: Large)   Pulse 81   Temp 97.8 F (36.6 C) (Oral)   Resp 16   Ht 6' 1.5 (1.867 m)   Wt 259  lb (117.5 kg)   BMI 33.71 kg/m  Wt Readings from Last 3 Encounters:  12/20/23 259 lb (117.5 kg)  11/22/23 255 lb 9.6 oz (115.9 kg)  07/21/23 263 lb 12.8 oz (119.7 kg)     Health Maintenance Due  Topic Date Due   Zoster Vaccines- Shingrix (1 of 2) Never done   COVID-19 Vaccine (1 - 2024-25 season) Never done   OPHTHALMOLOGY EXAM  11/16/2023   INFLUENZA VACCINE  12/16/2023     There are no preventive care reminders to display for this patient.  Lab Results  Component Value Date   TSH 1.920 01/05/2021   Lab Results  Component Value Date   WBC 6.3 07/21/2023   HGB 13.8 07/21/2023   HCT 42.6 07/21/2023   MCV 86 07/21/2023   PLT 266 07/21/2023   Lab Results  Component Value Date   NA 137 07/21/2023   K 4.8 07/21/2023   CO2 21 07/21/2023   GLUCOSE 199 (H) 07/21/2023   BUN 18 07/21/2023   CREATININE 1.16 07/21/2023   BILITOT 0.5 07/21/2023   ALKPHOS 90 07/21/2023   AST 25 07/21/2023   ALT 17 07/21/2023   PROT 7.4 07/21/2023   ALBUMIN 4.4 07/21/2023   CALCIUM  9.8 07/21/2023   ANIONGAP 11 01/08/2023   EGFR 69 07/21/2023   Lab Results  Component Value Date   CHOL 119 07/21/2023   Lab Results  Component Value Date   HDL 43 07/21/2023   Lab Results  Component Value Date   LDLCALC 61 07/21/2023   Lab Results  Component Value Date   TRIG 71 07/21/2023   Lab Results  Component Value Date   CHOLHDL 2.8 07/21/2023   Lab Results  Component Value Date   HGBA1C 10.0 (A) 11/22/2023      Assessment & Plan:   Assessment & Plan Type 2 diabetes mellitus Suboptimal glycemic control with average blood glucose 200-250 mg/dL, estimated J8r 1.4-0%. Previous A1c was 10%. Insulin  therapy initiated, plan to increase dose for better control. Glipizide  effective but not refilled due to pharmacy issues. - Refill glipizide , send prescription to [facility]. - Refill Mounjaro , send prescription to [facility]. - Increase insulin  dose from 10 units to 13 units. - Monitor blood  glucose, adjust insulin  dose as needed. - Prescribe Freestyle Libre 3 Plus for continuous glucose monitoring. - Schedule follow-up A1c test mid-October.  Hypertension Well-controlled with blood pressure 132/84 mmHg on amlodipine  and metoprolol . - Refill amlodipine  and metoprolol  with refills.  - glipiZIDE  (GLUCOTROL ) 10 MG tablet; TAKE 1 TABLET BY MOUTH TWICE DAILY BEFORE A MEAL FOR HIGH SUGAR  Dispense: 180 tablet; Refill: 1 - tirzepatide  (MOUNJARO ) 10 MG/0.5ML Pen; Inject 10 mg into the skin once a week.  Dispense: 6 mL; Refill: 0 - Continuous Glucose Sensor (FREESTYLE LIBRE 3 PLUS SENSOR) MISC; Change sensor every 15 days.  Dispense: 6 each; Refill: 2 - metoprolol  tartrate (LOPRESSOR ) 25 MG tablet; TAKE 1 TABLET(25 MG) BY MOUTH TWICE DAILY  Dispense: 180 tablet; Refill: 1 - amLODipine  (NORVASC ) 5 MG tablet; Take 1 tablet (5 mg total) by mouth daily.  Dispense: 90 tablet; Refill: 1    Follow-up: Return in about 2 months (around 02/19/2024) for after 10/8 for A1c please.    Sharyle Fischer, DO

## 2023-12-23 ENCOUNTER — Other Ambulatory Visit: Payer: Self-pay | Admitting: Cardiovascular Disease

## 2023-12-23 DIAGNOSIS — I10 Essential (primary) hypertension: Secondary | ICD-10-CM

## 2023-12-23 NOTE — Telephone Encounter (Signed)
 Please contact pt for future appointment for future refill. Pt due for follow up.

## 2023-12-23 NOTE — Telephone Encounter (Signed)
 VM is full cannot leave message to schedule appt.

## 2024-01-05 ENCOUNTER — Encounter: Payer: Self-pay | Admitting: Internal Medicine

## 2024-01-05 DIAGNOSIS — E1165 Type 2 diabetes mellitus with hyperglycemia: Secondary | ICD-10-CM

## 2024-01-06 ENCOUNTER — Other Ambulatory Visit: Payer: Self-pay | Admitting: Internal Medicine

## 2024-01-06 DIAGNOSIS — E1165 Type 2 diabetes mellitus with hyperglycemia: Secondary | ICD-10-CM

## 2024-01-06 MED ORDER — DEXCOM G7 SENSOR MISC: 6 refills | Status: DC

## 2024-01-06 MED ORDER — DEXCOM G7 RECEIVER DEVI: 1.0000 | Freq: Every day | 0 refills | Status: DC

## 2024-01-06 MED ORDER — METFORMIN HCL ER 500 MG PO TB24: 500.0000 mg | ORAL_TABLET | Freq: Every day | ORAL | 1 refills | Status: DC

## 2024-01-06 MED ORDER — DEXCOM G7 RECEIVER DEVI: 1.0000 | Freq: Every day | 0 refills | Status: AC

## 2024-01-09 ENCOUNTER — Encounter: Payer: Self-pay | Admitting: Internal Medicine

## 2024-01-09 ENCOUNTER — Telehealth: Payer: Self-pay | Admitting: Cardiovascular Disease

## 2024-01-09 DIAGNOSIS — E1165 Type 2 diabetes mellitus with hyperglycemia: Secondary | ICD-10-CM

## 2024-01-09 MED ORDER — INSULIN GLARGINE 100 UNITS/ML SOLOSTAR PEN: 10.0000 [IU] | PEN_INJECTOR | Freq: Every day | SUBCUTANEOUS | 1 refills | Status: DC

## 2024-01-09 NOTE — Telephone Encounter (Signed)
 Primary Cardiologist:Muhammad Darron, MD  Chart reviewed as part of pre-operative protocol coverage. Because of ELUZER HOWDESHELL Sr.'s past medical history and time since last visit, he/she will require a follow-up visit in order to better assess preoperative cardiovascular risk.  Pre-op covering staff: - Please schedule appointment and call patient to inform them. - Please contact requesting surgeon's office via preferred method (i.e, phone, fax) to inform them of need for appointment prior to surgery.  Pt is on Eliquis  for PAF. Request for preop callback team to notify requesting office since they did not request that pt hold OAC for upcoming procedure.   Rosaline EMERSON Bane, NP-C  01/09/2024, 4:26 PM [address], [address] [place], [place] [address] Office [phone] Fax [phone]

## 2024-01-09 NOTE — Telephone Encounter (Signed)
   Pre-operative Risk Assessment    Patient Name: Marvin Duncan  DOB: November 11, 1955 MRN: [id number]   Date of last office visit: unknown Date of next office visit: unknown   Request for Surgical Clearance    Procedure:  R shoulder arthroscopic debridement, DCE< mini RCR  Date of Surgery:  Clearance 01/23/24                                Surgeon:   Dr. MARLA Cranker Surgeon's Group or Practice Name:  [facility] Phone number:  [phone] Fax number:  [phone]   Type of Clearance Requested:   - Medical    Type of Anesthesia:  Not Indicated   Additional requests/questions:    SignedBerwyn LELON Sprung   01/09/2024, 3:28 PM

## 2024-01-10 NOTE — Telephone Encounter (Signed)
 Tried contacting requesting office to ask if Eliquis  is needing to be held no answer left a detailed vm to call back and contacted patient to schedule IN OFFICE visit no answer left a detailed vm to call back and schedule

## 2024-01-10 NOTE — Telephone Encounter (Signed)
 Requesting office called back and they confirmed that patient does need to hold Eliquis 

## 2024-01-10 NOTE — Telephone Encounter (Signed)
 Surgery center calling back to confirm that pt needs to hold Eliquis  prior to surgery

## 2024-01-12 ENCOUNTER — Encounter: Payer: Self-pay | Admitting: Internal Medicine

## 2024-01-13 ENCOUNTER — Other Ambulatory Visit: Payer: Self-pay | Admitting: Internal Medicine

## 2024-01-13 ENCOUNTER — Other Ambulatory Visit: Payer: Self-pay | Admitting: Emergency Medicine

## 2024-01-13 ENCOUNTER — Telehealth: Payer: Self-pay

## 2024-01-13 DIAGNOSIS — E1165 Type 2 diabetes mellitus with hyperglycemia: Secondary | ICD-10-CM

## 2024-01-13 MED ORDER — INSULIN GLARGINE 100 UNITS/ML SOLOSTAR PEN: 10.0000 [IU] | PEN_INJECTOR | Freq: Every day | SUBCUTANEOUS | 1 refills | Status: DC

## 2024-01-13 NOTE — Telephone Encounter (Signed)
 Medication pend to you. Only need enough to last til mail order comes

## 2024-01-13 NOTE — Telephone Encounter (Signed)
 Copied from CRM [id number]. Topic: Clinical - Medication Question >> Jan 13, 2024 12:46 PM Myrick T wrote: Reason for CRM: patient called to see if he could get the Lantus  pen until [facility] can get his script filled as he is out of insulin . He would need it sent to [id number] [place], [place] - [address] AT [facility] [address]  Phone: [phone] Fax: [phone]  Pleaes f/u with patient

## 2024-01-19 ENCOUNTER — Ambulatory Visit: Attending: Cardiology | Admitting: Cardiology

## 2024-01-19 ENCOUNTER — Encounter: Payer: Self-pay | Admitting: Cardiology

## 2024-01-19 VITALS — BP 140/78 | HR 66 | Ht 73.0 in | Wt 262.0 lb

## 2024-01-19 DIAGNOSIS — I10 Essential (primary) hypertension: Secondary | ICD-10-CM

## 2024-01-19 DIAGNOSIS — Z01810 Encounter for preprocedural cardiovascular examination: Secondary | ICD-10-CM

## 2024-01-19 DIAGNOSIS — I480 Paroxysmal atrial fibrillation: Secondary | ICD-10-CM | POA: Diagnosis not present

## 2024-01-19 DIAGNOSIS — E785 Hyperlipidemia, unspecified: Secondary | ICD-10-CM

## 2024-01-19 DIAGNOSIS — E1165 Type 2 diabetes mellitus with hyperglycemia: Secondary | ICD-10-CM

## 2024-01-19 DIAGNOSIS — G4733 Obstructive sleep apnea (adult) (pediatric): Secondary | ICD-10-CM

## 2024-01-19 NOTE — Progress Notes (Signed)
 Cardiology Office Note   Date:  01/19/2024  ID:  Marvin FORBES Daring Sr., DOB 05-26-1955, MRN [id number] PCP: Bernardo Fend, DO  [facility] Providers Cardiologist:  Deatrice Cage, MD     History of Present Illness Marvin Cervini. is a 68 y.o. male with a past medical history of type 2 diabetes, hypertension, hyperlipidemia, obesity, sleep apnea, atypical chest pain with prior normal stress testing (2016, 2017), paroxysmal atrial fibrillation, who is here today for follow-up also requesting a preprocedure cardiovascular examination.   In January 2016 the setting of chest pain and generalized cramps he was evaluated in the emergency department.  Stress testing was performed and showed no evidence of ischemia with an EF of 51%.  In December 2016 he again presented to the emergency department chest pain and normal troponins.  He was noted to have nonspecific ST and T wave changes in inferior lateral leads at that time.  He underwent repeat stress testing in January 2017, which was again low risk without evidence of ischemia or infarct.  Echocardiogram showed an LVEF of 55 to 60% with mild LVH and mild mitral regurgitation.  Throughout early 2022 he had intermittently noted palpitations. He was diagnosed with atrial fibrillation in August 2022 when he presented for a routine colonoscopy.  He was asymptomatic.  He was noted subsequently to be in sinus rhythm.  Echocardiogram in October 2022 showed normal LV systolic function with mild LVH and G1 DD.  Outpatient monitor showed no evidence of atrial fibrillation.   He was last seen in clinic 08/05/2022 by Dr. Cage.  At that time he had overall been doing well from a cardiac perspective without chest pain, shortness of breath or palpitations.  Diabetes control improved with a drop in his hemoglobin A1c to 7.5.  He was continued on apixaban  5 mg twice daily for CHA2DS2-VASc of at least 3 for stroke prophylaxis.  Aspirin was discontinued.  He was  continued on his current medication regimen for hypertension as his blood pressure was well-controlled.  There were no medication changes that were made and further testing that was ordered.  He returns to clinic today stating that he has been doing well from a cardiac perspective.  He denies any chest pain, shortness of breath, palpitations, peripheral edema.  States that he is getting ready to have hip surgery on his right shoulder which is why he requires surgical clearance today.  He states has been a little bit over a year since last time he was seen in clinic.  States that he has been compliant with his current medication regimen.  Denies missing any doses of his apixaban .  Also denies any bleeding with no blood noted in his urine or stool.  He has previously followed up with his PCP and had routine labs completed.  He also denies any hospitalizations or visits to the emergency department.  ROS: 10 point review of systems has been reviewed and considered negative except ones are listed in the HPI  Studies Reviewed EKG Interpretation Date/Time:  Thursday January 19 2024 11:27:32 EDT Ventricular Rate:  66 PR Interval:  198 QRS Duration:  86 QT Interval:  368 QTC Calculation: 385 R Axis:   2  Text Interpretation: Normal sinus rhythm Normal ECG When compared with ECG of 19-Dec-2020 11:02, Sinus rhythm has replaced Atrial fibrillation Confirmed by Gerard Frederick ([id number]) on 01/19/2024 11:28:37 AM    2D echo 02/25/2021  1. Left ventricular ejection fraction, by estimation, is 55 to 60%. The  left ventricle has normal function. The left ventricle has no regional  wall motion abnormalities. There is mild-moderate left ventricular  hypertrophy. Left ventricular diastolic  parameters are consistent with Grade I diastolic dysfunction (impaired  relaxation).   2. Right ventricular systolic function is normal. The right ventricular  size is normal.   3. The mitral valve is normal in structure. No  evidence of mitral valve  regurgitation.   4. The aortic valve is tricuspid. Aortic valve regurgitation is not  visualized.   Event monitor (Zio) 02/16/2021 Patient had a min HR of 51 bpm, max HR of 135 bpm, and avg HR of 80 bpm.  Predominant underlying rhythm was Sinus Rhythm. Rare PACs and rare PVCs. No significant arrhythmia.  2D echo 06/03/2015 Study Conclusions  - Procedure narrative: Transthoracic echocardiography. Image    quality was suboptimal. The study was technically difficult, as a    result of body habitus.  - Left ventricle: The cavity size was normal. There was mild    concentric hypertrophy. Systolic function was normal. The    estimated ejection fraction was in the range of 55% to 60%. Left    ventricular diastolic function parameters were normal.  - Mitral valve: There was mild regurgitation.  - Left atrium: The atrium was mildly dilated.  - Right ventricle: The cavity size was mildly dilated. Wall    thickness was normal.  - Pulmonary arteries: Systolic pressure could not be accurately    estimated.   Lexiscan MPI 05/27/2015 Exercise myocardial perfusion imaging study with no significant ischemia Normal wall motion, EF estimated at 38% (depressed ejection fraction likely secondary to attenuation artifact) No EKG changes concerning for ischemia.  Adequate exercise tolerance, target heart rate achieved (150 bpm, 92% maximum predicted heart rate Low risk scan Risk Assessment/Calculations  CHA2DS2-VASc Score = 4   This indicates a 4.8% annual risk of stroke. The patient's score is based upon: CHF History: 0 HTN History: 1 Diabetes History: 1 Stroke History: 0 Vascular Disease History: 1 Age Score: 1 Gender Score: 0    HYPERTENSION CONTROL Vitals:   01/19/24 1122 01/19/24 1136  BP: (!) 158/82 (!) 140/78    The patient's blood pressure is elevated above target today.  In order to address the patient's elevated BP: Blood pressure will be monitored at  home to determine if medication changes need to be made.          Physical Exam VS:  BP (!) 140/78 (BP Location: Left Arm, Patient Position: Sitting, Cuff Size: Large)   Pulse 66   Ht 6' 1 (1.854 m)   Wt 262 lb (118.8 kg)   SpO2 98%   BMI 34.57 kg/m        Wt Readings from Last 3 Encounters:  01/19/24 262 lb (118.8 kg)  12/20/23 259 lb (117.5 kg)  11/22/23 255 lb 9.6 oz (115.9 kg)    GEN: Well nourished, well developed in no acute distress NECK: No JVD; No carotid bruits CARDIAC: RRR, no murmurs, rubs, gallops RESPIRATORY:  Clear to auscultation without rales, wheezing or rhonchi  ABDOMEN: Soft, non-tender, non-distended EXTREMITIES:  No edema; No deformity   ASSESSMENT AND PLAN Paroxysmal atrial fibrillation where he stated to be in sinus rhythm on EKG today with a rate of 66 with no ischemic changes noted.  He denies any reoccurrence of atrial fibrillation and denies any palpitations.  States that he has been compliant with his apixaban .  He is continued on apixaban  5 mg twice daily for  CHA2DS2-VASc score of 4 for stroke prophylaxis.  He is also continued on metoprolol  tartrate 25 mg twice daily.  Primary hypertension with blood pressure today 115/82 and 140/78.  Patient states that he is always nervous when he comes into the office and blood pressure is elevated.  Per chart review blood pressures are running around the lower 130s.  He has been continued on his current medication regimen of amlodipine  5 mg daily, losartan  100 mg daily, and metoprolol  tartrate 25 mg twice daily.  He has been encouraged to continue to monitor his blood pressures at home as well 1 to 2 hours postmedication administration.  Hyperlipidemia with an LDL 61.  He has been continued on rosuvastatin  20 mg daily and omega-3 fatty acid 2000 mg daily.  Ongoing management per his PCP.  Obstructive sleep apnea on CPAP.  Type 2 diabetes with hemoglobin A1c of 10.  Patient states that it is improving since he has  been put on Lantus .  He has continued on insulin , Mounjaro , metformin , and glipizide .  Ongoing management per his PCP.  Preoperative cardiovascular examination    Marvin Duncan's perioperative risk of a major cardiac event is 0.9% according to the Revised Cardiac Risk Index (RCRI).  Therefore, he is at low risk for perioperative complications.   His functional capacity is good at 7.25 METs according to the Duke Activity Status Index (DASI). Recommendations: According to ACC/AHA guidelines, no further cardiovascular testing needed.  The patient may proceed to surgery at acceptable risk.   Antiplatelet and/or Anticoagulation Recommendations: Eliquis  (Apixaban ) can be held for 2 days prior to surgery.  Please resume post op when felt to be safe.         Dispo: Patient to return to clinic to see MD/APP in 11 to 12 months or sooner if needed for further evaluation.  Signed, Perley Arthurs, NP

## 2024-01-19 NOTE — Patient Instructions (Signed)
 Medication Instructions:   Your physician recommends that you continue on your current medications as directed. Please refer to the Current Medication list given to you today.   *If you need a refill on your cardiac medications before your next appointment, please call your pharmacy*  Lab Work:  No labs ordered today   If you have labs (blood work) drawn today and your tests are completely normal, you will receive your results only by: MyChart Message (if you have MyChart) OR A paper copy in the mail If you have any lab test that is abnormal or we need to change your treatment, we will call you to review the results.  Testing/Procedures:  No test ordered today   Follow-Up: At [facility], you and your health needs are our priority.  As part of our continuing mission to provide you with exceptional heart care, our providers are all part of one team.  This team includes your primary Cardiologist (physician) and Advanced Practice Providers or APPs (Physician Assistants and Nurse Practitioners) who all work together to provide you with the care you need, when you need it.  Your next appointment:    12 month(s)  Provider:   You may see Deatrice Cage, MD or one of the following Advanced Practice Providers on your designated Care Team:   Lonni Meager, NP Lesley Maffucci, PA-C Bernardino Bring, PA-C Cadence Hoquiam, PA-C Tylene Lunch, NP Barnie Hila, NP

## 2024-01-29 ENCOUNTER — Encounter: Payer: Self-pay | Admitting: Internal Medicine

## 2024-01-29 DIAGNOSIS — E1165 Type 2 diabetes mellitus with hyperglycemia: Secondary | ICD-10-CM

## 2024-01-30 MED ORDER — INSULIN GLARGINE 100 UNITS/ML SOLOSTAR PEN: 10.0000 [IU] | PEN_INJECTOR | Freq: Every day | SUBCUTANEOUS | 1 refills | Status: DC

## 2024-02-14 ENCOUNTER — Encounter: Payer: Self-pay | Admitting: Internal Medicine

## 2024-02-14 ENCOUNTER — Other Ambulatory Visit: Payer: Self-pay | Admitting: Internal Medicine

## 2024-02-14 DIAGNOSIS — E1165 Type 2 diabetes mellitus with hyperglycemia: Secondary | ICD-10-CM

## 2024-02-14 MED ORDER — METFORMIN HCL ER 500 MG PO TB24: 1000.0000 mg | ORAL_TABLET | Freq: Two times a day (BID) | ORAL | 1 refills | Status: DC

## 2024-02-15 NOTE — Telephone Encounter (Signed)
 Left message for patient to make sure he check with [facility] regarding medication call in

## 2024-02-24 ENCOUNTER — Other Ambulatory Visit: Payer: Self-pay

## 2024-02-24 ENCOUNTER — Ambulatory Visit: Admitting: Internal Medicine

## 2024-02-24 ENCOUNTER — Encounter: Payer: Self-pay | Admitting: Internal Medicine

## 2024-02-24 VITALS — BP 132/78 | HR 82 | Temp 97.7°F | Resp 16 | Ht 73.5 in | Wt 260.8 lb

## 2024-02-24 DIAGNOSIS — E1165 Type 2 diabetes mellitus with hyperglycemia: Secondary | ICD-10-CM | POA: Diagnosis not present

## 2024-02-24 DIAGNOSIS — G4700 Insomnia, unspecified: Secondary | ICD-10-CM

## 2024-02-24 DIAGNOSIS — E782 Mixed hyperlipidemia: Secondary | ICD-10-CM | POA: Diagnosis not present

## 2024-02-24 DIAGNOSIS — I700 Atherosclerosis of aorta: Secondary | ICD-10-CM

## 2024-02-24 DIAGNOSIS — I10 Essential (primary) hypertension: Secondary | ICD-10-CM | POA: Diagnosis not present

## 2024-02-24 DIAGNOSIS — Z794 Long term (current) use of insulin: Secondary | ICD-10-CM | POA: Diagnosis not present

## 2024-02-24 LAB — POCT GLYCOSYLATED HEMOGLOBIN (HGB A1C): Hemoglobin A1C: 8.7 % — AB (ref 4.0–5.6)

## 2024-02-24 MED ORDER — MOUNJARO 10 MG/0.5ML ~~LOC~~ SOAJ: 10.0000 mg | SUBCUTANEOUS | 0 refills | Status: DC

## 2024-02-24 MED ORDER — TRAZODONE HCL 100 MG PO TABS: ORAL_TABLET | ORAL | 1 refills | Status: DC

## 2024-02-24 MED ORDER — LOSARTAN POTASSIUM 100 MG PO TABS: ORAL_TABLET | ORAL | 1 refills | Status: DC

## 2024-02-24 MED ORDER — INSULIN GLARGINE 100 UNITS/ML SOLOSTAR PEN: 10.0000 [IU] | PEN_INJECTOR | Freq: Every day | SUBCUTANEOUS | 1 refills | Status: DC

## 2024-02-24 MED ORDER — ROSUVASTATIN CALCIUM 20 MG PO TABS: ORAL_TABLET | ORAL | 1 refills | Status: DC

## 2024-02-24 MED ORDER — METFORMIN HCL ER 500 MG PO TB24: 1000.0000 mg | ORAL_TABLET | Freq: Two times a day (BID) | ORAL | 1 refills | Status: DC

## 2024-02-24 NOTE — Progress Notes (Signed)
 Established Patient Office Visit  Subjective:  Patient ID: Marvin Duncan., male    DOB: 22-May-1955  Age: 68 y.o. MRN: [id number]  CC:  Chief Complaint  Patient presents with   Medical Management of Chronic Issues    HPI Marvin Duncan Sr. presents for follow up on chronic medical conditions.   Discussed the use of AI scribe software for clinical note transcription with the patient, who gave verbal consent to proceed.  History of Present Illness  Marvin Duncan. is a 68 year old male who presents for follow-up after shoulder surgery and diabetes management.  He is recovering from shoulder surgery performed in September, which limits his physical activity. He is undergoing physical therapy and sleeps well at night.  His A1c has improved from 10 in March and July to 8.7. He is on Lantus  insulin  at 10 units daily, metformin  500 mg twice daily, glipizide  twice daily, and Mounjaro  10 mg. He has been contacted about the Omnipod insulin  pump but has not started using it. He uses a Dexcom G7 for glucose monitoring and has experienced issues with the reader. He has reordered [facility] from [facility].  He takes amlodipine , Crestor , trazodone  nightly for sleep, and losartan . He has declined the flu and pneumonia vaccines, preferring vitamins and limited public exposure.   Diabetes, Type 2: -Last A1c 7/25 10.0% -Medications: Lantus  10 units, Glipizide  10 mg BID, Metformin  1000 mg XR twice daily and Mounjaro  10 mg weekly.   -Failed Meds: Jardiance  due to UTI's. had been on Trulicity   4.5 mg weekly but not available -Patient is compliant with the above medications and reports no side effects. -Diet: working on just drinking water, stopped juice, not changed. Eating one meal a day currently.  -Blood sugars: Has Dexcom G7 now -Eye exam: Due -Microalbumin: UTD -Foot exam: UTD -Statin: yes -PNA vaccine: Prevnar 13 and 23 in the past, politely declines Prevnar 20 -Denies symptoms of  hypoglycemia, polydipsia, numbness extremities, foot ulcers/trauma.   Past Medical History:  Diagnosis Date   Adrenal tumor 07/26/2018   Aortic atherosclerosis    a. 07/2018 CT chest: Atherosclerotic calcifications of the thoracic aorta.   Chest pain    a. 05/2014 Ex MV: no evidence of significant ischemia, GI uptake was noted, EF 51%, no ECG changes concerning for ischemia, normal study; b. 05/2015 Ex MV: No ischemia/infarct. EF 38% (2/2 atten artifact-->EF 55-60% by echo 05/2015).   Decreased libido    Diastolic dysfunction    a. 05/2015 Echo: EF 55-60%, mild LVH. Mild MR. Mildly dil LA/RV; b. 02/2021 Echo: EF 55-60%, no rwma, mild-mod LVH, GrI DD, nl RV size/fxn. No signif valvular dzs.   HTN (hypertension)    Hyperlipidemia    IDDM (insulin  dependent diabetes mellitus)    Obesity    PAF (paroxysmal atrial fibrillation) (HCC)    a. Dx 12/2020. CHA2DS2VASc = 2-3 (HTN/DM/Ao atherosclerosis); b. 02/2021 Zio: RSR, rare PACs/PVCs. No Afib or other significant arrhythmias.   Pharyngitis    Sleep apnea    uses CPAP    Past Surgical History:  Procedure Laterality Date   AV FISTULA REPAIR     CARDIAC CATHETERIZATION     [facility]    COLONOSCOPY  2013   COLONOSCOPY WITH PROPOFOL  N/A 12/19/2020   Procedure: COLONOSCOPY WITH PROPOFOL ;  Surgeon: Therisa Bi, MD;  Location: [facility] ENDOSCOPY;  Service: Gastroenterology;  Laterality: N/A;   COLONOSCOPY WITH PROPOFOL  N/A 03/15/2023   Procedure: COLONOSCOPY WITH PROPOFOL ;  Surgeon: Therisa Bi,  MD;  Location: [facility] ENDOSCOPY;  Service: Gastroenterology;  Laterality: N/A;    Family History  Problem Relation Age of Onset   Breast cancer Mother    Cancer Mother    Kidney failure Father    Diabetes Father    Alcohol abuse Father    Diabetes Sister    Diabetes Sister    Other Daughter     Social History   Socioeconomic History   Marital status: Married    Spouse name: Glenda   Number of children: 3   Years of education: Not on file   Highest  education level: Professional school degree (e.g., MD, DDS, DVM, JD)  Occupational History   Not on file  Tobacco Use   Smoking status: Former    Current packs/day: 0.00    Average packs/day: 0.3 packs/day for 25.0 years (6.3 ttl pk-yrs)    Types: Cigarettes    Start date: 05/17/1973    Quit date: 05/17/1998    Years since quitting: 25.7   Smokeless tobacco: Never   Tobacco comments:    off and on - maybe 5 cigarettes  Vaping Use   Vaping status: Never Used  Substance and Sexual Activity   Alcohol use: No    Alcohol/week: 0.0 standard drinks of alcohol    Comment: previously but not currently   Drug use: No   Sexual activity: Yes    Partners: Female  Other Topics Concern   Not on file  Social History Narrative   Not on file   Social Drivers of Health   Financial Resource Strain: Low Risk  (02/20/2024)   Overall Financial Resource Strain (CARDIA)    Difficulty of Paying Living Expenses: Not hard at all  Food Insecurity: No Food Insecurity (02/20/2024)   Hunger Vital Sign    Worried About Running Out of Food in the Last Year: Never true    Ran Out of Food in the Last Year: Never true  Transportation Needs: No Transportation Needs (02/20/2024)   PRAPARE - Administrator, Civil Service (Medical): No    Lack of Transportation (Non-Medical): No  Physical Activity: Unknown (02/20/2024)   Exercise Vital Sign    Days of Exercise per Week: 1 day    Minutes of Exercise per Session: Patient declined  Stress: No Stress Concern Present (02/20/2024)   [employer] of Occupational Health - Occupational Stress Questionnaire    Feeling of Stress: Not at all  Social Connections: Moderately Integrated (02/20/2024)   Social Connection and Isolation Panel    Frequency of Communication with Friends and Family: Once a week    Frequency of Social Gatherings with Friends and Family: Never    Attends Religious Services: More than 4 times per year    Active Member of [employer] or  Organizations: Yes    Attends Engineer, structural: More than 4 times per year    Marital Status: Married  Catering manager Violence: Not At Risk (10/16/2020)   Humiliation, Afraid, Rape, and Kick questionnaire    Fear of Current or Ex-Partner: No    Emotionally Abused: No    Physically Abused: No    Sexually Abused: No    Outpatient Medications Prior to Visit  Medication Sig Dispense Refill   amLODipine  (NORVASC ) 5 MG tablet Take 1 tablet (5 mg total) by mouth daily. 90 tablet 1   Cholecalciferol (VITAMIN D ) 2000 UNITS tablet Take 2,000 Units by mouth daily.     ELIQUIS  5 MG TABS tablet TAKE 1  TABLET(5 MG) BY MOUTH TWICE DAILY 180 tablet 1   Flaxseed, Linseed, (FLAXSEED OIL) 1000 MG CAPS Take 1,000 mg by mouth daily.      glipiZIDE  (GLUCOTROL ) 10 MG tablet TAKE 1 TABLET BY MOUTH TWICE DAILY BEFORE A MEAL FOR HIGH SUGAR 180 tablet 1   insulin  glargine (LANTUS ) 100 unit/mL SOPN Inject 10 Units into the skin daily. Total daily dose = 0.2-0.5 units/kg/day---> 50% Basal/50% Bolus 9 mL 1   losartan  (COZAAR ) 100 MG tablet TAKE 1 TABLET(100 MG) BY MOUTH DAILY 90 tablet 0   metFORMIN  (GLUCOPHAGE -XR) 500 MG 24 hr tablet Take 2 tablets (1,000 mg total) by mouth 2 (two) times daily with a meal. 360 tablet 1   metoprolol  tartrate (LOPRESSOR ) 25 MG tablet TAKE 1 TABLET(25 MG) BY MOUTH TWICE DAILY 60 tablet 0   Omega-3 Fatty Acids (FISH OIL) 1000 MG CAPS Take 1,000 mg by mouth daily.     rosuvastatin  (CRESTOR ) 20 MG tablet TAKE 1 TABLET(20 MG) BY MOUTH AT BEDTIME 90 tablet 0   tirzepatide  (MOUNJARO ) 10 MG/0.5ML Pen Inject 10 mg into the skin once a week. 6 mL 0   traZODone  (DESYREL ) 100 MG tablet TAKE 1 TABLET(100 MG) BY MOUTH AT BEDTIME 90 tablet 0   vitamin B-12 (CYANOCOBALAMIN) 1000 MCG tablet Take 1,000 mcg by mouth daily.     ACCU-CHEK GUIDE test strip USE UP TO FOUR TIMES DAILY AS DIRECTED (Patient not taking: Reported on 01/19/2024) 100 strip 1   Accu-Chek Softclix Lancets lancets USE TO  TEST BLOOD SUGAR UP TO FOUR TIMES DAILY AS DIRECTED (Patient not taking: Reported on 01/19/2024) 100 each 1   blood glucose meter kit and supplies Dispense based on patient and insurance preference. Use up to four times daily as directed. (FOR ICD-10 E10.9, E11.9). (Patient not taking: Reported on 01/19/2024) 1 each 0   Continuous Glucose Receiver (DEXCOM G7 RECEIVER) DEVI 1 each by Does not apply route daily. 1 each 0   Continuous Glucose Sensor (DEXCOM G7 SENSOR) MISC Change every 10 days. 3 each 6   Insulin  Pen Needle 31G X 5 MM MISC 1 each by Does not apply route daily. 100 each 1   No facility-administered medications prior to visit.    Allergies  Allergen Reactions   Atorvastatin Other (See Comments)    Statins cause headaches and muscle aches    ROS Review of Systems  All other systems reviewed and are negative.     Objective:    Physical Exam Constitutional:      Appearance: Normal appearance.  HENT:     Head: Normocephalic and atraumatic.  Eyes:     Conjunctiva/sclera: Conjunctivae normal.  Cardiovascular:     Rate and Rhythm: Normal rate and regular rhythm.  Pulmonary:     Effort: Pulmonary effort is normal.     Breath sounds: Normal breath sounds.  Skin:    General: Skin is warm and dry.  Neurological:     General: No focal deficit present.     Mental Status: He is alert. Mental status is at baseline.  Psychiatric:        Mood and Affect: Mood normal.        Behavior: Behavior normal.     BP 132/78 (Cuff Size: Large)   Pulse 82   Temp 97.7 F (36.5 C) (Oral)   Resp 16   Ht 6' 1.5 (1.867 m)   Wt 260 lb 12.8 oz (118.3 kg)   SpO2 99%   BMI 33.94 kg/m  Wt Readings from Last 3 Encounters:  02/24/24 260 lb 12.8 oz (118.3 kg)  01/19/24 262 lb (118.8 kg)  12/20/23 259 lb (117.5 kg)     Health Maintenance Due  Topic Date Due   Zoster Vaccines- Shingrix (1 of 2) Never done   OPHTHALMOLOGY EXAM  11/16/2023   Influenza Vaccine  12/16/2023   COVID-19  Vaccine (1 - 2025-26 season) Never done     There are no preventive care reminders to display for this patient.  Lab Results  Component Value Date   TSH 1.920 01/05/2021   Lab Results  Component Value Date   WBC 6.3 07/21/2023   HGB 13.8 07/21/2023   HCT 42.6 07/21/2023   MCV 86 07/21/2023   PLT 266 07/21/2023   Lab Results  Component Value Date   NA 137 07/21/2023   K 4.8 07/21/2023   CO2 21 07/21/2023   GLUCOSE 199 (H) 07/21/2023   BUN 18 07/21/2023   CREATININE 1.16 07/21/2023   BILITOT 0.5 07/21/2023   ALKPHOS 90 07/21/2023   AST 25 07/21/2023   ALT 17 07/21/2023   PROT 7.4 07/21/2023   ALBUMIN 4.4 07/21/2023   CALCIUM  9.8 07/21/2023   ANIONGAP 11 01/08/2023   EGFR 69 07/21/2023   Lab Results  Component Value Date   CHOL 119 07/21/2023   Lab Results  Component Value Date   HDL 43 07/21/2023   Lab Results  Component Value Date   LDLCALC 61 07/21/2023   Lab Results  Component Value Date   TRIG 71 07/21/2023   Lab Results  Component Value Date   CHOLHDL 2.8 07/21/2023   Lab Results  Component Value Date   HGBA1C 8.7 (A) 02/24/2024      Assessment & Plan:   Assessment & Plan Type 2 diabetes mellitus Type 2 diabetes mellitus with improved glycemic control. Hemoglobin A1c decreased to 8.7. Current insulin  regimen effective; Omnipod pump unnecessary due to cost and regimen efficacy. - Continue Lantus  insulin  at 10 units daily. - Refill metformin  500 mg extended release, two tablets twice a day. - Continue glipizide  twice a day. - Continue Mounjaro  at 10 mg. - Refill Maxis pens. - Refill prescriptions through [facility]; use local pharmacy if issues arise. - Plan to increase insulin  dose if needed.  Hypertension Hypertension managed with amlodipine  and losartan . - Continue amlodipine . - Continue losartan .  Hyperlipidemia Hyperlipidemia managed with Crestor . - Refill Crestor  prescription.  Insomnia Insomnia managed with trazodone . - Continue  trazodone  nightly.  - POCT HgB A1C - metFORMIN  (GLUCOPHAGE -XR) 500 MG 24 hr tablet; Take 2 tablets (1,000 mg total) by mouth 2 (two) times daily with a meal.  Dispense: 360 tablet; Refill: 1 - tirzepatide  (MOUNJARO ) 10 MG/0.5ML Pen; Inject 10 mg into the skin once a week.  Dispense: 6 mL; Refill: 0 - insulin  glargine (LANTUS ) 100 unit/mL SOPN; Inject 10 Units into the skin daily. Total daily dose = 0.2-0.5 units/kg/day---> 50% Basal/50% Bolus  Dispense: 9 mL; Refill: 1 - rosuvastatin  (CRESTOR ) 20 MG tablet; TAKE 1 TABLET(20 MG) BY MOUTH AT BEDTIME  Dispense: 90 tablet; Refill: 1 - traZODone  (DESYREL ) 100 MG tablet; TAKE 1 TABLET(100 MG) BY MOUTH AT BEDTIME  Dispense: 90 tablet; Refill: 1 - losartan  (COZAAR ) 100 MG tablet; TAKE 1 TABLET(100 MG) BY MOUTH DAILY  Dispense: 90 tablet; Refill: 1   Follow-up: Return in about 3 months (around 05/26/2024) for follow up on a1c.    Sharyle Fischer, DO

## 2024-02-28 ENCOUNTER — Encounter: Payer: Self-pay | Admitting: Internal Medicine

## 2024-03-01 ENCOUNTER — Encounter: Payer: Self-pay | Admitting: Internal Medicine

## 2024-03-01 ENCOUNTER — Other Ambulatory Visit: Payer: Self-pay | Admitting: Internal Medicine

## 2024-03-01 DIAGNOSIS — E1165 Type 2 diabetes mellitus with hyperglycemia: Secondary | ICD-10-CM

## 2024-03-06 ENCOUNTER — Other Ambulatory Visit: Payer: Self-pay | Admitting: Internal Medicine

## 2024-03-06 DIAGNOSIS — I10 Essential (primary) hypertension: Secondary | ICD-10-CM

## 2024-03-08 NOTE — Telephone Encounter (Signed)
 Too soon for refill, LRF 02/29/24 for 90 and 1 RF.  Requested Prescriptions  Pending Prescriptions Disp Refills   losartan  (COZAAR ) 100 MG tablet [Pharmacy Med Name: LOSARTAN  100MG  TABLETS] 90 tablet 1    Sig: TAKE 1 TABLET(100 MG) BY MOUTH DAILY     Cardiovascular:  Angiotensin Receptor Blockers Failed - 03/08/2024  8:40 AM      Failed - Cr in normal range and within 180 days    Creat  Date Value Ref Range Status  06/26/2018 1.15 0.70 - 1.25 mg/dL Final    Comment:    For patients >19 years of age, the reference limit for Creatinine is approximately 13% higher for people identified as African-American. .    Creatinine, Ser  Date Value Ref Range Status  07/21/2023 1.16 0.76 - 1.27 mg/dL Final   Creatinine, Urine  Date Value Ref Range Status  12/26/2015 113 20 - 370 mg/dL Final         Failed - K in normal range and within 180 days    Potassium  Date Value Ref Range Status  07/21/2023 4.8 3.5 - 5.2 mmol/L Final  04/22/2014 3.5 3.5 - 5.1 mmol/L Final         Passed - Patient is not pregnant      Passed - Last BP in normal range    BP Readings from Last 1 Encounters:  02/24/24 132/78         Passed - Valid encounter within last 6 months    Recent Outpatient Visits           1 week ago Type 2 diabetes mellitus with hyperglycemia, with long-term current use of insulin  [facility])   [place] [facility] Fend, DO   2 months ago Uncontrolled type 2 diabetes mellitus with hyperglycemia [facility])   [place] [facility]end, DO   3 months ago Uncontrolled type 2 diabetes mellitus with hyperglycemia [facility]   [place] [facility]end, DO   7 months ago Uncontrolled type 2 diabetes mellitus with hyperglycemia [facility])   [facility] [place]end, [place]

## 2024-03-20 MED ORDER — GLIPIZIDE 10 MG PO TABS: ORAL_TABLET | ORAL | 1 refills | Status: DC

## 2024-03-27 ENCOUNTER — Other Ambulatory Visit: Payer: Self-pay | Admitting: Internal Medicine

## 2024-03-27 DIAGNOSIS — I10 Essential (primary) hypertension: Secondary | ICD-10-CM

## 2024-03-29 NOTE — Telephone Encounter (Signed)
 Requested medication (s) are due for refill today: Yes  Requested medication (s) are on the active medication list: Yes  Last refill:    Future visit scheduled: Yes  Notes to clinic:  Last filled by Dr. Darron.    Requested Prescriptions  Pending Prescriptions Disp Refills   metoprolol  tartrate (LOPRESSOR ) 25 MG tablet [Pharmacy Med Name: Metoprolol  Tartrate 25 MG Oral Tablet] 180 tablet 3    Sig: TAKE 1 TABLET BY MOUTH TWICE  DAILY     Cardiovascular:  Beta Blockers Passed - 03/29/2024  3:12 PM      Passed - Last BP in normal range    BP Readings from Last 1 Encounters:  02/24/24 132/78         Passed - Last Heart Rate in normal range    Pulse Readings from Last 1 Encounters:  02/24/24 82         Passed - Valid encounter within last 6 months    Recent Outpatient Visits           1 month ago Type 2 diabetes mellitus with hyperglycemia, with long-term current use of insulin  [facility])   [facility] Bernardo Fend, DO   3 months ago Uncontrolled type 2 diabetes mellitus with hyperglycemia [facility])   [place] [facility] Fend, DO   4 months ago Uncontrolled type 2 diabetes mellitus with hyperglycemia [facility])   [place] [facility] Bernardo Fend, DO   8 months ago Uncontrolled type 2 diabetes mellitus with hyperglycemia [facility])   [facility] [place]end, [place]

## 2024-04-29 ENCOUNTER — Other Ambulatory Visit: Payer: Self-pay | Admitting: Cardiovascular Disease

## 2024-04-29 DIAGNOSIS — I480 Paroxysmal atrial fibrillation: Secondary | ICD-10-CM

## 2024-04-30 NOTE — Telephone Encounter (Signed)
 Prescription refill request for Eliquis  received. Indication:afib Last office visit:9/25 Scr: 1.16  3/25 Age:68 Weight:118.3  kg  Prescription refilled

## 2024-05-02 ENCOUNTER — Other Ambulatory Visit: Payer: Self-pay | Admitting: Internal Medicine

## 2024-05-02 DIAGNOSIS — E1165 Type 2 diabetes mellitus with hyperglycemia: Secondary | ICD-10-CM

## 2024-05-04 NOTE — Telephone Encounter (Signed)
 Requested medication (s) are due for refill today-no   Requested medication (s) are on the active medication list -yes  Future visit scheduled -yes  Last refill: 02/24/24 6ml  Notes to clinic: off protocol- provider review   Requested Prescriptions  Pending Prescriptions Disp Refills   MOUNJARO  10 MG/0.5ML Pen [Pharmacy Med Name: MOUNJARO  PEN 10MG /0.5ML] 6 mL 3    Sig: INJECT THE CONTENTS OF ONE PEN  SUBCUTANEOUSLY WEEKLY AS  DIRECTED     Off-Protocol Failed - 05/04/2024  2:05 PM      Failed - Medication not assigned to a protocol, review manually.      Passed - Valid encounter within last 12 months    Recent Outpatient Visits           2 months ago Type 2 diabetes mellitus with hyperglycemia, with long-term current use of insulin  [facility])   [facility]end, DO   4 months ago Uncontrolled type 2 diabetes mellitus with hyperglycemia [facility])   [place] [facility] Bernardo Fend, DO   5 months ago Uncontrolled type 2 diabetes mellitus with hyperglycemia [facility])   [facility] Bern[facility] Fend, DO   9 months ago Uncontrolled type 2 diabetes mellitus with hyperglycemia [facility])   [place] [facility] Bernardo Fend, DO                 Requested Prescriptions  Pending Prescriptions Disp Refills   MOUNJARO  10 MG/0.5ML Pen [Pharmacy Med Name: MOUNJARO  PEN 10MG /0.5ML] 6 mL 3    Sig: INJECT THE CONTENTS OF ONE PEN  SUBCUTANEOUSLY WEEKLY AS  DIRECTED     Off-Protocol Failed - 05/04/2024  2:05 PM      Failed - Medication not assigned to a protocol, review manually.      Passed - Valid encounter within last 12 months    Recent Outpatient Visits           2 months ago Type 2 diabetes mellitus with hyperglycemia, with long-term current use of insulin  [facility])   [place] [facility]end, DO   4 months ago Uncontrolled type 2 diabetes mellitus  with hyperglycemia [facility])   [facility], DO   5 months ago Uncontrolled type 2 diabetes mellitus with hyperglycemia [facility])   [facility] Bern[facility] Fend, DO   9 months ago Uncontrolled type 2 diabetes mellitus with hyperglycemia [facility])   [facility] Bern[place] Fend, [place]

## 2024-05-05 ENCOUNTER — Other Ambulatory Visit: Payer: Self-pay | Admitting: Internal Medicine

## 2024-05-05 DIAGNOSIS — I10 Essential (primary) hypertension: Secondary | ICD-10-CM

## 2024-05-08 NOTE — Telephone Encounter (Signed)
 Requested medication (s) are due for refill today- unsure  Requested medication (s) are on the active medication list -yes  Future visit scheduled -yes  Last refill: 12/23/23 #60  Notes to clinic: outside provider- sent for review   Requested Prescriptions  Pending Prescriptions Disp Refills   metoprolol  tartrate (LOPRESSOR ) 25 MG tablet [Pharmacy Med Name: Metoprolol  Tartrate 25 MG Oral Tablet] 180 tablet 3    Sig: TAKE 1 TABLET BY MOUTH TWICE  DAILY     Cardiovascular:  Beta Blockers Passed - 05/08/2024  4:31 PM      Passed - Last BP in normal range    BP Readings from Last 1 Encounters:  02/24/24 132/78         Passed - Last Heart Rate in normal range    Pulse Readings from Last 1 Encounters:  02/24/24 82         Passed - Valid encounter within last 6 months    Recent Outpatient Visits           2 months ago Type 2 diabetes mellitus with hyperglycemia, with long-term current use of insulin  [facility])   [facility]end, DO   4 months ago Uncontrolled type 2 diabetes mellitus with hyperglycemia [facility])   [facility]end, DO   5 months ago Uncontrolled type 2 diabetes mellitus with hyperglycemia [facility])   [place] [facility] Bernardo Fend, DO   9 months ago Uncontrolled type 2 diabetes mellitus with hyperglycemia [facility])   [place] [facility] Bernardo Fend, DO                 Requested Prescriptions  Pending Prescriptions Disp Refills   metoprolol  tartrate (LOPRESSOR ) 25 MG tablet [Pharmacy Med Name: Metoprolol  Tartrate 25 MG Oral Tablet] 180 tablet 3    Sig: TAKE 1 TABLET BY MOUTH TWICE  DAILY     Cardiovascular:  Beta Blockers Passed - 05/08/2024  4:31 PM      Passed - Last BP in normal range    BP Readings from Last 1 Encounters:  02/24/24 132/78         Passed - Last Heart Rate in normal range    Pulse Readings from Last 1 Encounters:   02/24/24 82         Passed - Valid encounter within last 6 months    Recent Outpatient Visits           2 months ago Type 2 diabetes mellitus with hyperglycemia, with long-term current use of insulin  [facility])   [place] [facility] Bern[facility] Fend, DO   4 months ago Uncontrolled type 2 diabetes mellitus with hyperglycemia [facility])   [place] [facility] Bernardo Fend, DO   5 months ago Uncontrolled type 2 diabetes mellitus with hyperglycemia [facility])   [facility] Bern[facility] Fend, DO   9 months ago Uncontrolled type 2 diabetes mellitus with hyperglycemia [facility])   [facility] Bernardo Fend, [place]

## 2024-05-29 ENCOUNTER — Encounter: Payer: Self-pay | Admitting: Internal Medicine

## 2024-05-29 ENCOUNTER — Ambulatory Visit: Admitting: Internal Medicine

## 2024-05-29 ENCOUNTER — Other Ambulatory Visit: Payer: Self-pay

## 2024-05-29 VITALS — BP 134/78 | HR 72 | Temp 97.8°F | Resp 16 | Ht 73.5 in | Wt 264.5 lb

## 2024-05-29 DIAGNOSIS — E1165 Type 2 diabetes mellitus with hyperglycemia: Secondary | ICD-10-CM

## 2024-05-29 DIAGNOSIS — I10 Essential (primary) hypertension: Secondary | ICD-10-CM

## 2024-05-29 DIAGNOSIS — G4700 Insomnia, unspecified: Secondary | ICD-10-CM

## 2024-05-29 DIAGNOSIS — E782 Mixed hyperlipidemia: Secondary | ICD-10-CM

## 2024-05-29 DIAGNOSIS — Z794 Long term (current) use of insulin: Secondary | ICD-10-CM

## 2024-05-29 DIAGNOSIS — I700 Atherosclerosis of aorta: Secondary | ICD-10-CM

## 2024-05-29 DIAGNOSIS — I480 Paroxysmal atrial fibrillation: Secondary | ICD-10-CM

## 2024-05-29 LAB — POCT GLYCOSYLATED HEMOGLOBIN (HGB A1C): Hemoglobin A1C: 7.1 % — AB (ref 4.0–5.6)

## 2024-05-29 MED ORDER — TRAZODONE HCL 100 MG PO TABS: ORAL_TABLET | ORAL | 1 refills | Status: AC

## 2024-05-29 MED ORDER — AMLODIPINE BESYLATE 5 MG PO TABS: 5.0000 mg | ORAL_TABLET | Freq: Every day | ORAL | 1 refills | Status: AC

## 2024-05-29 MED ORDER — MOUNJARO 10 MG/0.5ML ~~LOC~~ SOAJ: 10.0000 mg | SUBCUTANEOUS | 1 refills | Status: AC

## 2024-05-29 MED ORDER — DEXCOM G7 SENSOR MISC: 6 refills | Status: AC

## 2024-05-29 MED ORDER — GLIPIZIDE 10 MG PO TABS: ORAL_TABLET | ORAL | 1 refills | Status: AC

## 2024-05-29 MED ORDER — INSULIN GLARGINE 100 UNITS/ML SOLOSTAR PEN: 10.0000 [IU] | PEN_INJECTOR | Freq: Every day | SUBCUTANEOUS | 1 refills | Status: AC

## 2024-05-29 MED ORDER — ROSUVASTATIN CALCIUM 20 MG PO TABS: ORAL_TABLET | ORAL | 1 refills | Status: AC

## 2024-05-29 MED ORDER — LOSARTAN POTASSIUM 100 MG PO TABS: ORAL_TABLET | ORAL | 1 refills | Status: AC

## 2024-05-29 MED ORDER — METFORMIN HCL ER 500 MG PO TB24: 1000.0000 mg | ORAL_TABLET | Freq: Two times a day (BID) | ORAL | 1 refills | Status: AC

## 2024-05-29 MED ORDER — APIXABAN 5 MG PO TABS: 5.0000 mg | ORAL_TABLET | Freq: Two times a day (BID) | ORAL | 1 refills | Status: AC

## 2024-05-29 MED ORDER — INSULIN PEN NEEDLE 31G X 5 MM MISC: 1.0000 | Freq: Every day | 1 refills | Status: AC

## 2024-05-29 NOTE — Progress Notes (Signed)
 "  Established Patient Office Visit  Subjective:  Patient ID: Marvin Duncan., male    DOB: 07/15/1955  Age: 69 y.o. MRN: [id number]  CC:  Chief Complaint  Patient presents with   Medical Management of Chronic Issues    3 month recheck    HPI Marvin MCFARLAN Sr. presents for follow up on chronic medical conditions.   Discussed the use of AI scribe software for clinical note transcription with the patient, who gave verbal consent to proceed.  History of Present Illness  Marvin Duncan. is a 69 year old male with diabetes who presents for diabetes management and medication refills.  His A1c is 7.1, the best it has been, which he links to fasting during Ramadan and intermittent fasting. He eats small amounts during the day if his blood sugar drops below about 110 to prevent symptoms, as larger meals raise his glucose.  He is on Mounjaro  10 mg and is satisfied with this regimen. He uses Lantus  10 units with Ultrafine needles and has two pens left. He takes glipizide  twice daily with metformin  and has not had daytime hypoglycemia.  He uses a Dexcom G7 continuous glucose monitor and prefers to obtain it and Eliquis  5 mg twice daily through [facility] due to cost. He also takes trazodone , metoprolol , amlodipine , Crestor , and losartan , and reports adequate supply of metoprolol .   Diabetes, Type 2: -Last A1c 10/25 8.7% -Medications: Lantus  10 units, Glipizide  10 mg BID, Metformin  1000 mg XR twice daily and Mounjaro  10 mg weekly.   -Failed Meds: Jardiance  due to UTI's. had been on Trulicity   4.5 mg weekly but not available -Patient is compliant with the above medications and reports no side effects. -Diet: working on just drinking water, stopped juice, not changed. Eating one meal a day currently. Does fasting occasionally  -Blood sugars: Has Dexcom G7 now -Eye exam: Due -Microalbumin: UTD -Foot exam: UTD -Statin: yes -PNA vaccine: Prevnar 13 and 23 in the past, politely declines  Prevnar 20 -Denies symptoms of hypoglycemia, polydipsia, numbness extremities, foot ulcers/trauma.   A.Fib: -Currently on Lopressor  25 mg, Eliquis  5 mg BID -Compliant with above medications and denies adverse effects, denies abnormal bleeding.  -Following with Cardiology   Hypertension/OSA: -Medications: Amlodipine  5 mg, Losartan  100 mg, Lopressor  25 mg -Patient is compliant with above medications and reports no side effects. -Checking BP at home (average): not checking  -Denies any SOB, CP, vision changes, LE edema or symptoms of hypotension -Compliant with CPAP   HLD: -Medications: Crestor  20 mg -Patient is compliant with above medications and reports no side effects.  -Last lipid panel: Lipid Panel     Component Value Date/Time   CHOL 119 07/21/2023 0943   TRIG 71 07/21/2023 0943   HDL 43 07/21/2023 0943   CHOLHDL 2.8 07/21/2023 0943   CHOLHDL 3.4 06/26/2018 0818   LDLCALC 61 07/21/2023 0943   LDLCALC 83 06/26/2018 0818   LABVLDL 15 07/21/2023 0943     Health Maintenance:  -Blood work UTD -Colon cancer screening: colonoscopy 10/24, repeat in 10 years   Past Medical History:  Diagnosis Date   Adrenal tumor 07/26/2018   Aortic atherosclerosis    a. 07/2018 CT chest: Atherosclerotic calcifications of the thoracic aorta.   Chest pain    a. 05/2014 Ex MV: no evidence of significant ischemia, GI uptake was noted, EF 51%, no ECG changes concerning for ischemia, normal study; b. 05/2015 Ex MV: No ischemia/infarct. EF 38% (2/2 atten artifact-->EF 55-60%  by echo 05/2015).   Decreased libido    Diastolic dysfunction    a. 05/2015 Echo: EF 55-60%, mild LVH. Mild MR. Mildly dil LA/RV; b. 02/2021 Echo: EF 55-60%, no rwma, mild-mod LVH, GrI DD, nl RV size/fxn. No signif valvular dzs.   HTN (hypertension)    Hyperlipidemia    IDDM (insulin  dependent diabetes mellitus)    Obesity    PAF (paroxysmal atrial fibrillation) (HCC)    a. Dx 12/2020. CHA2DS2VASc = 2-3 (HTN/DM/Ao  atherosclerosis); b. 02/2021 Zio: RSR, rare PACs/PVCs. No Afib or other significant arrhythmias.   Pharyngitis    Sleep apnea    uses CPAP    Past Surgical History:  Procedure Laterality Date   AV FISTULA REPAIR     CARDIAC CATHETERIZATION     [facility]    COLONOSCOPY  2013   COLONOSCOPY WITH PROPOFOL  N/A 12/19/2020   Procedure: COLONOSCOPY WITH PROPOFOL ;  Surgeon: Therisa Bi, MD;  [facility];  Service: Gastroenterology;  Laterality: N/A;   COLONOSCOPY WITH PROPOFOL  N/A 03/15/2023   Procedure: COLONOSCOPY WITH PROPOFOL ;  Surgeon: Therisa Bi, MD;  Location: [facility] ENDOSCOPY;  Service: Gastroenterology;  Laterality: N/A;    Family History  Problem Relation Age of Onset   Breast cancer Mother    Cancer Mother    Kidney failure Father    Diabetes Father    Alcohol abuse Father    Diabetes Sister    Diabetes Sister    Other Daughter     Social History   Socioeconomic History   Marital status: Married    Spouse name: Glenda   Number of children: 3   Years of education: Not on file   Highest education level: Professional school degree (e.g., MD, DDS, DVM, JD)  Occupational History   Not on file  Tobacco Use   Smoking status: Former    Current packs/day: 0.00    Average packs/day: 0.3 packs/day for 25.0 years (6.3 ttl pk-yrs)    Types: Cigarettes    Start date: 05/17/1973    Quit date: 05/17/1998    Years since quitting: 26.0   Smokeless tobacco: Never   Tobacco comments:    off and on - maybe 5 cigarettes  Vaping Use   Vaping status: Never Used  Substance and Sexual Activity   Alcohol use: No    Alcohol/week: 0.0 standard drinks of alcohol    Comment: previously but not currently   Drug use: No   Sexual activity: Yes    Partners: Female  Other Topics Concern   Not on file  Social History Narrative   Not on file   Social Drivers of Health   Tobacco Use: Medium Risk (05/29/2024)   Patient History    Smoking Tobacco Use: Former    Smokeless Tobacco Use:  Never    Passive Exposure: Not on Actuary Strain: Low Risk (02/20/2024)   Overall Financial Resource Strain (CARDIA)    Difficulty of Paying Living Expenses: Not hard at all  Food Insecurity: No Food Insecurity (02/20/2024)   Epic    Worried About Radiation Protection Practitioner of Food in the Last Year: Never true    Ran Out of Food in the Last Year: Never true  Transportation Needs: No Transportation Needs (02/20/2024)   Epic    Lack of Transportation (Medical): No    Lack of Transportation (Non-Medical): No  Physical Activity: Unknown (02/20/2024)   Exercise Vital Sign    Days of Exercise per Week: 1 day    Minutes  of Exercise per Session: Patient declined  Stress: No Stress Concern Present (02/20/2024)   [employer] of Occupational Health - Occupational Stress Questionnaire    Feeling of Stress: Not at all  Social Connections: Moderately Integrated (02/20/2024)   Social Connection and Isolation Panel    Frequency of Communication with Friends and Family: Once a week    Frequency of Social Gatherings with Friends and Family: Never    Attends Religious Services: More than 4 times per year    Active Member of Clubs or Organizations: Yes    Attends Banker Meetings: More than 4 times per year    Marital Status: Married  Catering Manager Violence: Not on file  Depression (PHQ2-9): Low Risk (05/29/2024)   Depression (PHQ2-9)    PHQ-2 Score: 0  Alcohol Screen: Low Risk (04/21/2023)   Alcohol Screen    Last Alcohol Screening Score (AUDIT): 0  Housing: Unknown (02/20/2024)   Epic    Unable to Pay for Housing in the Last Year: No    Number of Times Moved in the Last Year: Not on file    Homeless in the Last Year: No  Utilities: Not on file  Health Literacy: Not on file    Outpatient Medications Prior to Visit  Medication Sig Dispense Refill   amLODipine  (NORVASC ) 5 MG tablet Take 1 tablet (5 mg total) by mouth daily. 90 tablet 1   Cholecalciferol (VITAMIN D ) 2000 UNITS  tablet Take 2,000 Units by mouth daily.     ELIQUIS  5 MG TABS tablet TAKE 1 TABLET(5 MG) BY MOUTH TWICE DAILY 180 tablet 1   Flaxseed, Linseed, (FLAXSEED OIL) 1000 MG CAPS Take 1,000 mg by mouth daily.      glipiZIDE  (GLUCOTROL ) 10 MG tablet TAKE 1 TABLET BY MOUTH TWICE DAILY BEFORE A MEAL FOR HIGH SUGAR 180 tablet 1   insulin  glargine (LANTUS ) 100 unit/mL SOPN Inject 10 Units into the skin daily. Total daily dose = 0.2-0.5 units/kg/day---> 50% Basal/50% Bolus 9 mL 1   losartan  (COZAAR ) 100 MG tablet TAKE 1 TABLET(100 MG) BY MOUTH DAILY 90 tablet 1   metFORMIN  (GLUCOPHAGE -XR) 500 MG 24 hr tablet Take 2 tablets (1,000 mg total) by mouth 2 (two) times daily with a meal. 360 tablet 1   metoprolol  tartrate (LOPRESSOR ) 25 MG tablet TAKE 1 TABLET(25 MG) BY MOUTH TWICE DAILY 60 tablet 0   Omega-3 Fatty Acids (FISH OIL) 1000 MG CAPS Take 1,000 mg by mouth daily.     rosuvastatin  (CRESTOR ) 20 MG tablet TAKE 1 TABLET(20 MG) BY MOUTH AT BEDTIME 90 tablet 1   tirzepatide  (MOUNJARO ) 10 MG/0.5ML Pen Inject 10 mg into the skin once a week. 6 mL 0   traZODone  (DESYREL ) 100 MG tablet TAKE 1 TABLET(100 MG) BY MOUTH AT BEDTIME 90 tablet 1   vitamin B-12 (CYANOCOBALAMIN) 1000 MCG tablet Take 1,000 mcg by mouth daily.     Continuous Glucose Receiver (DEXCOM G7 RECEIVER) DEVI 1 each by Does not apply route daily. 1 each 0   Continuous Glucose Sensor (DEXCOM G7 SENSOR) MISC Change every 10 days. 3 each 6   Insulin  Pen Needle 31G X 5 MM MISC 1 each by Does not apply route daily. 100 each 1   predniSONE  (STERAPRED UNI-PAK 48 TAB) 10 MG (48) TBPK tablet Take by mouth daily. (Patient not taking: Reported on 05/29/2024)     No facility-administered medications prior to visit.    Allergies  Allergen Reactions   Atorvastatin Other (See Comments)  Statins cause headaches and muscle aches    ROS Review of Systems  All other systems reviewed and are negative.     Objective:    Physical Exam Constitutional:       Appearance: Normal appearance.  HENT:     Head: Normocephalic and atraumatic.  Eyes:     Conjunctiva/sclera: Conjunctivae normal.  Cardiovascular:     Rate and Rhythm: Normal rate and regular rhythm.  Pulmonary:     Effort: Pulmonary effort is normal.     Breath sounds: Normal breath sounds.  Skin:    General: Skin is warm and dry.  Neurological:     General: No focal deficit present.     Mental Status: He is alert. Mental status is at baseline.  Psychiatric:        Mood and Affect: Mood normal.        Behavior: Behavior normal.     BP 134/78 (Cuff Size: Large)   Pulse 72   Temp 97.8 F (36.6 C) (Oral)   Resp 16   Ht 6' 1.5 (1.867 m)   Wt 264 lb 8 oz (120 kg)   SpO2 97%   BMI 34.42 kg/m  Wt Readings from Last 3 Encounters:  05/29/24 264 lb 8 oz (120 kg)  02/24/24 260 lb 12.8 oz (118.3 kg)  01/19/24 262 lb (118.8 kg)     Health Maintenance Due  Topic Date Due   Zoster Vaccines- Shingrix (1 of 2) Never done   Pneumococcal Vaccine: 50+ Years (3 of 3 - PCV20 or PCV21) 08/30/2018   OPHTHALMOLOGY EXAM  11/16/2023   COVID-19 Vaccine (1 - 2025-26 season) Never done     There are no preventive care reminders to display for this patient.  Lab Results  Component Value Date   TSH 1.920 01/05/2021   Lab Results  Component Value Date   WBC 6.3 07/21/2023   HGB 13.8 07/21/2023   HCT 42.6 07/21/2023   MCV 86 07/21/2023   PLT 266 07/21/2023   Lab Results  Component Value Date   NA 137 07/21/2023   K 4.8 07/21/2023   CO2 21 07/21/2023   GLUCOSE 199 (H) 07/21/2023   BUN 18 07/21/2023   CREATININE 1.16 07/21/2023   BILITOT 0.5 07/21/2023   ALKPHOS 90 07/21/2023   AST 25 07/21/2023   ALT 17 07/21/2023   PROT 7.4 07/21/2023   ALBUMIN 4.4 07/21/2023   CALCIUM  9.8 07/21/2023   ANIONGAP 11 01/08/2023   EGFR 69 07/21/2023   Lab Results  Component Value Date   CHOL 119 07/21/2023   Lab Results  Component Value Date   HDL 43 07/21/2023   Lab Results   Component Value Date   LDLCALC 61 07/21/2023   Lab Results  Component Value Date   TRIG 71 07/21/2023   Lab Results  Component Value Date   CHOLHDL 2.8 07/21/2023   Lab Results  Component Value Date   HGBA1C 7.1 (A) 05/29/2024      Assessment & Plan:   Assessment & Plan  Type 2 diabetes mellitus w/Hyperglycemia  Well-controlled with A1c of 7.1. Effective management with intermittent fasting during Ramadan. Hypoglycemic episodes reported during fasting. Aware to hold glipizide  if hypoglycemia occurs. - Continue Mounjaro  at current dose. - Refilled Lantus  and provided Ultrafine needles. - Refilled metformin  and glipizide . - Refilled Dexcom G7 sensors. - Advised to hold glipizide  if experiencing hypoglycemia.  Paroxysmal atrial fibrillation Managed with Eliquis  5 mg twice daily. Advised to obtain medication from [facility]  for cost-effectiveness. - Continue Eliquis  5 mg twice daily. - Sent Eliquis  prescription to [facility].  Essential hypertension Well-controlled with blood pressure of 134/78. On metoprolol , amlodipine , and losartan . - Continue current antihypertensive regimen.  Hyperlipidemia/Aortic Atherosclerosis - Stable, refill statin.   Insomnia Managed with trazodone , reported effective. - Continue trazodone .  General health maintenance Up to date with screenings. Labs due in March. - Scheduled follow-up appointment in three months for labs.  - POCT HgB A1C - Insulin  Pen Needle 31G X 5 MM MISC; 1 each by Does not apply route daily.  Dispense: 100 each; Refill: 1 - insulin  glargine (LANTUS ) 100 unit/mL SOPN; Inject 10 Units into the skin daily. Total daily dose = 0.2-0.5 units/kg/day---> 50% Basal/50% Bolus  Dispense: 9 mL; Refill: 1 - glipiZIDE  (GLUCOTROL ) 10 MG tablet; TAKE 1 TABLET BY MOUTH TWICE DAILY BEFORE A MEAL FOR HIGH SUGAR  Dispense: 180 tablet; Refill: 1 - Continuous Glucose Sensor (DEXCOM G7 SENSOR) MISC; Change every 10 days.  Dispense: 3 each; Refill:  6 - metFORMIN  (GLUCOPHAGE -XR) 500 MG 24 hr tablet; Take 2 tablets (1,000 mg total) by mouth 2 (two) times daily with a meal.  Dispense: 360 tablet; Refill: 1 - tirzepatide  (MOUNJARO ) 10 MG/0.5ML Pen; Inject 10 mg into the skin once a week.  Dispense: 6 mL; Refill: 1 - apixaban  (ELIQUIS ) 5 MG TABS tablet; Take 1 tablet (5 mg total) by mouth 2 (two) times daily.  Dispense: 180 tablet; Refill: 1 - losartan  (COZAAR ) 100 MG tablet; TAKE 1 TABLET(100 MG) BY MOUTH DAILY  Dispense: 90 tablet; Refill: 1 - amLODipine  (NORVASC ) 5 MG tablet; Take 1 tablet (5 mg total) by mouth daily.  Dispense: 90 tablet; Refill: 1 - rosuvastatin  (CRESTOR ) 20 MG tablet; TAKE 1 TABLET(20 MG) BY MOUTH AT BEDTIME  Dispense: 90 tablet; Refill: 1 - traZODone  (DESYREL ) 100 MG tablet; TAKE 1 TABLET(100 MG) BY MOUTH AT BEDTIME  Dispense: 90 tablet; Refill: 1   Follow-up: Return in about 3 months (around 08/27/2024) for follow up on a1c.    Sharyle Fischer, DO "

## 2024-08-29 ENCOUNTER — Ambulatory Visit: Admitting: Internal Medicine
